# Patient Record
Sex: Female | Born: 1955 | Race: White | Hispanic: No | Smoking: Never smoker
Health system: Southern US, Community
[De-identification: ages and names within clinical notes are randomized; demographics above are authoritative.]

## PROBLEM LIST (undated history)

## (undated) DIAGNOSIS — F32A Depression, unspecified: Secondary | ICD-10-CM

## (undated) DIAGNOSIS — F419 Anxiety disorder, unspecified: Secondary | ICD-10-CM

## (undated) DIAGNOSIS — E039 Hypothyroidism, unspecified: Secondary | ICD-10-CM

## (undated) DIAGNOSIS — R0989 Other specified symptoms and signs involving the circulatory and respiratory systems: Secondary | ICD-10-CM

## (undated) DIAGNOSIS — K759 Inflammatory liver disease, unspecified: Secondary | ICD-10-CM

## (undated) DIAGNOSIS — M109 Gout, unspecified: Secondary | ICD-10-CM

## (undated) DIAGNOSIS — F329 Major depressive disorder, single episode, unspecified: Secondary | ICD-10-CM

## (undated) DIAGNOSIS — R7303 Prediabetes: Secondary | ICD-10-CM

## (undated) DIAGNOSIS — E079 Disorder of thyroid, unspecified: Secondary | ICD-10-CM

## (undated) DIAGNOSIS — F99 Mental disorder, not otherwise specified: Secondary | ICD-10-CM

## (undated) HISTORY — PX: ROTATOR CUFF REPAIR: SHX139

## (undated) HISTORY — DX: Other specified symptoms and signs involving the circulatory and respiratory systems: R09.89

## (undated) HISTORY — DX: Prediabetes: R73.03

## (undated) HISTORY — PX: OTHER SURGICAL HISTORY: SHX169

## (undated) HISTORY — PX: COLONOSCOPY: SHX174

## (undated) HISTORY — DX: Gout, unspecified: M10.9

---

## 1997-12-08 ENCOUNTER — Ambulatory Visit (HOSPITAL_COMMUNITY): Admission: RE | Admit: 1997-12-08 | Discharge: 1997-12-08 | Payer: Self-pay | Admitting: Obstetrics and Gynecology

## 1997-12-14 ENCOUNTER — Ambulatory Visit (HOSPITAL_COMMUNITY): Admission: RE | Admit: 1997-12-14 | Discharge: 1997-12-14 | Payer: Self-pay | Admitting: Obstetrics and Gynecology

## 1998-04-06 ENCOUNTER — Other Ambulatory Visit: Admission: RE | Admit: 1998-04-06 | Discharge: 1998-04-06 | Payer: Self-pay | Admitting: Internal Medicine

## 2000-02-13 ENCOUNTER — Other Ambulatory Visit: Admission: RE | Admit: 2000-02-13 | Discharge: 2000-02-13 | Payer: Self-pay | Admitting: Internal Medicine

## 2000-03-05 ENCOUNTER — Encounter: Payer: Self-pay | Admitting: Internal Medicine

## 2000-03-05 ENCOUNTER — Ambulatory Visit (HOSPITAL_COMMUNITY): Admission: RE | Admit: 2000-03-05 | Discharge: 2000-03-05 | Payer: Self-pay | Admitting: Internal Medicine

## 2001-05-12 ENCOUNTER — Encounter: Admission: RE | Admit: 2001-05-12 | Discharge: 2001-08-10 | Payer: Self-pay | Admitting: Internal Medicine

## 2004-07-04 ENCOUNTER — Other Ambulatory Visit: Admission: RE | Admit: 2004-07-04 | Discharge: 2004-07-04 | Payer: Self-pay | Admitting: Internal Medicine

## 2005-01-18 ENCOUNTER — Emergency Department (HOSPITAL_COMMUNITY): Admission: EM | Admit: 2005-01-18 | Discharge: 2005-01-18 | Payer: Self-pay | Admitting: Emergency Medicine

## 2005-07-11 ENCOUNTER — Encounter: Admission: RE | Admit: 2005-07-11 | Discharge: 2005-09-30 | Payer: Self-pay | Admitting: Orthopaedic Surgery

## 2005-10-23 ENCOUNTER — Emergency Department (HOSPITAL_COMMUNITY): Admission: EM | Admit: 2005-10-23 | Discharge: 2005-10-24 | Payer: Self-pay | Admitting: Emergency Medicine

## 2006-05-08 ENCOUNTER — Inpatient Hospital Stay (HOSPITAL_COMMUNITY): Admission: RE | Admit: 2006-05-08 | Discharge: 2006-05-13 | Payer: Self-pay | Admitting: Orthopedic Surgery

## 2006-05-13 ENCOUNTER — Emergency Department (HOSPITAL_COMMUNITY): Admission: EM | Admit: 2006-05-13 | Discharge: 2006-05-14 | Payer: Self-pay | Admitting: Emergency Medicine

## 2006-05-28 ENCOUNTER — Encounter: Admission: RE | Admit: 2006-05-28 | Discharge: 2006-08-26 | Payer: Self-pay | Admitting: Orthopedic Surgery

## 2006-08-27 ENCOUNTER — Encounter: Admission: RE | Admit: 2006-08-27 | Discharge: 2006-11-20 | Payer: Self-pay | Admitting: Orthopedic Surgery

## 2006-09-03 ENCOUNTER — Encounter: Admission: RE | Admit: 2006-09-03 | Discharge: 2006-09-03 | Payer: Self-pay | Admitting: Internal Medicine

## 2010-03-06 ENCOUNTER — Emergency Department (HOSPITAL_COMMUNITY): Admission: EM | Admit: 2010-03-06 | Discharge: 2010-03-06 | Payer: Self-pay | Admitting: Family Medicine

## 2010-03-08 ENCOUNTER — Emergency Department (HOSPITAL_COMMUNITY): Admission: EM | Admit: 2010-03-08 | Discharge: 2010-03-08 | Payer: Self-pay | Admitting: Emergency Medicine

## 2010-03-13 ENCOUNTER — Emergency Department (HOSPITAL_COMMUNITY): Admission: EM | Admit: 2010-03-13 | Discharge: 2010-03-13 | Payer: Self-pay | Admitting: Emergency Medicine

## 2010-05-31 ENCOUNTER — Encounter: Admission: RE | Admit: 2010-05-31 | Discharge: 2010-05-31 | Payer: Self-pay | Admitting: Internal Medicine
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
## 2011-05-10 ENCOUNTER — Encounter: Payer: Self-pay | Admitting: Gastroenterology

## 2011-05-25 DIAGNOSIS — L738 Other specified follicular disorders: Secondary | ICD-10-CM | POA: Insufficient documentation

## 2011-05-25 DIAGNOSIS — R21 Rash and other nonspecific skin eruption: Secondary | ICD-10-CM | POA: Insufficient documentation

## 2011-05-25 DIAGNOSIS — F29 Unspecified psychosis not due to a substance or known physiological condition: Secondary | ICD-10-CM | POA: Insufficient documentation

## 2011-05-25 DIAGNOSIS — E039 Hypothyroidism, unspecified: Secondary | ICD-10-CM | POA: Insufficient documentation

## 2011-06-01 ENCOUNTER — Emergency Department (HOSPITAL_COMMUNITY)
Admission: EM | Admit: 2011-06-01 | Discharge: 2011-06-04 | Disposition: A | Discharge status: Other Acute Inpatient Hospital | Payer: Medicaid Other | Source: Home / Self Care | Attending: Emergency Medicine | Admitting: Emergency Medicine

## 2011-06-01 LAB — COMPREHENSIVE METABOLIC PANEL
BUN: 20 mg/dL (ref 6–23)
CO2: 22 mEq/L (ref 19–32)
Chloride: 106 mEq/L (ref 96–112)
Creatinine, Ser: 0.78 mg/dL (ref 0.50–1.10)
GFR calc Af Amer: >60 mL/min (ref >60)
GFR calc non Af Amer: >60 mL/min (ref >60)
Total Protein: 7.5 g/dL (ref 6.0–8.3)

## 2011-06-01 LAB — URINALYSIS, ROUTINE W REFLEX MICROSCOPIC
Hgb urine dipstick: NEGATIVE
Nitrite: NEGATIVE

## 2011-06-01 LAB — CBC
HCT: 34.6 % — ABNORMAL LOW (ref 36.0–46.0)
MCH: 28.8 pg (ref 26.0–34.0)
MCHC: 34.1 g/dL (ref 30.0–36.0)
MCV: 84.4 fL (ref 78.0–100.0)

## 2011-06-01 LAB — DIFFERENTIAL
Basophils Absolute: 0.1 10*3/uL (ref 0.0–0.1)
Lymphs Abs: 2.3 10*3/uL (ref 0.7–4.0)
Monocytes Absolute: 0.5 10*3/uL (ref 0.1–1.0)
Monocytes Relative: 8 % (ref 3–12)
Neutrophils Relative %: 52 % (ref 43–77)

## 2011-06-01 LAB — RAPID URINE DRUG SCREEN, HOSP PERFORMED
Amphetamines: POSITIVE — AB
Barbiturates: NOT DETECTED
Benzodiazepines: POSITIVE — AB
Cocaine: NOT DETECTED
Opiates: NOT DETECTED

## 2011-06-01 LAB — ETHANOL: Alcohol, Ethyl (B): <11 mg/dL (ref 0–11)

## 2011-06-04 ENCOUNTER — Inpatient Hospital Stay (HOSPITAL_COMMUNITY)
Admission: AD | Admit: 2011-06-04 | Discharge: 2011-06-10 | DRG: 885 | Disposition: A | Discharge status: Home / Self Care | Payer: Medicaid Other | Attending: Psychiatry | Admitting: Psychiatry

## 2011-06-04 DIAGNOSIS — Z882 Allergy status to sulfonamides status: Secondary | ICD-10-CM

## 2011-06-04 DIAGNOSIS — F411 Generalized anxiety disorder: Secondary | ICD-10-CM

## 2011-06-04 DIAGNOSIS — F323 Major depressive disorder, single episode, severe with psychotic features: Principal | ICD-10-CM

## 2011-06-04 DIAGNOSIS — L738 Other specified follicular disorders: Secondary | ICD-10-CM

## 2011-06-04 DIAGNOSIS — E039 Hypothyroidism, unspecified: Secondary | ICD-10-CM

## 2011-06-04 DIAGNOSIS — R21 Rash and other nonspecific skin eruption: Secondary | ICD-10-CM

## 2011-06-04 DIAGNOSIS — Z79899 Other long term (current) drug therapy: Secondary | ICD-10-CM

## 2011-06-05 DIAGNOSIS — F333 Major depressive disorder, recurrent, severe with psychotic symptoms: Secondary | ICD-10-CM

## 2011-06-06 LAB — LIPID PANEL
HDL: 48 mg/dL (ref >39)
LDL Cholesterol: 58 mg/dL (ref 0–99)
Total CHOL/HDL Ratio: 2.9 RATIO

## 2011-06-10 LAB — WOUND CULTURE
Culture: NO GROWTH
Culture: NO GROWTH
Gram Stain: NONE SEEN

## 2011-06-11 NOTE — Discharge Summary (Signed)
  Rebekah Pace, Rebekah Pace                 ACCOUNT NO.:  [id number]  MEDICAL RECORD NO.:  [id number]  LOCATION:  0405                          [facility]  PHYSICIAN:  Eulogio Ditch, MD DATE OF BIRTH:  10/04/1956  DATE OF ADMISSION:  06/04/2011 DATE OF DISCHARGE:  06/10/2011                              DISCHARGE SUMMARY   HISTORY OF PRESENT ILLNESS:  Please see the initial psych assessment for details.  Briefly, a 55 year old Caucasian female who was admitted as she was having delusional thinking that her body was infected with MRSA and she would Lysol on her body and then started taking shower multiple times.  HOSPITAL COURSE:  During the hospital stay, patient was continued on her medications and her Zoloft was increased from 150 to 200 mg and was given trazodone 150 mg at bedtime.  Patient was continued on Xanax 1 mg 4 times a day.  Patient responded to the combination well without any side effects.  To treat her delusional thinking, patient was started on Risperdal 0.5 mg twice a day.  By June 10, 2011, patient is very logical and goal directed, not suicidal or homicidal, not hallucinating or delusional.  Patient's insight improved into her delusional thinking. Patient's family was called and they did not have any safety concerns. Patient participated in all the groups and no agitation reported by the staff.  Patient also signed a 72-hour letter to be discharged as patient was doing well and there was no safety risk present.  Patient was discharged to follow up in the outpatient setting.  DISCHARGE MEDICATIONS: 1. Levothyroxine 7.5 mg daily. 2. Zoloft 200 mg p.o. daily. 3. Trazodone 150 at bedtime. 4. Xanax 1 mg 4 times a day. 5. Risperdal 0.5 q.a.m. and q.h.s.  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 AXIS II:  Deferred. AXIS III:  Hypothyroidism. AXIS IV:  Chronic mental health issues and further in the hospital for  a long period of time. AXIS V:  60.  DISCHARGE FOLLOWUP:  Patient is going to follow up with Dr. Jamas Lav at [facility], phone number [phone], appointment Tuesday, June 11, 2011, at 10:15.     Eulogio Ditch, MD    SA/MEDQ  D:  06/10/2011  T:  06/10/2011  Job:  [id number]  Electronically Signed by Eulogio Ditch  on 06/11/2011 01:58:36 PM
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
## 2011-07-31 ENCOUNTER — Emergency Department (HOSPITAL_COMMUNITY)
Admission: EM | Admit: 2011-07-31 | Discharge: 2011-08-01 | Disposition: A | Discharge status: Home / Self Care | Payer: Self-pay | Attending: Emergency Medicine | Admitting: Emergency Medicine

## 2011-08-01 ENCOUNTER — Emergency Department (HOSPITAL_BASED_OUTPATIENT_CLINIC_OR_DEPARTMENT_OTHER)
Admission: EM | Admit: 2011-08-01 | Discharge: 2011-08-01 | Disposition: A | Discharge status: Home / Self Care | Payer: Medicaid Other | Attending: Emergency Medicine | Admitting: Emergency Medicine

## 2011-08-01 ENCOUNTER — Encounter: Payer: Self-pay | Admitting: *Deleted

## 2011-08-01 DIAGNOSIS — E079 Disorder of thyroid, unspecified: Secondary | ICD-10-CM | POA: Insufficient documentation

## 2011-08-01 DIAGNOSIS — Z79899 Other long term (current) drug therapy: Secondary | ICD-10-CM | POA: Insufficient documentation

## 2011-08-01 DIAGNOSIS — L089 Local infection of the skin and subcutaneous tissue, unspecified: Secondary | ICD-10-CM

## 2011-08-01 DIAGNOSIS — F424 Excoriation (skin-picking) disorder: Secondary | ICD-10-CM

## 2011-08-01 HISTORY — DX: Disorder of thyroid, unspecified: E07.9

## 2011-08-01 LAB — DIFFERENTIAL
Basophils Absolute: 0.1 10*3/uL (ref 0.0–0.1)
Eosinophils Absolute: 0.6 10*3/uL (ref 0.0–0.7)
Eosinophils Relative: 7 % — ABNORMAL HIGH (ref 0–5)
Lymphs Abs: 3.4 10*3/uL (ref 0.7–4.0)
Neutro Abs: 3.5 10*3/uL (ref 1.7–7.7)

## 2011-08-01 LAB — RAPID URINE DRUG SCREEN, HOSP PERFORMED: Benzodiazepines: POSITIVE — AB

## 2011-08-01 LAB — CBC
MCH: 28.7 pg (ref 26.0–34.0)
MCHC: 34.3 g/dL (ref 30.0–36.0)
RBC: 4.5 MIL/uL (ref 3.87–5.11)

## 2011-08-01 LAB — COMPREHENSIVE METABOLIC PANEL
ALT: 57 U/L — ABNORMAL HIGH (ref 0–35)
AST: 68 U/L — ABNORMAL HIGH (ref 0–37)
Albumin: 4.1 g/dL (ref 3.5–5.2)
Alkaline Phosphatase: 97 U/L (ref 39–117)
BUN: 12 mg/dL (ref 6–23)
CO2: 25 mEq/L (ref 19–32)
Calcium: 9.2 mg/dL (ref 8.4–10.5)
Creatinine, Ser: 0.7 mg/dL (ref 0.50–1.10)
GFR calc non Af Amer: >90 mL/min (ref >90)
Sodium: 140 mEq/L (ref 135–145)

## 2011-08-01 MED ORDER — DOXYCYCLINE HYCLATE 100 MG PO CAPS: 100.0000 mg | ORAL_CAPSULE | Freq: Two times a day (BID) | ORAL | Status: AC
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
## 2011-08-01 NOTE — ED Provider Notes (Signed)
History     CSN: [id number] Arrival date & time: 08/01/2011  5:33 PM   First MD Initiated Contact with Patient 08/01/11 1738      Chief Complaint  Patient presents with  . Skin Problem    (Consider location/radiation/quality/duration/timing/severity/associated sxs/prior treatment) Patient is a 55 y.o. female presenting with rash. The history is provided by the patient. No language interpreter was used.  Rash  This is a recurrent problem. The current episode started more than 1 week ago. The problem has been gradually worsening. The problem is associated with an unknown factor. There has been no fever. The rash is present on the abdomen, right lower leg, right upper leg, left upper leg and left lower leg. The patient is experiencing no pain. The pain has been constant since onset. Associated symptoms include itching. She has tried nothing for the symptoms. The treatment provided moderate relief.  Pt reports she has a rash on legs.  (Pt reports she has multiple sore areas.  Pt reports she frequently sees oozing from the areas.)  Dr. York Ram called because she is concerned that pt is psychotic.  Pt picks at skin continously,  She pulls out the hairs from her arms and legs during history and exam.  (Pt reports after she pulls out hairs fluid drains out. )  Pt reports her skin is always wet.    Past Medical History  Diagnosis Date  . Thyroid disease     Past Surgical History  Procedure Date  . Arm fracture   . Rotator cuff repair     No family history on file.  History  Substance Use Topics  . Smoking status: Never Smoker   . Smokeless tobacco: Not on file  . Alcohol Use: Yes    OB History    Grav Para Term Preterm Abortions TAB SAB Ect Mult Living                  Review of Systems  Skin: Positive for itching and rash.  Psychiatric/Behavioral: Negative for suicidal ideas, hallucinations and self-injury.  All other systems reviewed and are negative.    Allergies    Sulfa antibiotics  Home Medications   Current Outpatient Rx  Name Route Sig Dispense Refill  . ALPRAZOLAM 1 MG PO TABS Oral Take 1 mg by mouth at bedtime as needed. For anxiety     . VITAMIN B 12 PO Oral Take 1 tablet by mouth daily.      Marland Kitchen LEVOTHYROXINE SODIUM 75 MCG PO TABS Oral Take 75 mcg by mouth daily.      . SERTRALINE HCL 100 MG PO TABS Oral Take 200 mg by mouth daily.      . TRAZODONE HCL 100 MG PO TABS Oral Take 200 mg by mouth daily as needed. For anxiety       BP 122/78  Pulse 84  Temp(Src) 98.4 F (36.9 C) (Oral)  Resp 18  SpO2 99%  Physical Exam  Nursing note and vitals reviewed. Constitutional: She is oriented to person, place, and time. She appears well-developed and well-nourished.  HENT:  Head: Normocephalic and atraumatic.  Right Ear: External ear normal.  Mouth/Throat: Oropharynx is clear and moist.  Eyes: Conjunctivae and EOM are normal. Pupils are equal, round, and reactive to light.  Neck: Normal range of motion. Neck supple.  Cardiovascular: Normal rate.   Pulmonary/Chest: Effort normal.  Abdominal: Soft.  Musculoskeletal: Normal range of motion.  Neurological: She is alert and oriented to person, place, and  time.  Skin: Rash noted.  Psychiatric:       Pt  Denies suicidal or homicidal thougts.    ED Course  Procedures (including critical care time)  Labs Reviewed  DIFFERENTIAL - Abnormal; Notable for the following:    Neutrophils Relative 42 (*)    Eosinophils Relative 7 (*)    All other components within normal limits  COMPREHENSIVE METABOLIC PANEL - Abnormal; Notable for the following:    AST 68 (*)    ALT 57 (*)    All other components within normal limits  URINE RAPID DRUG SCREEN (HOSP PERFORMED) - Abnormal; Notable for the following:    Benzodiazepines POSITIVE (*)    All other components within normal limits  CBC  ETHANOL  TSH   No results found.   No diagnosis found.    MDM  Pt positive for benzo's which are  prescribed.  Thyroid studies are pending.    Pt had a hospitilization in August after picking at skin and presentling at ED at [facility] with psychotic symptoms.  Pt gives a long history about brothers illness and being stressed.  Pt reports she lives with brother and son.  Pt reports she is safe.  She denies any thoughts of self harm,  Denies any thoughts of harming anyone.  Pt feels she is able to care for herself and her brother effectively.  Pt reports she did follow up with Dr. Raquel James and that she is taking psychiatric medications as prescribed.  Pt reports she will see Dr. Raquel James in 2 weeks. I advised pt that the only skin problem that I note is that she is making sores on her skin from picking at areas.  Pt may be getting skin infection from picking at areas.    Pt reports this is all she wants to be treated for.  Pt does not want to talk to Psychiatry,  Pt refuses Psychiatric assessment.   (Pt is calm, she feels like she did not need previous admission,  Pt agrees to return to ED for further evaluation if symptoms worsen.)  Pt stops picking at skin when I advised her to do so.    Currently  Pt does not admit criteria for involuntary commitment.         Langston Masker, [place] 08/01/11 2006

## 2011-08-01 NOTE — ED Notes (Signed)
Pt c/o her skin "excretes water that turns to oil" x1 month. Pt was seen by her PMD and at an "ER in [place]" for the same with no resolution.

## 2011-08-01 NOTE — ED Notes (Signed)
During triage, pt denied SI/HI/AH/VH. Pt is clean and well kept and sts she drove herself to the ER today.

## 2011-08-01 NOTE — ED Provider Notes (Signed)
Medical screening examination/treatment/procedure(s) were performed by non-physician practitioner and as supervising physician I was immediately available for consultation/collaboration.   Gokul Waybright R Rockell Faulks, MD 08/01/11 2157 

## 2011-08-02 LAB — TSH: TSH: 0.736 u[IU]/mL (ref 0.350–4.500)

## 2011-08-05 NOTE — ED Notes (Signed)
Pt appeared in dept. Upset "about the wording of her visit to ED". Pt given emotional support and encouraged to contact Ophelia Shoulder, Interior and spatial designer.

## 2011-09-11 ENCOUNTER — Encounter (HOSPITAL_COMMUNITY): Payer: Self-pay | Admitting: *Deleted

## 2011-09-11 ENCOUNTER — Emergency Department (EMERGENCY_DEPARTMENT_HOSPITAL)
Admission: EM | Admit: 2011-09-11 | Discharge: 2011-09-12 | Disposition: A | Discharge status: Other Acute Inpatient Hospital | Payer: Medicaid Other | Source: Home / Self Care | Attending: Emergency Medicine | Admitting: Emergency Medicine

## 2011-09-11 DIAGNOSIS — F22 Delusional disorders: Secondary | ICD-10-CM

## 2011-09-11 DIAGNOSIS — E079 Disorder of thyroid, unspecified: Secondary | ICD-10-CM | POA: Insufficient documentation

## 2011-09-11 LAB — RAPID URINE DRUG SCREEN, HOSP PERFORMED
Amphetamines: NOT DETECTED
Barbiturates: NOT DETECTED
Benzodiazepines: NOT DETECTED
Cocaine: NOT DETECTED
Opiates: NOT DETECTED
Tetrahydrocannabinol: NOT DETECTED

## 2011-09-11 LAB — COMPREHENSIVE METABOLIC PANEL
ALT: 58 U/L — ABNORMAL HIGH (ref 0–35)
AST: 62 U/L — ABNORMAL HIGH (ref 0–37)
Albumin: 4 g/dL (ref 3.5–5.2)
Alkaline Phosphatase: 83 U/L (ref 39–117)
BUN: 13 mg/dL (ref 6–23)
CO2: 19 mEq/L (ref 19–32)
Calcium: 9.3 mg/dL (ref 8.4–10.5)
Chloride: 96 mEq/L (ref 96–112)
Creatinine, Ser: 0.8 mg/dL (ref 0.50–1.10)
GFR calc Af Amer: >90 mL/min (ref >90)
GFR calc non Af Amer: 81 mL/min — ABNORMAL LOW (ref >90)
Glucose, Bld: 138 mg/dL — ABNORMAL HIGH (ref 70–99)
Potassium: 3 mEq/L — ABNORMAL LOW (ref 3.5–5.1)
Sodium: 133 mEq/L — ABNORMAL LOW (ref 135–145)
Total Bilirubin: 0.7 mg/dL (ref 0.3–1.2)
Total Protein: 7.4 g/dL (ref 6.0–8.3)

## 2011-09-11 LAB — DIFFERENTIAL
Basophils Absolute: 0 10*3/uL (ref 0.0–0.1)
Basophils Relative: 1 % (ref 0–1)
Eosinophils Absolute: 0.1 10*3/uL (ref 0.0–0.7)
Eosinophils Relative: 2 % (ref 0–5)
Lymphocytes Relative: 28 % (ref 12–46)
Lymphs Abs: 1.7 10*3/uL (ref 0.7–4.0)
Monocytes Absolute: 0.4 10*3/uL (ref 0.1–1.0)
Monocytes Relative: 7 % (ref 3–12)
Neutro Abs: 3.8 10*3/uL (ref 1.7–7.7)
Neutrophils Relative %: 63 % (ref 43–77)

## 2011-09-11 LAB — CBC
HCT: 38.4 % (ref 36.0–46.0)
Hemoglobin: 13.1 g/dL (ref 12.0–15.0)
MCH: 28.8 pg (ref 26.0–34.0)
MCHC: 34.1 g/dL (ref 30.0–36.0)
MCV: 84.4 fL (ref 78.0–100.0)
Platelets: 214 10*3/uL (ref 150–400)
RBC: 4.55 MIL/uL (ref 3.87–5.11)
RDW: 13.3 % (ref 11.5–15.5)
WBC: 6 10*3/uL (ref 4.0–10.5)

## 2011-09-11 LAB — ETHANOL: Alcohol, Ethyl (B): <11 mg/dL (ref 0–11)

## 2011-09-11 MED ORDER — ALPRAZOLAM 1 MG PO TABS
1.0000 mg | ORAL_TABLET | Freq: Every day | ORAL | Status: DC | PRN
  Administered 2011-09-12: 1 mg via ORAL
  Filled 2011-09-11: qty 1

## 2011-09-11 MED ORDER — ONDANSETRON HCL 4 MG PO TABS: 4.0000 mg | ORAL_TABLET | Freq: Three times a day (TID) | ORAL | Status: DC | PRN

## 2011-09-11 MED ORDER — TRAZODONE HCL 100 MG PO TABS
200.0000 mg | ORAL_TABLET | Freq: Every evening | ORAL | Status: DC | PRN
  Administered 2011-09-11: 200 mg via ORAL
  Filled 2011-09-11: qty 2

## 2011-09-11 MED ORDER — ZIPRASIDONE MESYLATE 20 MG IM SOLR
20.0000 mg | Freq: Once | INTRAMUSCULAR | Status: AC
  Administered 2011-09-11: 20 mg via INTRAMUSCULAR
  Filled 2011-09-11: qty 20

## 2011-09-11 MED ORDER — MUPIROCIN CALCIUM 2 % EX CREA: TOPICAL_CREAM | Freq: Three times a day (TID) | CUTANEOUS | Status: DC

## 2011-09-11 MED ORDER — CEPHALEXIN 500 MG PO CAPS: 500.0000 mg | ORAL_CAPSULE | Freq: Four times a day (QID) | ORAL | Status: DC

## 2011-09-11 MED ORDER — SERTRALINE HCL 50 MG PO TABS
200.0000 mg | ORAL_TABLET | Freq: Every day | ORAL | Status: DC
  Administered 2011-09-11 – 2011-09-12 (×2): 200 mg via ORAL
  Filled 2011-09-11 (×4): qty 4

## 2011-09-11 MED ORDER — IBUPROFEN 600 MG PO TABS
600.0000 mg | ORAL_TABLET | Freq: Three times a day (TID) | ORAL | Status: DC | PRN
  Administered 2011-09-12: 600 mg via ORAL
  Filled 2011-09-11: qty 1

## 2011-09-11 MED ORDER — LEVOTHYROXINE SODIUM 75 MCG PO TABS
75.0000 ug | ORAL_TABLET | Freq: Every day | ORAL | Status: DC
  Administered 2011-09-12: 75 ug via ORAL
  Filled 2011-09-11: qty 1

## 2011-09-11 MED ORDER — ZOLPIDEM TARTRATE 5 MG PO TABS: 5.0000 mg | ORAL_TABLET | Freq: Every evening | ORAL | Status: DC | PRN

## 2011-09-11 MED ORDER — HYDROXYZINE HCL 25 MG PO TABS: 25.0000 mg | ORAL_TABLET | Freq: Four times a day (QID) | ORAL | Status: DC

## 2011-09-11 MED ORDER — LEVOTHYROXINE SODIUM 75 MCG PO TABS: 75.0000 ug | ORAL_TABLET | Freq: Every day | ORAL | Status: DC

## 2011-09-11 MED ORDER — ALUM & MAG HYDROXIDE-SIMETH 200-200-20 MG/5ML PO SUSP: 30.0000 mL | ORAL | Status: DC | PRN

## 2011-09-11 NOTE — ED Notes (Signed)
Report called to Kathlene November, RN to move pt to Psych Rm 43.

## 2011-09-11 NOTE — ED Notes (Signed)
Pt out to the desk to use the phone.

## 2011-09-11 NOTE — ED Notes (Signed)
Pt states she "has been having fluid coming out of her skin for several days and she is concerned that she will not have anymore body oil in her and her skin will dry up.  Says she is being treated by medical Dr for this."  Pt denies SI/HI.

## 2011-09-11 NOTE — ED Provider Notes (Signed)
Home meds  ordered.  Cyndra Numbers, MD 09/11/11 2145
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
## 2011-09-11 NOTE — ED Notes (Signed)
Per EMS- over the last 2 days patient states she has been worried about this fluid that has been building up in her body and is now leaking out of her pores and creating a film on her skin, pt skin warm and dry, pt states she feels like her skin is tight, pt appears obsessed with skin. Pt states she soaked in the bath tub and scrubbed her skin with lysol and has bathed many times to try to get this "film" off her skin.
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
## 2011-09-12 ENCOUNTER — Inpatient Hospital Stay (HOSPITAL_COMMUNITY)
Admission: AD | Admit: 2011-09-12 | Discharge: 2011-09-16 | DRG: 885 | Disposition: A | Discharge status: Home / Self Care | Payer: Medicaid Other | Source: Ambulatory Visit | Attending: Psychiatry | Admitting: Psychiatry

## 2011-09-12 DIAGNOSIS — F259 Schizoaffective disorder, unspecified: Principal | ICD-10-CM

## 2011-09-12 DIAGNOSIS — F319 Bipolar disorder, unspecified: Secondary | ICD-10-CM

## 2011-09-12 DIAGNOSIS — F250 Schizoaffective disorder, bipolar type: Secondary | ICD-10-CM

## 2011-09-12 DIAGNOSIS — Z882 Allergy status to sulfonamides status: Secondary | ICD-10-CM

## 2011-09-12 DIAGNOSIS — E039 Hypothyroidism, unspecified: Secondary | ICD-10-CM

## 2011-09-12 DIAGNOSIS — B1920 Unspecified viral hepatitis C without hepatic coma: Secondary | ICD-10-CM

## 2011-09-12 DIAGNOSIS — F22 Delusional disorders: Secondary | ICD-10-CM

## 2011-09-12 DIAGNOSIS — F29 Unspecified psychosis not due to a substance or known physiological condition: Secondary | ICD-10-CM

## 2011-09-12 DIAGNOSIS — R21 Rash and other nonspecific skin eruption: Secondary | ICD-10-CM

## 2011-09-12 DIAGNOSIS — F411 Generalized anxiety disorder: Secondary | ICD-10-CM

## 2011-09-12 MED ORDER — MAGNESIUM HYDROXIDE 400 MG/5ML PO SUSP: 30.0000 mL | Freq: Every day | ORAL | Status: DC | PRN

## 2011-09-12 MED ORDER — POTASSIUM CHLORIDE CRYS ER 20 MEQ PO TBCR
20.0000 meq | EXTENDED_RELEASE_TABLET | Freq: Two times a day (BID) | ORAL | Status: AC
  Administered 2011-09-12 – 2011-09-14 (×5): 20 meq via ORAL
  Filled 2011-09-12 (×5): qty 1

## 2011-09-12 MED ORDER — ALUM & MAG HYDROXIDE-SIMETH 200-200-20 MG/5ML PO SUSP: 30.0000 mL | ORAL | Status: DC | PRN

## 2011-09-12 MED ORDER — ACETAMINOPHEN 325 MG PO TABS: 650.0000 mg | ORAL_TABLET | Freq: Four times a day (QID) | ORAL | Status: DC | PRN

## 2011-09-12 MED ORDER — LEVOTHYROXINE SODIUM 75 MCG PO TABS
75.0000 ug | ORAL_TABLET | Freq: Every day | ORAL | Status: DC
  Administered 2011-09-12 – 2011-09-16 (×5): 75 ug via ORAL
  Filled 2011-09-12 (×7): qty 1

## 2011-09-12 MED ORDER — HYDROXYZINE HCL 25 MG PO TABS: 25.0000 mg | ORAL_TABLET | Freq: Four times a day (QID) | ORAL | Status: DC | PRN

## 2011-09-12 MED ORDER — CEPHALEXIN 500 MG PO CAPS
500.0000 mg | ORAL_CAPSULE | Freq: Four times a day (QID) | ORAL | Status: DC
  Administered 2011-09-12 – 2011-09-16 (×16): 500 mg via ORAL
  Filled 2011-09-12 (×2): qty 1
  Filled 2011-09-12: qty 15
  Filled 2011-09-12 (×6): qty 1
  Filled 2011-09-12: qty 15
  Filled 2011-09-12 (×5): qty 1
  Filled 2011-09-12 (×2): qty 15
  Filled 2011-09-12 (×6): qty 1

## 2011-09-12 MED ORDER — MUPIROCIN CALCIUM 2 % EX CREA
TOPICAL_CREAM | Freq: Three times a day (TID) | CUTANEOUS | Status: DC
  Administered 2011-09-13: 1 via TOPICAL
  Administered 2011-09-13 – 2011-09-15 (×2): via TOPICAL
  Filled 2011-09-12: qty 15

## 2011-09-12 MED ORDER — RISPERIDONE 1 MG PO TABS
1.0000 mg | ORAL_TABLET | Freq: Two times a day (BID) | ORAL | Status: DC
  Filled 2011-09-12: qty 1

## 2011-09-12 MED ORDER — VITAMIN B-12 100 MCG PO TABS
100.0000 ug | ORAL_TABLET | Freq: Every day | ORAL | Status: DC
  Administered 2011-09-12 – 2011-09-16 (×5): 100 ug via ORAL
  Filled 2011-09-12 (×7): qty 1

## 2011-09-12 MED ORDER — SERTRALINE HCL 100 MG PO TABS
200.0000 mg | ORAL_TABLET | Freq: Every day | ORAL | Status: DC
  Administered 2011-09-13 – 2011-09-16 (×4): 200 mg via ORAL
  Filled 2011-09-12: qty 1
  Filled 2011-09-12: qty 2
  Filled 2011-09-12: qty 20
  Filled 2011-09-12 (×4): qty 2

## 2011-09-12 MED ORDER — RISPERIDONE 0.5 MG PO TBDP
0.5000 mg | ORAL_TABLET | ORAL | Status: DC
  Filled 2011-09-12 (×5): qty 1

## 2011-09-12 MED ORDER — TRAZODONE HCL 100 MG PO TABS
200.0000 mg | ORAL_TABLET | Freq: Every evening | ORAL | Status: DC | PRN
  Administered 2011-09-13: 200 mg via ORAL
  Filled 2011-09-12: qty 2

## 2011-09-12 MED ORDER — ALPRAZOLAM 1 MG PO TABS
1.0000 mg | ORAL_TABLET | Freq: Every day | ORAL | Status: DC | PRN
  Administered 2011-09-13: 1 mg via ORAL
  Filled 2011-09-12: qty 1

## 2011-09-12 NOTE — Progress Notes (Signed)
Patient ID: Rebekah Pace, female   DOB: 1956/03/28, 55 y.o.   MRN: [id number] Pt admitted on involuntary basis. Pt presents as stating that she had some leaking from her skin, and notes her abdominal area when mentioning this. Pt reports having presented to the ED because of this and then stated that she was brought to the [facility] facility. Pt did state on admission that she wanted to receive medical attention for her various ailments and feels there is nothing wrong with her psychiatrically, pt did state that she was recently diagnosed with hepatitis C, also mentioned about her thyroid condition and blood pressure as other reasons as to what is wrong with her medically.
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
## 2011-09-12 NOTE — H&P (Signed)
Rebekah Pace is a 55 y.o. female.    Chief Complaint:   55 yo committed after delusional thoughts of oil pouring out of her skin and her skin falling off.  She is an involuntary admission to the services of Dr. Allena Katz.  HPI:  Pt called 911 for a 'panic' attack at home, she wanted to be reassured that is was only a panic attack.  When she was in the ED, she was diagnosed with a skin rash and prescribed meds.  Then, she was committed for delusional/bizarre behaviors and thought processes.  Her PCP has been concerned about her behavior and thought processes for the past month.  The patient is adamant that the hospital is ignoring her medical needs and she does not have any psychiatric needs.  Rebekah Pace is concerned about her skin and the oil being compacted and pouring out of her.  She was constantly itching her skin during her assessment.  Rebekah Pace also said her stomach was tender yesterday with lumps but not today since she had leaked so much fluid (non-tender on palpation).  Past Medical History:  Hypothyroidism, Hepatitis C (diagnosed last week per patient)  Past Surgical History:  Arm fracture and rotator cuff repair in 2007  Social History: She lives with her 76 yo son and her older brother moved in over the past year since he was in a major car accident.  Rebekah Pace states she is close to her brother and her mother who lives close to her.  Her brother is now walking with a cane and returned to his job as a professor at [employer].  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  Allergies:  Sulfa antibiotics (rash)   Medications Prior to Admission  Medication Dose Route Frequency Provider Last Rate Last Dose  . acetaminophen (TYLENOL) tablet 650 mg  650 mg Oral Q6H PRN Franchot Gallo, MD      . alum & mag hydroxide-simeth (MAALOX/MYLANTA) 200-200-20 MG/5ML suspension 30 mL  30 mL Oral Q4H PRN Franchot Gallo, MD      . levothyroxine (SYNTHROID,  LEVOTHROID) tablet 75 mcg  75 mcg Oral Daily Randy Readling, MD      . magnesium hydroxide (MILK OF MAGNESIA) suspension 30 mL  30 mL Oral Daily PRN Franchot Gallo, MD      . potassium chloride SA (K-DUR,KLOR-CON) CR tablet 20 mEq  20 mEq Oral BID Franchot Gallo, MD      . risperiDONE (RISPERDAL M-TABS) disintegrating tablet 0.5 mg  0.5 mg Oral BH-qamhs Randy Readling, MD      . sertraline (ZOLOFT) tablet 200 mg  200 mg Oral Daily Randy Readling, MD      . traZODone (DESYREL) tablet 200 mg  200 mg Oral QHS PRN Franchot Gallo, MD      . vitamin B-12 (CYANOCOBALAMIN) tablet 100 mcg  100 mcg Oral Daily Randy Readling, MD      . ziprasidone (GEODON) injection 20 mg  20 mg Intramuscular Once [employer], PA   20 mg at 09/11/11 0957  . DISCONTD: ALPRAZolam Prudy Feeler) tablet 1 mg  1 mg Oral Daily PRN Cyndra Numbers, MD   1 mg at 09/12/11 1158  . DISCONTD: alum & mag hydroxide-simeth (MAALOX/MYLANTA) 200-200-20 MG/5ML suspension 30 mL  30 mL Oral PRN Carlyle Dolly, PA      . DISCONTD: ibuprofen (ADVIL,MOTRIN) tablet 600 mg  600 mg Oral Q8H PRN Carlyle Dolly,  PA   600 mg at 09/12/11 1158  . DISCONTD: levothyroxine (SYNTHROID, LEVOTHROID) tablet 75 mcg  75 mcg Oral Daily Cyndra Numbers, MD      . DISCONTD: levothyroxine (SYNTHROID, LEVOTHROID) tablet 75 mcg  75 mcg Oral Daily Cyndra Numbers, MD   75 mcg at 09/12/11 0829  . DISCONTD: ondansetron (ZOFRAN) tablet 4 mg  4 mg Oral Q8H PRN Carlyle Dolly, PA      . DISCONTD: risperiDONE (RISPERDAL) tablet 1 mg  1 mg Oral BID Katheren Puller, MD      . DISCONTD: sertraline (ZOLOFT) tablet 200 mg  200 mg Oral Daily Cyndra Numbers, MD   200 mg at 09/12/11 0953  . DISCONTD: traZODone (DESYREL) tablet 200 mg  200 mg Oral QHS PRN Cyndra Numbers, MD   200 mg at 09/11/11 2212  . DISCONTD: zolpidem (AMBIEN) tablet 5 mg  5 mg Oral QHS PRN Carlyle Dolly, PA       Medications Prior to Admission  Medication Sig Dispense Refill  . levothyroxine  (SYNTHROID, LEVOTHROID) 75 MCG tablet Take 75 mcg by mouth daily.        . sertraline (ZOLOFT) 100 MG tablet Take 200 mg by mouth daily.        . traZODone (DESYREL) 100 MG tablet Take 200 mg by mouth at bedtime as needed. For anxiety        Results for orders placed during the hospital encounter of 09/11/11 (from the past 48 hour(s))  ETHANOL     Status: Normal   Collection Time   09/11/11 10:25 AM      Component Value Range Comment   Alcohol, Ethyl (B) <11  0 - 11 (mg/dL)   CBC     Status: Normal   Collection Time   09/11/11 10:25 AM      Component Value Range Comment   WBC 6.0  4.0 - 10.5 (K/uL)    RBC 4.55  3.87 - 5.11 (MIL/uL)    Hemoglobin 13.1  12.0 - 15.0 (g/dL)    HCT 16.1  09.6 - 04.5 (%)    MCV 84.4  78.0 - 100.0 (fL)    MCH 28.8  26.0 - 34.0 (pg)    MCHC 34.1  30.0 - 36.0 (g/dL)    RDW 40.9  81.1 - 91.4 (%)    Platelets 214  150 - 400 (K/uL)   DIFFERENTIAL     Status: Normal   Collection Time   09/11/11 10:25 AM      Component Value Range Comment   Neutrophils Relative 63  43 - 77 (%)    Neutro Abs 3.8  1.7 - 7.7 (K/uL)    Lymphocytes Relative 28  12 - 46 (%)    Lymphs Abs 1.7  0.7 - 4.0 (K/uL)    Monocytes Relative 7  3 - 12 (%)    Monocytes Absolute 0.4  0.1 - 1.0 (K/uL)    Eosinophils Relative 2  0 - 5 (%)    Eosinophils Absolute 0.1  0.0 - 0.7 (K/uL)    Basophils Relative 1  0 - 1 (%)    Basophils Absolute 0.0  0.0 - 0.1 (K/uL)   COMPREHENSIVE METABOLIC PANEL     Status: Abnormal   Collection Time   09/11/11 10:25 AM      Component Value Range Comment   Sodium 133 (*) 135 - 145 (mEq/L)    Potassium 3.0 (*) 3.5 - 5.1 (mEq/L)    Chloride 96  96 -  112 (mEq/L)    CO2 19  19 - 32 (mEq/L)    Glucose, Bld 138 (*) 70 - 99 (mg/dL)    BUN 13  6 - 23 (mg/dL)    Creatinine, Ser 9.60  0.50 - 1.10 (mg/dL)    Calcium 9.3  8.4 - 10.5 (mg/dL)    Total Protein 7.4  6.0 - 8.3 (g/dL)    Albumin 4.0  3.5 - 5.2 (g/dL)    AST 62 (*) 0 - 37 (U/L)    ALT 58 (*) 0 - 35  (U/L)    Alkaline Phosphatase 83  39 - 117 (U/L)    Total Bilirubin 0.7  0.3 - 1.2 (mg/dL)    GFR calc non Af Amer 81 (*) >90 (mL/min)    GFR calc Af Amer >90  >90 (mL/min)   URINE RAPID DRUG SCREEN (HOSP PERFORMED)     Status: Normal   Collection Time   09/11/11 11:18 AM      Component Value Range Comment   Opiates NONE DETECTED  NONE DETECTED     Cocaine NONE DETECTED  NONE DETECTED     Benzodiazepines NONE DETECTED  NONE DETECTED     Amphetamines NONE DETECTED  NONE DETECTED     Tetrahydrocannabinol NONE DETECTED  NONE DETECTED     Barbiturates NONE DETECTED  NONE DETECTED     No results found.  Review of Systems  Constitutional: Positive for fever (pt reports her temperature is typically 97, so 99 for her is high).  HENT: Negative.   Eyes: Negative.   Respiratory: Negative.   Cardiovascular: Negative.   Gastrointestinal: Negative.   Genitourinary: Negative.   Musculoskeletal: Negative.   Skin: Positive for itching and rash (sacral area, buttocks, right hip and upper right leg).  Neurological: Positive for tremors (hands bilaterally, pt stated it was because she is upset).  Endo/Heme/Allergies: Negative.   Psychiatric/Behavioral: The patient is nervous/anxious.    Vital Signs:  T 98.4 P 82 R 18 BP 120/76 Ht: 5'3" Wt: 75.297 kg BMI: 29.5  Physical Exam  Constitutional: She is oriented to person, place, and time. She appears well-developed and well-nourished.  HENT:  Head: Normocephalic.  Eyes: Conjunctivae and EOM are normal. Pupils are equal, round, and reactive to light.  Neck: Normal range of motion. Neck supple.  Cardiovascular: Normal rate, regular rhythm, normal heart sounds and intact distal pulses.   Respiratory: Effort normal and breath sounds normal.  GI: Soft. Bowel sounds are normal. There is tenderness (stated improved greatly since yesterday).  Genitourinary:       Exam deferred  Musculoskeletal: Normal range of motion.  Neurological: She is alert and  oriented to person, place, and time. She has normal reflexes.  Skin: Skin is warm and dry. Rash (sacral area, buttocks, right hip and upper right leg) noted.  Psychiatric:       Hypomanic behavior, obsessed with skin rash and itching, says oils are compacted     Assessment/Plan 1. Labs reviewed and will address low K+ level, elevated glucose, and ALT/AST elevations consistent with new diagnosis of hepatitis C (consult to [facility] in process by her PCP) 2. Address her skin rash and itching, low grade fever 3. Psychiatrist to review medications to address her delusions and bizarre behavior  Nanine Means 09/12/2011, 2:41 PM

## 2011-09-12 NOTE — ED Notes (Signed)
Report given to [facility] at [facility].

## 2011-09-12 NOTE — ED Provider Notes (Signed)
Medical screening examination/treatment/procedure(s) were performed by non-physician practitioner and as supervising physician I was immediately available for consultation/collaboration.   Vida Roller, MD 09/12/11 [phone]

## 2011-09-12 NOTE — H&P (Signed)
Psychiatric Admission Assessment Adult  Patient Identification:  Rebekah Pace Date of Evaluation:  09/12/2011  Chief Complaint:  PSYCHOTIC DISORDER NOS  History of Present Illness:  55 yo with delusions and thought disorders who presented to the ED with a 'panic' attack and skin 'falling off'   Mood Symptoms:  Elevated mood Depression Symptoms:  None noted (Hypo) Manic Symptoms:   Elevated Mood:  yes Irritable Mood: yes Grandiosity:  No Distractibility: yes Labiality of Mood:  Yes Delusions:  Yes Hallucinations:  No Impulsivity:  No Sexually Inappropriate Behavior:  No Financial Extravagance:  No Flight of Ideas:  Yes  Anxiety Symptoms: Excessive Worry:  Yes Panic Symptoms:  No Agoraphobia:  No Obsessive Compulsive: No  Symptoms: None Specific Phobias:  No Social Anxiety:  No  Psychotic Symptoms:  Hallucinations: No Delusions:  Yes Paranoia:  No   Ideas of Reference:  No  PTSD Symptoms: Ever had a traumatic exposure:  Yes Had a traumatic exposure in the last month:  No Re-experiencing:  None Hypervigilance:  No Hyperarousal:  None Avoidance:  None  Traumatic Brain Injury:  None  Past Psychiatric History: Diagnosis:  Depression, anxiety  Hospitalizations:  [facility] x 2  Outpatient Care:  None  Substance Abuse Care:  N/A  Self-Mutilation:  N/A  Suicidal Attempts:  N/A  Violent Behaviors:  N/A    Mental Status Examination/Evaluation: Objective:  Appearance: Fairly Groomed  Patent attorney::  Fair  Speech:  Pressured  Volume:  Normal  Mood:    Affect:  Blunted  Thought Process:  Disorganized  Orientation:  Full  Thought Content: None  Suicidal Thoughts:  No  Homicidal Thoughts:  No  Judgement:  Impaired  Insight:  Lacking  Psychomotor Activity:  Increased  Akathisia:  No  Handed:  Right  AIMS (if indicated):     Assets:  Housing Leisure Time Social Support Transportation   Assessment:    AXIS I Psychosis NOS, Depression,  GAD  AXIS II None  AXIS III Hypothyroidism  AXIS IV Financial concerns, family stress, health issues  AXIS V 40    Lord, Jamison 11/29/20123:17 PM

## 2011-09-12 NOTE — Progress Notes (Signed)
This Clinical research associate has reviewed this note and is in agreement with contents and disposition.  Ileene Hutchinson , MSW, LCSWA 09/12/2011 11:35 AM

## 2011-09-12 NOTE — Consult Note (Signed)
Patient Identification:  Rebekah Pace Date of Evaluation:  09/12/2011   History of Present Illness:  I reviewed psych assessment and  discharge summary from behavioral health in August. 55 year old Caucasian female who reported that her skin is leaking and she is losing fluids from all over the body she thinks there is a lot of fluid in her body and  yesterday she was seeing the fluid coming out from the stomach area. She also said that she can see the oil coming out from her body clearly in the water. She is very stressed out and scared and having panic attacks because of these things happening to her. But she reported that she is not delusional she is not hearing voices nobody believes her but this is real thing going on with her. She has no eye brows and when I asked her whether she pulls her hair  she told me it is because of low  Thyroid.  patient has been noncompliant with her medications in the outpatient followup.  Past Medical History:     Past Medical History  Diagnosis Date  . Thyroid disease        Past Surgical History  Procedure Date  . Arm fracture   . Rotator cuff repair     Allergies:  Allergies  Allergen Reactions  . Sulfa Antibiotics Rash    Current Medications:  Prior to Admission medications   Medication Sig Start Date End Date Taking? Authorizing Provider  ALPRAZolam Prudy Feeler) 1 MG tablet Take 1 mg by mouth daily as needed. For anxiety   Yes Historical Provider, MD  Cyanocobalamin (VITAMIN B 12 PO) Take 1 tablet by mouth daily.     Yes Historical Provider, MD  levothyroxine (SYNTHROID, LEVOTHROID) 75 MCG tablet Take 75 mcg by mouth daily.     Yes Historical Provider, MD  sertraline (ZOLOFT) 100 MG tablet Take 200 mg by mouth daily.     Yes Historical Provider, MD  traZODone (DESYREL) 100 MG tablet Take 200 mg by mouth at bedtime as needed. For anxiety   Yes Historical Provider, MD    Social History:    reports that she has never smoked. She does not have  any smokeless tobacco history on file. She reports that she drinks alcohol. She reports that she does not use illicit drugs.   Diagnoses psychosis NOS  Recommendations:  patient will be admitted to behavioral health for further stabilization. I started the patient on Risperdal 1 mg twice a day. Zoloft 200 mg by mouth daily continued.    Eulogio Ditch, MD

## 2011-09-12 NOTE — ED Provider Notes (Signed)
The patient is alert, cooperative. She will be assessed later this morning. By the psychiatrist.  Flint Melter, MD 09/12/11 [phone]

## 2011-09-12 NOTE — BH Assessment (Signed)
Assessment Note   Rebekah Pace is an 55 y.o. female.   Axis I: Psychotic Disorder NOS Axis II: Deferred Axis III:  Past Medical History  Diagnosis Date  . Thyroid disease    Axis IV: problems with access to health care services Axis V: 21-30 behavior considerably influenced by delusions or hallucinations OR serious impairment in judgment, communication OR inability to function in almost all areas  Past Medical History:  Past Medical History  Diagnosis Date  . Thyroid disease     Past Surgical History  Procedure Date  . Arm fracture   . Rotator cuff repair     Family History: History reviewed. No pertinent family history.  Social History:  reports that she has never smoked. She does not have any smokeless tobacco history on file. She reports that she drinks alcohol. She reports that she does not use illicit drugs.  Allergies:  Allergies  Allergen Reactions  . Sulfa Antibiotics Rash    Home Medications:  Medications Prior to Admission  Medication Dose Route Frequency Provider Last Rate Last Dose  . ALPRAZolam (XANAX) tablet 1 mg  1 mg Oral Daily PRN Cyndra Numbers, MD      . alum & mag hydroxide-simeth (MAALOX/MYLANTA) 200-200-20 MG/5ML suspension 30 mL  30 mL Oral PRN Jamesetta Orleans Lawyer, PA      . ibuprofen (ADVIL,MOTRIN) tablet 600 mg  600 mg Oral Q8H PRN Carlyle Dolly, PA      . levothyroxine (SYNTHROID, LEVOTHROID) tablet 75 mcg  75 mcg Oral Daily Cyndra Numbers, MD   75 mcg at 09/12/11 0829  . ondansetron (ZOFRAN) tablet 4 mg  4 mg Oral Q8H PRN Jamesetta Orleans Lawyer, PA      . risperiDONE (RISPERDAL) tablet 1 mg  1 mg Oral BID Katheren Puller, MD      . sertraline (ZOLOFT) tablet 200 mg  200 mg Oral Daily Cyndra Numbers, MD   200 mg at 09/12/11 0953  . traZODone (DESYREL) tablet 200 mg  200 mg Oral QHS PRN Cyndra Numbers, MD   200 mg at 09/11/11 2212  . ziprasidone (GEODON) injection 20 mg  20 mg Intramuscular Once [employer], PA   20 mg at 09/11/11 0957   . zolpidem (AMBIEN) tablet 5 mg  5 mg Oral QHS PRN Carlyle Dolly, PA      . DISCONTD: levothyroxine (SYNTHROID, LEVOTHROID) tablet 75 mcg  75 mcg Oral Daily Cyndra Numbers, MD       Medications Prior to Admission  Medication Sig Dispense Refill  . ALPRAZolam (XANAX) 1 MG tablet Take 1 mg by mouth daily as needed. For anxiety      . Cyanocobalamin (VITAMIN B 12 PO) Take 1 tablet by mouth daily.        Marland Kitchen levothyroxine (SYNTHROID, LEVOTHROID) 75 MCG tablet Take 75 mcg by mouth daily.        . sertraline (ZOLOFT) 100 MG tablet Take 200 mg by mouth daily.        . traZODone (DESYREL) 100 MG tablet Take 200 mg by mouth at bedtime as needed. For anxiety        OB/GYN Status:  No LMP recorded. Patient is postmenopausal.  General Assessment Data Assessment Number: 1  Living Arrangements: Family members Can pt return to current living arrangement?: Yes Is patient capable of signing voluntary admission?: Yes Transfer from: Home Referral Source: Self/Family/Friend  Risk to self Suicidal Ideation: No Suicidal Intent: No Is patient at risk for suicide?:  No Suicidal Plan?: No Access to Means: No What has been your use of drugs/alcohol within the last 12 months?: drinks on social occasions- occasionally  Triggers for Past Attempts: None known Intentional Self Injurious Behavior: Damaging Comment - Self Injurious Behavior: she denies (she denies self-injurious behaviours) Family Suicide History: No Recent stressful life event(s): Conflict (Comment);Turmoil (Comment) Persecutory voices/beliefs?: No Depression: No Substance abuse history and/or treatment for substance abuse?: No  Risk to Others Homicidal Ideation: No Thoughts of Harm to Others: No Current Homicidal Plan: No Access to Homicidal Means: No Identified Victim:  (None) Assessment of Violence: None Noted Does patient have access to weapons?: No Criminal Charges Pending?: No Does patient have a court date: No  Mental  Status Report Appear/Hygiene: Disheveled Eye Contact: Fair Motor Activity: Restlessness Speech: Other (Comment) (clear, concise) Level of Consciousness: Alert Mood: Anxious;Irritable Affect: Apprehensive Anxiety Level: Moderate Thought Processes: Irrelevant Judgement: Impaired Orientation: Person;Place;Time;Situation Obsessive Compulsive Thoughts/Behaviors: Severe  Cognitive Functioning Concentration: Decreased Memory: Recent Intact;Remote Intact IQ: Average Insight: Poor Impulse Control: Poor Appetite: Fair Sleep: No Change Total Hours of Sleep:  (varies) Vegetative Symptoms: None  Prior Inpatient/Outpatient Therapy Prior Therapy: Outpatient Prior Therapy Dates:  (within past month) Prior Therapy Facilty/Provider(s):  [facility]) Reason for Treatment: Psychosis            Values / Beliefs Cultural Requests During Hospitalization: None Spiritual Requests During Hospitalization: None        Additional Information 1:1 In Past 12 Months?: No CIRT Risk: No Elopement Risk: No Does patient have medical clearance?: Yes    Disposition:  Patient has been accepted to [facility], Dr. Allena Katz service, Bed 401-2. Support paperwork  completed and faxed to [facility] for review. Patient will be transported by [employer].     On Site Evaluation by:  Ahluwalia Reviewed with Physician:  Vonita Moss 09/12/2011 10:36 AM

## 2011-09-13 ENCOUNTER — Other Ambulatory Visit: Payer: Self-pay

## 2011-09-13 DIAGNOSIS — F259 Schizoaffective disorder, unspecified: Principal | ICD-10-CM

## 2011-09-13 LAB — COMPREHENSIVE METABOLIC PANEL
Albumin: 3.5 g/dL (ref 3.5–5.2)
Alkaline Phosphatase: 100 U/L (ref 39–117)
BUN: 19 mg/dL (ref 6–23)
Chloride: 99 mEq/L (ref 96–112)
GFR calc Af Amer: 80 mL/min — ABNORMAL LOW (ref >90)
Glucose, Bld: 106 mg/dL — ABNORMAL HIGH (ref 70–99)
Potassium: 3.7 mEq/L (ref 3.5–5.1)
Total Bilirubin: 0.5 mg/dL (ref 0.3–1.2)

## 2011-09-13 LAB — HEPATITIS PANEL, ACUTE
HCV Ab: REACTIVE — AB
Hepatitis B Surface Ag: NEGATIVE

## 2011-09-13 MED ORDER — QUETIAPINE FUMARATE 50 MG PO TABS
50.0000 mg | ORAL_TABLET | Freq: Every day | ORAL | Status: DC
  Administered 2011-09-13: 50 mg via ORAL
  Filled 2011-09-13 (×2): qty 1

## 2011-09-13 MED ORDER — ALPRAZOLAM 1 MG PO TABS
1.0000 mg | ORAL_TABLET | Freq: Three times a day (TID) | ORAL | Status: DC
  Administered 2011-09-13 – 2011-09-16 (×12): 1 mg via ORAL
  Filled 2011-09-13 (×12): qty 1

## 2011-09-13 MED ORDER — TRAZODONE HCL 100 MG PO TABS
200.0000 mg | ORAL_TABLET | Freq: Every day | ORAL | Status: DC
  Administered 2011-09-13 – 2011-09-15 (×3): 200 mg via ORAL
  Filled 2011-09-13 (×4): qty 2
  Filled 2011-09-13: qty 20

## 2011-09-13 MED ORDER — ALPRAZOLAM 1 MG PO TABS
1.0000 mg | ORAL_TABLET | ORAL | Status: AC
  Administered 2011-09-13: 1 mg via ORAL
  Filled 2011-09-13: qty 1

## 2011-09-13 NOTE — Progress Notes (Signed)
[facility] Group Notes:  (Counselor/Nursing/MHT/Case Management/Adjunct)  09/13/2011 1:03 PM  Type of Therapy:  9:30AM Group Therapy  Participation Level:  Active  Participation Quality:  Appropriate and Attentive  Affect:  Appropriate  Cognitive:  Alert and Appropriate  Insight:  Good  Engagement in Group:  Good  Engagement in Therapy:  Good  Modes of Intervention:  Activity, Clarification, Education, Support and Exploration  Summary of Progress/Problems: Patient reported being able to ask others for help as a way to maintain self-care. She stated it can sometimes be difficult to ask others for help.     Wilmon Arms 09/13/2011, 1:03 PM Cosigned by [name], MT-BC

## 2011-09-13 NOTE — Progress Notes (Signed)
Suicide Risk Assessment  Admission Assessment     Demographic factors:  Assessment Details Time of Assessment: Admission Information Obtained From: Patient Current Mental Status:   AO x 3. Loss Factors:  Loss Factors: Decline in physical health Historical Factors:  Historical Factors: Family history of mental illness or substance abuse Risk Reduction Factors:  Risk Reduction Factors: Positive social support;Positive therapeutic relationship  CLINICAL FACTORS:   Severe Anxiety and/or Agitation Bipolar Disorder:   Mixed State Schizophrenia:   Paranoid or undifferentiated type Currently Psychotic Previous Psychiatric Diagnoses and Treatments Medical Diagnoses and Treatments/Surgeries  COGNITIVE FEATURES THAT CONTRIBUTE TO RISK:  Thought constriction (tunnel vision)    Diagnosis:  Axis I: Schizoaffective Disorder - Bipolar Type.   The patient was seen today and reports the following:   ADL's: Intact  Sleep: The patient reports to having difficulty initiating and maintaining sleep.  Appetite: The patient reports a decreased appetite.   Mild>(1-10) >Severe  Hopelessness (1-10): 7 Depression (1-10): 4  Anxiety (1-10): 6  Suicidal Ideation: The patient denies any suicidal ideations.  Plan: No  Intent: No  Means: No   Homicidal Ideation: The patient adamantly denies any homicidal ideations today.  Plan: No  Intent: No.  Means: No   General Appearance /Behavior: Neat and Casual Eye Contact: Fair. Speech: Moderately Pressured.  Motor Behavior: Moderately Manic. Level of Consciousness: Alert. Mental Status: AO x 3 Mood: Moderately Depressed. Affect: Moderately Anxious. Anxiety Level: Moderately anxious. Thought Process: Fragmented. Thought Content: The patient reports delusions that she has "oil" oozing from her skin.  Also very focused on her physical health stating she has hepatitis C and requires "blood work." Perception: Delusional. Judgment: Poor. Insight:  Poor. Cognition: Orientation time, place and person.   Treatment Plan Summary:  1. Daily contact with patient to assess and evaluate symptoms and progress in treatment  2. Medication management  3. The patient will deny suicidal ideations or homicidal ideations for 48 hours prior to discharge and have a depression and anxiety rating of 3 or less. The patient will also deny any auditory or visual hallucinations or delusional thinking and display no manic or hypomanic behaviors.   Plan:  1. Restarted medications as prescribed by the patient's outpatient providers.  2. Hepatitis screen pending.  3. Continue to monitor. 4. Patient may require a "forced medication order" if she continues to refuse her Risperdal.  SUICIDE RISK:   Mild:  Suicidal ideation of limited frequency, intensity, duration, and specificity.  There are no identifiable plans, no associated intent, mild dysphoria and related symptoms, good self-control (both objective and subjective assessment), few other risk factors, and identifiable protective factors, including available and accessible social support.   Rebekah Pace 09/13/2011, 2:07 PM

## 2011-09-13 NOTE — Progress Notes (Signed)
Adult Psychosocial Assessment Update Interdisciplinary Team  Previous Behavior Health Hospital admissions/discharges:  Admissions Discharges  Date: 06/04/2011 Date: 06/12/2011  Date: Date:  Date: Date:  Date: Date:  Date: Date:   Changes since the last Psychosocial Assessment (including adherence to outpatient mental health and/or substance abuse treatment, situational issues contributing to decompensation and/or relapse). "Same issues since the last time I was here". Patient reports her skin is normally very dry, but now has an oily film on skin. The oil makes me feel like I am not clean. Patient states this oil is getting on my "bed clothes" and towels. Patient states she was prescribed a diuretic and it made her skin feel "more oily". Pt. 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 Pt. gathered from her physician that she somehow has contracted Hepatitis C. "I don't know how I've contracted Hepatitis C because I don't use drugs". Pt. states she was told she now may have to have a liver transplant, due to the Hepatitis C. Pt. then stated she also has fever-like symptoms and her blood pressure hs been fluctuating. Pt. reported she feels she was plotted against and abducted by [facility] and they had her committed to [facility] "against her will".             Discharge Plan 1. Will you be returning to the same living situation after discharge?   Yes: X No:      If no, what is your plan?    Patient will be returning to same living situation where she lives with her son and her brother. Patient states her brother will pick up at discharge.       2. Would you like a referral for services when you are discharged? Yes:     If yes, for what services?  No:   X    Patient reported she would rather remain with her current psychiatrist in [facility] Patient stated she does not want any contact made with her physician.        Summary and Recommendations (to be completed by the  evaluator) Patient is a 55 y.o. Caucasian female. Patient admitted with diagnosis of Psychotic Disorder NOS. Patient reports she has an oily film on her skin that makes her feel like she isn't clean. Patient would benefit from crisis management, medication evaluation, psycho-education and group therapy, case management for discharge planning, and contact with family by counselor.                       Signature:  Wilmon Arms, 09/13/2011 8:45 AM Cosigned by [name], MT-BC

## 2011-09-13 NOTE — BH Assessment (Signed)
Very anxious earlier, primarily about home meds.  Says cannot take Risperdal ("not effective"). Wants  topical cream reportedly  prescribed for unnamed skin condition.  Presents own list of home meds for our perusal.  Insturcted to discuss with MD in AM.  Medicated with oral Trazadone,  200mg ,  at 00:45 for sleep and oral  Xanax 1mg   At 00:33 for anxiety

## 2011-09-13 NOTE — Progress Notes (Signed)
Pt states she slept fair, appetite is poor. Energy level low. Pt denies SI/HI. Pt has physical c/o " nausea,blood pressure consistently abnormally high. " B/P 89/63 this am. Pt is focused on her medical issues. "my medical perspective is and has been my main issue." Pt also states, "I have suffered panic attacks, I have only 2 doses of Xanax. I came to emergency room because of a great deal of anxiety because of a long bout of skin care." Pt is basically upset because she expected to be treated medically, not sent to [facility]. Pt very focused on skin problem. Pt denies SI/HI

## 2011-09-13 NOTE — Progress Notes (Signed)
Patient seen to assess for discharge planning needs.  She reports having home that she shares with her brother, access to medication and outpatient provider.  She reports going to ED for medical problems and became upset and panicky which lead to her admission to [facility]. She reports being upset with the way she was handle in the ED.  Patient advised of multiple medical problems and feels that she does not need to be inpatient. She will follow up with [facility] at discharge.  Follow up to be scheduled.

## 2011-09-14 DIAGNOSIS — F29 Unspecified psychosis not due to a substance or known physiological condition: Secondary | ICD-10-CM

## 2011-09-14 LAB — HEMOGLOBIN A1C: Hgb A1c MFr Bld: 5.3 % (ref <5.7)

## 2011-09-14 LAB — GLUCOSE, CAPILLARY: Glucose-Capillary: 148 mg/dL — ABNORMAL HIGH (ref 70–99)

## 2011-09-14 MED ORDER — QUETIAPINE FUMARATE 100 MG PO TABS
100.0000 mg | ORAL_TABLET | Freq: Every day | ORAL | Status: DC
  Administered 2011-09-14 – 2011-09-15 (×2): 100 mg via ORAL
  Filled 2011-09-14 (×2): qty 1
  Filled 2011-09-14: qty 10
  Filled 2011-09-14: qty 1

## 2011-09-14 NOTE — Progress Notes (Signed)
Pt. attended and participated in aftercare planning group. Suicide prevention information was given as well as suicide risk warning sign information and crisis line numbers to use. Pt. Explained that she is ready for D/C being staying at [facility] is impeding her health.

## 2011-09-14 NOTE — Progress Notes (Signed)
  Rebekah Pace is a 55 y.o. female [id number] [birth year]  Subjective/Objective:  I saw the patient and reviewed the medical records. Patient is still disorganized anxious and delusional. Assessment/Plan: I reviewed her meds and I will increase the Seroquel 100 mg at bedtime.  Filed Vitals:   09/14/11 0817  BP: 88/57  Pulse: 98  Temp:   Resp:     Lab Results:   BMET    Component Value Date/Time   NA 132* 09/13/2011 1945   K 3.7 09/13/2011 1945   CL 99 09/13/2011 1945   CO2 26 09/13/2011 1945   GLUCOSE 106* 09/13/2011 1945   BUN 19 09/13/2011 1945   CREATININE 0.91 09/13/2011 1945   CALCIUM 9.3 09/13/2011 1945   GFRNONAA 69* 09/13/2011 1945   GFRAA 80* 09/13/2011 1945    Medications:  Scheduled:     . ALPRAZolam  1 mg Oral TID WC & HS  . ALPRAZolam  1 mg Oral NOW  . cephALEXin  500 mg Oral Q6H  . levothyroxine  75 mcg Oral Daily  . mupirocin cream   Topical TID  . potassium chloride  20 mEq Oral BID  . QUEtiapine  50 mg Oral QHS  . sertraline  200 mg Oral Daily  . traZODone  200 mg Oral QHS  . vitamin B-12  100 mcg Oral Daily  . DISCONTD: risperiDONE  0.5 mg Oral BH-qamhs     PRN Meds acetaminophen, alum & mag hydroxide-simeth, hydrOXYzine, magnesium hydroxide, DISCONTD: ALPRAZolam, DISCONTD: traZODone   Brittania Sudbeck S MD 09/14/2011

## 2011-09-14 NOTE — Progress Notes (Signed)
Patient ID: Rebekah Pace, female   DOB: July 27, 1956, 55 y.o.   MRN: [id number] The patient spent the evening resting in bed. She is less anxious and pleasant. Denies any suicidal ideation. She did not attend evening group or get out of bed for snack. Compliant with medication.

## 2011-09-14 NOTE — Progress Notes (Signed)
Patient ID: Rebekah Pace, female   DOB: November 13, 1955, 55 y.o.   MRN: [id number] The patient isolated in her room all evening. She was pleasant and polite. Denies any suicidal ideation. Encouraged to go to evening group, but refused stating she was tired. Compliant with medication.

## 2011-09-14 NOTE — Progress Notes (Signed)
[facility] Group Notes:  (Counselor/Nursing/MHT/Case Management/Adjunct)  09/14/2011 11 AM  Type of Therapy:  Group Therapy, Dance/Movement Therapy   Participation Level:  Active  Participation Quality:  Attentive and Sharing  Affect:  Appropriate  Cognitive:  Alert  Insight:  Good  Engagement in Group:  Limited  Engagement in Therapy:  Limited  Modes of Intervention:  Clarification, Problem-solving, Role-play, Socialization and Support  Summary of Progress/Problems:Pt participated in a group discussion on positive coping skills to use when stuck. Pt identified talking to her brother as something positive she can do and spoke about ways to remember to use this skill at [facility] and upon D/C. Pt spoke about how her brother is a positive support for her and how she also supports him.   Gevena Mart

## 2011-09-14 NOTE — Progress Notes (Signed)
Pt is cooperative and smiles on approach    She did however then start talking about not receiving treatment for her skin leaking fluid   She began by saying her skin was oily and ended by saying her skin was very dry   She has isolated in her room and not attended group this morning although she did go to a later group   She is compliant with medications and treatment  Her interactions with others are minimal but appropriate   Verbal support given  Medications administered and effectiveness monitored  Q 15 min checks   Pt safe at present

## 2011-09-14 NOTE — Progress Notes (Signed)
therapist called and spoke to pt.'s brother after talking to [facility] Theodoro Kos and hearing that he may have additional concerns. Pt.'s brother stated that this was "not a good time" to talk but that he was very upset with the care the pt received at [facility]. He asked basic questions as to who would be working with the pt on monday and agreed to call back then.

## 2011-09-15 NOTE — Progress Notes (Signed)
Pt. attended and participated in aftercare planning group. [facility] support group information was given.

## 2011-09-15 NOTE — Progress Notes (Signed)
Patient ID: Rebekah Pace, female   DOB: 05/02/1956, 55 y.o.   MRN: [id number] The patient isolated most of the evening in her room, except to come out for medication and snack. C/O feeling dehydrated. Encouraged to drink more fluids and was given a large glass of Gatorade. States she had a terrible experience in the ED and is untrusting of staff because of her experience. She does not feel she needs to be here at [facility] and that she has medical issues that need to be addressed. Continues to express her fixed delusion about her skin oozing fluid.

## 2011-09-15 NOTE — Progress Notes (Signed)
Pt has been up and has been participating in some activities in the milieu today, spoke briefly about going through the ED and her experience there, did speak about various medical problems that she is currently having, pt did receive medications today without incident, will continue to monitor.

## 2011-09-15 NOTE — Progress Notes (Signed)
  Patient ID: Rebekah Pace, female   DOB: 12/09/1955, 55 y.o.   MRN: [id number]   [facility] Group Notes:  (Counselor/Nursing/MHT/Case Management/Adjunct)  09/15/2011 11 AM  Type of Therapy:  Group Therapy   Participation Level:  Active  Participation Quality:  Sharing  Affect:  Appropriate  Cognitive:  Oriented  Insight:  Limited  Engagement in Group:  Limited  Engagement in Therapy:  Limited  Modes of Intervention:  Clarification, Problem-solving, Socialization and Support  Summary of Progress/Problems:  Group focused on what makes a support healthy or unhealthy, as well as how to support themselves when supports are not accessible. Pt. Said that she was feeling ok. She shared that her older brother is a healthy support for her because their relationship is a "two way street."   [place], [address]

## 2011-09-15 NOTE — Progress Notes (Signed)
ESTHA FEW is a 55 y.o. female [id number] Jul 08, 1956  Subjective/Objective:       Ms. Fialkowski is resting comfortably in bed today. Her eye contact is good, her speech is clear and goal directed. She relates her story going through the emergency room and her admission to the [facility]. Ms. Grams denies suicidal ideation or homicidality. She denies auditory or visual hallucinations. She expresses her frustration over being described as delusional. She says the reason she is called delusional, is because she has an oily sensation to her face and neck. She goes on to describe a perfect hot flash. She does mention that she has several problems including being recently diagnosed with hepatitis C. She voices frustration in not being able to work directly with her primary care doctor regarding her hepatitis C. Her primary care doctor is Harvetta Jones. She reports she is also being followed by her dermatologist Dr. Terri Piedra.   Assessment/Plan:            Carefull watchfull waiting and no change in current plan of care. Filed Vitals:   09/15/11 0803  BP: 97/62  Pulse: 109  Temp:   Resp:     Lab Results:   BMET    Component Value Date/Time   NA 132* 09/13/2011 1945   K 3.7 09/13/2011 1945   CL 99 09/13/2011 1945   CO2 26 09/13/2011 1945   GLUCOSE 106* 09/13/2011 1945   BUN 19 09/13/2011 1945   CREATININE 0.91 09/13/2011 1945   CALCIUM 9.3 09/13/2011 1945   GFRNONAA 69* 09/13/2011 1945   GFRAA 80* 09/13/2011 1945    Medications:  Scheduled:     . ALPRAZolam  1 mg Oral TID WC & HS  . cephALEXin  500 mg Oral Q6H  . levothyroxine  75 mcg Oral Daily  . mupirocin cream   Topical TID  . QUEtiapine  100 mg Oral QHS  . sertraline  200 mg Oral Daily  . traZODone  200 mg Oral QHS  . vitamin B-12  100 mcg Oral Daily     PRN Meds acetaminophen, alum & mag hydroxide-simeth, hydrOXYzine, magnesium hydroxide   Lloyd Huger T. Sunday Klos [facility] 09/15/2011

## 2011-09-16 LAB — GLUCOSE, CAPILLARY: Glucose-Capillary: 95 mg/dL (ref 70–99)

## 2011-09-16 MED ORDER — ALPRAZOLAM 1 MG PO TABS: 1.0000 mg | ORAL_TABLET | Freq: Three times a day (TID) | ORAL | Status: AC

## 2011-09-16 MED ORDER — TRAZODONE HCL 100 MG PO TABS: 200.0000 mg | ORAL_TABLET | Freq: Every day | ORAL | Status: DC

## 2011-09-16 MED ORDER — QUETIAPINE FUMARATE 100 MG PO TABS: 100.0000 mg | ORAL_TABLET | Freq: Every day | ORAL | Status: AC

## 2011-09-16 MED ORDER — CYANOCOBALAMIN 100 MCG PO TABS: 100.0000 ug | ORAL_TABLET | Freq: Every day | ORAL | Status: DC

## 2011-09-16 MED ORDER — CEPHALEXIN 500 MG PO CAPS: 500.0000 mg | ORAL_CAPSULE | Freq: Four times a day (QID) | ORAL | Status: AC

## 2011-09-16 NOTE — Progress Notes (Signed)
Suicide Risk Assessment  Discharge Assessment     Demographic factors:  Assessment Details Time of Assessment: Admission Information Obtained From: Patient Current Mental Status: AO x 3.  Risk Reduction Factors:  Risk Reduction Factors: Positive social support;Positive therapeutic relationship  CLINICAL FACTORS:   Medical Diagnoses and Treatments/Surgeries  Diagnosis:  Axis I: Schizoaffective Disorder - Bipolar Type.  The patient was seen today and reports the following:   Sleep: The patient reports to sleeping well last night without difficulty.  Appetite: Fair.   Mild>(1-10) >Severe  Hopelessness (1-10): 0  Depression (1-10): 0  Anxiety (1-10):  2-3.    Suicidal Ideation: The patient adamantly denies any suicidal ideations today.  Plan: No  Intent: No  Means: No   Homicidal Ideation: The patient adamantly denies any homicidal ideations today.  Plan: No  Intent: No.  Means: No   Eye Contact: Good.  General Appearance/Behavior: Neat and Casual  Motor Behavior: Normal  Speech: WNL  Mental Status: Alert and Oriented x 3  Level of Consciousness: Alert  Mood: Essentially Euthymic.  Affect: Mildly Constricted.  Anxiety: Mild.  Thought Process: Coherent  Thought Content: WNL. No auditory or visual hallucinations or delusional thinking reported.  She states that the "oil oozing from her skin" is much improved. Perception: Normal  Judgment: Fair to Good.  Insight: Fair to Good.  Cognition: Orientation time, place and person.   Treatment Plan Summary:  1. Daily contact with patient to assess and evaluate symptoms and progress in treatment  2. Medication management  3. The patient will deny suicidal ideations or homicidal ideations for 48 hours prior to discharge and have a depression and anxiety rating of 3 or less. The patient will also deny any auditory or visual hallucinations or delusional thinking or display any manic or hypomanic behaviors.   Plan:  1. Will  continue current medications.  2. Will continue to monitor. 3. Will discharge today.  SUICIDE RISK:   Minimal: No identifiable suicidal ideation.  Patients presenting with no risk factors but with morbid ruminations; may be classified as minimal risk based on the severity of the depressive symptoms   Rebekah Pace 09/16/2011, 9:39 AM

## 2011-09-16 NOTE — Progress Notes (Signed)
[facility] Group Notes:  (Counselor/Nursing/MHT/Case Management/Adjunct)  09/16/2011 12:47 PM  Type of Therapy:  9:30AM Group Therapy  Participation Level:  Active  Participation Quality:  Appropriate, Attentive and Sharing  Affect:  Appropriate  Cognitive:  Alert and Appropriate  Insight:  Good  Engagement in Group:  Good  Engagement in Therapy:  Good  Modes of Intervention:  Clarification, Education, Support and Exploration  Summary of Progress/Problems: Patient reported upon discharge she plans to go back to her home. Patient stated her brother has been really concerned with the progress of her treatment and she feels he is not the getting the information he wants. Patient also reported her "skin problems" has also caused a decline in her physical health. Then she began to report how she felt she was "mistreated" by the ED.   Wilmon Arms 09/16/2011, 12:47 PM [name], MT-BC 09/16/2011 2:40 PM

## 2011-09-16 NOTE — Tx Team (Signed)
  Interdisciplinary Treatment Plan Update (Adult)  Date:  09/16/2011  Time Reviewed:  8:48 AM   Progress in Treatment: Attending groups: Yes. Participating in groups:  Yes. Taking medication as prescribed:  Yes. Tolerating medication:  Yes. Family/Significant othe contact made:  Yes, individual(s) contacted:  Contact made with brother over the weekend. Counselor to call brother today to see if he's okay with discharge Patient understands diagnosis:  No., lacks insight Discussing patient identified problems/goals with staff:  Yes Medical problems stabilized or resolved:  No, patient feels that her skin condition has not been properly addressed. Encouraged her to follow up with medical doctor when leaving the hospital Denies suicidal/homicidal ideation: Yes. Issues/concerns per patient self-inventory:  No. Other:  New problem(s) identified: No  Reason for Continuation of Hospitalization: None  Interventions implemented related to continuation of hospitalization:  Medication stabilization, group therapy, psychoeducation, 15-minute safety checks, monitoring of symptoms  Additional comments:  Not applicable  Estimated length of stay: D/C today  Discharge Plan: Patient plans to return home with brother. Return to Dr.  Jules Schick for medications and Katheran Awe for therapy.  New goal(s):  Review of initial/current patient goals per problem list:   1.  Goal(s):  Patient's behavior demonstrates decreased signs in psychosis.  Met:  No  Target date:  By Discharge  As evidenced by: Patient continues to have continued beliefs that there is something wrong with her skin, otherwise reality oriented. Deferred to outpatient follow up.  2.  Goal (s):    Met:    Target date:  By Discharge                     As evidenced by:  3.  Goal(s):    Met:    Target date:  By Discharge  As evidenced by:    4.  Goal(s):    Met:    Target date:  By Discharge  As evidenced by:     Attendees: Patient:  Rebekah Pace  09/16/2011 8:48 AM   Family:     Physician:  Dr. Harvie Heck Reading 09/16/2011 8:48 AM   Nursing:  Robbie Louis, RN  09/16/2011 8:48 AM   Case Manager:  Barrie Folk, RN MSEDS 09/16/2011 8:48 AM  Counselor:   [name], MT-BC 09/16/2011 8:48 AM   Other:  Kenard Gower, RN 09/16/2011 8;48 AM  Other:  Nanine Means, NP  09/16/2011 8:48 AM  Other:     Other:      Scribe for Treatment Team:   [name], 09/16/2011 , 8:48 AM

## 2011-09-16 NOTE — Discharge Planning (Signed)
Patient was present during treatment team this AM. Patient will return to live with her brother after discharge. He will pick patient up and transport to their home. Patient will follow up with Dr. Jamas Lav at [facility] on Wednesday 09/18/11 @ 9:30 AM. Receptionist at [facility] stated that they would set patient up with therapist when patient presents on 09/18/11. Patient denies SI/ Hi, has access to medications, verbalized safety plan and access to transportation. No barriers to discharge.

## 2011-09-16 NOTE — Progress Notes (Signed)
Patient ID: Rebekah Pace, female   DOB: Aug 16, 1956, 55 y.o.   MRN: [id number] The patient continues to spend most of her time isolating in her room. Needs encouragement to interact in the dayroom. Has a flat affect and depressed, anxious mood. Expressed feelings of hoplesness and helplessness. Feels that no one understands that she has serious medical problems.

## 2011-09-16 NOTE — Progress Notes (Signed)
Adult Services Patient - Family Contact/Sessions   Attendees:  Patient's brother, Peaches Vanoverbeke ([phone])  Goal(s):  Discuss discharge planning  Safety Concerns:  Concerned that her skin problem was not addressed.  Narrative:  Informed brother that patient was ready for discharge. Continues to have a fixed delusion about her skin. There does not seem to be a medical problem related but suggested that it could be related to menopause. Stated that the medication was helping decrease her delusional thinking about her skin. Encouraged her to follow up with outpatient physician. Informed brother that patient did not meet criteria for continued stay due to her not being depressed, suicidal or anxious.  Brother was upset because skin problem had not been fixed. Reminded him that he did not think she needed to be in the hospital last admission. He stated that she has issues but right now what needs to be addressed is her skin. Brother not willing to look at this is a delusion. Brother stated he would be the one picking her up at discharge and was told that he could come at his convenience.   Barrier(s):  Patient and brother's lack of insight into illness  Interventions:  Discussed discharge planning  Recommendation(s):  Outpatient follow up with psychiatrist   Follow-up Required: no Explanation for follow-up:

## 2011-10-18 ENCOUNTER — Ambulatory Visit (INDEPENDENT_AMBULATORY_CARE_PROVIDER_SITE_OTHER): Payer: Self-pay

## 2011-10-18 DIAGNOSIS — B182 Chronic viral hepatitis C: Secondary | ICD-10-CM

## 2011-11-13 ENCOUNTER — Other Ambulatory Visit: Payer: Self-pay

## 2011-11-13 ENCOUNTER — Emergency Department (HOSPITAL_COMMUNITY)
Admission: EM | Admit: 2011-11-13 | Discharge: 2011-11-14 | Disposition: A | Discharge status: Other Acute Inpatient Hospital | Payer: Medicaid Other | Source: Home / Self Care | Attending: Emergency Medicine | Admitting: Emergency Medicine

## 2011-11-13 DIAGNOSIS — I498 Other specified cardiac arrhythmias: Secondary | ICD-10-CM | POA: Insufficient documentation

## 2011-11-13 DIAGNOSIS — Z79899 Other long term (current) drug therapy: Secondary | ICD-10-CM | POA: Insufficient documentation

## 2011-11-13 DIAGNOSIS — F411 Generalized anxiety disorder: Secondary | ICD-10-CM | POA: Insufficient documentation

## 2011-11-13 DIAGNOSIS — E079 Disorder of thyroid, unspecified: Secondary | ICD-10-CM | POA: Insufficient documentation

## 2011-11-13 DIAGNOSIS — F29 Unspecified psychosis not due to a substance or known physiological condition: Secondary | ICD-10-CM | POA: Insufficient documentation

## 2011-11-13 DIAGNOSIS — F23 Brief psychotic disorder: Secondary | ICD-10-CM

## 2011-11-13 DIAGNOSIS — F22 Delusional disorders: Secondary | ICD-10-CM | POA: Insufficient documentation

## 2011-11-13 LAB — DIFFERENTIAL
Basophils Absolute: 0 10*3/uL (ref 0.0–0.1)
Basophils Relative: 0 % (ref 0–1)
Eosinophils Relative: 0 % (ref 0–5)
Lymphocytes Relative: 15 % (ref 12–46)
Neutro Abs: 7 10*3/uL (ref 1.7–7.7)

## 2011-11-13 LAB — CBC
MCHC: 35 g/dL (ref 30.0–36.0)
MCV: 81.9 fL (ref 78.0–100.0)
Platelets: 265 10*3/uL (ref 150–400)
RDW: 13.8 % (ref 11.5–15.5)
WBC: 9.7 10*3/uL (ref 4.0–10.5)

## 2011-11-13 LAB — COMPREHENSIVE METABOLIC PANEL
ALT: 40 U/L — ABNORMAL HIGH (ref 0–35)
AST: 70 U/L — ABNORMAL HIGH (ref 0–37)
Albumin: 3.9 g/dL (ref 3.5–5.2)
CO2: 19 mEq/L (ref 19–32)
Calcium: 9.7 mg/dL (ref 8.4–10.5)
GFR calc non Af Amer: 52 mL/min — ABNORMAL LOW (ref >90)
Sodium: 138 mEq/L (ref 135–145)

## 2011-11-13 LAB — RAPID URINE DRUG SCREEN, HOSP PERFORMED
Amphetamines: NOT DETECTED
Barbiturates: NOT DETECTED
Benzodiazepines: POSITIVE — AB

## 2011-11-13 MED ORDER — SERTRALINE HCL 50 MG PO TABS: 200.0000 mg | ORAL_TABLET | Freq: Every day | ORAL | Status: DC

## 2011-11-13 MED ORDER — ZIPRASIDONE MESYLATE 20 MG IM SOLR
20.0000 mg | Freq: Once | INTRAMUSCULAR | Status: AC
  Administered 2011-11-13: 20 mg via INTRAMUSCULAR
  Filled 2011-11-13: qty 20

## 2011-11-13 MED ORDER — NICOTINE 21 MG/24HR TD PT24: 21.0000 mg | MEDICATED_PATCH | Freq: Every day | TRANSDERMAL | Status: DC

## 2011-11-13 MED ORDER — ACETAMINOPHEN 325 MG PO TABS: 650.0000 mg | ORAL_TABLET | ORAL | Status: DC | PRN

## 2011-11-13 MED ORDER — ZIPRASIDONE HCL 20 MG PO CAPS
20.0000 mg | ORAL_CAPSULE | Freq: Two times a day (BID) | ORAL | Status: DC
  Administered 2011-11-14: 20 mg via ORAL
  Filled 2011-11-13: qty 1

## 2011-11-13 MED ORDER — IBUPROFEN 600 MG PO TABS: 600.0000 mg | ORAL_TABLET | Freq: Three times a day (TID) | ORAL | Status: DC | PRN

## 2011-11-13 MED ORDER — VITAMIN B-12 100 MCG PO TABS
100.0000 ug | ORAL_TABLET | Freq: Every day | ORAL | Status: DC
  Filled 2011-11-13: qty 1

## 2011-11-13 MED ORDER — LEVOTHYROXINE SODIUM 75 MCG PO TABS
75.0000 ug | ORAL_TABLET | Freq: Every day | ORAL | Status: DC
  Administered 2011-11-14: 75 ug via ORAL
  Filled 2011-11-13: qty 1

## 2011-11-13 MED ORDER — TRAZODONE HCL 100 MG PO TABS: 200.0000 mg | ORAL_TABLET | Freq: Every day | ORAL | Status: DC

## 2011-11-13 NOTE — ED Notes (Signed)
[id number]<AV> Expected date:11/13/11<BR> Expected time: 4:22 PM<BR> Means of arrival:Ambulance<BR> Comments:<BR> EMS 12 GC - psych

## 2011-11-13 NOTE — ED Notes (Signed)
Pt combative, lashing out at staff.  [employer] at bedside

## 2011-11-13 NOTE — ED Notes (Signed)
ACT attempted assessment, pt was unable to remain alert. ACT will attempt assessment later.
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
## 2011-11-13 NOTE — ED Notes (Signed)
ACT team here to see pt. 

## 2011-11-13 NOTE — ED Notes (Signed)
Pt arrives to unit via WC due to meds given. Pt is able to transfer self independently. Is presently shivering, extra blankets given. Pt also states she is hungry, given meal. Pt dozes at intervals but awakens spontaneously to voice. Denies pain, calm and cooperative at present.
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
## 2011-11-13 NOTE — ED Notes (Signed)
Urine collected and sent down to lab for keeping. No order for testing at this time. RN notified 
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
## 2011-11-14 ENCOUNTER — Encounter (HOSPITAL_COMMUNITY): Payer: Self-pay | Admitting: *Deleted

## 2011-11-14 ENCOUNTER — Other Ambulatory Visit: Payer: Self-pay

## 2011-11-14 ENCOUNTER — Inpatient Hospital Stay (HOSPITAL_COMMUNITY)
Admission: EM | Admit: 2011-11-14 | Discharge: 2011-11-25 | DRG: 885 | Disposition: A | Discharge status: Home / Self Care | Payer: Medicaid Other | Source: Ambulatory Visit | Attending: Psychiatry | Admitting: Psychiatry

## 2011-11-14 DIAGNOSIS — B1920 Unspecified viral hepatitis C without hepatic coma: Secondary | ICD-10-CM

## 2011-11-14 DIAGNOSIS — Z882 Allergy status to sulfonamides status: Secondary | ICD-10-CM

## 2011-11-14 DIAGNOSIS — Z79899 Other long term (current) drug therapy: Secondary | ICD-10-CM

## 2011-11-14 DIAGNOSIS — E039 Hypothyroidism, unspecified: Secondary | ICD-10-CM

## 2011-11-14 DIAGNOSIS — R609 Edema, unspecified: Secondary | ICD-10-CM

## 2011-11-14 DIAGNOSIS — F3110 Bipolar disorder, current episode manic without psychotic features, unspecified: Secondary | ICD-10-CM

## 2011-11-14 DIAGNOSIS — T25029A Burn of unspecified degree of unspecified foot, initial encounter: Secondary | ICD-10-CM

## 2011-11-14 DIAGNOSIS — F250 Schizoaffective disorder, bipolar type: Secondary | ICD-10-CM | POA: Diagnosis present

## 2011-11-14 DIAGNOSIS — F411 Generalized anxiety disorder: Secondary | ICD-10-CM

## 2011-11-14 DIAGNOSIS — Z6825 Body mass index (BMI) 25.0-25.9, adult: Secondary | ICD-10-CM

## 2011-11-14 DIAGNOSIS — E876 Hypokalemia: Secondary | ICD-10-CM

## 2011-11-14 DIAGNOSIS — F259 Schizoaffective disorder, unspecified: Principal | ICD-10-CM

## 2011-11-14 HISTORY — DX: Anxiety disorder, unspecified: F41.9

## 2011-11-14 HISTORY — DX: Depression, unspecified: F32.A

## 2011-11-14 HISTORY — DX: Mental disorder, not otherwise specified: F99

## 2011-11-14 HISTORY — DX: Inflammatory liver disease, unspecified: K75.9

## 2011-11-14 HISTORY — DX: Major depressive disorder, single episode, unspecified: F32.9

## 2011-11-14 LAB — URINALYSIS, ROUTINE W REFLEX MICROSCOPIC
Nitrite: NEGATIVE
Protein, ur: NEGATIVE mg/dL
Specific Gravity, Urine: 1.01 (ref 1.005–1.030)
Urobilinogen, UA: 0.2 mg/dL (ref 0.0–1.0)

## 2011-11-14 LAB — BASIC METABOLIC PANEL
Calcium: 9.5 mg/dL (ref 8.4–10.5)
GFR calc non Af Amer: 59 mL/min — ABNORMAL LOW (ref >90)
Potassium: 3 mEq/L — ABNORMAL LOW (ref 3.5–5.1)
Sodium: 131 mEq/L — ABNORMAL LOW (ref 135–145)

## 2011-11-14 MED ORDER — LEVOTHYROXINE SODIUM 75 MCG PO TABS
75.0000 ug | ORAL_TABLET | Freq: Every day | ORAL | Status: DC
  Administered 2011-11-14 – 2011-11-20 (×7): 75 ug via ORAL
  Filled 2011-11-14 (×9): qty 1

## 2011-11-14 MED ORDER — CLONAZEPAM 0.5 MG PO TABS
0.5000 mg | ORAL_TABLET | ORAL | Status: DC
  Filled 2011-11-14 (×3): qty 1

## 2011-11-14 MED ORDER — LEVOTHYROXINE SODIUM 75 MCG PO TABS
75.0000 ug | ORAL_TABLET | Freq: Every day | ORAL | Status: DC
  Filled 2011-11-14 (×3): qty 1

## 2011-11-14 MED ORDER — LORAZEPAM 1 MG PO TABS
1.0000 mg | ORAL_TABLET | ORAL | Status: DC | PRN
  Administered 2011-11-18: 1 mg via ORAL
  Filled 2011-11-14: qty 1

## 2011-11-14 MED ORDER — ZIPRASIDONE HCL 20 MG PO CAPS
20.0000 mg | ORAL_CAPSULE | Freq: Two times a day (BID) | ORAL | Status: DC
  Filled 2011-11-14 (×3): qty 1

## 2011-11-14 MED ORDER — HALOPERIDOL LACTATE 5 MG/ML IJ SOLN: 5.0000 mg | Freq: Four times a day (QID) | INTRAMUSCULAR | Status: DC | PRN

## 2011-11-14 MED ORDER — POTASSIUM CHLORIDE 20 MEQ/15ML (10%) PO LIQD
40.0000 meq | Freq: Once | ORAL | Status: AC
  Administered 2011-11-14: 40 meq via ORAL
  Filled 2011-11-14: qty 30

## 2011-11-14 MED ORDER — ACETAMINOPHEN 325 MG PO TABS
650.0000 mg | ORAL_TABLET | Freq: Four times a day (QID) | ORAL | Status: DC | PRN
  Administered 2011-11-15 – 2011-11-23 (×21): 650 mg via ORAL

## 2011-11-14 MED ORDER — POTASSIUM CHLORIDE CRYS ER 20 MEQ PO TBCR
20.0000 meq | EXTENDED_RELEASE_TABLET | Freq: Two times a day (BID) | ORAL | Status: DC
  Administered 2011-11-14: 20 meq via ORAL
  Filled 2011-11-14: qty 1

## 2011-11-14 MED ORDER — ZIPRASIDONE HCL 20 MG PO CAPS
20.0000 mg | ORAL_CAPSULE | Freq: Once | ORAL | Status: AC
  Administered 2011-11-14: 20 mg via ORAL
  Filled 2011-11-14: qty 1

## 2011-11-14 MED ORDER — MAGNESIUM HYDROXIDE 400 MG/5ML PO SUSP
30.0000 mL | Freq: Every day | ORAL | Status: DC | PRN
  Administered 2011-11-18: 30 mL via ORAL

## 2011-11-14 MED ORDER — RISPERIDONE 1 MG PO TABS
1.0000 mg | ORAL_TABLET | Freq: Every day | ORAL | Status: DC
  Filled 2011-11-14 (×2): qty 1

## 2011-11-14 MED ORDER — ALUM & MAG HYDROXIDE-SIMETH 200-200-20 MG/5ML PO SUSP: 30.0000 mL | ORAL | Status: DC | PRN

## 2011-11-14 MED ORDER — VITAMIN B-12 100 MCG PO TABS
100.0000 ug | ORAL_TABLET | Freq: Every day | ORAL | Status: DC
  Administered 2011-11-14 – 2011-11-25 (×11): 100 ug via ORAL
  Filled 2011-11-14 (×13): qty 1

## 2011-11-14 MED ORDER — ZIPRASIDONE HCL 40 MG PO CAPS
40.0000 mg | ORAL_CAPSULE | Freq: Two times a day (BID) | ORAL | Status: DC
  Administered 2011-11-14: 40 mg via ORAL
  Filled 2011-11-14 (×4): qty 1

## 2011-11-14 MED ORDER — TRAZODONE HCL 100 MG PO TABS
200.0000 mg | ORAL_TABLET | Freq: Every day | ORAL | Status: DC
  Administered 2011-11-15 – 2011-11-24 (×9): 200 mg via ORAL
  Filled 2011-11-14 (×15): qty 2

## 2011-11-14 MED ORDER — ENSURE IMMUNE HEALTH PO LIQD
237.0000 mL | Freq: Three times a day (TID) | ORAL | Status: DC
  Administered 2011-11-14 – 2011-11-18 (×11): 237 mL via ORAL
  Administered 2011-11-19 (×3): via ORAL
  Administered 2011-11-20: 237 mL via ORAL
  Administered 2011-11-20: 07:00:00 via ORAL
  Administered 2011-11-20 – 2011-11-21 (×3): 237 mL via ORAL
  Administered 2011-11-21: 07:00:00 via ORAL
  Administered 2011-11-22 (×3): 237 mL via ORAL
  Administered 2011-11-22 – 2011-11-23 (×2): via ORAL
  Administered 2011-11-23 – 2011-11-24 (×2): 237 mL via ORAL
  Administered 2011-11-24 – 2011-11-25 (×2): via ORAL

## 2011-11-14 MED ORDER — RISPERIDONE 1 MG PO TABS
1.0000 mg | ORAL_TABLET | Freq: Four times a day (QID) | ORAL | Status: DC | PRN
  Filled 2011-11-14: qty 1
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
## 2011-11-14 NOTE — [facility] Suicide Risk Assessment (Signed)
Suicide Risk Assessment  Admission Assessment     Demographic factors:  Assessment Details Time of Assessment: Admission Information Obtained From: Patient Current Mental Status:  Current Mental Status:  (Denies SI/HI) Loss Factors:  Loss Factors: Decline in physical health Historical Factors:  Historical Factors: Impulsivity Risk Reduction Factors:  Risk Reduction Factors: Religious beliefs about death;Positive social support;Positive therapeutic relationship  CLINICAL FACTORS:   Severe Anxiety and/or Agitation Currently Psychotic Previous Psychiatric Diagnoses and Treatments Medical Diagnoses and Treatments/Surgeries Schizoaffective Disorder - Bipolar Type.  COGNITIVE FEATURES THAT CONTRIBUTE TO RISK:  Polarized thinking    Diagnosis:  Axis I: Schizoaffective Disorder - Bipolar Type.   The patient was seen today and reports the following:   Sleep: The patient reports to sleeping well overall but reports sleeping at "odd times." Appetite: The patient reports a decreased appetite.   Mild>(1-10) >Severe  Hopelessness (1-10): 0  Depression (1-10): 0  Anxiety (1-10): 9.   Suicidal Ideation: The patient adamantly denies any suicidal ideations today.  Plan: No  Intent: No  Means: No   Homicidal Ideation: The patient adamantly denies any homicidal ideations today.  Plan: No  Intent: No.  Means: No   Eye Contact: Good.  General Appearance/Behavior: Somewhat disshelved. Motor Behavior: Moderately agitated. Speech: Moderately increased in rate and volume.  Mental Status: Alert and Oriented x 3  Level of Consciousness: Alert  Mood: Moderately Manic.  Affect: Moderately Expansive.  Anxiety: Severe Anxiety Reported.  Thought Process: Coherent  Thought Content: WNL. No auditory or visual hallucinations noted.  The patient continues to report delusional thoughts that "oil is oozing from my skin" but this is much improved.  Perception: Somewhat delusional.  Judgment: Fair to  Poor.  Insight: Poor.  Cognition: Orientated to time, place and person.   Time was spent today discussing with the patient the situation leading to her admission.  The patient could not explain events but did state she had stopped all of her medications and would not restart them.  After some discussion, the patient did agree to take Klonopin for anxiety and Geodon as a mood stabilizer.  Treatment Plan Summary:  1. Daily contact with patient to assess and evaluate symptoms and progress in treatment  2. Medication management  3. The patient will deny suicidal ideations or homicidal ideations for 48 hours prior to discharge and have a depression and anxiety rating of 3 or less. The patient will also deny any auditory or visual hallucinations or delusional thinking or display any manic or hypomanic behaviors.   Plan:  1. Will start Geodon at 40 mgs po BID with meals as a mood stabilizer.   2. EKG was ordered and is unremarkable. 3. Will start Klonopin 0.5 mgs po q am, 2 pm and hs for anxiety. 4. Will start KCL 20 mEQ po BID x 2 doses for low potassium. 5. Repeat basic metabolic panel in am. 6. Will continue to monitor.   SUICIDE RISK:   Minimal: No identifiable suicidal ideation.  Patients presenting with no risk factors but with morbid ruminations; may be classified as minimal risk based on the severity of the depressive symptoms   Montoya Brandel 11/14/2011, 4:36 PM

## 2011-11-14 NOTE — ED Notes (Signed)
Pt. Given new,clean, dry socks and placed a blanket around her shoulders, ambulated without any problems out of psych ed with 2 [employer] officers and 1 bag of belongings.

## 2011-11-14 NOTE — Tx Team (Signed)
Interdisciplinary Treatment Plan Update (Adult)  Date:  11/14/2011  Time Reviewed:  10:15AM-11:00AM  Progress in Treatment: Attending groups:  Just arrived Participating in groups:    Just arrived Taking medication as prescribed:    Just arrived Tolerating medication:   Just arrived Family/Significant other contact made:  Just arrived Patient understands diagnosis:   Just arrived Discussing patient identified problems/goals with staff:   Just arrived Medical problems stabilized or resolved:   Just arrived Denies suicidal/homicidal ideation:  Just arrived, but denies Issues/concerns per patient self-inventory:   Just arrived Other:  New problem(s) identified: No, Describe:    Reason for Continuation of Hospitalization: Anxiety Delusions  Mania Medical Issues Medication stabilization  Interventions implemented related to continuation of hospitalization:  Medication monitoring and adjustment, safety checks Q15 min., suicide risk assessment, group therapy, psychoeducation, collateral contact, aftercare planning, ongoing physician assessments, medication education  Additional comments:  Not applicable  Estimated length of stay:  3-5 days  Discharge Plan:  Return to her appointment, follow up to be determined as she currently states she will not return to Dr. Jamas Lav  New goal(s):  Not applicable  Review of initial/current patient goals per problem list:   1.  Goal(s):  Reduce anxiety "overwhelming" at admission to no more than 3 at discharge.  Met:  No  Target date:  By Discharge   As evidenced by:  Just arrived, states anxiety is overwhelming  2.  Goal(s):  Reduce manic symptoms to baseline.  Met:  No  Target date:  By Discharge   As evidenced by:  Remains manic at this time, Just arrived  3.  Goal(s):  Determine aftercare for psychiatric and medical, as patient cannot or will not return to providers.  Met:  No  Target date:  By Discharge   As evidenced  by:  Just arrived   Attendees: Patient:  Rebekah Pace  11/14/2011 10:30AM  Family:     Physician:  Dr. Harvie Heck Readling 11/14/2011 10:30AM  Nursing:   Tacy Learn, RN 11/14/2011 10:30AM    Case Manager:  Ambrose Mantle, LCSW 11/14/2011 10:30AM  Counselor:  [name], MT-BC 11/14/2011 10:30AM  Other:   Lynann Bologna, NP 11/14/2011 10:30AM  Other:      Other:      Other:       Scribe for Treatment Team:   Sarina Ser, 11/14/2011, 11:47 AM

## 2011-11-14 NOTE — ED Provider Notes (Signed)
6:18 AM Accepted to behavior health by Dr. Tawni Millers, MD 11/14/11 [phone]

## 2011-11-14 NOTE — ED Notes (Addendum)
Report called to Selena Batten on Adult Unit at [facility]. Assessment office at [facility] has requested that we wait until after shift change to send pt. Kim made aware. Potassium given. Per our EDP, no re-check of K level is needed for a 3.3 level.

## 2011-11-14 NOTE — ED Notes (Signed)
Pt's information for placement has been faxed to assessment office by ACT Team member. Awaiting approval.

## 2011-11-14 NOTE — Tx Team (Addendum)
Initial Interdisciplinary Treatment Plan  PATIENT STRENGTHS: (choose at least two) Average or above average intelligence Motivation for treatment/growth  PATIENT STRESSORS: People judging her   PROBLEM LIST: Problem List/Patient Goals Date to be addressed Date deferred Reason deferred Estimated date of resolution  Anxiety 11/14/11     Mania 11/14/11                                                DISCHARGE CRITERIA:  Ability to meet basic life and health needs Improved stabilization in mood, thinking, and/or behavior  PRELIMINARY DISCHARGE PLAN: Attend aftercare/continuing care group Return to previous living arrangement  PATIENT/FAMIILY INVOLVEMENT: This treatment plan has been presented to and reviewed with the patient, Rebekah Pace, and/or family member.  The patient and family have been given the opportunity to ask questions and make suggestions.  Rebekah Pace 11/14/2011, 10:19 AM

## 2011-11-14 NOTE — ED Notes (Signed)
[employer] awaiting 2nd officer to transport pt. To [facility].

## 2011-11-14 NOTE — Tx Team (Signed)
Interview by Treatment Team:  "Not sleeping at night, sleeping different times because trying to clean house.  Eating what is very good for me, at times I think I could be eating more."  "Depression - I have had a lot of time to be alone and think, and I don't think my depression is the main problem, I've had it all my life.  I've been very introspective the last several days, and it's been clear to me that I have problems, but I think I have more than most people.  I'm always going to have depression, but the anxiety is what kicks my butt.  People are judgment of me, they are so busy telling me what I should be doing, the anxiety is what I'm concerned about.  Depression 0 - Anxiety 9.  I can't just say I'm depressed, I don't want to take antidepressants, I can intellectualize what's going on with me.  I've got to get the anxiety under control.  I've had a lot of energy, gotten so much done in the last few days.  I'm doing these acts of desperation to stay alive - I'm not suicidal.  No voices."  "I'm suffering now partly because of that oil in my skin, what I'm so frantic about, I haven't had much help from the outside with my treatments.  Supposedly my doctor's office had given the information to the doctor in [place], but he's 56 years old and grouchy, and said he didn't get the copies.  Had to call them immediately but he wasn't much interested.  Nobody knows where I stand and I'm not going back and nobody knows where I stand with that.  I think they've been laughing privately about me, because he was grouchy and the thing that concerned me was my skin.  I have this feeling he has a preconceived idea about me.  I'm seeing a new doctor, Dr. Lovell Sheehan, and I was in her office and there were 3 other ladies there, and she said 'Monic have you had a pap test lately?'  She didn't remember that she had done one recently, and told me that I had a prolapsed uterus.  The other ladies just looked at her strangely and  walked out of the room.  I don't know if they're related in some way, but 5 days later I got a certified letter from her saying I have too many medical problems and she can't be my doctor anymore.  "My psychiatrist Jamas Lav kind of upset me last time I was there, she just lost her patience with me, she said 'it's clear we need to change your medicine again.'  She just flat out told me 'no you don't, you don't have any skin problems, if you want more medicine I'll give it to you.'  So I'm not going back to her so I don't have a psychiatrist and I don't have a doctor."  "I was handcuffed in the emergency room last night.  Willing to take medications for anxiety today.  Nobody can understand me because I'm talking so fast.  So thirsty.  My mind is going faster than I can talk.  My skin is not really a problem now, I've been doing really well, I'm proud of how I've handled all this.  So much has happened since I last saw you" (addressed to doctor).  "There seems to be salt in my skin.  I've been eating fresh fruits and vegetables and drinking a lot of  water."  Further notes on interview:  Patient reports she is not taking any medications at home, but does need synthroid.  States she does need something for anxiety, but refuses anything for depression.  Is living by self in [place].  Acknowledges that her brother does not know where she is, and that he did not know where she was on last admission, which upset him.  States she will call him immediately to let him know.  States everybody on the team is always very helpful to her.  Ambrose Mantle, LCSW 11/14/2011, 11:46 AM

## 2011-11-14 NOTE — Discharge Planning (Signed)
Patient has been accepted to [facility] to Dr. Allena Katz  bed 406-1. Patient's support paperwork has been completed. EDP notified and is in agreement with disposition. EDP will discharge pt to [facility]. Pt nurse notified as well. ALL appropriate paperwork completed and forwarded to [facility] for review.   Ileene Hutchinson , MSW, LCSWA 11/14/2011 8:12 AM [phone]

## 2011-11-14 NOTE — Progress Notes (Addendum)
Patient ID: Rebekah Pace, female   DOB: July 09, 1956, 56 y.o.   MRN: [id number] Repeat EKG completed.  NSR at 99 beats/min.  QTc 459.  No evidence of pvc's or other acute findings. Will start Geodon 60mg  BID with meals, and Dr. Allena Katz concurs.

## 2011-11-14 NOTE — ED Notes (Signed)
Pt. Ate 50% of bkft.
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
## 2011-11-15 DIAGNOSIS — F259 Schizoaffective disorder, unspecified: Principal | ICD-10-CM

## 2011-11-15 LAB — T3, FREE: T3, Free: 3.2 pg/mL (ref 2.3–4.2)

## 2011-11-15 MED ORDER — ZIPRASIDONE MESYLATE 20 MG IM SOLR: 20.0000 mg | Freq: Two times a day (BID) | INTRAMUSCULAR | Status: DC | PRN

## 2011-11-15 MED ORDER — POTASSIUM CHLORIDE CRYS ER 20 MEQ PO TBCR
20.0000 meq | EXTENDED_RELEASE_TABLET | Freq: Two times a day (BID) | ORAL | Status: AC
  Administered 2011-11-15 – 2011-11-16 (×3): 20 meq via ORAL
  Filled 2011-11-15 (×3): qty 1

## 2011-11-15 MED ORDER — ZIPRASIDONE HCL 60 MG PO CAPS
60.0000 mg | ORAL_CAPSULE | Freq: Two times a day (BID) | ORAL | Status: DC
  Administered 2011-11-16 – 2011-11-20 (×9): 60 mg via ORAL
  Filled 2011-11-15 (×11): qty 1

## 2011-11-15 NOTE — Progress Notes (Signed)
Writer entered patients room to find her walking around in her room preoccupied with cleaning it up. Patient requested more towels which she stated that she needed to finish cleaning her room in which writer explained that her room looked clean and she already had two towels anyway. Patient was asked how she felt and she reported that she was tired but was busy moving items around as if she was organizing and cleaning up. Writer asked if patient was in any pain and she reported that she was not in pain, denies si/hi/a/v hall. Writer informed patient of her scheduled 2200 medications and patient reported that she would not be taking those medications because she did not need them.  Writer encouraged patient to think about it and hopefully will change her mind. Writer also noticed that patients hair is terribly matted and does not appear  to have been combed in quite a while.  Safety maintained on unit, will continue to monitor.

## 2011-11-15 NOTE — Progress Notes (Signed)
[facility] MD Progress Note  11/15/2011 3:22 PM  Diagnosis:  Axis I: Schizoaffective Disorder - Bipolar Type.   The patient was seen today and reports the following:   Sleep: The patient reports to having significant difficulty initiating and maintaining sleep. Appetite: The patient reports an improved appetite.   Mild>(1-10) >Severe  Hopelessness (1-10): 0  Depression (1-10): 0  Anxiety (1-10): 5.   Suicidal Ideation: The patient adamantly denies any suicidal ideations today.  Plan: No  Intent: No  Means: No   Homicidal Ideation: The patient adamantly denies any homicidal ideations today.  Plan: No  Intent: No.  Means: No   Eye Contact: Good.  General Appearance/Behavior: Remains disshelved.  Motor Behavior: Moderately hyperactive.  Speech: Moderately increased in rate and volume with moderate pressuring noted.  Mental Status: Alert and Oriented x 3  Level of Consciousness: Alert  Mood: Moderately Manic.  Affect: Moderately Expansive.  Anxiety: Moderate Anxiety Reported.  Thought Process: Coherent  Thought Content: WNL. No auditory or visual hallucinations noted. The patient continues to report delusional thoughts that "oil is oozing from my skin" but this is much improved.  Perception: Somewhat delusional.  Judgment: Fair to Poor.  Insight: Poor.  Cognition: Orientated to time, place and person.  Sleep:  Number of Hours: 2.75    Vital Signs:Blood pressure 115/76, pulse 106, temperature 97.8 F (36.6 C), temperature source Oral, resp. rate 16, height 5\' 2"  (1.575 m), weight 62.143 kg (137 lb), SpO2 100.00%. Current Medications: Current Facility-Administered Medications  Medication Dose Route Frequency Provider Last Rate Last Dose  . acetaminophen (TYLENOL) tablet 650 mg  650 mg Oral Q6H PRN Jorje Guild, PA      . alum & mag hydroxide-simeth (MAALOX/MYLANTA) 200-200-20 MG/5ML suspension 30 mL  30 mL Oral Q4H PRN Jorje Guild, PA      . feeding supplement [facility] IMMUNE HEALTH)  liquid 237 mL  237 mL Oral TID WC Hollye Pritt, MD   237 mL at 11/15/11 1200  . haloperidol lactate (HALDOL) injection 5 mg  5 mg Intramuscular Q6H PRN Viviann Spare, NP      . levothyroxine (SYNTHROID, LEVOTHROID) tablet 75 mcg  75 mcg Oral Daily Franchot Gallo, MD   75 mcg at 11/15/11 0836  . LORazepam (ATIVAN) tablet 1 mg  1 mg Oral Q4H PRN Viviann Spare, NP      . magnesium hydroxide (MILK OF MAGNESIA) suspension 30 mL  30 mL Oral Daily PRN Jorje Guild, PA      . potassium chloride SA (K-DUR,KLOR-CON) CR tablet 20 mEq  20 mEq Oral BID Franchot Gallo, MD      . traZODone (DESYREL) tablet 200 mg  200 mg Oral QHS Jorje Guild, PA      . vitamin B-12 (CYANOCOBALAMIN) tablet 100 mcg  100 mcg Oral Daily Jorje Guild, PA   100 mcg at 11/14/11 1338  . ziprasidone (GEODON) capsule 60 mg  60 mg Oral BID WC Franchot Gallo, MD      . DISCONTD: clonazePAM (KLONOPIN) tablet 0.5 mg  0.5 mg Oral [id number] Viviann Spare, NP      . DISCONTD: levothyroxine (SYNTHROID, LEVOTHROID) tablet 75 mcg  75 mcg Oral QAC breakfast Franchot Gallo, MD      . DISCONTD: potassium chloride SA (K-DUR,KLOR-CON) CR tablet 20 mEq  20 mEq Oral BID Franchot Gallo, MD   20 mEq at 11/14/11 1722  . DISCONTD: risperiDONE (RISPERDAL) tablet 1 mg  1 mg Oral QHS Viviann Spare, NP      .  DISCONTD: risperiDONE (RISPERDAL) tablet 1 mg  1 mg Oral Q6H PRN Viviann Spare, NP      . DISCONTD: ziprasidone (GEODON) capsule 40 mg  40 mg Oral BID WC Franchot Gallo, MD   40 mg at 11/14/11 1722   Lab Results:  Results for orders placed during the hospital encounter of 11/14/11 (from the past 48 hour(s))  URINALYSIS, ROUTINE W REFLEX MICROSCOPIC     Status: Abnormal   Collection Time   11/14/11  7:05 PM      Component Value Range Comment   Color, Urine YELLOW  YELLOW     APPearance CLEAR  CLEAR     Specific Gravity, Urine 1.010  1.005 - 1.030     pH 6.0  5.0 - 8.0     Glucose, UA NEGATIVE  NEGATIVE (mg/dL)    Hgb urine dipstick TRACE (*)  NEGATIVE     Bilirubin Urine NEGATIVE  NEGATIVE     Ketones, ur NEGATIVE  NEGATIVE (mg/dL)    Protein, ur NEGATIVE  NEGATIVE (mg/dL)    Urobilinogen, UA 0.2  0.0 - 1.0 (mg/dL)    Nitrite NEGATIVE  NEGATIVE     Leukocytes, UA MODERATE (*) NEGATIVE    URINE MICROSCOPIC-ADD ON     Status: Abnormal   Collection Time   11/14/11  7:05 PM      Component Value Range Comment   Squamous Epithelial / LPF RARE  RARE     WBC, UA 3-6  <3 (WBC/hpf)    Bacteria, UA FEW (*) RARE    TSH     Status: Normal   Collection Time   11/14/11  7:30 PM      Component Value Range Comment   TSH 4.500  0.350 - 4.500 (uIU/mL)   T3, FREE     Status: Normal   Collection Time   11/14/11  7:30 PM      Component Value Range Comment   T3, Free 3.2  2.3 - 4.2 (pg/mL)   T4, FREE     Status: Normal   Collection Time   11/14/11  7:30 PM      Component Value Range Comment   Free T4 1.47  0.80 - 1.80 (ng/dL)   BASIC METABOLIC PANEL     Status: Abnormal   Collection Time   11/14/11  7:30 PM      Component Value Range Comment   Sodium 131 (*) 135 - 145 (mEq/L)    Potassium 3.0 (*) 3.5 - 5.1 (mEq/L)    Chloride 95 (*) 96 - 112 (mEq/L)    CO2 25  19 - 32 (mEq/L)    Glucose, Bld 97  70 - 99 (mg/dL)    BUN 13  6 - 23 (mg/dL)    Creatinine, Ser 1.61  0.50 - 1.10 (mg/dL)    Calcium 9.5  8.4 - 10.5 (mg/dL)    GFR calc non Af Amer 59 (*) >90 (mL/min)    GFR calc Af Amer 68 (*) >90 (mL/min)    Time was spent today discussing with the patient the need for her to take her medications.  The patient initially stated she would take the medication Geodon but is now refusing.  Staff has attempted to discuss with the patient the need for medications.  The patient is under an involuntary commitment order.  If she continues to refuse, a forced medication order will be requested from another Physician.  Treatment Plan Summary:  1. Daily contact with patient to assess and  evaluate symptoms and progress in treatment  2. Medication  management  3. The patient will deny suicidal ideations or homicidal ideations for 48 hours prior to discharge and have a depression and anxiety rating of 3 or less. The patient will also deny any auditory or visual hallucinations or delusional thinking or display any manic or hypomanic behaviors.   Plan:  1. Will increase Geodon to 60 mgs po BID with meals as a mood stabilizer.  2. Will start KCL 20 mEQ po BID x 3 doses for low potassium.  This was previously ordered but the patient refused the medication. 3. Will discontinue Klonopin since the patient is refusing this medication. 4. Will order a forced medication order if the patient continues to refuse medications. 5. Will continue to monitor.   Miguel Medal 11/15/2011, 3:22 PM

## 2011-11-15 NOTE — Progress Notes (Signed)
Patient ID: Rebekah Pace, female   DOB: 24-Apr-1956, 56 y.o.   MRN: [id number] Has been pleasant and cooperative this evening, worried about her legs which are reddened and swollen, skin cracking over areas of flexion.  Was encouraged to sit with legs elevated, neosporin to cracked areas to promote healing, covered cold packs applied and was given tylenol for discomfort. Encouraged fluids and was given ice water and ginger ale.  Is comfortable, having a snack, denies SI at this time, working on combing the tangles out of her hair, still appears disheveled, but states feeling a little better.  Will continue to monitor.

## 2011-11-15 NOTE — Progress Notes (Signed)
[facility] Group Notes:  (Counselor/Nursing/MHT/Case Management/Adjunct)  11/15/2011 8:30 AM  Type of Therapy:  Group therapy, Music therapy 11/14/2011  Participation Level:  Did Not Attend   Rebekah Pace 11/15/2011, 8:30 AM

## 2011-11-15 NOTE — Progress Notes (Signed)
[facility] Group Notes:  (Counselor/Nursing/MHT/Case Management/Adjunct)  11/15/2011 1:08 PM  Type of Therapy:  Group therapy  Participation Level:  Active  Participation Quality:  Attentive and Sharing  Affect:  Blunted  Cognitive:  Oriented  Insight:  Limited  Engagement in Group:  Good  Engagement in Therapy:  Good  Modes of Intervention:  Education, Problem-solving and Support  Summary of Progress/Problems: Pt was active in discussing patterns of expressing anger, Related to others as she talked about letting things "fester". Helpful with other peers as they discussed dysfunctional ways of expressing anger.   Kary Sugrue, Aram Beecham 11/15/2011, 1:08 PM

## 2011-11-15 NOTE — Progress Notes (Signed)
Adult Psychosocial Assessment Update Interdisciplinary Team  Previous [facility] admissions/discharges:  Admissions Discharges  Date:09/12/11 Date:  Date:06/04/11 Date:  Date: Date:  Date: Date:  Date: Date:   Changes since the last Psychosocial Assessment (including adherence to outpatient mental health and/or substance abuse treatment, situational issues contributing to decompensation and/or relapse). Medical problems  Increased anxiety           Discharge Plan 1. Will you be returning to the same living situation after discharge?   Yes:x No:      If no, what is your plan?    Lives alone       2. Would you like a referral for services when you are discharged? Yes:     If yes, for what services?  No:       Patient is not happy with [facility]. Wants to change professionals       Summary and Recommendations (to be completed by the evaluator) Patient is a 56 year old white female with diagnosis of Schizoaffective D/O-Bipolar Type. She was brought by a friend due to her isolating and not talking to people for days. Patient continues to be obsessed with her skin, Hepatitis C and other medical problems. Patient has stopped taking her anti-depressants but identified anxiety as a problem. Recommended crises stabilization, medication evaluation, group therapy and psycho-education groups to work on coping skills and case management for referrals.                       Signature:  [name], 11/15/2011 1:21 PM

## 2011-11-15 NOTE — Discharge Planning (Signed)
During Aftercare Planning Group, Case Manager provided psychoeducation on "Suicide Prevention Information."  This included descriptions of risk factors for suicide, warning signs that an individual is in crisis and thinking of suicide, and what to do if this occurs.  Pt indicated understanding of information provided, and will read brochure given upon discharge.   Patient participated actively in the discussion.  Patient states today that she wants to focus on her physical ailments, and she thinks it will be best to continue to go to Dr. [facility] for her psychiatric care for now.  She also was grateful about the Treatment Team yesterday encouraging her to contact her brother about being at [facility].  She stated that helped her to focus on the most important thing, and that she is so lacking in focus right now, it was helpful.  He does now know that she is at [facility], and is helping to take care of things while she is here.  No case management needs today.  Ambrose Mantle, LCSW 11/15/2011, 2:47 PM

## 2011-11-15 NOTE — Progress Notes (Signed)
Pt is not attending groups. Pt is refusing most of her medication. Pt is continually picking and trying to dry off her skin. Pt is still very anxious and denies depression. Pt has to be redirected and has a hard time focusing. Pt was offered support and encouragement. Pt safety maintained on unit.

## 2011-11-15 NOTE — Progress Notes (Signed)
Patient ID: Rebekah Pace, female   DOB: 1955-10-24, 56 y.o.   MRN: [id number]  Efforts were made to convince the patient to take po Geodon.  The patient continues to refuse.  In my opinion, the medication is necessary for stabilization and for the overall safety of the patient.  I support the need for forced medications if the patient refuses her po Geodon.    I will request that the on call Psychiatrist re-evaluate the patient in the morning and if they agree, to provide a second opinion for the need of a forced medication order.  Please note, the patient is under an involuntary commitment order which is necessary for forced medications.

## 2011-11-15 NOTE — Progress Notes (Signed)
Pt continues to refuse po geodon and k+. Explained procedure for forced meds. Pt became irritable and angry.  Obsessively cleaning room and speech is pressured.  Appearance is clean but dishevleled.  Cont to encourage po meds and redirect aggressive behavior. Cont 15' checks for safety.

## 2011-11-16 NOTE — Progress Notes (Signed)
Patient ID: Rebekah Pace, female   DOB: [birth year], 56 y.o.   MRN: [id number]  [facility] Group Notes:  (Counselor/Nursing/MHT/Case Management/Adjunct)  11/16/2011 11 AM  Type of Therapy:  Group Therapy, Dance/Movement Therapy   Participation Level:  Active  Participation Quality:  Sharing  Affect:  Appropriate  Cognitive:  Oriented  Insight:  Limited  Engagement in Group:  Good  Engagement in Therapy:  Good  Modes of Intervention:  Clarification, Problem-solving, Role-play, Socialization and Support  Summary of Progress/Problems:  Group enacted activities they enjoy to release physical tension as well as to embody tasks to use when feeling stuck. Pt shared that she likes to ice skate and do ballet. She was somewhat unbalanced when moving across the floor, but she was able to maintain some control. This suggests that she is unclear of what she needs in order to continue in recovery.  Thomasena Edis, [employer]

## 2011-11-16 NOTE — Progress Notes (Addendum)
Patient ID: Rebekah Pace, female   DOB: 10-29-1955, 56 y.o.   MRN: [id number] Has been sleeping this evening, legs remain reddened, skin cracked, esp over areas of flexion, are elevated on a pillow, and open to air.   Slept most of the night, woke up around 0300, had wet the bed, states she had a dream that she was in the bathroom.  Is now awake, sitting up in chair with legs elevated.  Neosporin applied again tonight, legs seem to be improving slightly, states they feel much better. Pleasant, combing out her hair, which is matted.  Will continue to monitor.

## 2011-11-16 NOTE — Progress Notes (Signed)
Rebekah Pace had been refusing her medication. A forced medication order had been written. Today the nurse reports her potassium was abnormally low. A stat potassium lab test was ordered after patient was given supplemental potassium. The report was returned with level within normal limits. The Geodon medication was offered and patient agreed to take after her lunch.  Patient is interviewed in her room. She is oriented to person place and situation. Her speech is slow but clear and organized. She is moving with psychomotor retardation as well. She fumbles with her bedding gown and robe and she tries to get on and off her bed. She behaves as though she's not quite sure whether she wants to set or lay down. She finally arranges her pillows and lays down. All the time she is manipulating these, and she is talking. She says the Geodon has helped her feel more even and relaxed. She is encouraged to appreciate the medication has helped relieve some of her anxiety. She denies any racing thoughts, denies suicidal/homicidal ideation. She denies auditory hallucinations. She said her appetite has improved and she has been sleeping off and on. She is increased to think about the next dose of Geodon which will be offered when she has dinner.  The purpose of the meeting when taking Geodon was explained to enhance the effectiveness of the medication. She indicates that she is thinking about taking it.  The nurse reports that she has plans to eat dinner and has taken the medication Geodon. There has been no need for a forced medication today.

## 2011-11-16 NOTE — Progress Notes (Signed)
Checked K+ level and is up to 4.1, patient took Geodon early afternoon once MD back and able to discuss K+ level, compliant w/all meds ordered by MD, denies Si or HI, cooperative, smiling and talking o this nurse, pleasant worried about her legs and treatment. q53min safety checks continue and support offered Safety maintained

## 2011-11-17 LAB — COMPREHENSIVE METABOLIC PANEL
BUN: 9 mg/dL (ref 6–23)
CO2: 29 mEq/L (ref 19–32)
Chloride: 98 mEq/L (ref 96–112)
Creatinine, Ser: 0.88 mg/dL (ref 0.50–1.10)
GFR calc non Af Amer: 72 mL/min — ABNORMAL LOW (ref >90)
Total Bilirubin: 0.3 mg/dL (ref 0.3–1.2)

## 2011-11-17 NOTE — Progress Notes (Signed)
Patient ID: ADALENE GULOTTA, female   DOB: 1956-08-04, 56 y.o.   MRN: [id number]   [facility] Group Notes:  (Counselor/Nursing/MHT/Case Management/Adjunct)  11/17/2011 11 AM  Type of Therapy:  Group Therapy, Dance/Movement Therapy   Participation Level:  Active  Participation Quality:  Drowsy and Sharing  Affect:  Appropriate  Cognitive:  Oriented  Insight:  Limited  Engagement in Group:  Good  Engagement in Therapy:  Good  Modes of Intervention:  Clarification, Problem-solving, Role-play, Socialization and Support  Summary of Progress/Problems: Group focused on what makes a external support healthy or unhealthy. The group then practiced how to show others the ways in which they need to be supported, as well as how to give themselves support. Pt reported that she was "in and out" of feeling sleepy and awake. Pt would vary from being fully engaged to the group process, and then would appear to be having difficulty remaining awake. Pt needs some stability for recovery.  Thomasena Edis, [employer]

## 2011-11-17 NOTE — Progress Notes (Signed)
Rebekah Pace is taking a shower.  She has matted top knot ~ 6 in. Tall [inverted ponytail] with tangled strands of hair hanging around her face.  She offers no answer about difficulty of getting her hair combed.  She perseverates about "her skin condition"  She describes 'barbs' under her skin related to her Hepatitis.  She has no named MD to evaluate her hepatitis status.  She denies suicidal ideation.  She denies AH/VH.  She says she feels good and wants a record review of how she has lost weight.  She perseverates also about the 'healthy foods' she has been eating.  She is oriented to person, place, date; not situation.  Before ending interview she wants to show her infected feet and ask for more pain medication.  She has a blunted affect, normal rate of speech with poor insight and evidence of thought - delusional disorder. Her room has been found with urine on the floor and she is in a DO NOT ADMIT room.

## 2011-11-17 NOTE — Progress Notes (Signed)
Pt was up in her room upon first assessment.  She was given her self-inventory to fill out.  She wrote n/a by depression and hopelessness and rated hr anxiety a 3.  She denied any A/V hallucinations and denied any S/H ideations.  She does seem a bit confused and disorganized.  She did request tylenol for pain in her BLE which was given around 0849 she rated it a 6 and upon reassessment she was down to a 4.  It has been reported that pt is incontinent of urine at times but was not noted thus far today.  She did go to group this morning.

## 2011-11-18 ENCOUNTER — Other Ambulatory Visit: Payer: Self-pay

## 2011-11-18 MED ORDER — LORAZEPAM 0.5 MG PO TABS: 0.5000 mg | ORAL_TABLET | Freq: Four times a day (QID) | ORAL | Status: DC | PRN
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
## 2011-11-18 NOTE — Discharge Planning (Signed)
Met with patient in Aftercare Planning Group.   However, she was extremely sedated, with head tilted in an uncomfortable-looking position to the side.  When patient could not be awakened, Case Manager asked MHT to escort her to her room to rest.  Did utilization review for additional days.  Ambrose Mantle, LCSW 11/18/2011, 2:35 PM

## 2011-11-18 NOTE — Progress Notes (Signed)
In hallway on approach. Calm and cooperative with assessment. No acute distress noted. States she felt like she had a bad day r/t feeling exhausted, but states she has felt much better as the day went on. States her goal is get good rest and be up bright and early in AM so she can get herself together before breakfast. States she does feel like she is feels more clear today vs previous day. Support and encouragement provided. Did endorse pain in the ribs and requested prn which was provided. Denies having showered this afternoon/evening (excessive showering reported by dayshift). Otherwise no questions or concerns expressedDenies SI/HI/AVH and contracts for safety. POC and medications for the shift reviewed and understanding verbalized. Safety has been maintained with Q15 minute observation. Will f/u response to prn for pain and continue current POC.
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
## 2011-11-18 NOTE — Progress Notes (Signed)
Patient ID: Rebekah Pace, female   DOB: 25-Oct-1955, 56 y.o.   MRN: [id number] Pt denied depression, hopelessness, and SI on her self-inventory.  She also rated her sleep as fair, appetite as good, and her ability to pay attention as good.  Rebekah Pace stayed in her room in bed during lunch due to 'not feeling well'.  Her lunch was brought to her room and her mother visited while she ate, who was very supportive.  EKG orders and labs were placed to further assess the pt.  She was out of bed in the day room earlier and stated she complained of generalized pain--relieved with acetaminophen.  Rebekah Pace stated she felt like she was on the verge of turning around and feeling better.  Pt remains cooperative with treatment and pleasant.

## 2011-11-18 NOTE — Progress Notes (Signed)
[facility] Group Notes:  (Counselor/Nursing/MHT/Case Management/Adjunct)  11/18/2011 3:04 PM  Type of Therapy:  Group Therapy  Participation Level:  None  Participation Quality:  Pt came to group late. Pt then slept through group.   Timmothy Baranowski, Aram Beecham 11/18/2011, 3:04 PM

## 2011-11-19 MED ORDER — FRUIT OF THE EARTH/ALOE VERA EX LOTN
1.0000 "application " | TOPICAL_LOTION | CUTANEOUS | Status: DC
  Administered 2011-11-19 – 2011-11-25 (×8): 1 via CUTANEOUS
  Filled 2011-11-19 (×15): qty 1

## 2011-11-19 MED ORDER — DOXYCYCLINE HYCLATE 100 MG PO TABS
100.0000 mg | ORAL_TABLET | Freq: Two times a day (BID) | ORAL | Status: DC
  Administered 2011-11-19 – 2011-11-25 (×13): 100 mg via ORAL
  Filled 2011-11-19 (×15): qty 1

## 2011-11-19 MED ORDER — CEPHALEXIN 500 MG PO CAPS: 500.0000 mg | ORAL_CAPSULE | Freq: Three times a day (TID) | ORAL | Status: DC

## 2011-11-19 MED FILL — Emollient - Lotion: CUTANEOUS | Qty: 340 | Status: AC

## 2011-11-19 NOTE — Progress Notes (Signed)
In hallway on approach. Appears flat and depressed. Calm and cooperative with assessment. No acute distress noted. States she has had a good day. Went off the unit for a doppler study and states she enjoyed getting off unit and seeing some trees. Support and encouragement provided. Feet with some swelling and redness noted. Also noted was some small areas of granulating tissue. Aloe vera applied and covered with stockinettes and red socks per NP order. Tolerated well and seemed to get great relief from the treatment. When writer entered room, some puddling of water noted. Assisted pt with drying it and writer advised pt against allowing her floor to become saturated as she could slip and further complicate her wounds. Did endorse pain in her feet and requested prn which was provided. Offered no questions or concerns. Denies SI/HI/AVH and contracts for safety. POC and medications for the shift reviewed and understanding verbalized. Safety has been maintained with Q15 minute observation. Will f/u response to prn and cont to monitor per POC.

## 2011-11-19 NOTE — Progress Notes (Signed)
Patient ID: Rebekah Pace, female   DOB: 05/12/1956, 56 y.o.   MRN: [id number]  Spoke with Dr. Clarisse Gouge MD, Internal Medicine who will see patient today and requested I switch from Keflex to Doxycyline, and will start that today.
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
## 2011-11-19 NOTE — Progress Notes (Signed)
[facility] Group Notes:  (Counselor/Nursing/MHT/Case Management/Adjunct)  11/19/2011 11:13 AM  Type of Therapy:  Group Therapy  Participation Level:  Active  Participation Quality:  Attentive, Monopolizing and Sharing  Affect:  Depressed  Cognitive:  Oriented  Insight:  Limited  Engagement in Group:  Good  Engagement in Therapy:  Good  Modes of Intervention:  Education, Problem-solving and Support  Summary of Progress/Problems: Patient talked about being so busy with her house, yard, and taking care of her dog that she had let herself go. Plans to take more time for herself. Also talked about her emotional self and how she had never talked about her grief for her father that died in 24. Felt relief that she had been able to share with the group. Feels she has gotten some respite from her son who is now going to [facility] and doing something productive. Patient talked about not putting herself first because of worrying about what people thought and because she didn't think she deserved it. Patient was surprised that she was saying this and sharing so much with the group.   Rebekah Pace, Aram Beecham 11/19/2011, 11:13 AM

## 2011-11-19 NOTE — Progress Notes (Signed)
SW met with pt at this time.  SW observed pt moving the pillows and blankets off her bed and placing them throughout the room.  Pt was pleasant and excited to have someone to talk to.  Pt had a flight of ideas, talking about her hair style, oils in her skin, Hepititis C and her experience with coming here involuntarily.  Pt states that her friend gets concerned about her not leaving the house and gets her committed here every time.  Pt states that really she doesn't want to spend as much time with him as he does with her and he calls for help when pt declines to come out to see him.  Pt states that she is considering filing a restraining order on him.  Pt then asked if she answered SW's question, which was how she was doing today.  Pt thanked SW, stating that she feels so much better after talking to someone and someone listening to her.  Pt then began to cry and say this happens from time to time.    Reyes Ivan, LCSWA 11/19/2011  1:46 PM

## 2011-11-19 NOTE — Progress Notes (Signed)
VASCULAR LAB PRELIMINARY  PRELIMINARY  PRELIMINARY  PRELIMINARY  Bilateral lower extremity venous duplex  completed.    Preliminary report:  Left:  No evidence of DVT, superficial thrombosis, or Baker's cyst.   Terance Hart, RVT 11/19/2011, 5:22 PM

## 2011-11-19 NOTE — Progress Notes (Signed)
Patient up and about in the milieu today.  Has also spent a great deal of time in her room and particularly the bathroom.  She has clothing lying all over her room.  At one point I entered the room and she was changing clothes but walked around for quite a while with the door open and her breasts exposed.  I did close the door and reminded patient that she should stay covered at all times when her door is open.  Dressing applied to bilateral lower extremities this morning.  Patient was also seen by a specialist today and placed on oral antibiotics.  She has received Tylenol twice for complaints of back discomfort.  Went for doppler studies of her legs this afternoon.

## 2011-11-20 LAB — URINALYSIS, ROUTINE W REFLEX MICROSCOPIC
Leukocytes, UA: NEGATIVE
Protein, ur: NEGATIVE mg/dL
Urobilinogen, UA: 0.2 mg/dL (ref 0.0–1.0)

## 2011-11-20 MED ORDER — ZIPRASIDONE HCL 80 MG PO CAPS
80.0000 mg | ORAL_CAPSULE | Freq: Two times a day (BID) | ORAL | Status: DC
  Administered 2011-11-20 – 2011-11-25 (×10): 80 mg via ORAL
  Filled 2011-11-20 (×13): qty 1

## 2011-11-20 MED ORDER — LEVOTHYROXINE SODIUM 75 MCG PO TABS
75.0000 ug | ORAL_TABLET | Freq: Every day | ORAL | Status: DC
  Administered 2011-11-21 – 2011-11-25 (×5): 75 ug via ORAL
  Filled 2011-11-20 (×6): qty 1

## 2011-11-20 NOTE — Discharge Summary (Signed)
Physician Discharge Summary Note  Patient:  Rebekah Pace is an 56 y.o., female MRN:  [id number] DOB:  1956-09-05 Patient phone:  [phone] (home)  Patient address:   [address] [place] [address],   Date of Admission:  09/12/2011 Date of Discharge: 09/16/2011  Discharge Diagnoses: Principal Problem:  *Schizoaffective disorder, bipolar type   Axis Diagnosis:   AXIS I:  Schizoaffective d/o, bipolar type AXIS II:  No diagnosis AXIS III:  Hepatitis C Past Medical History  Diagnosis Date  . Thyroid disease   . Anxiety   . Mental disorder   . Depression   . Hepatitis    AXIS IV:  Deferred, stable home and supportive brother is an asset AXIS V:  51-60 moderate symptoms  Level of Care:  OP  Hospital Course:   [name] presented with an exacerbation of her schizoaffective disorder, sleep had deteriorated, and she was very preoccupied with feeling that her skin was particularly orderly and this made her feel dirty, and germy. She was also having a lot of obsessive thinking about her hepatitis C diagnosis and fearful and concerned about what this meant for her.   She was admitted for acute stabilization unit and we continued her Synthroid, started her on trazodone 200 mg each bedtime and ziprasidone, which he tolerated well. She stabilized without complications, and was ready for discharge to outpatient followup by December 3.   Consults:  None  Significant Diagnostic Studies:  thyroid panel was normal.  Discharge Vitals:   Blood pressure 98/64, pulse 97, temperature 97.8 F (36.6 C), temperature source Oral, resp. rate 20, height 5\' 5"  (1.651 m), weight 72.576 kg (160 lb).  Mental Status Exam: See Mental Status Examination and Suicide Risk Assessment completed by Attending Physician prior to discharge.  Discharge destination:  Home  Is patient on multiple antipsychotic therapies at discharge:  No   Has Patient had three or more failed trials of antipsychotic  monotherapy by history:  No  Recommended Plan for Multiple Antipsychotic Therapies: n/a  Discharge Orders    Future Orders Please Complete By Expires   Diet - low sodium heart healthy        Medication List  As of 11/20/2011  3:01 PM   START taking these medications         cyanocobalamin 100 MCG tablet   Take 1 tablet (100 mcg total) by mouth daily.         CHANGE how you take these medications         traZODone 100 MG tablet   Commonly known as: DESYREL   Take 2 tablets (200 mg total) by mouth at bedtime. For anxiety   What changed: - how often to take the med - reasons to take the med         CONTINUE taking these medications         levothyroxine 75 MCG tablet   Commonly known as: SYNTHROID, LEVOTHROID      sertraline 100 MG tablet   Commonly known as: ZOLOFT         STOP taking these medications         ALPRAZolam 1 MG tablet          Where to get your medications    These are the prescriptions that you need to pick up.   You may get these medications from any pharmacy.         cyanocobalamin 100 MCG tablet   traZODone 100 MG tablet  Follow-up Information    Follow up with Dr. Raquel James on 09/18/2011. (9:30 AM  Wednesday  )    Contact information:   [facility] [address]      [place], [place] [address] [phone]          Follow-up recommendations:  Activity:  unrestricted Diet:  regular   Signed: Edona Schreffler A 11/20/2011, 3:01 PM

## 2011-11-20 NOTE — Progress Notes (Addendum)
Patient ID: Rebekah Pace, female   DOB: 23-Jul-1956, 56 y.o.   MRN: [id number] Pt is awake and active on the unit this AM. Pt is attending groups and is cooperative with staff. Pt denies SI/Hi and AVH. Pt states that she plans to return to her previous living arraignments. Pt lives by herself. Writer replaced bandages on bilateral lower legs and applied aloe vera ointment to burns. Pt expressed relief from symptoms. Pt states that her mood has improved greatly since arriving at [facility]. Writer will continue to monitor. MHT informed writer that pt requested medication this afternoon. Writer entered pt room to summon her to the med window and pt's backside was exposed and she was leaning over her dresser. Pt acted startled when I entered. Writer counseled pt that she should ensure  she was covered up at all times. Pt apologized and agreed to comply.

## 2011-11-20 NOTE — Progress Notes (Signed)
SW met with pt individually at this time.  Pt presents with calm mood and affect.  Pt denies depression, anxiety and SI.  Pt states she is feeling much better and is on the "up, up, up!"  Pt states that she feels her medication is working and has improved mood.  Pt states that she has follow up with Dr. Raquel James at [facility].  Pt states she would like a referral for a therapist, stating she saw Hal Neer at [facility] years ago.  SW contacted [facility] and was informed pt missed her last appointment on 1/8 with Dr. Raquel James and is unable to return until she pays a late fee of $130.  SW contacted [facility] to inquire about pt seeing Hal Neer.  SW was informed Hal Neer is not accepting new clients at this time, but SW left a voicemail message for Hal Neer to see if she would be willing to see pt or not.  SW will continue to monitor for appropriate follow up.  No further needs at this time.   Rebekah Pace, LCSWA 11/20/2011  11:43 AM

## 2011-11-20 NOTE — Progress Notes (Signed)
[facility] Group Notes:  (Counselor/Nursing/MHT/Case Management/Adjunct)  11/20/2011 3:10 PM  Type of Therapy:  Group Therapy  Participation Level:  Did Not Attend   [name] 11/20/2011, 3:10 PM
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
## 2011-11-20 NOTE — Progress Notes (Signed)
Recreation Therapy Notes  11/20/2011         Time: 0930      Group Topic/Focus: The focus of the group is on enhancing the patients' ability to cope with stressors by understanding what coping is, why it is important, the negative effects of stress and developing healthier coping skills.   Participation Level: Minimal  Participation Quality: Monopolizing  Affect: Excited  Cognitive: Oriented  Additional Comments: Patient hyperverbal, talking about how much better she feels than when she came in. Patient missed the majority of group as she was meeting with her doctor.   Neila Teem 11/20/2011 10:20 AM

## 2011-11-20 NOTE — Progress Notes (Signed)
In room, looking at a magazine on approach. Appears flat, but does brighten slightly at intervals. Calm and cooperative with assessment. No acute distress noted. States she continues to feel better every day. Red socks and and stockinettes in place. Skin on BL feet/ankles continues to appear red with area of granulating tissue noted. Some edema noted but it appears to be less than previous assessment by this Clinical research associate. Denies pain. Discussed dressing changes and is agreeable to do them after wrap up group. Otherwise, no questions or concerns expressed. Denies SI/HI/AVH and contracts for safety. POC and medications for the shift reviewed and understanding verbalized. Safety has been maintained with Q15 minute observation. Will continue to monitor per POC.

## 2011-11-20 NOTE — Progress Notes (Signed)
[facility] Group Notes:  (Counselor/Nursing/MHT/Case Management/Adjunct)  11/20/2011 3:11 PM  Type of Therapy:  Psychoeducational Skills  Participation Level:  None. Patient slept through mental health association presentation   Rebekah Pace 11/20/2011, 3:11 PM

## 2011-11-20 NOTE — Progress Notes (Signed)
[facility] MD Progress Note  11/20/2011 2:51 PM  Diagnosis:  Axis I: Schizoaffective Disorder - Bipolar Type.   The patient was seen today and reports the following:   Sleep: The patient reports to sleeping reasonably well at night.  Appetite: The patient reports a good appetite.   Mild>(1-10) >Severe  Hopelessness (1-10): 0  Depression (1-10): 0  Anxiety (1-10): 0   Suicidal Ideation: The patient adamantly denies any suicidal ideations today.  Plan: No  Intent: No  Means: No   Homicidal Ideation: The patient adamantly denies any homicidal ideations today.  Plan: No  Intent: No.  Means: No   Eye Contact: Good.  General Appearance/Behavior: Remains somewhat disshelved but improving.  Motor Behavior: Appropriate today.  Speech: Appropriate in rate and volume with very mild pressuring noted.  Mental Status: Alert and Oriented x 3  Level of Consciousness: Alert  Mood: Mildly manic.  Affect: Mildly Expansive.  Anxiety: No anxiety noted or reported.  Thought Process: Coherent  Thought Content: WNL. No auditory or visual hallucinations noted. No delusions noted today.  Perception: wnl.  Judgment: Fair.  Insight: Fair.  Cognition: Orientated to time, place and person.  Sleep:  Number of Hours: 5.25    Vital Signs:Blood pressure 119/78, pulse 103, temperature 98.6 F (37 C), temperature source Oral, resp. rate 18, height 5\' 2"  (1.575 m), weight 62.143 kg (137 lb), SpO2 100.00%. Current Medications: Current Facility-Administered Medications  Medication Dose Route Frequency Provider Last Rate Last Dose  . acetaminophen (TYLENOL) tablet 650 mg  650 mg Oral Q6H PRN Jorje Guild, PA   650 mg at 11/20/11 0757  . alum & mag hydroxide-simeth (MAALOX/MYLANTA) 200-200-20 MG/5ML suspension 30 mL  30 mL Oral Q4H PRN Jorje Guild, PA      . doxycycline (VIBRA-TABS) tablet 100 mg  100 mg Oral Q12H Viviann Spare, NP   100 mg at 11/20/11 0756  . feeding supplement (ENSURE IMMUNE HEALTH) liquid 237 mL   237 mL Oral TID WC Franchot Gallo, MD   237 mL at 11/20/11 1127  . Fruit of the Earth/Aloe Vera LOTN 1 application  1 application Apply externally BH-qamhs Viviann Spare, NP   1 application at 11/20/11 0800  . levothyroxine (SYNTHROID, LEVOTHROID) tablet 75 mcg  75 mcg Oral QAC breakfast Delyla Sandeen, MD      . magnesium hydroxide (MILK OF MAGNESIA) suspension 30 mL  30 mL Oral Daily PRN Jorje Guild, PA   30 mL at 11/18/11 0746  . traZODone (DESYREL) tablet 200 mg  200 mg Oral QHS Jorje Guild, PA   200 mg at 11/19/11 2215  . vitamin B-12 (CYANOCOBALAMIN) tablet 100 mcg  100 mcg Oral Daily Jorje Guild, PA   100 mcg at 11/20/11 0756  . ziprasidone (GEODON) capsule 80 mg  80 mg Oral BID WC Ras Kollman, MD      . ziprasidone (GEODON) injection 20 mg  20 mg Intramuscular Q12H PRN Franchot Gallo, MD      . DISCONTD: levothyroxine (SYNTHROID, LEVOTHROID) tablet 75 mcg  75 mcg Oral Daily Franchot Gallo, MD   75 mcg at 11/20/11 0756  . DISCONTD: ziprasidone (GEODON) capsule 60 mg  60 mg Oral BID WC Franchot Gallo, MD   60 mg at 11/20/11 1610   Lab Results: No results found for this or any previous visit (from the past 48 hour(s)).  Time was spent today discussing with the patient her current symptoms.  The patient states that she is "doing well" but would  like to have her Geodon increased for more mood stabilization.  Also Internal Medicine is following the patient for her possible cellulitis.  Treatment Plan Summary:  1. Daily contact with patient to assess and evaluate symptoms and progress in treatment  2. Medication management  3. The patient will deny suicidal ideations or homicidal ideations for 48 hours prior to discharge and have a depression and anxiety rating of 3 or less. The patient will also deny any auditory or visual hallucinations or delusional thinking or display any manic or hypomanic behaviors.   Plan:  1. Will increase Geodon to 80 mgs po BID with meals for further mood  stabilization..  2. Will continue to monitor.  3. Internal Medicine is following the patient for her possible cellulitis.  Ziaire Hagos 11/20/2011, 2:51 PM

## 2011-11-21 NOTE — Progress Notes (Signed)
Pt has been up and has been active and has been participating in various milieu activities today, she did mention about going somewhere close to have her hepatitis treated, did speak about feeling the medications are helping her with her anxiety, denies any suicidal thoughts, support provided, will continue to monitor
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
## 2011-11-21 NOTE — Progress Notes (Signed)
[facility] Group Notes:  (Counselor/Nursing/MHT/Case Management/Adjunct)  11/21/2011 12:46 PM  Type of Therapy:  Music Therapy  Participation Level:  Active  Participation Quality:  Appropriate  Affect:  Appropriate  Cognitive:  Oriented  Insight:  Good  Engagement in Group:  Good  Engagement in Therapy:  Good  Modes of Intervention:  Activity, Socialization, Support and Music  Summary of Progress/Problems: Patient was very appreciative of the music played on keyboard by therapist. Patient chose several selections and sang along with some. She assisted therapist with holding books, turning pages, etc. She moved to some of the music and stated she couldn't believe how the music had made her body loosen up. She talked about the improvements overall of her skin and physical problems. As she listened she also shared about certain memories with certain songs.   Rebekah Pace 11/21/2011, 12:46 PM
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
## 2011-11-21 NOTE — Progress Notes (Signed)
Pt at times seems flat but does brighten on approach. Pt is pleasant and cooperative. Pt was changed from a do not admit earlier in the day and was recently moved to another room in which she has a roommate. Pt attends groups and interacts well with staff and others. Pt receptive to treatment and safety maintained on unit.

## 2011-11-21 NOTE — Progress Notes (Signed)
[facility] Group Notes:  (Counselor/Nursing/MHT/Case Management/Adjunct)  11/21/2011 12:45 PM  Type of Therapy:  Group Therapy  Participation Level:  Did Not Attend    [name] 11/21/2011, 12:45 PM

## 2011-11-22 NOTE — Discharge Planning (Signed)
Met with patient in Aftercare Planning Group.   She reported feeling better, would like to go to court on Monday on her Involuntary Commitment if still here.  Asked her if she is willing to sign in voluntarily, and she agreed that she would rather to that.  Case Manager to discuss with doctor.  Patient states she can take care of the $130 owed to [facility] in order to see Dr. Raquel James.  CM advised having an appointment at [facility] in case that proves difficult, and patient was in agreement.  Awaiting that appointment now.  During Aftercare Planning Group, Case Manager provided psychoeducation on "Suicide Prevention Information."  This included descriptions of risk factors for suicide, warning signs that an individual is in crisis and thinking of suicide, and what to do if this occurs.  Pt indicated understanding of information provided, and will read brochure given upon discharge.  However, she also expressed deep concern about her brother being suicidal at times due to the accident he had which has so drastically changed his life.  CM gave extra support and ideas for what to do when he expresses those thoughts, and also pointed out how this increases her stressors and can trigger an episode for her.  Patient also asks for a letter to her insurance company about having been involuntarily committed, to be provided at discharge.  Ambrose Mantle, LCSW 11/22/2011, 9:38 AM

## 2011-11-22 NOTE — Progress Notes (Signed)
[facility] Group Notes:  (Counselor/Nursing/MHT/Case Management/Adjunct)  11/22/2011 12:26 PM  Type of Therapy:  Group Therapy  Participation Level:  Active  Participation Quality:  Appropriate  Affect:  Blunted  Cognitive:  Oriented  Insight:  Limited  Engagement in Group:  Good  Engagement in Therapy:  Good  Modes of Intervention:  Education, Problem-solving and Support  Summary of Progress/Problems: Patient stated that she wanted to make a testament for music therapy because she is feeling so much better after the music. She talked about having more mobility which has made it easier for her to get around on the unit. Also happy about move closer to the nursing station. Patient is clearer in thinking and all discussion was relevant to discussion.   [name] 11/22/2011, 12:26 PM

## 2011-11-22 NOTE — Progress Notes (Signed)
Patient resting quietly with eyes closed. Respirations even and unlabored. No distress noted. q 15 minute checks continues as ordered to maintain safety. 

## 2011-11-22 NOTE — Progress Notes (Signed)
Recreation Therapy Notes  11/22/2011         Time: 1125      Group Topic/Focus: The focus of the group is on enhancing the patients' ability to cope with stressors by understanding what coping is, why it is important, the negative effects of stress and developing healthier coping skills.  Participation Level: Active  Participation Quality: Resistant  Affect: Irritable  Cognitive: Oriented   Additional Comments: Patient focused on the fact that group was starting late and was very concerned it would interfere with her getting to lunch on time. RT assured patients they would be able to get lunch, patient remained argumentative, reminding RT numerous times what time they needed to line up for lunch.   Rebekah Pace 11/22/2011 12:19 PM

## 2011-11-22 NOTE — Progress Notes (Signed)
[facility] MD Progress Note  11/22/2011 1:46 PM  Diagnosis:  Axis I: Schizoaffective Disorder - Bipolar Type.   The patient was seen today and reports the following:   Sleep: The patient reports to sleeping reasonably well at night.  Appetite: The patient reports a good appetite.   Mild>(1-10) >Severe  Hopelessness (1-10): 0  Depression (1-10): 0  Anxiety (1-10): 0   Suicidal Ideation: The patient adamantly denies any suicidal ideations today.  Plan: No  Intent: No  Means: No   Homicidal Ideation: The patient adamantly denies any homicidal ideations today.  Plan: No  Intent: No.  Means: No   Eye Contact: Good.  General Appearance/Behavior: Remains somewhat disshelved.  Motor Behavior: Appropriate today.  Speech: Appropriate in rate and volume with very mild pressuring noted.  Mental Status: Alert and Oriented x 3  Level of Consciousness: Alert  Mood: Mildly manic.  Affect: Mildly Expansive but improving.  Anxiety: No anxiety noted or reported.  Thought Process: Coherent  Thought Content: WNL. No auditory or visual hallucinations noted. No delusions noted today.  Perception: wnl.  Judgment: Fair to Good.  Insight: Fair to Good.  Cognition: Orientated to time, place and person.  Sleep:  Number of Hours: 4.75    Vital Signs:Blood pressure 111/70, pulse 106, temperature 96.5 F (35.8 C), temperature source Oral, resp. rate 18, height 5\' 2"  (1.575 m), weight 62.143 kg (137 lb), SpO2 100.00%.  Current Medications: Current Facility-Administered Medications  Medication Dose Route Frequency Provider Last Rate Last Dose  . acetaminophen (TYLENOL) tablet 650 mg  650 mg Oral Q6H PRN Jorje Guild, PA   650 mg at 11/22/11 0939  . alum & mag hydroxide-simeth (MAALOX/MYLANTA) 200-200-20 MG/5ML suspension 30 mL  30 mL Oral Q4H PRN Jorje Guild, PA      . doxycycline (VIBRA-TABS) tablet 100 mg  100 mg Oral Q12H Viviann Spare, NP   100 mg at 11/22/11 1610  . feeding supplement (ENSURE IMMUNE  HEALTH) liquid 237 mL  237 mL Oral TID WC Franchot Gallo, MD      . Fruit of the Earth/Aloe Dwana Curd LOTN 1 application  1 application Apply externally BH-qamhs Viviann Spare, NP   1 application at 11/21/11 2205  . levothyroxine (SYNTHROID, LEVOTHROID) tablet 75 mcg  75 mcg Oral QAC breakfast Franchot Gallo, MD   75 mcg at 11/22/11 [phone]  . magnesium hydroxide (MILK OF MAGNESIA) suspension 30 mL  30 mL Oral Daily PRN Jorje Guild, PA   30 mL at 11/18/11 0746  . traZODone (DESYREL) tablet 200 mg  200 mg Oral QHS Jorje Guild, PA   200 mg at 11/21/11 2202  . vitamin B-12 (CYANOCOBALAMIN) tablet 100 mcg  100 mcg Oral Daily Jorje Guild, PA   100 mcg at 11/22/11 5409  . ziprasidone (GEODON) capsule 80 mg  80 mg Oral BID WC Franchot Gallo, MD   80 mg at 11/22/11 0936  . ziprasidone (GEODON) injection 20 mg  20 mg Intramuscular Q12H PRN Franchot Gallo, MD       Lab Results: No results found for this or any previous visit (from the past 48 hour(s)).  Time was spent today discussing with the patient her ongoing improvements.  The patient states that she is very pleased with the care she has received at [facility] and feels she may be ready for discharge by Monday.  Treatment Plan Summary:  1. Daily contact with patient to assess and evaluate symptoms and progress in treatment  2. Medication management  3. The patient will deny suicidal ideations or homicidal ideations for 48 hours prior to discharge and have a depression and anxiety rating of 3 or less. The patient will also deny any auditory or visual hallucinations or delusional thinking or display any manic or hypomanic behaviors.   Plan:  1. Will continue current medications. 2. Will continue to monitor.  3. Probable discharge on Monday. 4. Will allow the patient to sign for voluntary care.  Torie Priebe 11/22/2011, 1:46 PM

## 2011-11-22 NOTE — Progress Notes (Signed)
Patient ID: Rebekah Pace, female   DOB: [birth year], 56 y.o.   MRN: [id number] 11/22/2011  Nursing [name] has been out in the milieu...attneding her groups. Interacting with other pts and the staff. She is appropriate and takes her meds. She contracts for safety. She can be labile.Marland Kitchenabdominal tenderness times rambling in speech and sometimes loses her thought.Marland KitchenMarland KitchenBut states that she sees the difference today...and feels she is getting better. A Per MD , she was allowed to sign voluntary request. She completed her self inventory and on it she wrote that she denied SI, that her depression and hopelessness were " n/a " . R Safety is maintaiend and POC includes fostering therapeutic relationship currently established PD RN [facility]
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
## 2011-11-23 NOTE — Progress Notes (Signed)
Patient ID: Rebekah Pace, female   DOB: 1955/11/21, 56 y.o.   MRN: [id number]  Pt. attended and participated in aftercare planning group. Pt. accepted information on suicide prevention, warning signs to look for with suicide and crisis line numbers to use. The pt. agreed to call crisis line numbers if having warning signs or having thoughts of suicide.   Pt is planning to be D/C on Monday (November 25, 2011). She will be going home and will be picked up by her niece, [name]. Pt has an appointment on November 28, 2011 at [facility]. Rebekah Pace needs a letter verifying her dates of admission in the [place] system for her insurance. She is also requesting a letter for the dates of her last admission.

## 2011-11-23 NOTE — Progress Notes (Signed)
Patient ID: ACQUANETTA CABANILLA, female   DOB: 1956-04-20, 56 y.o.   MRN: [id number]  [facility] Group Notes:  (Counselor/Nursing/MHT/Case Management/Adjunct)  11/23/2011 11 AM  Type of Therapy:  Group Therapy, Dance/Movement Therapy   Participation Level:  Active  Participation Quality:  Appropriate  Affect:  Appropriate  Cognitive:  Oriented  Insight:  Good  Engagement in Group:  Good  Engagement in Therapy:  Good  Modes of Intervention:  Clarification, Problem-solving, Role-play, Socialization and Support  Summary of Progress/Problems:  Group expressed feelings through movement as a means to release negative thoughts and feelings. Through group interaction, this allowed them to progress towards a more positive feeling state. Pt showed that she was unaware of her feelings because she could not repeat and could not remember what she shared, right after she shared it; however after the experiential, she was more clear and very assured of how she was feeling. This suggests that the pt needs stability and structure in order to be successful in her recovery.  Thomasena Edis, [employer]

## 2011-11-23 NOTE — Progress Notes (Signed)
Patient ID: Rebekah Pace, female   DOB: 1956/03/06, 56 y.o.   MRN: [id number] The patient spent most of the evening in bed resting with eyes closed. She needed to be awakened for her medication. Her feet still are red and have edema present but show signs of improving. Her mood and affect are neutral. Thoughts are relevant and logical. Denies any thoughts of self harm. Denies any A/V hallucinations.

## 2011-11-23 NOTE — Progress Notes (Signed)
Pt has been calm and cooperative today  She is still a bit disorganized in her thoughts but has improved a lot   She has isolated some today but her interactions have been appropriate  She is compliant with treatment attends group and takes her medications  She said the geodon is helping her to grasp hold of things and believes it has helped her think more clearly   Verbal support given  medications administered and effectiveness monitored   Q 15 min checks   Pt safe at present time
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
## 2011-11-24 NOTE — Progress Notes (Signed)
Patient ID: Rebekah Pace, female   DOB: Nov 18, 1955, 56 y.o.   MRN: [id number]  [facility] Group Notes:  (Counselor/Nursing/MHT/Case Management/Adjunct)  11/24/2011 11 AM  Type of Therapy:  Group Therapy, Dance/Movement Therapy   Participation Level:  Active  Participation Quality:  Appropriate  Affect:  Appropriate  Cognitive:  Appropriate  Insight:  Good  Engagement in Group:  Good  Engagement in Therapy:  Good  Modes of Intervention:  Clarification, Problem-solving, Role-play, Socialization and Support  Summary of Progress/Problems: Therapist discussed ways healthy supports can help aid in the recovery process and to name one healthy support that aids them individually.  Pt. stated that "sitting by the ocean, fishing and laying in the sun" helps to aid her recovery  Pt. stated that " sleeping" is a healthy method of support to help aid in his specific recovery today.       Rhunette Croft
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
## 2011-11-25 MED ORDER — TRAZODONE HCL 100 MG PO TABS: 200.0000 mg | ORAL_TABLET | Freq: Every day | ORAL | Status: DC

## 2011-11-25 MED ORDER — LEVOTHYROXINE SODIUM 75 MCG PO TABS: 75.0000 ug | ORAL_TABLET | Freq: Every day | ORAL | Status: DC

## 2011-11-25 MED ORDER — FRUIT OF THE EARTH/ALOE VERA EX LOTN: 1.0000 "application " | TOPICAL_LOTION | CUTANEOUS | Status: DC

## 2011-11-25 MED ORDER — ZIPRASIDONE HCL 80 MG PO CAPS: 80.0000 mg | ORAL_CAPSULE | Freq: Two times a day (BID) | ORAL | Status: DC

## 2011-11-25 MED ORDER — CYANOCOBALAMIN 100 MCG PO TABS: 100.0000 ug | ORAL_TABLET | Freq: Every day | ORAL | Status: DC

## 2011-11-25 MED ORDER — DOXYCYCLINE HYCLATE 100 MG PO TABS: 100.0000 mg | ORAL_TABLET | Freq: Two times a day (BID) | ORAL | Status: DC

## 2011-11-25 NOTE — Progress Notes (Signed)
Pt was discharged home today. She denied any S/I H/I or A/V hallucinations.  She was given f/u appointment, rx, sample medications, and hotline info booklet. She voiced understanding to all instructions provided.  She declined the need for smoking cessation materials.  

## 2011-11-25 NOTE — Progress Notes (Signed)
[facility] Case Management Discharge Plan:  Will you be returning to the same living situation after discharge: Yes,  lives alone At discharge, do you have transportation home?:Yes,  friend [name] to pick up Do you have the ability to pay for your medications:Yes,  has insurance and income  Interagency Information:     Release of information consent forms completed and in the chart;  Patient's signature needed at discharge.  Patient to Follow up at:  Follow-up Information    Follow up with Dr. Dicky Doe at [facility] on 11/28/2011. (2:30PM appointment; arrived 30 minutes early)    Contact information:   [address] [place]  [address] Telephone:  [phone]      Follow up with advanced home health care. (will contact you within 48 hours.  Physical Therapy will evaluate and treat.  RN will draw Basic Metabolic Panel on Thurs. 11/28/2011.)          Patient denies SI/HI:   Yes,      Safety Planning and Suicide Prevention discussed:  Yes,  During multiple groups,patient was provided psychoeducation on "Suicide Prevention Information."  This included descriptions of risk factors for suicide, warning signs that an individual is in crisis and thinking of suicide, and what to do if this occurs.  Pt indicated understanding of information provided, and will read brochure given upon discharge.     Barrier to discharge identified:No.  Summary and Recommendations:  Letter re hospitalization given to patient for insurance company at her request.  Patient advised to go to [facility] as much as possible, and to follow up with [facility] as shown above until she feels she can pay [facility] for the no-show fees and resume treatment there.  Patient also requested and was given information on how to apply for disability.   Sarina Ser 11/25/2011, 12:45 PM

## 2011-11-25 NOTE — Progress Notes (Signed)
Recreation Therapy Notes  11/25/2011         Time: 0930      Group Topic/Focus: The focus of this group is on discussing various styles of communication and communicating assertively using 'I' (feeling) statements.  Participation Level: Minimal  Participation Quality: Appropriate  Affect: Irritable  Cognitive: Oriented   Additional Comments: Patient routinely only comes to the latter half of group, then wants to know everything that happened prior to her coming.   Patrick Salemi 11/25/2011 12:34 PM

## 2011-11-25 NOTE — Tx Team (Signed)
Interdisciplinary Treatment Plan Update (Adult)  Date:  11/25/2011  Time Reviewed:  10:15AM-11:00AM  Progress in Treatment: Attending groups:  Yes Participating in groups:    Yes Taking medication as prescribed:    Yes Tolerating medication:   Yes Family/Significant other contact made:  Yes Patient understands diagnosis:   Yes Discussing patient identified problems/goals with staff:   Yes Medical problems stabilized or resolved:   Yes Denies suicidal/homicidal ideation:  Yes Issues/concerns per patient self-inventory:   Yes Other:  New problem(s) identified: No, Describe:    Reason for Continuation of Hospitalization: None  Interventions implemented related to continuation of hospitalization:  Medication monitoring and adjustment, safety checks Q15 min., suicide risk assessment, group therapy, psychoeducation, collateral contact, aftercare planning, ongoing physician assessments, medication education - until leaves at 12:30  Additional comments:  Not applicable  Estimated length of stay:  Discharge today  Discharge Plan:  Will be picked up by friend Viviann Spare at 12:30PM and go to her apartment; follow up at [facility] and either old PCP or [facility] (is being arranged by Nurse Practitioner prior to discharge)  New goal(s):  Not applicable  Review of initial/current patient goals per problem list:   1.  Goal(s):  Reduce anxiety from "overwhelming" at admission to no more than 3 at discharge.  Met:  No  Target date:  By Discharge   As evidenced by:  5-6 today, although she believes this is only because she is anxious about leaving the hospital; during treatment team states her anxiety is already down to a 3  2.  Goal(s):  Reduce manic symptoms to baseline.  Met:  Yes  Target date:  By Discharge   As evidenced by:  No longer manic  3.  Goal(s):  Determine aftercare for psychiatric and medical  Met:  Yes  Target date:  By Discharge   As evidenced by:  Aftercare is set  at [facility], and patient will pay off [facility] and then would like to return there; medical care will be set up by NP before departure  4.  Goal(s):  Get appropriate treatment for feet  Met:  Yes  Target date:  By Discharge   As evidenced by:  Treated and stable  Attendees: Patient:  Rebekah Pace  11/25/2011 10:30AM  Family:     Physician:  Dr. Harvie Heck Readling 11/25/2011 10:30AM  Nursing:   Robbie Louis, RN 11/25/2011 10:30AM    Case Manager:  Ambrose Mantle, LCSW 11/25/2011 10:30AM  Counselor:  [name], MT-BC 11/25/2011 10:30AM  Other:   Tacy Learn, RN 11/25/2011 10:30AM  Other:   Lynann Bologna, NP 11/25/2011 10:30AM  Other:      Other:       Scribe for Treatment Team:   Sarina Ser, 11/25/2011, 11:16 AM

## 2011-11-25 NOTE — Progress Notes (Signed)
Pt was up in room most of the evening. She is pleasant and appropriate though thinking remains slightly disorganized with mild confusion. She is denying any AVH and reports her mood is "good." Meds given without difficulty. Pt given simple directions and reinforced patiently as needed. She denies SI/HI and remains safe. Lawrence Marseilles

## 2011-11-25 NOTE — [facility] Suicide Risk Assessment (Signed)
Suicide Risk Assessment  Discharge Assessment     Demographic factors:  Assessment Details Time of Assessment: Admission Information Obtained From: Patient Current Mental Status:  Current Mental Status:  (Denies SI/HI) Risk Reduction Factors:  Risk Reduction Factors: Religious beliefs about death;Positive social support;Positive therapeutic relationship  CLINICAL FACTORS:   Severe Anxiety and/or Agitation Previous Psychiatric Diagnoses and Treatments Schizoaffective Disorder - Bipolar Type.  COGNITIVE FEATURES THAT CONTRIBUTE TO RISK:  None Noted.  Diagnosis:  Axis I: Schizoaffective Disorder - Bipolar Type.   The patient was seen today and reports the following:   Sleep: The patient reports to sleeping reasonably well at night.  Appetite: The patient reports a good appetite.   Mild>(1-10) >Severe  Hopelessness (1-10): 0  Depression (1-10): 0  Anxiety (1-10): 3  Suicidal Ideation: The patient adamantly denies any suicidal ideations today.  Plan: No  Intent: No  Means: No   Homicidal Ideation: The patient adamantly denies any homicidal ideations today.  Plan: No  Intent: No.  Means: No   Eye Contact: Good.  General Appearance/Behavior: Slightly disshelved. Motor Behavior: Appropriate today.  Speech: Appropriate in rate and volume with very mild pressuring noted.  Mental Status: Alert and Oriented x 3  Level of Consciousness: Alert  Mood: Essentially Euthymic. Affect: Bright and Full. Anxiety: Mild today. Thought Process: Coherent  Thought Content: WNL. No auditory or visual hallucinations noted. No delusions noted today.  Perception: wnl.  Judgment: Fair to Good.  Insight: Fair to Good.  Cognition: Oriented to time, place and person.   Lab Results: No results found for this or any previous visit (from the past 48 hour(s)).   Time was spent today discussing with the patient her ongoing improvements.  The patient states that she feels "much better" and is ready  for discharge.  She plans to follow up with [facility] for her mental health follow up.  Treatment Plan Summary:  1. Daily contact with patient to assess and evaluate symptoms and progress in treatment  2. Medication management  3. The patient will deny suicidal ideations or homicidal ideations for 48 hours prior to discharge and have a depression and anxiety rating of 3 or less. The patient will also deny any auditory or visual hallucinations or delusional thinking or display any manic or hypomanic behaviors.   Plan:  1. Will continue current medications.  2. Will continue to monitor.  3. Discharge today.   SUICIDE RISK:   Minimal: No identifiable suicidal ideation.  Patients presenting with no risk factors but with morbid ruminations; may be classified as minimal risk based on the severity of the depressive symptoms   Rebekah Pace 11/25/2011, 12:38 PM

## 2011-11-27 ENCOUNTER — Emergency Department (HOSPITAL_COMMUNITY)
Admission: EM | Admit: 2011-11-27 | Discharge: 2011-11-28 | Disposition: A | Discharge status: Home / Self Care | Payer: Medicaid Other | Attending: Emergency Medicine | Admitting: Emergency Medicine

## 2011-11-27 ENCOUNTER — Encounter (HOSPITAL_COMMUNITY): Payer: Self-pay | Admitting: *Deleted

## 2011-11-27 DIAGNOSIS — F419 Anxiety disorder, unspecified: Secondary | ICD-10-CM

## 2011-11-27 DIAGNOSIS — IMO0002 Reserved for concepts with insufficient information to code with codable children: Secondary | ICD-10-CM | POA: Insufficient documentation

## 2011-11-27 DIAGNOSIS — F411 Generalized anxiety disorder: Secondary | ICD-10-CM | POA: Insufficient documentation

## 2011-11-27 DIAGNOSIS — Z8619 Personal history of other infectious and parasitic diseases: Secondary | ICD-10-CM | POA: Insufficient documentation

## 2011-11-27 DIAGNOSIS — F22 Delusional disorders: Secondary | ICD-10-CM | POA: Insufficient documentation

## 2011-11-27 LAB — COMPREHENSIVE METABOLIC PANEL
AST: 55 U/L — ABNORMAL HIGH (ref 0–37)
Albumin: 3.8 g/dL (ref 3.5–5.2)
Chloride: 102 mEq/L (ref 96–112)
Creatinine, Ser: 0.77 mg/dL (ref 0.50–1.10)
Potassium: 3.4 mEq/L — ABNORMAL LOW (ref 3.5–5.1)
Total Bilirubin: 0.5 mg/dL (ref 0.3–1.2)

## 2011-11-27 LAB — CBC
MCV: 85.6 fL (ref 78.0–100.0)
Platelets: 296 10*3/uL (ref 150–400)
RDW: 13.9 % (ref 11.5–15.5)
WBC: 8.7 10*3/uL (ref 4.0–10.5)

## 2011-11-27 LAB — RAPID URINE DRUG SCREEN, HOSP PERFORMED
Amphetamines: NOT DETECTED
Barbiturates: NOT DETECTED
Tetrahydrocannabinol: NOT DETECTED

## 2011-11-27 MED ORDER — HYDROCERIN EX CREA
TOPICAL_CREAM | Freq: Two times a day (BID) | CUTANEOUS | Status: DC
  Administered 2011-11-27: 21:00:00 via TOPICAL
  Filled 2011-11-27: qty 113

## 2011-11-27 MED ORDER — ONDANSETRON HCL 4 MG PO TABS: 4.0000 mg | ORAL_TABLET | Freq: Three times a day (TID) | ORAL | Status: DC | PRN

## 2011-11-27 MED ORDER — TRAZODONE HCL 100 MG PO TABS
200.0000 mg | ORAL_TABLET | Freq: Every day | ORAL | Status: DC
  Administered 2011-11-27: 200 mg via ORAL
  Filled 2011-11-27: qty 2

## 2011-11-27 MED ORDER — LEVOTHYROXINE SODIUM 75 MCG PO TABS: 75.0000 ug | ORAL_TABLET | Freq: Every day | ORAL | Status: DC

## 2011-11-27 MED ORDER — LEVOTHYROXINE SODIUM 75 MCG PO TABS
75.0000 ug | ORAL_TABLET | Freq: Every day | ORAL | Status: DC
  Filled 2011-11-27: qty 1

## 2011-11-27 MED ORDER — IBUPROFEN 600 MG PO TABS: 600.0000 mg | ORAL_TABLET | Freq: Three times a day (TID) | ORAL | Status: DC | PRN

## 2011-11-27 MED ORDER — DOXYCYCLINE HYCLATE 100 MG PO TABS
100.0000 mg | ORAL_TABLET | Freq: Two times a day (BID) | ORAL | Status: DC
  Administered 2011-11-27: 100 mg via ORAL
  Filled 2011-11-27: qty 1

## 2011-11-27 MED ORDER — FRUIT OF THE EARTH/ALOE VERA EX LOTN: 1.0000 "application " | TOPICAL_LOTION | CUTANEOUS | Status: DC

## 2011-11-27 MED ORDER — ZIPRASIDONE HCL 20 MG PO CAPS
80.0000 mg | ORAL_CAPSULE | Freq: Two times a day (BID) | ORAL | Status: DC
  Administered 2011-11-27: 80 mg via ORAL
  Filled 2011-11-27: qty 4
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
## 2011-11-28 NOTE — ED Provider Notes (Signed)
Medical screening examination/treatment/procedure(s) were performed by non-physician practitioner and as supervising physician I was immediately available for consultation/collaboration.  Ethelda Chick, MD 11/28/11 [phone]

## 2011-11-28 NOTE — ED Notes (Signed)
Tele-psych recommends discharge with plan to follow up with outpatient therapist and psychatrist. EDP has been notified and is in agreement.

## 2011-11-28 NOTE — Discharge Instructions (Signed)
Return to the ED with any concerns including thoughts or feelings of suicide, or any other alarming symptoms °

## 2011-11-28 NOTE — ED Notes (Signed)
Pt discharged at recommendation of telepsych. Pt states she will follow up with OP today. Departs unit in stable condition, no complaints.

## 2011-11-28 NOTE — ED Provider Notes (Signed)
Pt has had telepsych consult performed.  This states patient is cleared for discharge from a psychiatric standpoint- she has an appointment tomorrow scheduled with her psychiatrist which she was encouraged to attend  Ethelda Chick, MD 11/28/11 [phone]

## 2011-11-29 DIAGNOSIS — M7989 Other specified soft tissue disorders: Secondary | ICD-10-CM

## 2011-11-29 NOTE — Progress Notes (Signed)
Patient Discharge Instructions:  Admission Note Faxed,  11/28/2011 After Visit Summary Faxed,  11/28/2011 Faxed to the [facility] Care provider:  11/28/2011 D/C Summary Note faxed 11/28/2011 Facesheet faxed 11/28/2011  Faxed to [facility] - Dr. Dicky Doe @ [phone]  [name], 11/29/2011, 2:26 PM

## 2011-12-02 ENCOUNTER — Emergency Department (HOSPITAL_COMMUNITY): Payer: Medicaid Other

## 2011-12-02 ENCOUNTER — Encounter (HOSPITAL_COMMUNITY): Payer: Self-pay

## 2011-12-02 ENCOUNTER — Other Ambulatory Visit: Payer: Self-pay

## 2011-12-02 ENCOUNTER — Emergency Department (HOSPITAL_COMMUNITY)
Admission: EM | Admit: 2011-12-02 | Discharge: 2011-12-02 | Disposition: A | Discharge status: Other Acute Inpatient Hospital | Payer: Medicaid Other | Attending: Emergency Medicine | Admitting: Emergency Medicine

## 2011-12-02 DIAGNOSIS — F419 Anxiety disorder, unspecified: Secondary | ICD-10-CM

## 2011-12-02 DIAGNOSIS — N390 Urinary tract infection, site not specified: Secondary | ICD-10-CM

## 2011-12-02 DIAGNOSIS — F411 Generalized anxiety disorder: Secondary | ICD-10-CM | POA: Insufficient documentation

## 2011-12-02 DIAGNOSIS — F22 Delusional disorders: Secondary | ICD-10-CM

## 2011-12-02 DIAGNOSIS — R21 Rash and other nonspecific skin eruption: Secondary | ICD-10-CM | POA: Insufficient documentation

## 2011-12-02 LAB — RAPID URINE DRUG SCREEN, HOSP PERFORMED
Barbiturates: NOT DETECTED
Benzodiazepines: POSITIVE — AB
Cocaine: NOT DETECTED
Tetrahydrocannabinol: POSITIVE — AB

## 2011-12-02 LAB — URINALYSIS, ROUTINE W REFLEX MICROSCOPIC
Glucose, UA: NEGATIVE mg/dL
Hgb urine dipstick: NEGATIVE
Protein, ur: NEGATIVE mg/dL

## 2011-12-02 LAB — CBC
MCH: 28.6 pg (ref 26.0–34.0)
Platelets: 354 10*3/uL (ref 150–400)
RBC: 4.05 MIL/uL (ref 3.87–5.11)
WBC: 4.9 10*3/uL (ref 4.0–10.5)

## 2011-12-02 LAB — COMPREHENSIVE METABOLIC PANEL
ALT: 30 U/L (ref 0–35)
AST: 37 U/L (ref 0–37)
Alkaline Phosphatase: 54 U/L (ref 39–117)
CO2: 23 mEq/L (ref 19–32)
Calcium: 9.3 mg/dL (ref 8.4–10.5)
GFR calc non Af Amer: >90 mL/min (ref >90)
Potassium: 3.6 mEq/L (ref 3.5–5.1)
Sodium: 136 mEq/L (ref 135–145)

## 2011-12-02 MED ORDER — CIPROFLOXACIN HCL 250 MG PO TABS: 250.0000 mg | ORAL_TABLET | Freq: Two times a day (BID) | ORAL | Status: AC

## 2011-12-02 NOTE — ED Notes (Signed)
Pt sent here from [facility] for medical clearance, pt will be going back to [facility]

## 2011-12-02 NOTE — Discharge Instructions (Signed)

## 2011-12-02 NOTE — ED Notes (Signed)
Results faxed to [facility] 
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
## 2011-12-15 ENCOUNTER — Encounter (HOSPITAL_COMMUNITY): Payer: Self-pay | Admitting: *Deleted

## 2011-12-15 ENCOUNTER — Emergency Department (HOSPITAL_COMMUNITY)
Admission: EM | Admit: 2011-12-15 | Discharge: 2011-12-16 | Disposition: A | Discharge status: Other Acute Inpatient Hospital | Payer: Medicaid Other | Source: Home / Self Care | Attending: Emergency Medicine | Admitting: Emergency Medicine

## 2011-12-15 DIAGNOSIS — F22 Delusional disorders: Secondary | ICD-10-CM | POA: Insufficient documentation

## 2011-12-15 DIAGNOSIS — IMO0002 Reserved for concepts with insufficient information to code with codable children: Secondary | ICD-10-CM | POA: Insufficient documentation

## 2011-12-15 DIAGNOSIS — G319 Degenerative disease of nervous system, unspecified: Secondary | ICD-10-CM | POA: Insufficient documentation

## 2011-12-15 DIAGNOSIS — R0602 Shortness of breath: Secondary | ICD-10-CM | POA: Insufficient documentation

## 2011-12-15 DIAGNOSIS — F411 Generalized anxiety disorder: Secondary | ICD-10-CM | POA: Insufficient documentation

## 2011-12-15 DIAGNOSIS — F29 Unspecified psychosis not due to a substance or known physiological condition: Secondary | ICD-10-CM

## 2011-12-15 LAB — DIFFERENTIAL
Lymphocytes Relative: 33 % (ref 12–46)
Lymphs Abs: 2.8 10*3/uL (ref 0.7–4.0)
Neutrophils Relative %: 53 % (ref 43–77)

## 2011-12-15 LAB — COMPREHENSIVE METABOLIC PANEL
ALT: 22 U/L (ref 0–35)
Alkaline Phosphatase: 68 U/L (ref 39–117)
CO2: 26 mEq/L (ref 19–32)
Chloride: 100 mEq/L (ref 96–112)
GFR calc Af Amer: >90 mL/min (ref >90)
GFR calc non Af Amer: >90 mL/min (ref >90)
Glucose, Bld: 94 mg/dL (ref 70–99)
Potassium: 3.3 mEq/L — ABNORMAL LOW (ref 3.5–5.1)
Sodium: 135 mEq/L (ref 135–145)
Total Protein: 7.2 g/dL (ref 6.0–8.3)

## 2011-12-15 LAB — CBC
Platelets: 262 10*3/uL (ref 150–400)
RBC: 4.16 MIL/uL (ref 3.87–5.11)
WBC: 8.3 10*3/uL (ref 4.0–10.5)

## 2011-12-15 LAB — RAPID URINE DRUG SCREEN, HOSP PERFORMED
Barbiturates: POSITIVE — AB
Cocaine: NOT DETECTED
Tetrahydrocannabinol: NOT DETECTED

## 2011-12-15 MED ORDER — ZOLPIDEM TARTRATE 5 MG PO TABS: 5.0000 mg | ORAL_TABLET | Freq: Every evening | ORAL | Status: DC | PRN

## 2011-12-15 MED ORDER — LORAZEPAM 2 MG/ML IJ SOLN
2.0000 mg | Freq: Once | INTRAMUSCULAR | Status: AC
  Administered 2011-12-15: 2 mg via INTRAMUSCULAR
  Filled 2011-12-15: qty 1

## 2011-12-15 MED ORDER — ONDANSETRON HCL 4 MG PO TABS: 4.0000 mg | ORAL_TABLET | Freq: Three times a day (TID) | ORAL | Status: DC | PRN

## 2011-12-15 MED ORDER — NICOTINE 21 MG/24HR TD PT24: 21.0000 mg | MEDICATED_PATCH | Freq: Every day | TRANSDERMAL | Status: DC

## 2011-12-15 MED ORDER — ZIPRASIDONE MESYLATE 20 MG IM SOLR
20.0000 mg | Freq: Once | INTRAMUSCULAR | Status: AC
  Administered 2011-12-15: 20 mg via INTRAMUSCULAR
  Filled 2011-12-15: qty 20

## 2011-12-15 MED ORDER — IBUPROFEN 600 MG PO TABS: 600.0000 mg | ORAL_TABLET | Freq: Three times a day (TID) | ORAL | Status: DC | PRN

## 2011-12-15 MED ORDER — ALUM & MAG HYDROXIDE-SIMETH 200-200-20 MG/5ML PO SUSP: 30.0000 mL | ORAL | Status: DC | PRN

## 2011-12-15 MED ORDER — ACETAMINOPHEN 325 MG PO TABS: 650.0000 mg | ORAL_TABLET | ORAL | Status: DC | PRN

## 2011-12-15 MED ORDER — LORAZEPAM 1 MG PO TABS
1.0000 mg | ORAL_TABLET | Freq: Three times a day (TID) | ORAL | Status: DC | PRN
  Administered 2011-12-16: 1 mg via ORAL
  Filled 2011-12-15 (×2): qty 1
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
## 2011-12-15 NOTE — ED Notes (Addendum)
Gillis Ends, Consulting civil engineer at bedside assisting pt to change in to blue scrubs.
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
## 2011-12-15 NOTE — ED Provider Notes (Signed)
Medical screening examination/treatment/procedure(s) were conducted as a shared visit with non-physician practitioner(s) and myself.  I personally evaluated the patient during the encounter  Pt given geodon per her request and increased agitation--rechecked and resting comfortably  Toy Baker, MD 12/15/11 1437

## 2011-12-15 NOTE — BH Assessment (Signed)
Assessment Note   Rebekah Pace is an 56 y.o. female.   Pt was brought in by [employer] for disrobing at church and speaking incoherently at times.  Pt has not had meds for a period of time but time frame is unknown.  Pt has prior hx of Inptx.  Pt denies SA and HI and SI issues and does not appear to have hx of these behaviors recently.  Pt is paranoid, elated at times, agitated and received Geodon to de-escalate.  Pt has hx of nudity and exposing self.  Pt will receive a Tele Psych to determine if Inptx is needed or if pt can be managed on optx basis.  Axis I: Schizoaffective Disorder Axis II: Deferred Axis III:  Past Medical History  Diagnosis Date  . Thyroid disease   . Anxiety   . Mental disorder   . Depression   . Hepatitis    Axis IV: other psychosocial or environmental problems, problems related to social environment and problems with primary support group Axis V: 31-40 impairment in reality testing  Past Medical History:  Past Medical History  Diagnosis Date  . Thyroid disease   . Anxiety   . Mental disorder   . Depression   . Hepatitis     Past Surgical History  Procedure Date  . Arm fracture   . Rotator cuff repair     Family History: History reviewed. No pertinent family history.  Social History:  reports that she has never smoked. She has never used smokeless tobacco. She reports that she drinks alcohol. She reports that she does not use illicit drugs.  Additional Social History:  Alcohol / Drug Use Pain Medications: none Allergies:  Allergies  Allergen Reactions  . Sulfa Antibiotics Rash    Home Medications:  Medications Prior to Admission  Medication Dose Route Frequency Provider Last Rate Last Dose  . acetaminophen (TYLENOL) tablet 650 mg  650 mg Oral Q4H PRN Thomasene Lot, PA-C      . alum & mag hydroxide-simeth (MAALOX/MYLANTA) 200-200-20 MG/5ML suspension 30 mL  30 mL Oral PRN Thomasene Lot, PA-C      . ibuprofen (ADVIL,MOTRIN) tablet 600 mg  600 mg  Oral Q8H PRN Thomasene Lot, PA-C      . LORazepam (ATIVAN) injection 2 mg  2 mg Intramuscular Once Flint Melter, MD   2 mg at 12/15/11 1106  . LORazepam (ATIVAN) tablet 1 mg  1 mg Oral Q8H PRN Thomasene Lot, PA-C      . nicotine (NICODERM CQ - dosed in mg/24 hours) patch 21 mg  21 mg Transdermal Daily Thomasene Lot, PA-C      . ondansetron (ZOFRAN) tablet 4 mg  4 mg Oral Q8H PRN Thomasene Lot, PA-C      . ziprasidone (GEODON) injection 20 mg  20 mg Intramuscular Once Toy Baker, MD   20 mg at 12/15/11 1137  . zolpidem (AMBIEN) tablet 5 mg  5 mg Oral QHS PRN Thomasene Lot, PA-C       Medications Prior to Admission  Medication Sig Dispense Refill  . cyanocobalamin 100 MCG tablet Take 1 tablet (100 mcg total) by mouth daily. B12 supplement  30 tablet  0  . Emollient (FRUIT OF THE EARTH/ALOE VERA) LOTN Apply 1 application topically 2 (two) times daily in the am and at bedtime.. To support healing of burned feet.      Marland Kitchen levothyroxine (SYNTHROID, LEVOTHROID) 75 MCG tablet Take 1 tablet (75 mcg total) by mouth daily. Thyroid  supplement.  30 tablet  0  . traZODone (DESYREL) 100 MG tablet Take 2 tablets (200 mg total) by mouth at bedtime. For sleep.  60 tablet  0  . ziprasidone (GEODON) 80 MG capsule Take 1 capsule (80 mg total) by mouth 2 (two) times daily with a meal. For mood stability and clear thoughts.  60 capsule  0    OB/GYN Status:  No LMP recorded. Patient is postmenopausal.  General Assessment Data Location of Assessment: [facility] ACT Assessment: Yes Living Arrangements: Alone Can pt return to current living arrangement?: Yes Admission Status: Involuntary Is patient capable of signing voluntary admission?: No Transfer from: Acute Hospital Referral Source: MD  Education Status Is patient currently in school?: No  Risk to self Suicidal Ideation: No Suicidal Intent: No Is patient at risk for suicide?: No Suicidal Plan?: No Access to Means: No What has been your use  of drugs/alcohol within the last 12 months?: none Previous Attempts/Gestures: No How many times?: 0  Other Self Harm Risks: none Triggers for Past Attempts: None known Intentional Self Injurious Behavior: None Family Suicide History: No Recent stressful life event(s): Conflict (Comment) Persecutory voices/beliefs?: No Depression: No Depression Symptoms: Isolating Substance abuse history and/or treatment for substance abuse?: No Suicide prevention information given to non-admitted patients: Not applicable  Risk to Others Homicidal Ideation: No Thoughts of Harm to Others: No Current Homicidal Intent: No Current Homicidal Plan: No Access to Homicidal Means: No Identified Victim: 0 History of harm to others?: No Assessment of Violence: None Noted Violent Behavior Description: no Does patient have access to weapons?: No Criminal Charges Pending?: No Describe Pending Criminal Charges: none Does patient have a court date: No  Psychosis Hallucinations: None noted Delusions: Unspecified (pt speaks incoherently and disrobed in church)  Mental Status Report Appear/Hygiene: Disheveled Eye Contact: Poor Motor Activity: Hyperactivity Speech: Slurred;Slow Level of Consciousness: Drowsy Mood: Euphoric;Suspicious Affect: Blunted Anxiety Level: Moderate Thought Processes: Irrelevant;Tangential;Flight of Ideas Judgement: Impaired Orientation: Person Obsessive Compulsive Thoughts/Behaviors: Minimal  Cognitive Functioning Concentration: Decreased Memory: Recent Impaired;Remote Intact IQ: Average Insight: Poor Impulse Control: Poor Appetite: Fair Weight Loss: 0  Weight Gain: 0  Sleep: Decreased Total Hours of Sleep: 3  Vegetative Symptoms: None  Prior Inpatient Therapy Prior Inpatient Therapy: Yes Prior Therapy Dates: 8/12, 10/12, 11/12 Prior Therapy Facilty/Provider(s): [facility] Reason for Treatment: psychosis   Prior Outpatient Therapy Prior Outpatient Therapy: Yes Prior  Therapy Dates: ongoing  ADL Screening (condition at time of admission) Patient's cognitive ability adequate to safely complete daily activities?: Yes Patient able to express need for assistance with ADLs?: Yes Independently performs ADLs?: Yes Weakness of Legs: None Weakness of Arms/Hands: None  Home Assistive Devices/Equipment Home Assistive Devices/Equipment: None  Therapy Consults (therapy consults require a physician order) PT Evaluation Needed: No OT Evalulation Needed: No SLP Evaluation Needed: No Abuse/Neglect Assessment (Assessment to be complete while patient is alone) Physical Abuse: Denies Verbal Abuse: Denies Sexual Abuse: Denies Exploitation of patient/patient's resources: Denies Self-Neglect: Denies Values / Beliefs Cultural Requests During Hospitalization: None Spiritual Requests During Hospitalization: None Consults Spiritual Care Consult Needed: No Social Work Consult Needed: No Merchant navy officer (For Healthcare) Advance Directive: Patient does not have advance directive Pre-existing out of facility DNR order (yellow form or pink MOST form): No Nutrition Screen Diet: Regular Unintentional weight loss greater than 10lbs within the last month: No Dysphagia: No Home Tube Feeding or Total Parenteral Nutrition (TPN): No Patient appears severely malnourished: No Pregnant or Lactating: No Dietitian Consult Needed: No  Additional  Information 1:1 In Past 12 Months?: No CIRT Risk: No Elopement Risk: No Does patient have medical clearance?: Yes     Disposition: Pt will have a Tele Psych to determine dispo.  Recommend Inptx at [facility] at this time. Disposition Disposition of Patient: Inpatient treatment program (recommend Tele Psych; Inptx) Type of inpatient treatment program: Adult  On Site Evaluation by:   Reviewed with Physician:     Titus Mould, Eppie Gibson 12/15/2011 4:04 PM

## 2011-12-15 NOTE — ED Notes (Signed)
Report received-pt agitated/noncompliant with redirection, restless, yelling at times-[employer] present-Dr Freida Busman notified of pt's behavior

## 2011-12-15 NOTE — ED Notes (Signed)
PA at bedside.

## 2011-12-15 NOTE — ED Notes (Signed)
Security in to wand pt and pt personal belongings.

## 2011-12-15 NOTE — ED Notes (Signed)
Positive response from medication-resting quietly-no s/s's of distress

## 2011-12-16 ENCOUNTER — Encounter (HOSPITAL_COMMUNITY): Payer: Self-pay | Admitting: *Deleted

## 2011-12-16 ENCOUNTER — Inpatient Hospital Stay (HOSPITAL_COMMUNITY)
Admission: RE | Admit: 2011-12-16 | Discharge: 2011-12-23 | DRG: 885 | Disposition: A | Discharge status: Home / Self Care | Payer: Medicaid Other | Source: Ambulatory Visit | Attending: Psychiatry | Admitting: Psychiatry

## 2011-12-16 ENCOUNTER — Other Ambulatory Visit: Payer: Self-pay

## 2011-12-16 ENCOUNTER — Emergency Department (HOSPITAL_COMMUNITY): Payer: Medicaid Other

## 2011-12-16 DIAGNOSIS — B1920 Unspecified viral hepatitis C without hepatic coma: Secondary | ICD-10-CM

## 2011-12-16 DIAGNOSIS — Z79899 Other long term (current) drug therapy: Secondary | ICD-10-CM

## 2011-12-16 DIAGNOSIS — E039 Hypothyroidism, unspecified: Secondary | ICD-10-CM

## 2011-12-16 DIAGNOSIS — Z882 Allergy status to sulfonamides status: Secondary | ICD-10-CM

## 2011-12-16 DIAGNOSIS — F411 Generalized anxiety disorder: Secondary | ICD-10-CM

## 2011-12-16 DIAGNOSIS — F250 Schizoaffective disorder, bipolar type: Secondary | ICD-10-CM | POA: Diagnosis present

## 2011-12-16 DIAGNOSIS — F319 Bipolar disorder, unspecified: Secondary | ICD-10-CM

## 2011-12-16 DIAGNOSIS — F259 Schizoaffective disorder, unspecified: Principal | ICD-10-CM

## 2011-12-16 HISTORY — DX: Hypothyroidism, unspecified: E03.9

## 2011-12-16 MED ORDER — ALUM & MAG HYDROXIDE-SIMETH 200-200-20 MG/5ML PO SUSP
30.0000 mL | ORAL | Status: DC | PRN
  Administered 2011-12-16: 30 mL via ORAL

## 2011-12-16 MED ORDER — ACETAMINOPHEN 325 MG PO TABS
650.0000 mg | ORAL_TABLET | Freq: Four times a day (QID) | ORAL | Status: DC | PRN
  Administered 2011-12-18 – 2011-12-19 (×2): 650 mg via ORAL

## 2011-12-16 MED ORDER — TRAZODONE HCL 100 MG PO TABS
200.0000 mg | ORAL_TABLET | Freq: Every day | ORAL | Status: DC
  Administered 2011-12-16 – 2011-12-20 (×3): 200 mg via ORAL
  Filled 2011-12-16 (×6): qty 2

## 2011-12-16 MED ORDER — MAGNESIUM HYDROXIDE 400 MG/5ML PO SUSP: 30.0000 mL | Freq: Every day | ORAL | Status: DC | PRN

## 2011-12-16 MED ORDER — ZIPRASIDONE MESYLATE 20 MG IM SOLR
10.0000 mg | Freq: Once | INTRAMUSCULAR | Status: AC
  Administered 2011-12-16: 20 mg via INTRAMUSCULAR
  Filled 2011-12-16: qty 20

## 2011-12-16 NOTE — Tx Team (Signed)
Initial Interdisciplinary Treatment Plan  PATIENT STRENGTHS: (choose at least two) Communication skills General fund of knowledge  PATIENT STRESSORS: Health problems Legal issue Medication change or noncompliance   PROBLEM LIST: Problem List/Patient Goals Date to be addressed Date deferred Reason deferred Estimated date of resolution  anxiety       legal problems                                                 DISCHARGE CRITERIA:  Ability to meet basic life and health needs Improved stabilization in mood, thinking, and/or behavior Need for constant or close observation no longer present Verbal commitment to aftercare and medication compliance  PRELIMINARY DISCHARGE PLAN: Attend aftercare/continuing care group Participate in family therapy Return to previous living arrangement  PATIENT/FAMIILY INVOLVEMENT: This treatment plan has been presented to and reviewed with the patient, KEERA ALTIDOR, and/or family member, .  The patient and family have been given the opportunity to ask questions and make suggestions.  Hoover Browns 12/16/2011, 8:57 PM

## 2011-12-16 NOTE — ED Notes (Signed)
Pt. Taken and returned from CT/DG, pt. Calm, resting, breathing wnl.

## 2011-12-16 NOTE — ED Notes (Signed)
Per Mindi Junker, ACT pt. Accepted to [facility], rm 403-1 after 1600 today, cancel [employer].

## 2011-12-16 NOTE — ED Notes (Addendum)
Pt discharged in stable condition, transported by [facility] to [facility]. 2 bags belongings sent with pt.

## 2011-12-16 NOTE — Discharge Planning (Signed)
Patient has been accepted to [facility], Dr. Allena Katz. Patient's bed # is 403-1, however, bed won't be ready until after 4pm. Patient's nurse notified.  Rebekah Pace , MSW, LCSWA 12/16/2011 12:22 PM [phone]

## 2011-12-16 NOTE — Progress Notes (Signed)
Patient ID: Rebekah Pace, female   DOB: 1955/11/11, 56 y.o.   MRN: [id number] Pt admitted to [facility] involuntarily.  Pt states that she has been off of her medications for a few weeks.  She went to Agency last week but stated the employee was unable to understand her; she became frustrated and left.  Pt states that on yesterday she went to church and "made a fool of myself:".  Per report, pt began to disrobe while in church.  Pt denies and states that she was just talking loudly.  Pt states she has a charges pending (traffic ticket) but is unaware of the date.  Pt is cooperative but hyperverbal during the admission process.  Pt was unclothed when writer went to start admission, wrapped only in a blanket.  Pt stated that she was itching and was constantly picking at her skin.  Pt reports she currently lives alone and will be returning to her home upon discharge.  Fifteen minute checks in progress. Pt oriented to unit.

## 2011-12-16 NOTE — ED Provider Notes (Signed)
Psychiatric evaluation is ongoing. Disposition is currently pending. Patient has no current complaints and remains stable at this time.    [name] A. Patrica Duel, MD 12/16/11 1610

## 2011-12-16 NOTE — ED Notes (Signed)
Pt. Asleep, breathing wnl 

## 2011-12-16 NOTE — ED Notes (Signed)
Pt. Continues to remove clothing, x3 within last 30 min.  Asked to please return to room, pt. Will do so and come out again within 5 min.  Pt. To RN window constantly repeating questions, staff attempts to answer questions and provide assistance, pt. Not able to calm down.

## 2011-12-16 NOTE — ED Notes (Signed)
Gave 10mg  of Geodon, IM

## 2011-12-16 NOTE — Progress Notes (Signed)
IVC papers have been completed on patient. Patient's information has been faxed to [facility]. Waiting for CT, Chest Xray and EKG to send patient's information to [facility]. Waiting for IVC to be served to fax information to [facility] as they have beds as well.  Ileene Hutchinson , MSW, LCSWA 12/16/2011 10:35 AM [phone]

## 2011-12-16 NOTE — ED Notes (Signed)
Awaiting MD to put pt up for discharge. Second call made. [employer] on unit for transport.

## 2011-12-16 NOTE — ED Notes (Signed)
AC at [facility] states they are ready for pt to be transported over. [employer] called for transport. Pt aware. Report has already been called by day shift RN.

## 2011-12-16 NOTE — ED Notes (Signed)
Report called to [facility], RN at [facility].  Per [facility], they will contact us when [facility] bed available, sometime after 1900 today.

## 2011-12-17 DIAGNOSIS — F259 Schizoaffective disorder, unspecified: Principal | ICD-10-CM

## 2011-12-17 MED ORDER — POTASSIUM CHLORIDE CRYS ER 20 MEQ PO TBCR
20.0000 meq | EXTENDED_RELEASE_TABLET | Freq: Every day | ORAL | Status: DC
Start: 2011-12-17 — End: 2011-12-17
  Administered 2011-12-17: 20 meq via ORAL
  Filled 2011-12-17 (×2): qty 1

## 2011-12-17 MED ORDER — POTASSIUM CHLORIDE CRYS ER 20 MEQ PO TBCR
20.0000 meq | EXTENDED_RELEASE_TABLET | Freq: Every day | ORAL | Status: AC
  Administered 2011-12-18 – 2011-12-21 (×4): 20 meq via ORAL
  Filled 2011-12-17 (×4): qty 1

## 2011-12-17 MED ORDER — LUBRIDERM SERIOUSLY SENSITIVE EX LOTN
TOPICAL_LOTION | CUTANEOUS | Status: DC
  Administered 2011-12-17: 10:00:00 via TOPICAL
  Administered 2011-12-17: 1 via TOPICAL
  Administered 2011-12-18 – 2011-12-23 (×12): via TOPICAL
  Filled 2011-12-17: qty 473

## 2011-12-17 MED ORDER — ZIPRASIDONE HCL 80 MG PO CAPS
80.0000 mg | ORAL_CAPSULE | Freq: Two times a day (BID) | ORAL | Status: DC
  Administered 2011-12-17 – 2011-12-23 (×13): 80 mg via ORAL
  Filled 2011-12-17 (×15): qty 1

## 2011-12-17 MED ORDER — FRUIT OF THE EARTH/ALOE VERA EX LOTN: 1.0000 "application " | TOPICAL_LOTION | CUTANEOUS | Status: DC

## 2011-12-17 MED ORDER — MENTHOL 3 MG MT LOZG
1.0000 | LOZENGE | OROMUCOSAL | Status: DC | PRN
Start: 2011-12-17 — End: 2011-12-23
  Administered 2011-12-17 – 2011-12-20 (×6): 3 mg via ORAL

## 2011-12-17 MED ORDER — VITAMIN B-12 100 MCG PO TABS
100.0000 ug | ORAL_TABLET | Freq: Every day | ORAL | Status: DC
  Administered 2011-12-17 – 2011-12-23 (×7): 100 ug via ORAL
  Filled 2011-12-17 (×8): qty 1

## 2011-12-17 MED ORDER — ZIPRASIDONE HCL 40 MG PO CAPS: 40.0000 mg | ORAL_CAPSULE | Freq: Two times a day (BID) | ORAL | Status: DC

## 2011-12-17 MED ORDER — LEVOTHYROXINE SODIUM 75 MCG PO TABS
75.0000 ug | ORAL_TABLET | Freq: Every day | ORAL | Status: DC
  Administered 2011-12-17 – 2011-12-23 (×7): 75 ug via ORAL
  Filled 2011-12-17 (×8): qty 1

## 2011-12-17 MED ORDER — ENSURE IMMUNE HEALTH PO LIQD
237.0000 mL | Freq: Three times a day (TID) | ORAL | Status: DC
  Administered 2011-12-17 – 2011-12-19 (×6): 237 mL via ORAL
  Administered 2011-12-19: 17:00:00 via ORAL
  Administered 2011-12-20 (×3): 237 mL via ORAL
  Administered 2011-12-21: 06:00:00 via ORAL
  Administered 2011-12-21 – 2011-12-23 (×6): 237 mL via ORAL
  Administered 2011-12-23: 07:00:00 via ORAL

## 2011-12-17 NOTE — Tx Team (Signed)
Interdisciplinary Treatment Plan Update (Adult)  Date:  12/17/2011  Time Reviewed:  10:15AM-11:00AM  Progress in Treatment: Attending groups:  Yes Participating in groups:    Yes Taking medication as prescribed:    Yes Tolerating medication:   Yes Family/Significant other contact made:  No Patient understands diagnosis:   Yes Discussing patient identified problems/goals with staff:   Yes Medical problems stabilized or resolved:   Yes Denies suicidal/homicidal ideation:  Yes Issues/concerns per patient self-inventory:   None Other:    New problem(s) identified: No, Describe:    Reason for Continuation of Hospitalization: Anxiety Mania Medication stabilization  Interventions implemented related to continuation of hospitalization:  Medication monitoring and adjustment, safety checks Q15 min., suicide risk assessment, group therapy, psychoeducation, collateral contact, aftercare planning, ongoing physician assessments, medication education  Additional comments:  Not applicable  Estimated length of stay:  2-3 days  Discharge Plan:  Return home, follow up with ACTT  New goal(s):  Not applicable  Review of initial/current patient goals per problem list:   1.  Goal(s):  Reduce anxiety from admission level to no more than 3 at discharge.  Met:  No  Target date:  By Discharge   As evidenced by:  "9" today  2.  Goal(s):  Determine that patient is at baseline with psychotic behaviors.  Met:  Yes  Target date:  By Discharge   As evidenced by:  No psychotic behaviors noted per team history with patient  3.  Goal(s):  Reduce mania to baseline.  Met:  No  Target date:  By Discharge   As evidenced by:  Patient remains manic with flight of ideas, pressured speech  4.  Goal(s):  Refer to enhanced service.  Met:  No  Target date:  By Discharge   As evidenced by:  Patient agrees to referral to ACTT  Attendees: Patient:  Rebekah Pace  12/17/2011 10:15AM-11:00AM  Family:      Physician:  Dr. Harvie Heck Readling 12/17/2011 10:15AM-11:00AM  Nursing:   Neill Loft, RN 12/17/2011 10:15AM -11:00AM   Case Manager:  Ambrose Mantle, LCSW 12/17/2011 10:15AM-11:00AM  Counselor:  [name], MT-BC 12/17/2011 10:15AM-11:00AM  Other:   Lynann Bologna, NP 12/17/2011 10:15AM-11:00AM  Other:      Other:      Other:       Scribe for Treatment Team:   Sarina Ser, 12/17/2011, 10:15AM-11:15AM

## 2011-12-17 NOTE — Progress Notes (Signed)
Patient ID: Rebekah Pace, female   DOB: [birth year], 56 y.o.   MRN: [id number] (D) Pt. Awake, alert, NAD.  Appropriately groomed and dressed. Affect is bright.   (A) Discussed nursing plan of care.  (R) Pt c/o non productive cough x 1 week with worsening symptoms.  Afebrile.  Denies h/o pneumonia, recurrent lower respiratory infections, asthma.  Reports intermittent smoking, last cigarette about two weeks ago.  D/W dr. Allena Katz who ordered Cepacol Lozenge PRN.

## 2011-12-17 NOTE — [facility] Suicide Risk Assessment (Signed)
Suicide Risk Assessment  Admission Assessment     Demographic factors:  Assessment Details Time of Assessment: Admission Information Obtained From: Patient Current Mental Status:  AO x 3. Loss Factors:  Loss Factors: Decline in physical health;Legal issues Historical Factors:  Historical Factors: Family history of mental illness or substance abuse Risk Reduction Factors:  Risk Reduction Factors: Religious beliefs about death  CLINICAL FACTORS:  Severe Anxiety and/or Agitation Currently Psychotic Previous Psychiatric Diagnoses and Treatments Medical Diagnoses and Treatments/Surgeries  COGNITIVE FEATURES THAT CONTRIBUTE TO RISK:  Loss of executive function    COGNITIVE FEATURES THAT CONTRIBUTE TO RISK:  Polarized thinking   Diagnosis:  Axis I: Schizoaffective Disorder - Bipolar Type.   The patient was seen today and reports the following:   Sleep: The patient reports to having significant difficulty initiating and maintaining sleep. Appetite: The patient reports a good appetite.   Mild>(1-10) >Severe  Hopelessness (1-10): 0  Depression (1-10): 0  Anxiety (1-10): 9.   Suicidal Ideation: The patient adamantly denies any suicidal ideations today.  Plan: No  Intent: No  Means: No   Homicidal Ideation: The patient adamantly denies any homicidal ideations today.  Plan: No  Intent: No.  Means: No   Eye Contact: Good.  General Appearance/Behavior: Somewhat disshelved with significant manic behavior.  Motor Behavior: Moderately agitated.  Speech: Moderately increased in rate and volume.  Mental Status: Alert and Oriented x 3  Level of Consciousness: Alert  Mood: Moderately Manic.  Affect: Moderately Expansive.  Anxiety: Severe Anxiety Reported.  Thought Process: Coherent  Thought Content: The patient denies any auditory or visual hallucinations.  She does report ongoing issues with her skin which may be delusional in nature. Perception: Possible delusional.  Judgment:  Fair to Poor.  Insight: Fair to poor.  Cognition: Orientated to time, place and person.   Time was spent today discussing with the patient the situation leading to her admission. The patient states that she was having difficulty obtaining her medications from [facility] after discharge and has been off of her medication since her discharge from the hospital.  It was discussed with the patient the possibility of looking into an ACT team at discharge to help with medications and she agreed.  Treatment Plan Summary:  1. Daily contact with patient to assess and evaluate symptoms and progress in treatment  2. Medication management  3. The patient will deny suicidal ideations or homicidal ideations for 48 hours prior to discharge and have a depression and anxiety rating of 3 or less. The patient will also deny any auditory or visual hallucinations or delusional thinking or display any manic or hypomanic behaviors.   Plan:  1. Will restart Geodon at 80 mgs po BID with meals as a mood stabilizer.  2. Will start KCL 20 mEQ po q am x 5 doses for low potassium.  3. Will continue Synthroid 75 mcg po q am for hypothyroidism. 4. Will continue Trazodone 200 mgs po qhs for sleep. 5. Will continue B12 1000 mcg po q am for low B12. 6. Will continue Lubriderm lotion to apply to arms and legs at 8 am, 2 pm and hs for dry skin. 7. Laboratory studies reviewed. 8. Will continue to monitor.   SUICIDE RISK:  Mild:  Suicidal ideation of limited frequency, intensity, duration, and specificity.  There are no identifiable plans, no associated intent, mild dysphoria and related symptoms, good self-control (both objective and subjective assessment), few other risk factors, and identifiable protective factors, including available and accessible  social support.  Wenzel Backlund 12/17/2011, 10:12 AM

## 2011-12-17 NOTE — Progress Notes (Signed)
Patient ID: Rebekah Pace, female   DOB: 05/20/1956, 56 y.o.   MRN: [id number] Pt says she slept well and her appetite is poor. She showered and washed her hair this am.  She reports her ability to pay attention is improving.  She attended treatment team and was expansive and effusive about her willingness to have an ACT team that could support her in her recovery, especially her medication management.  Pt has been appreciative of care given.  She describes herself as "hyper".

## 2011-12-17 NOTE — ED Provider Notes (Signed)
Medical screening examination/treatment/procedure(s) were conducted as a shared visit with non-physician practitioner(s) and myself.  I personally evaluated the patient during the encounter  Toy Baker, MD 12/17/11 [phone]

## 2011-12-17 NOTE — Discharge Planning (Signed)
Met with patient in Aftercare Planning Group.   Is manic, but pleasantly so.  Patient will return to her apartment and states that there is a friend currently staying with her, but that friend will have to leave before long.  She had a hard time physically opening some of her medications last time, and asked if we can send her samples out opened.  We replied that this is not possible.  Patient admitted to having a hard time staying on her medications, and CM described for her what an Doctor, hospital could do for her.  She was excited and agreeable.  CM contacted [facility] to initiate referral if they could start with patient immediately at discharge.  Awaiting call back.  Ambrose Mantle, LCSW 12/17/2011, 4:01 PM

## 2011-12-17 NOTE — Progress Notes (Signed)
[facility] Group Notes:  (Counselor/Nursing/MHT/Case Management/Adjunct)  12/17/2011 4:11 PM  Type of Therapy:  Group Therapy  Participation Level:  Active  Participation Quality:  Attentive, Monopolizing and Sharing  Affect:  Appropriate  Cognitive:  Oriented  Insight:  Limited  Engagement in Group:  Good  Engagement in Therapy:  Good  Modes of Intervention:  Problem-solving and Support  Summary of Progress/Problems: Patient talked about experience at [facility] where she was frustrated because they didn't know her medications and when she argued with them about this they didn't give her the medications and wouldn't see her the next day. Talked about cutting her hair. Hygiene and physical health much improved since last admission.   Peola Joynt, Aram Beecham 12/17/2011, 4:11 PM

## 2011-12-17 NOTE — Progress Notes (Signed)
Adult Psychosocial Assessment Update Interdisciplinary Team  Previous [facility] admissions/discharges:  Admissions Discharges  Date:11/14/11 Date:  Date:09/12/11 Date:  Date:06/04/11 Date:  Date: Date:  Date: Date:   Changes since the last Psychosocial Assessment (including adherence to outpatient mental health and/or substance abuse treatment, situational issues contributing to decompensation and/or relapse). Stopped taking her medications 2 weeks ago  It was reported that she disrobed in church. Patient denies this but stated "she made a fool of herself".           Discharge Plan 1. Will you be returning to the same living situation after discharge?   Yes:x No:      If no, what is your plan?    Lives alone. Has someone (friend) staying with her to help clean her house.       2. Would you like a referral for services when you are discharged? Yes:     If yes, for what services?  No:       Patient is not happy with [facility] and says they are the problem when they didn't know her medications and wouldn't work with her. In treatment team patient agreed to an ACTT.       Summary and Recommendations (to be completed by the evaluator) Patient is a 56 year old female with diagnosis of schizoaffective disorder, bipolar type.   Patient has been off her medications for two weeks and began to exhibit manic behaviors. During assessment patient was hyper-verbal. Recommendations are for patient to get back on medications to decrease manic behaviors, medication education for patient to see the need for medications,and outpatient referral for ACTT.                     Signature:  [name], 12/17/2011 9:20 AM
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
## 2011-12-18 LAB — CBC
Hemoglobin: 11.9 g/dL — ABNORMAL LOW (ref 12.0–15.0)
MCH: 29.2 pg (ref 26.0–34.0)
RBC: 4.08 MIL/uL (ref 3.87–5.11)

## 2011-12-18 LAB — COMPREHENSIVE METABOLIC PANEL
Alkaline Phosphatase: 73 U/L (ref 39–117)
BUN: 7 mg/dL (ref 6–23)
CO2: 28 mEq/L (ref 19–32)
Chloride: 102 mEq/L (ref 96–112)
GFR calc Af Amer: >90 mL/min (ref >90)
GFR calc non Af Amer: >90 mL/min (ref >90)
Glucose, Bld: 103 mg/dL — ABNORMAL HIGH (ref 70–99)
Potassium: 3.8 mEq/L (ref 3.5–5.1)
Total Bilirubin: 0.2 mg/dL — ABNORMAL LOW (ref 0.3–1.2)
Total Protein: 6.6 g/dL (ref 6.0–8.3)

## 2011-12-18 LAB — DIFFERENTIAL
Eosinophils Absolute: 0.3 10*3/uL (ref 0.0–0.7)
Lymphs Abs: 2.7 10*3/uL (ref 0.7–4.0)
Monocytes Relative: 9 % (ref 3–12)
Neutrophils Relative %: 41 % — ABNORMAL LOW (ref 43–77)

## 2011-12-18 MED ORDER — LORAZEPAM 1 MG PO TABS
2.0000 mg | ORAL_TABLET | Freq: Once | ORAL | Status: AC
  Administered 2011-12-18: 2 mg via ORAL

## 2011-12-18 MED ORDER — LORAZEPAM 1 MG PO TABS
1.0000 mg | ORAL_TABLET | Freq: Four times a day (QID) | ORAL | Status: AC | PRN
  Administered 2011-12-19 (×2): 1 mg via ORAL
  Filled 2011-12-18 (×2): qty 1

## 2011-12-18 MED ORDER — LORAZEPAM 1 MG PO TABS
ORAL_TABLET | ORAL | Status: AC
  Administered 2011-12-18: 2 mg via ORAL
  Filled 2011-12-18: qty 2

## 2011-12-18 NOTE — Progress Notes (Signed)
12/18/2011  Time: 0930   Group Topic/Focus: The focus of this group is on discussing various aspects of wellness, balancing those aspects and exploring ways to increase the ability to experience wellness.   Participation Level:  Minimal  Participation Quality:  Redirectable  Affect:  Labile  Cognitive:  Alert   Additional Comments: Patient in and out of group, bright while in group, crying in the hallway saying she wants to go home.   Christella App  12/18/2011 1:07 PM

## 2011-12-18 NOTE — Progress Notes (Signed)
Patient ID: Rebekah Pace, female   DOB: 10-30-1955, 56 y.o.   MRN: [id number] Pt is awake and active on the unit this PM. Pt denies SI/HI and AVH. Pt is cooperative with staff but is also very demanding and intrusive. Pt mood is labile but she is redirectable. Pt went downstairs to the gymnasium for recreational therapy and stated that she had a good time playing referee for the basketball game. She states that it was better to find a way to participate rather than isolate and feel miserable. Writer will continue to monitor.

## 2011-12-18 NOTE — Progress Notes (Signed)
Pt resting in bed with eyes closed.  No distress observed.  Safety maintained with q15 minute checks. 

## 2011-12-18 NOTE — Discharge Planning (Signed)
Met with patient in Aftercare Planning Group.   She was pleasant, hypomanic, still focused on her hair and liver.  She insisted several times that she is ready for discharge today.  Case Manager told her that we would like [facility] to come visit with her prior to discharge so that those services can start immediately upon discharge.  She seemed to accept this without problem.  She said again that she wants ACTT services, and did not want further explanation of what is entailed.  During Aftercare Planning Group, Case Manager provided education about support groups, what they are and why they are beneficial. Discussion was held with group about different kinds of support groups available and how they can assist with patient's recovery plans.   Also during Aftercare Planning Group, Case Manager provided psychoeducation on "Suicide Prevention Information." This included descriptions of risk factors for suicide, warning signs that an individual is in crisis and thinking of suicide, and what to do if this occurs. Pt indicated understanding of information provided, and will read brochure given upon discharge. Patients were all very interactive in this discussion.  Case Manager completed referral form and sent, along with supporting documentation, to [facility] along with a fax cover requesting a visit with patient in the hospital as soon as possible so that services could start immediately upon discharge.  Awaiting word back.  Ambrose Mantle, LCSW 12/18/2011, 10:15 AM

## 2011-12-18 NOTE — Progress Notes (Signed)
Patient ID: Rebekah Pace, female   DOB: 12/04/1955, 56 y.o.   MRN: [id number] Cooperative and directable today with labile affect, mildly pressured speech and motor restlessness. She is dancing around the consult room and has been lifting her shirt up and showing her underwear around the other patients.  She asks to be discharged today so she can give her son a ride to the community college.  She dissolves into tears when I remind her that we have a lot of work to do with her and she's not yet healthy enough to leave, but she is accepting of this.   Denies suicidal thoughts and depression.  Says her church is coming out to help her clean up her house and get things organized and she is pleased that the Case Manager is helping her with an ACT team to help with meds after she goes home.   Tells me she has a 'hell of a lot' of anxiety and she is 'hyper as hell' and asks if I can please give her something 'to relax me'.    Plan: Ativan 2mg  PO x 1 now.  Have reviewed these findings and the plan with Dr. Allena Katz and he is in agreement.

## 2011-12-18 NOTE — Progress Notes (Signed)
[facility] Group Notes:  (Counselor/Nursing/MHT/Case Management/Adjunct)  12/18/2011 2:02 PM  Type of Therapy:  Group Therapy  Participation Level:  None  Participation Quality:  Drowsy  Affect:  Blunted  Cognitive:  Oriented  Insight:  None  Engagement in Group:  None  Engagement in Therapy:  None  Modes of Intervention:  Limit-setting  Summary of Progress/Problems: Patient started group by saying that she was drowsy from medications and could she go to bed. Encouraged her to stay but after a few minutes she left group.  Rebekah Pace, Aram Beecham 12/18/2011, 2:02 PM

## 2011-12-18 NOTE — Progress Notes (Signed)
Patient is complaining of anxiety and wants to go home.  Concerned over 57 year old son getting a ride to school.  Manic and argumentative at times.  Complained of sore throat.  Ativan 2mg  given for anxiety, and cepacol given for sore throat.  Offered support and encouragement.  Safety maintained on unit.

## 2011-12-19 NOTE — Progress Notes (Signed)
Pt is needy and intrusive. She became argumentative with another pt on unit and required redirection. Pt was able to become calm and focus on Clinical research associate. Pt requested Cepacol for her throat and tylenol for general ache. Pt's temp was 98.6 and pt insisted that she had a fever. Attempted to explain that temp was WNL and pt required redirection numberous times. She denies si and hi. Safety maintained on unit.

## 2011-12-19 NOTE — Progress Notes (Signed)
Pt. Denies SI/HI and denies A/V hallucinations.  Pt. Interacting appropriately with her peers.  No issues or concerns voiced.

## 2011-12-19 NOTE — Progress Notes (Signed)
[facility] MD Progress Note  12/19/2011 2:50 PM  Diagnosis:  Axis I: Schizoaffective Disorder - Bipolar Type.   The patient was seen today and reports the following:   Sleep: The patient reports to sleeping well but according to staff she slept just 5 hours last night.  Appetite: The patient reports a decreased appetite.   Mild>(1-10) >Severe  Hopelessness (1-10): 10  Depression (1-10): 0  Anxiety (1-10): 0.   Suicidal Ideation: The patient adamantly denies any suicidal ideations today.  Plan: No  Intent: No  Means: No   Homicidal Ideation: The patient adamantly denies any homicidal ideations today.  Plan: No  Intent: No.  Means: No   Eye Contact: Good.  General Appearance/Behavior: The patient remains somewhat disshelved with ongoing manic behavior.  Motor Behavior: Moderately agitated.  Speech: Moderately increased in rate and volume with much pressuring.  Mental Status: Alert and Oriented x 3  Level of Consciousness: Alert  Mood: Moderately Manic.  Affect: Moderately Expansive.  Anxiety: No anxiety reported today.  Thought Process: Coherent  Thought Content: The patient denies any auditory or visual hallucinations or delusional thinking.  She does report ongoing flight of ideas.  Perception: impaired.  Judgment: Fair to poor.  Insight: Fair to poor.  Cognition: Orientated to time, place and person.  Sleep:  Number of Hours: 5    Vital Signs:Blood pressure 122/78, pulse 100, temperature 98.6 F (37 C), temperature source Oral, resp. rate 16, height 5' 2.5" (1.588 m), weight 64.411 kg (142 lb).  Current Medications: Current Facility-Administered Medications  Medication Dose Route Frequency Provider Last Rate Last Dose  . acetaminophen (TYLENOL) tablet 650 mg  650 mg Oral Q6H PRN Sanjuana Kava, NP   650 mg at 12/19/11 4540  . alum & mag hydroxide-simeth (MAALOX/MYLANTA) 200-200-20 MG/5ML suspension 30 mL  30 mL Oral Q4H PRN Sanjuana Kava, NP   30 mL at 12/16/11 2310  .  feeding supplement (ENSURE IMMUNE HEALTH) liquid 237 mL  237 mL Oral TID WC Marcella Dunnaway, MD   237 mL at 12/19/11 1027  . levothyroxine (SYNTHROID, LEVOTHROID) tablet 75 mcg  75 mcg Oral Daily Franchot Gallo, MD   75 mcg at 12/19/11 0756  . LORazepam (ATIVAN) tablet 1 mg  1 mg Oral Q6H PRN Viviann Spare, NP   1 mg at 12/19/11 1214  . lubriderm seriously sensitive lotion   Topical [id number] Mar Zettler, MD      . magnesium hydroxide (MILK OF MAGNESIA) suspension 30 mL  30 mL Oral Daily PRN Sanjuana Kava, NP      . menthol-cetylpyridinium (CEPACOL) lozenge 3 mg  1 lozenge Oral PRN Franchot Gallo, MD   3 mg at 12/19/11 0831  . potassium chloride SA (K-DUR,KLOR-CON) CR tablet 20 mEq  20 mEq Oral Q breakfast Franchot Gallo, MD   20 mEq at 12/19/11 0756  . traZODone (DESYREL) tablet 200 mg  200 mg Oral QHS Franchot Gallo, MD   200 mg at 12/16/11 2242  . vitamin B-12 (CYANOCOBALAMIN) tablet 100 mcg  100 mcg Oral Daily Franchot Gallo, MD   100 mcg at 12/19/11 0755  . ziprasidone (GEODON) capsule 80 mg  80 mg Oral BID WC Franchot Gallo, MD   80 mg at 12/19/11 9811   Lab Results:  Results for orders placed during the hospital encounter of 12/16/11 (from the past 48 hour(s))  CBC     Status: Abnormal   Collection Time   12/18/11  8:13 PM  Component Value Range Comment   WBC 6.2  4.0 - 10.5 (K/uL)    RBC 4.08  3.87 - 5.11 (MIL/uL)    Hemoglobin 11.9 (*) 12.0 - 15.0 (g/dL)    HCT 40.9 (*) 81.1 - 46.0 (%)    MCV 87.5  78.0 - 100.0 (fL)    MCH 29.2  26.0 - 34.0 (pg)    MCHC 33.3  30.0 - 36.0 (g/dL)    RDW 91.4  78.2 - 95.6 (%)    Platelets 236  150 - 400 (K/uL)   DIFFERENTIAL     Status: Abnormal   Collection Time   12/18/11  8:13 PM      Component Value Range Comment   Neutrophils Relative 41 (*) 43 - 77 (%)    Neutro Abs 2.5  1.7 - 7.7 (K/uL)    Lymphocytes Relative 44  12 - 46 (%)    Lymphs Abs 2.7  0.7 - 4.0 (K/uL)    Monocytes Relative 9  3 - 12 (%)    Monocytes Absolute 0.6  0.1 -  1.0 (K/uL)    Eosinophils Relative 5  0 - 5 (%)    Eosinophils Absolute 0.3  0.0 - 0.7 (K/uL)    Basophils Relative 1  0 - 1 (%)    Basophils Absolute 0.0  0.0 - 0.1 (K/uL)   COMPREHENSIVE METABOLIC PANEL     Status: Abnormal   Collection Time   12/18/11  8:13 PM      Component Value Range Comment   Sodium 138  135 - 145 (mEq/L)    Potassium 3.8  3.5 - 5.1 (mEq/L)    Chloride 102  96 - 112 (mEq/L)    CO2 28  19 - 32 (mEq/L)    Glucose, Bld 103 (*) 70 - 99 (mg/dL)    BUN 7  6 - 23 (mg/dL)    Creatinine, Ser 2.13  0.50 - 1.10 (mg/dL)    Calcium 9.4  8.4 - 10.5 (mg/dL)    Total Protein 6.6  6.0 - 8.3 (g/dL)    Albumin 3.5  3.5 - 5.2 (g/dL)    AST 29  0 - 37 (U/L)    ALT 24  0 - 35 (U/L)    Alkaline Phosphatase 73  39 - 117 (U/L)    Total Bilirubin 0.2 (*) 0.3 - 1.2 (mg/dL)    GFR calc non Af Amer >90  >90 (mL/min)    GFR calc Af Amer >90  >90 (mL/min)    Time was spent today discussing with the patient her ongoing symptoms.  The patient reports that she feels she can go home but continues to display significant manic behavior with flight of ideas and pressured speech. The patient has difficulty expressing her thoughts and continues not to display good judgement. She requires ongoing hospitalization.   Treatment Plan Summary:  1. Daily contact with patient to assess and evaluate symptoms and progress in treatment  2. Medication management  3. The patient will deny suicidal ideations or homicidal ideations for 48 hours prior to discharge and have a depression and anxiety rating of 3 or less. The patient will also deny any auditory or visual hallucinations or delusional thinking or display any manic or hypomanic behaviors.   Plan:  1. Will continue current medications. 2. Laboratory studies reviewed. 3. Will continue to monitor.   Jayel Scaduto 12/19/2011, 2:50 PM
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
## 2011-12-19 NOTE — Progress Notes (Signed)
Pt resting in bed with eyes closed.  No distress observed.  Safety maintained with q15 minute checks. 
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
## 2011-12-20 NOTE — Progress Notes (Signed)
Pt has been up and visible in milieu today, has been actively participating in various activities, did endorse having feelings of anxiety, pt has received medications today without incident, support provided, will continue to monitor

## 2011-12-20 NOTE — Progress Notes (Signed)
Pt has been intrusive and argumentative with another pt on shift. Pt is impulsive and demanding and mood changes to apologetic within minutes. She denies voices, si or hi. Safety maintained on unit.

## 2011-12-20 NOTE — Progress Notes (Signed)
[facility] Group Notes:  (Counselor/Nursing/MHT/Case Management/Adjunct)  12/20/2011 2:00 PM  Type of Therapy:  Group Therapy  Participation Level:  Active  Participation Quality:  Attentive and Sharing  Affect:  Appropriate  Cognitive:  Oriented  Insight:  Limited  Engagement in Group:  Good  Engagement in Therapy:  Good  Modes of Intervention:  Education, Problem-solving and Support  Summary of Progress/Problems: Patient was much calmer today and less irritable. Behavior has slowed down. Talked about the difference between having energy and not being able to stop. Continues to insist that she is ready to go home and she has the support of her church when she leaves.   Rebekah Pace, Aram Beecham 12/20/2011, 2:00 PM

## 2011-12-20 NOTE — Tx Team (Signed)
Interdisciplinary Treatment Plan Update (Adult)  Date:  12/20/2011  Time Reviewed:  10:15AM-11:00AM  Progress in Treatment: Attending groups:  Yes Participating in groups:    Yes Taking medication as prescribed:    Yes Tolerating medication:   Yes Family/Significant other contact made:  No Patient understands diagnosis:   Yes Discussing patient identified problems/goals with staff:   Yes Medical problems stabilized or resolved:   Yes Denies suicidal/homicidal ideation:  Yes Issues/concerns per patient self-inventory:   None Other:    New problem(s) identified: No, Describe:    Reason for Continuation of Hospitalization: Mania Medication stabilization  Interventions implemented related to continuation of hospitalization:  Medication monitoring and adjustment, safety checks Q15 min., suicide risk assessment, group therapy, psychoeducation, collateral contact, aftercare planning, ongoing physician assessments, medication education  Additional comments:  Not applicable  Estimated length of stay:  2 days  Discharge Plan:  Will live alone and follow up with [facility].  New goal(s):  Not applicable  Review of initial/current patient goals per problem list:   1.  Goal(s):  Reduce anxiety from admission level to no more than 3 at discharge.  Met:  No  Target date:  By Discharge   As evidenced by:  Although states is not anxious today, sometimes symptoms display anxiety  2.  Goal(s):  Met previously    3.  Goal(s):  Reduce mania to baseline.  Met:  No  Target date:  By Discharge   As evidenced by:  Still has mood lability, flight of ideas, pressured speech  4.  Goal(s):  Refer to enhanced service.  Met:  Yes  Target date:  By Discharge   As evidenced by:  Patient will have Comprehensive Clinical Assessment by [facility] at 1pm today.  Attendees: Patient:  Rebekah Pace  12/20/2011 10:15AM-11:00AM  Family:     Physician:  Dr. Harvie Heck Readling 12/20/2011 10:15AM-11:00AM    Nursing:   Edwyna Shell, RN 12/20/2011 10:15AM -11:00AM   Case Manager:  Ambrose Mantle, LCSW 12/20/2011 10:15AM-11:00AM  Counselor:  [name], MT-BC 12/20/2011 10:15AM-11:00AM  Other:   Lynann Bologna, NP 12/20/2011 10:15AM-11:00AM  Other:      Other:      Other:       Scribe for Treatment Team:   Sarina Ser, 12/20/2011, 10:15AM-11:15AM

## 2011-12-20 NOTE — Discharge Planning (Signed)
Met with patient in Aftercare Planning Group.   No case management needs today, although patient was focused on leaving hospital.  Patient met with [facility] today for a Comprehensive Clinical Assessment.  Case Manager did get her signature on a release from [facility] in order to get their recent CCA, which she said took 3 hours.  They ended up calling and saying that her recent CCA was done at [facility], not [facility].  Did utiliazation review for more days.  Will call [facility] on Monday to see results.  Ambrose Mantle, LCSW 12/20/2011, 4:44 PM

## 2011-12-20 NOTE — Progress Notes (Signed)
Pt is in bed resting with eyes closed   No distress noted   Q 15 min checks  Pt safe at present 

## 2011-12-20 NOTE — Progress Notes (Signed)
[facility] MD Progress Note  12/20/2011 4:14 PM  Diagnosis:  Axis I: Schizoaffective Disorder - Bipolar Type.   The patient was seen today and reports the following:   Sleep: The patient reports to sleeping well but according to staff she slept just 3.25 hours last night.  Appetite: The patient reports a good appetite.   Mild>(1-10) >Severe  Hopelessness (1-10): 0  Depression (1-10): 0  Anxiety (1-10): 0.   Suicidal Ideation: The patient adamantly denies any suicidal ideations today.  Plan: No  Intent: No  Means: No   Homicidal Ideation: The patient adamantly denies any homicidal ideations today.  Plan: No  Intent: No.  Means: No   Eye Contact: Good.  General Appearance/Behavior: The patient remains somewhat disshelved with ongoing manic behavior.  Motor Behavior: Mild to moderately agitated.  Speech: Moderately increased in rate and volume with moderate pressuring.  Mental Status: Alert and Oriented x 3  Level of Consciousness: Alert  Mood: Moderately Manic.  Affect: Moderately Expansive.  Anxiety: No anxiety reported today.  Thought Process: Coherent  Thought Content: The patient denies any auditory or visual hallucinations or delusional thinking. She does display ongoing flight of ideas.  Perception: impaired.  Judgment: Fair to poor.  Insight: Fair to poor.  Cognition: Orientated to time, place and person.  Sleep:  Number of Hours: 3.25    Vital Signs:Blood pressure 107/66, pulse 101, temperature 98.1 F (36.7 C), temperature source Oral, resp. rate 16, height 5' 2.5" (1.588 m), weight 64.411 kg (142 lb).  Current Medications: Current Facility-Administered Medications  Medication Dose Route Frequency Provider Last Rate Last Dose  . acetaminophen (TYLENOL) tablet 650 mg  650 mg Oral Q6H PRN Sanjuana Kava, NP   650 mg at 12/19/11 1610  . alum & mag hydroxide-simeth (MAALOX/MYLANTA) 200-200-20 MG/5ML suspension 30 mL  30 mL Oral Q4H PRN Sanjuana Kava, NP   30 mL at 12/16/11  2310  . feeding supplement (ENSURE IMMUNE HEALTH) liquid 237 mL  237 mL Oral TID WC Curlene Labrum Toy Samarin, MD   237 mL at 12/20/11 1506  . levothyroxine (SYNTHROID, LEVOTHROID) tablet 75 mcg  75 mcg Oral Daily Curlene Labrum Mikella Linsley, MD   75 mcg at 12/20/11 0803  . LORazepam (ATIVAN) tablet 1 mg  1 mg Oral Q6H PRN Viviann Spare, NP   1 mg at 12/19/11 1214  . lubriderm seriously sensitive lotion   Topical [id number] Bryton Waight D Lyza Houseworth, MD      . magnesium hydroxide (MILK OF MAGNESIA) suspension 30 mL  30 mL Oral Daily PRN Sanjuana Kava, NP      . menthol-cetylpyridinium (CEPACOL) lozenge 3 mg  1 lozenge Oral PRN Ronny Bacon, MD   3 mg at 12/20/11 1143  . potassium chloride SA (K-DUR,KLOR-CON) CR tablet 20 mEq  20 mEq Oral Q breakfast Curlene Labrum Shakina Choy, MD   20 mEq at 12/20/11 0804  . traZODone (DESYREL) tablet 200 mg  200 mg Oral QHS Curlene Labrum Abigael Mogle, MD   200 mg at 12/20/11 0203  . vitamin B-12 (CYANOCOBALAMIN) tablet 100 mcg  100 mcg Oral Daily Curlene Labrum Aubrie Lucien, MD   100 mcg at 12/20/11 0804  . ziprasidone (GEODON) capsule 80 mg  80 mg Oral BID WC Curlene Labrum Dastan Krider, MD   80 mg at 12/20/11 9604   Lab Results:  Results for orders placed during the hospital encounter of 12/16/11 (from the past 48 hour(s))  CBC     Status: Abnormal   Collection Time  12/18/11  8:13 PM      Component Value Range Comment   WBC 6.2  4.0 - 10.5 (K/uL)    RBC 4.08  3.87 - 5.11 (MIL/uL)    Hemoglobin 11.9 (*) 12.0 - 15.0 (g/dL)    HCT 16.1 (*) 09.6 - 46.0 (%)    MCV 87.5  78.0 - 100.0 (fL)    MCH 29.2  26.0 - 34.0 (pg)    MCHC 33.3  30.0 - 36.0 (g/dL)    RDW 04.5  40.9 - 81.1 (%)    Platelets 236  150 - 400 (K/uL)   DIFFERENTIAL     Status: Abnormal   Collection Time   12/18/11  8:13 PM      Component Value Range Comment   Neutrophils Relative 41 (*) 43 - 77 (%)    Neutro Abs 2.5  1.7 - 7.7 (K/uL)    Lymphocytes Relative 44  12 - 46 (%)    Lymphs Abs 2.7  0.7 - 4.0 (K/uL)    Monocytes Relative 9  3 - 12 (%)     Monocytes Absolute 0.6  0.1 - 1.0 (K/uL)    Eosinophils Relative 5  0 - 5 (%)    Eosinophils Absolute 0.3  0.0 - 0.7 (K/uL)    Basophils Relative 1  0 - 1 (%)    Basophils Absolute 0.0  0.0 - 0.1 (K/uL)   COMPREHENSIVE METABOLIC PANEL     Status: Abnormal   Collection Time   12/18/11  8:13 PM      Component Value Range Comment   Sodium 138  135 - 145 (mEq/L)    Potassium 3.8  3.5 - 5.1 (mEq/L)    Chloride 102  96 - 112 (mEq/L)    CO2 28  19 - 32 (mEq/L)    Glucose, Bld 103 (*) 70 - 99 (mg/dL)    BUN 7  6 - 23 (mg/dL)    Creatinine, Ser 9.14  0.50 - 1.10 (mg/dL)    Calcium 9.4  8.4 - 10.5 (mg/dL)    Total Protein 6.6  6.0 - 8.3 (g/dL)    Albumin 3.5  3.5 - 5.2 (g/dL)    AST 29  0 - 37 (U/L)    ALT 24  0 - 35 (U/L)    Alkaline Phosphatase 73  39 - 117 (U/L)    Total Bilirubin 0.2 (*) 0.3 - 1.2 (mg/dL)    GFR calc non Af Amer >90  >90 (mL/min)    GFR calc Af Amer >90  >90 (mL/min)    Time was spent today discussing with the patient her ongoing symptoms. The patient continues to display mild to moderate pressuring of her speech as well as mild to moderate flight of ideas. The severity of her symptoms continue to slowly improve.  The patient may benefit from the addition of another mood stabilizer if her symptoms continue under inadequate control.  Treatment Plan Summary:  1. Daily contact with patient to assess and evaluate symptoms and progress in treatment  2. Medication management  3. The patient will deny suicidal ideations or homicidal ideations for 48 hours prior to discharge and have a depression and anxiety rating of 3 or less. The patient will also deny any auditory or visual hallucinations or delusional thinking or display any manic or hypomanic behaviors.   Plan:  1. Will continue current medications.  2. Will continue to monitor.  3. The patient is refusing to take any additional medications but this may  be necessary in the future if her mood continue under inadequate  control.  Rebekah Pace 12/20/2011, 4:14 PM

## 2011-12-21 MED ORDER — SALINE SPRAY 0.65 % NA SOLN
1.0000 | NASAL | Status: DC | PRN
  Administered 2011-12-21 – 2011-12-22 (×3): 1 via NASAL
  Filled 2011-12-21: qty 44

## 2011-12-21 MED ORDER — GUAIFENESIN 100 MG/5ML PO SOLN
10.0000 mL | ORAL | Status: DC | PRN
  Administered 2011-12-21 – 2011-12-22 (×5): 200 mg via ORAL

## 2011-12-21 MED ORDER — TRAZODONE HCL 100 MG PO TABS
100.0000 mg | ORAL_TABLET | Freq: Every day | ORAL | Status: DC
  Administered 2011-12-21 – 2011-12-22 (×2): 100 mg via ORAL
  Filled 2011-12-21 (×3): qty 1

## 2011-12-21 NOTE — Progress Notes (Signed)
Patient ID: Rebekah Pace, female   DOB: 1956/09/14, 56 y.o.   MRN: [id number] [name] is fully alert with a grim affect this morning and adequate hygiend, fully dressed appropriately.  She complains of feeling over medicated and drowsy this morning and her BP is 90/55 with a pulse of 110.  She thinks she is on too much Trazodone at night and I agree with her.   She is pleasant and cooperative although she admits that she is irritated and little depressed - only because she knows she cannot go home today.  Agrees that she needs to stay but still wishes she was at home. Denies suicidal thoughts, and anxiety.  Denies homicidal thoughts.  She is taking all meds as prescribed.  Plan:  Trazodone decreased to 100mg  HS

## 2011-12-21 NOTE — Progress Notes (Signed)
Pt is intrusive and has loose boundaries  She attends and participates in most groups  She can be demanding at times and insists that request be carried out immediately  She can be redirected without difficulty   Verbal support given  Medications administered and effectiveness monitored  Q 15 min checks  Pt safe at present

## 2011-12-21 NOTE — Progress Notes (Signed)
Patient ID: Rebekah Pace, female   DOB: 07/05/1956, 56 y.o.   MRN: [id number] [facility] Group Notes:  (Counselor/Nursing/MHT/Case Management/Adjunct)  12/21/2011 11 AM  Type of Therapy:  Group Therapy, Dance/Movement Therapy   Participation Level:  Did Not Attend   Kym Groom

## 2011-12-22 NOTE — Progress Notes (Signed)
Patient ID: Rebekah Pace, female   DOB: August 16, 1956, 56 y.o.   MRN: [id number] Has been out on hall this evening, friendly, sad, stated she had gotten off her meds, stated she had had trouble opening the bubble packs of samples she went home with.  Has been cooperative and pleasant, somewhat guarded, but voices desire to get back on track. Attended group, interacting with select peers and staff.  Denies SI/HI, will continue to monitor.

## 2011-12-22 NOTE — Progress Notes (Signed)
Pt is anxious and frightened this morning   Another pt was being loud and aggressive and she said it was so upsetting and brought back painful memories   Pt is less intrusive and her thinking is logical and coherent  She endorsed getting a good nights rest   Pt continues to complain of stuffiness and cough and received medications ordered   Verbal support given   Medications administered and effectiveness monitored  Q 15 min checks  Pt safe at present

## 2011-12-22 NOTE — Progress Notes (Signed)
Patient ID: Rebekah Pace, female   DOB: 03-12-1956, 56 y.o.   MRN: [id number]   [facility] Group Notes:  (Counselor/Nursing/MHT/Case Management/Adjunct)  12/22/2011 11 AM  Type of Therapy:  Group Therapy, Dance/Movement Therapy   Participation Level:  Active  Participation Quality:  Appropriate  Affect:  Appropriate  Cognitive:  Oriented  Insight:  Limited  Engagement in Group:  Good  Engagement in Therapy:  Good  Modes of Intervention:  Clarification, Problem-solving, Role-play, Socialization and Support  Summary of Progress/Problems:  Group practiced healthy interpersonal and intrapersonal communication through movement. Pt shared that she felt happy and that the group was pleasurable for her. She displayed a great sense of control and a sense of stability in her movements as well as clarity. This suggests that the pt has the ability and confidence to shift her recovery plan into action.    Thomasena Edis, [employer]

## 2011-12-22 NOTE — Progress Notes (Signed)
Patient ID: [name], female   DOB: 1956/02/16, 56 y.o.   MRN: [id number] Rebekah Pace presents fully groomed and fully alert, calm and cooperative. She says she is anxious to go home. Eye contact is good, speech is fluent, and nonpressured. Thinking is somewhat rambling but generally logical and goal-directed. She has been watching Delrae Rend on television and is talking about his philosophy about how to deal with difficult people. She is applying this to difficult people in her life.  She is worried about her hepatitis C and asks me to recheck her previous appointment scheduled. She ruminates about dying from hepatitis C. She is requesting to go home. She reports that she is sleeping very well and its much improved, her concentration is good, feels she has good support at home with friends from her church, and personal friends. She denies suicidal thoughts, denies depressed mood, rates anxiety low one to 2/10 based on her anticipation of leaving. She is requesting discharge in the morning.  Plan:  Continue current plan and consider for discharge in AM with team input.

## 2011-12-22 NOTE — Progress Notes (Signed)
Patient ID: Rebekah Pace, female   DOB: 29-Oct-1955, 56 y.o.   MRN: [id number] Has been pleasant , cooperative , quiet this evening, but says she feels better and intends to stay on her meds this time when she is discharged.  Speech has been logical, affect  Is sad, flat, but states she is doing bette, good eye contact.  Attended group, has been interacting with select peers and staff appropriately, denies SI/HI.  Will continue to monitor.

## 2011-12-22 NOTE — Progress Notes (Signed)
Patient ID: Rebekah Pace, female   DOB: [birth year], 56 y.o.   MRN: [id number]  Pt. attended and participated in aftercare planning group. Pt shared that she was somewhat sleepy and awake. Pt. listed their current anxiety level as 0 and depression as a 0. Pt. accepted information on suicide prevention, warning signs to look for with suicide and crisis line numbers to use. The pt. agreed to call crisis line numbers if having warning signs or having thoughts of suicide. Pt denied S/I and H/I. Pt said that she wants to be D/C tomorrow. She plans to go home and will be having dinner with her sone tomorrow night. Pt reported that her friend, Verlin Dike, will be picking her up. She also said that she plans to be consistent with her medications, and that she wants to find a counselor in [place].

## 2011-12-23 MED ORDER — ZIPRASIDONE HCL 80 MG PO CAPS: 80.0000 mg | ORAL_CAPSULE | Freq: Two times a day (BID) | ORAL | Status: DC

## 2011-12-23 MED ORDER — LUBRIDERM SERIOUSLY SENSITIVE EX LOTN: 1.0000 "application " | TOPICAL_LOTION | CUTANEOUS | Status: DC

## 2011-12-23 MED ORDER — TRAZODONE HCL 100 MG PO TABS: 100.0000 mg | ORAL_TABLET | Freq: Every day | ORAL | Status: DC

## 2011-12-23 MED ORDER — CYANOCOBALAMIN 100 MCG PO TABS: 100.0000 ug | ORAL_TABLET | Freq: Every day | ORAL | Status: DC

## 2011-12-23 MED ORDER — LEVOTHYROXINE SODIUM 75 MCG PO TABS: 75.0000 ug | ORAL_TABLET | Freq: Every day | ORAL | Status: DC

## 2011-12-23 NOTE — [facility] Suicide Risk Assessment (Signed)
Suicide Risk Assessment  Discharge Assessment     Demographic factors:  Assessment Details Time of Assessment: Admission Information Obtained From: Patient Current Mental Status:  AO x 3. Risk Reduction Factors:  Risk Reduction Factors: Religious beliefs about death  CLINICAL FACTORS:   Previous Psychiatric Diagnoses and Treatments Schizoaffective Disorder - Bipolar Type.  COGNITIVE FEATURES THAT CONTRIBUTE TO RISK:  None Noted.    Diagnosis:  Axis I: Schizoaffective Disorder - Bipolar Type.   The patient was seen today and reports the following:   Sleep: The patient reports to sleeping well without difficulty. Appetite: The patient reports a good appetite.   Mild>(1-10) >Severe  Hopelessness (1-10): 0  Depression (1-10): 0  Anxiety (1-10): 0.   Suicidal Ideation: The patient adamantly denies any suicidal ideations today.  Plan: No  Intent: No  Means: No   Homicidal Ideation: The patient adamantly denies any homicidal ideations today.  Plan: No  Intent: No.  Means: No   Eye Contact: Good.  General Appearance/Behavior: The patient was friendly and cooperative with staff.  Motor Behavior: wnl with no agitation noted today.  Speech: Appropriate in rate and volume.  Mental Status: Alert and Oriented x 3  Level of Consciousness: Alert  Mood: Euthymic.  Affect: Bright and full..  Anxiety: No anxiety reported today.  Thought Process: Coherent  Thought Content: The patient denies any auditory or visual hallucinations or delusional thinking.  Perception: wnl.  Judgment: Fair to good.  Insight: Fair to good.  Cognition: Orientated to time, place and person.   Time was spent today discussing with the patient her ongoing symptoms. The patient was much more focused today with no pressured speech and no flight of ideas.  The patient states that she is looking forward to discharge and plans to continue to organize and clean her home and spend time with her animals.  She  denies any medication side effects and agrees to follow up as scheduled with her ACT team.  Treatment Plan Summary:  1. Daily contact with patient to assess and evaluate symptoms and progress in treatment  2. Medication management  3. The patient will deny suicidal ideations or homicidal ideations for 48 hours prior to discharge and have a depression and anxiety rating of 3 or less. The patient will also deny any auditory or visual hallucinations or delusional thinking or display any manic or hypomanic behaviors.   Plan:  1. Will continue current medications.  2. Will continue to monitor.  3. Laboratory studies reviewed. 4. Discharge today to outpatient follow up with ACT team.  SUICIDE RISK:   Minimal: No identifiable suicidal ideation.  Patients presenting with no risk factors but with morbid ruminations; may be classified as minimal risk based on the severity of the depressive symptoms  Yahsir Wickens 12/23/2011, 10:13 AM

## 2011-12-23 NOTE — Discharge Summary (Signed)
Physician Discharge Summary Note  Patient:  Rebekah Pace is an 56 y.o., female MRN:  [id number] DOB:  [birth year] Patient phone:  [phone] (home)  Patient address:   [address] [place] [address],   Date of Admission:  12/16/2011 Date of Discharge: 12/23/2011  Discharge Diagnoses: Principal Problem:  *Schizoaffective disorder, bipolar type   Axis Diagnosis:   AXIS I:  Schizoaffective Disorder, Bipolar Type AXIS II:  deferred AXIS III:  Hepatitis C Past Medical History  Diagnosis Date  . Thyroid disease   . Anxiety   . Mental disorder   . Depression   . Hepatitis   . Hypothyroidism    AXIS IV:  Burden of illness issues, supportive social contacts are an asset AXIS V:  51-60 moderate symptoms  Level of Care:  OP  Hospital Course:  This was the fourth admission for [facility] who presented with law enforcement to the emergency room after she had been disrobing at church. She had recently been on our unit with a similar presentation three weeks previously.   She presented with flight of ideas and hyperverbal, with some loose associations. Also felt guilty and sad that she had to come back to the hospital. She was cooperative, with mildly suggested behavior, but was directable. She reported difficulty getting her medications from [facility], and keeping followup appointments. She had also been given samples but had difficulty manipulating the packaging.  She was admitted for acute stabilization unit and was restarted on Geodon 80 mg twice daily, on which she had previously done very well. Her recovery here was uneventful, she was calm cooperative and polite with staff and peers. Group therapy was productive. She reported ongoing concerns and fears of death related to her hepatitis C diagnosis, and followup is scheduled. Her liver enzymes are well within normal limits.   She also reported ongoing concerns about her skin, which may be delusional. In the past she has had  multiple complaints of lesions are not visible, and a sense that her skin is overly oily.  She is ready for discharge today and has secured some social supports at home. We will phone her medications into her local pharmacy at her request, she will not receive samples today.  Consults:  None  Significant Diagnostic Studies:  Liver enzymes are normal. CBC unremarkable.   Discharge Vitals:   Blood pressure 80/55, pulse 98, temperature 97.8 F (36.6 C), temperature source Oral, resp. rate 20, height 5' 2.5" (1.588 m), weight 64.411 kg (142 lb).  Mental Status Exam: See Mental Status Examination and Suicide Risk Assessment completed by Attending Physician prior to discharge.  Discharge destination:  Home  Is patient on multiple antipsychotic therapies at discharge:  No   Has Patient had three or more failed trials of antipsychotic monotherapy by history:  No  Recommended Plan for Multiple Antipsychotic Therapies: N/A   Medication List  As of 12/23/2011 12:01 PM   TAKE these medications      Indication    cyanocobalamin 100 MCG tablet   Take 1 tablet (100 mcg total) by mouth daily. B12 supplement       levothyroxine 75 MCG tablet   Commonly known as: SYNTHROID, LEVOTHROID   Take 1 tablet (75 mcg total) by mouth daily. Thyroid supplement.       lubriderm seriously sensitive Lotn   Apply 1 application topically 3 (three) times daily at 8am, 2pm and bedtime. For dry skin       traZODone 100 MG tablet  Commonly known as: DESYREL   Take 1 tablet (100 mg total) by mouth at bedtime. For sleep.       ziprasidone 80 MG capsule   Commonly known as: GEODON   Take 1 capsule (80 mg total) by mouth 2 (two) times daily with a meal. For mood stability and clear thoughts.            Follow-up Information    Follow up with ZACKS,STEVEN L, MD on 03/19/2012. (Be there at 2pm for a 2:30 appt.)    Contact information:   [address], [address] [place]  [address] [phone] Hepatitis C clinic         Follow-up recommendations:  Activity:  unrestricted Diet:  regular  Signed: Sofi Bryars A 12/23/2011, 12:01 PM

## 2011-12-23 NOTE — Tx Team (Signed)
Interdisciplinary Treatment Plan Update (Adult)  Date:  12/23/2011  Time Reviewed:  10:15AM-11:00AM  Progress in Treatment: Attending groups:  Yes Participating in groups:    Yes Taking medication as prescribed:    Yes, no refusals Tolerating medication:   Yes, no side effects have been noted by staff or reported by patient Family/Significant other contact made:  No need to do this Patient understands diagnosis:   Yes Discussing patient identified problems/goals with staff:   Yes, fully engaged Medical problems stabilized or resolved:   Yes Denies suicidal/homicidal ideation:  Yes Issues/concerns per patient self-inventory:   None Other:    New problem(s) identified: No, Describe:    Reason for Continuation of Hospitalization: None  Interventions implemented related to continuation of hospitalization:  Medication monitoring and adjustment, safety checks Q15 min., suicide risk assessment, group therapy, psychoeducation, collateral contact, aftercare planning, ongoing physician assessments, medication education - until discharge   Additional comments:  Not applicable  Estimated length of stay:  Discharge today  Discharge Plan:  Return home alone, follow up with [facility].  New goal(s):  Not applicable  Review of initial/current patient goals per problem list:   1.  Goal(s):  Reduce anxiety from admission level to no more than 3 at discharge.  Met:  Yes  Target date:  By Discharge   As evidenced by:  "0" today  2.  Goal(s):  Met previously  3.  Goal(s):  Reduce mania to baseline.  Met:  Yes  Target date:  By Discharge   As evidenced by:    4.  Goal(s):  Met previously  Attendees: Patient:  Rebekah Pace  12/23/2011 10:15AM-11:00AM  Family:     Physician:  Dr. Harvie Heck Readling 12/23/2011 10:15AM-11:00AM  Nursing:   Tacy Learn, RN 12/23/2011 10:15AM -11:00AM   Case Manager:  Ambrose Mantle, LCSW 12/23/2011 10:15AM-11:00AM  Counselor:  [name], MT-BC  12/23/2011 10:15AM-11:00AM  Other:   Lynann Bologna, NP 12/23/2011 10:15AM-11:00AM  Other:   Roswell Miners, RN 12/23/2011 10:15AM-11:00AM  Other:      Other:       Scribe for Treatment Team:   Sarina Ser, 12/23/2011, 10:15AM-11:15AM

## 2011-12-23 NOTE — Progress Notes (Signed)
[facility] Case Management Discharge Plan:  Will you be returning to the same living situation after discharge: Yes,  lives alone At discharge, do you have transportation home?:Yes,  friend to pick up Do you have the ability to pay for your medications:Yes,  has insurance, is being provide samples  Interagency Information:     Release of information consent forms completed and in the chart;  Patient's signature needed at discharge.  Patient to Follow up at:  Follow-up Information    Follow up with ZACKS,STEVEN L, MD on 03/19/2012. (Be there at 2pm for a 2:30 appt.)    Contact information:   [address] [place] [address] [phone] Hepatitis C clinic      Follow up with [facility] on 12/24/2011. (1PM appointment - they will come to your house for this.)    Contact information:   [address] [place] Telephone:  [phone]         Patient denies SI/HI:   Yes,      Safety Planning and Suicide Prevention discussed:  Yes,  During Aftercare Planning Group on multiple occasions, Case Manager provided psychoeducation on "Suicide Prevention Information."  This included descriptions of risk factors for suicide, warning signs that an individual is in crisis and thinking of suicide, and what to do if this occurs.  Pt indicated understanding of information provided, and will read brochure given upon discharge.     Barrier to discharge identified:No.  Summary and Recommendations:  Patient will follow up with recommendation of Comprehensive Clinical Assessment of [facility].  Due to the number of hospitalizations, they believe at this point that it will be [facility].   Sarina Ser 12/23/2011, 12:17 PM

## 2011-12-23 NOTE — Progress Notes (Signed)
Pt discharged at this time, pt denies any suicidality, pt given all discharge information and pt verbalized understanding, all belongings returned

## 2011-12-25 NOTE — Progress Notes (Signed)
Patient Discharge Instructions:  Psychiatric Admission Assessment Note Provided,  12/25/2011 Discharge Summary Note Provided,   12/25/2011 After Visit Summary (AVS) Provided,  12/25/2011 Face Sheet Provided, 12/25/2011 Faxed/Sent to the Next Level Care provider:  12/25/2011  Faxed to Dr. Rolanda Lundborg [facility] @ [phone] And to [facility] @ [phone]  [name], 12/25/2011, 4:31 PM

## 2011-12-26 ENCOUNTER — Encounter (HOSPITAL_COMMUNITY): Payer: Self-pay | Admitting: Emergency Medicine

## 2011-12-26 ENCOUNTER — Emergency Department (HOSPITAL_COMMUNITY)
Admission: EM | Admit: 2011-12-26 | Discharge: 2011-12-27 | Disposition: A | Discharge status: Home / Self Care | Payer: Medicaid Other | Attending: Emergency Medicine | Admitting: Emergency Medicine

## 2011-12-26 ENCOUNTER — Other Ambulatory Visit: Payer: Self-pay

## 2011-12-26 DIAGNOSIS — F259 Schizoaffective disorder, unspecified: Secondary | ICD-10-CM | POA: Insufficient documentation

## 2011-12-26 DIAGNOSIS — F411 Generalized anxiety disorder: Secondary | ICD-10-CM | POA: Insufficient documentation

## 2011-12-26 DIAGNOSIS — E039 Hypothyroidism, unspecified: Secondary | ICD-10-CM | POA: Insufficient documentation

## 2011-12-26 DIAGNOSIS — F309 Manic episode, unspecified: Secondary | ICD-10-CM | POA: Insufficient documentation

## 2011-12-26 DIAGNOSIS — F22 Delusional disorders: Secondary | ICD-10-CM | POA: Insufficient documentation

## 2011-12-26 DIAGNOSIS — F419 Anxiety disorder, unspecified: Secondary | ICD-10-CM

## 2011-12-26 LAB — CBC
Hemoglobin: 13.5 g/dL (ref 12.0–15.0)
MCH: 29.3 pg (ref 26.0–34.0)
MCV: 86.3 fL (ref 78.0–100.0)
Platelets: 240 10*3/uL (ref 150–400)
RBC: 4.61 MIL/uL (ref 3.87–5.11)
WBC: 8.6 10*3/uL (ref 4.0–10.5)

## 2011-12-26 LAB — COMPREHENSIVE METABOLIC PANEL
ALT: 38 U/L — ABNORMAL HIGH (ref 0–35)
AST: 36 U/L (ref 0–37)
CO2: 29 mEq/L (ref 19–32)
Chloride: 99 mEq/L (ref 96–112)
Creatinine, Ser: 0.71 mg/dL (ref 0.50–1.10)
GFR calc Af Amer: >90 mL/min (ref >90)
GFR calc non Af Amer: >90 mL/min (ref >90)
Glucose, Bld: 106 mg/dL — ABNORMAL HIGH (ref 70–99)
Total Bilirubin: 0.3 mg/dL (ref 0.3–1.2)

## 2011-12-26 LAB — URINALYSIS, ROUTINE W REFLEX MICROSCOPIC
Nitrite: NEGATIVE
Protein, ur: NEGATIVE mg/dL
Specific Gravity, Urine: 1.018 (ref 1.005–1.030)
Urobilinogen, UA: 0.2 mg/dL (ref 0.0–1.0)

## 2011-12-26 LAB — RAPID URINE DRUG SCREEN, HOSP PERFORMED
Amphetamines: NOT DETECTED
Tetrahydrocannabinol: NOT DETECTED

## 2011-12-26 LAB — SALICYLATE LEVEL: Salicylate Lvl: <2 mg/dL — ABNORMAL LOW (ref 2.8–20.0)

## 2011-12-26 MED ORDER — LORAZEPAM 2 MG/ML IJ SOLN
1.0000 mg | Freq: Once | INTRAMUSCULAR | Status: AC
  Administered 2011-12-26: 1 mg via INTRAVENOUS
  Filled 2011-12-26: qty 1

## 2011-12-26 MED ORDER — ALUM & MAG HYDROXIDE-SIMETH 200-200-20 MG/5ML PO SUSP: 30.0000 mL | ORAL | Status: DC | PRN

## 2011-12-26 MED ORDER — ACETAMINOPHEN 325 MG PO TABS: 650.0000 mg | ORAL_TABLET | ORAL | Status: DC | PRN

## 2011-12-26 MED ORDER — ZIPRASIDONE MESYLATE 20 MG IM SOLR
20.0000 mg | Freq: Once | INTRAMUSCULAR | Status: AC
  Administered 2011-12-26: 20 mg via INTRAMUSCULAR
  Filled 2011-12-26: qty 20

## 2011-12-26 MED ORDER — IBUPROFEN 600 MG PO TABS: 600.0000 mg | ORAL_TABLET | Freq: Three times a day (TID) | ORAL | Status: DC | PRN

## 2011-12-26 MED ORDER — TRAZODONE HCL 100 MG PO TABS: 100.0000 mg | ORAL_TABLET | Freq: Every evening | ORAL | Status: DC | PRN

## 2011-12-26 MED ORDER — ZIPRASIDONE HCL 20 MG PO CAPS
80.0000 mg | ORAL_CAPSULE | Freq: Two times a day (BID) | ORAL | Status: DC
  Administered 2011-12-26: 80 mg via ORAL
  Filled 2011-12-26: qty 4

## 2011-12-26 MED ORDER — ZIPRASIDONE MESYLATE 20 MG IM SOLR
10.0000 mg | Freq: Once | INTRAMUSCULAR | Status: AC
  Administered 2011-12-26: 10 mg via INTRAMUSCULAR
  Filled 2011-12-26: qty 20

## 2011-12-26 MED ORDER — ONDANSETRON HCL 4 MG PO TABS: 4.0000 mg | ORAL_TABLET | Freq: Three times a day (TID) | ORAL | Status: DC | PRN

## 2011-12-26 MED ORDER — LEVOTHYROXINE SODIUM 75 MCG PO TABS
75.0000 ug | ORAL_TABLET | Freq: Every day | ORAL | Status: DC
  Filled 2011-12-26 (×2): qty 1

## 2011-12-26 MED ORDER — LORAZEPAM 1 MG PO TABS: 1.0000 mg | ORAL_TABLET | Freq: Three times a day (TID) | ORAL | Status: DC | PRN

## 2011-12-26 MED ORDER — NICOTINE 21 MG/24HR TD PT24: 21.0000 mg | MEDICATED_PATCH | Freq: Every day | TRANSDERMAL | Status: DC

## 2011-12-26 MED ORDER — SODIUM CHLORIDE 0.9 % IV BOLUS (SEPSIS)
1000.0000 mL | Freq: Once | INTRAVENOUS | Status: AC
  Administered 2011-12-26: 1000 mL via INTRAVENOUS

## 2011-12-26 NOTE — ED Notes (Signed)
Pt as seen here 2 days ago, found inside the house they know her and she was lying in the bathrrom top less leaning over the toliet. Neighbor called ems due to pt came over to get a cup of coffee pacing the floor, very anxious, pt states that she does take geodon but did not take it today and has not been sleeping due to she has been cleaning her house. Pt very restless not able to sit still gpd is at bedside and having to help hold her hands and to keep her on the bed.

## 2011-12-26 NOTE — BH Assessment (Signed)
Assessment Note   Rebekah Pace is an 56 y.o. female. ED Notes indicate that pt was seen here 2 days ago, after she was found inside her house on the bathroom floor topless leaning over the toliet. Neighbor called EMS due to pt's bizarre behaviors. Notes state that pt came over to get a cup of coffee pacing the floor, very anxious. Pt states that she takes geodon but did not take it today and has not been sleeping. Pt has been cleaning her house obsessively. Writer attempted to assess patient, however; pt would not cooperative. She stated, "I am not here for that.Marland KitchenMarland KitchenI've had a recent assessment...". Pt refused to answer questions. She appeared to be manic. Pt was rambling about various subjects: medications, health issues, family, etc. Patient has a flight of ideas. [employer] initially at patients bedside upon her arrival in the ED. Patient remained calm and cooperative until Clinical research associate asked patient to answer assessment questions.     Axis I: Bipolar, Manic Axis II: Deferred Axis III:  Past Medical History  Diagnosis Date  . Thyroid disease   . Anxiety   . Mental disorder   . Depression   . Hepatitis   . Hypothyroidism    Axis IV: other psychosocial or environmental problems, problems related to social environment, problems with access to health care services and problems with primary support group Axis V: 31-40 impairment in reality testing  Past Medical History:  Past Medical History  Diagnosis Date  . Thyroid disease   . Anxiety   . Mental disorder   . Depression   . Hepatitis   . Hypothyroidism     Past Surgical History  Procedure Date  . Arm fracture   . Rotator cuff repair     Family History: No family history on file.  Social History:  reports that she has never smoked. She has never used smokeless tobacco. She reports that she drinks alcohol. She reports that she does not use illicit drugs.  Additional Social History:  Alcohol / Drug Use Pain Medications: unknown; pt  refuses to answer Prescriptions: unknown; pt refuses to answer Over the Counter: unknown; pt refuses to answer Longest period of sobriety (when/how long): unknown; pt refuses to cooperate with assessment;  prior notes indicate that pt has no history of alcohol or drug use. Allergies:  Allergies  Allergen Reactions  . Sulfa Antibiotics Rash    Home Medications:  Medications Prior to Admission  Medication Dose Route Frequency Provider Last Rate Last Dose  . acetaminophen (TYLENOL) tablet 650 mg  650 mg Oral Q4H PRN Forbes Cellar, MD      . alum & mag hydroxide-simeth (MAALOX/MYLANTA) 200-200-20 MG/5ML suspension 30 mL  30 mL Oral PRN Forbes Cellar, MD      . ibuprofen (ADVIL,MOTRIN) tablet 600 mg  600 mg Oral Q8H PRN Forbes Cellar, MD      . levothyroxine (SYNTHROID, LEVOTHROID) tablet 75 mcg  75 mcg Oral QAC breakfast Forbes Cellar, MD      . LORazepam (ATIVAN) injection 1 mg  1 mg Intravenous Once Forbes Cellar, MD   1 mg at 12/26/11 0903  . LORazepam (ATIVAN) tablet 1 mg  1 mg Oral Q8H PRN Forbes Cellar, MD      . nicotine (NICODERM CQ - dosed in mg/24 hours) patch 21 mg  21 mg Transdermal Daily Forbes Cellar, MD      . ondansetron [facility]) tablet 4 mg  4 mg Oral Q8H PRN Forbes Cellar, MD      .  sodium chloride 0.9 % bolus 1,000 mL  1,000 mL Intravenous Once Forbes Cellar, MD   1,000 mL at 12/26/11 0902  . traZODone (DESYREL) tablet 100 mg  100 mg Oral QHS PRN Forbes Cellar, MD      . ziprasidone (GEODON) capsule 80 mg  80 mg Oral BID WC Forbes Cellar, MD      . ziprasidone (GEODON) injection 10 mg  10 mg Intramuscular Once Forbes Cellar, MD       Medications Prior to Admission  Medication Sig Dispense Refill  . cyanocobalamin 100 MCG tablet Take 100 mcg by mouth daily. B12 supplement forgot to put it in the bag, got last in the hosp      . Emollient (LUBRIDERM SERIOUSLY SENSITIVE) LOTN Apply 1 application topically 3 (three) times daily at 8am, 2pm and bedtime. For dry  skin. May not have been picked up      . levothyroxine (SYNTHROID, LEVOTHROID) 75 MCG tablet Take 1 tablet (75 mcg total) by mouth daily. Thyroid supplement.  30 tablet  0  . traZODone (DESYREL) 100 MG tablet Take 100 mg by mouth at bedtime. For sleep.      . ziprasidone (GEODON) 80 MG capsule Take 80 mg by mouth 2 (two) times daily with a meal. For mood stability and clear thoughts.        OB/GYN Status:  No LMP recorded. Patient is postmenopausal.  General Assessment Data ACT Assessment: Yes Living Arrangements:  (unk) Can pt return to current living arrangement?: No Admission Status: Involuntary Is patient capable of signing voluntary admission?: No Transfer from: Acute Hospital Referral Source: Other  Education Status Is patient currently in school?: No (Neighbor)  Risk to self Suicidal Ideation: No Suicidal Intent:  (pt refuses to answer questions and/or cooperate with the ass) Is patient at risk for suicide?: No Suicidal Plan?: No Access to Means: No What has been your use of drugs/alcohol within the last 12 months?:  (unk) Previous Attempts/Gestures:  (unk; prior notes indicate no prior attempts/gestures) How many times?:  (0) Other Self Harm Risks:  (n/a) Triggers for Past Attempts: None known Intentional Self Injurious Behavior: None Family Suicide History: No Recent stressful life event(s): Other (Comment);Conflict (Comment) (pt refuse to answer; notes from  2/14 indicate family confli) Persecutory voices/beliefs?:  (unk) Depression: No Depression Symptoms:  (none noted; pt refuses to answer) Substance abuse history and/or treatment for substance abuse?: Yes Suicide prevention information given to non-admitted patients: Not applicable  Risk to Others Homicidal Ideation: No Thoughts of Harm to Others: No Current Homicidal Intent: No Current Homicidal Plan: No Access to Homicidal Means: No Identified Victim:  (n/a) History of harm to others?: No Assessment of  Violence: None Noted Violent Behavior Description:  (pt uncooperative with assessment; manic behaviors witnessed) Does patient have access to weapons?:  (unk) Criminal Charges Pending?:  (unk) Describe Pending Criminal Charges:  (n/a) Does patient have a court date:  (unk) Court Date:  (n/a)  Psychosis Hallucinations:  (unk; pt refuses to answer) Delusions: Unspecified (unk; unable to determine; refuses to answer)  Mental Status Report Appear/Hygiene: Improved Eye Contact: Fair Motor Activity: Hyperactivity;Tremors Speech: Argumentative Level of Consciousness: Restless;Alert Mood: Anxious Affect: Anxious;Inconsistent with thought content Anxiety Level:  (unk) Thought Processes: Circumstantial;Flight of Ideas Judgement: Impaired Orientation: Unable to assess Obsessive Compulsive Thoughts/Behaviors: None  Cognitive Functioning Concentration: Decreased Memory: Recent Impaired;Remote Impaired IQ: Average Insight: Poor Impulse Control: Poor Appetite:  (unk) Weight Loss:  (unk) Weight Gain:  (unk) Sleep:  (unk) Total  Hours of Sleep:  (unk) Vegetative Symptoms:  (unk)  Prior Inpatient Therapy Prior Inpatient Therapy: Yes Prior Therapy Dates: 8/12, 10/12, 11/12 Prior Therapy Facilty/Provider(s): [facility] Reason for Treatment: psychosis   Prior Outpatient Therapy Prior Outpatient Therapy: Yes Prior Therapy Dates: ongoing Prior Therapy Facilty/Provider(s):  (12/24/2010 initial appt. with [facility] Reason for Treatment:  (psychiatric diagnosis, med mgt., etc.)  ADL Screening (condition at time of admission) Patient's cognitive ability adequate to safely complete daily activities?: Yes Patient able to express need for assistance with ADLs?: Yes Independently performs ADLs?: Yes Weakness of Legs: None Weakness of Arms/Hands: None  Home Assistive Devices/Equipment Home Assistive Devices/Equipment: None    Abuse/Neglect Assessment (Assessment to be complete while  patient is alone) Physical Abuse:  (unknown; pt refuses to respond) Verbal Abuse:  (unk; pt refuses to respond; prior notes indicate verbal abus) Sexual Abuse:  (unk; pt refuses to respond) Exploitation of patient/patient's resources:  (unk; pt refuses to respond) Self-Neglect: Denies Values / Beliefs Cultural Requests During Hospitalization: None Spiritual Requests During Hospitalization: None   Advance Directives (For Healthcare) Advance Directive: Patient does not have advance directive Nutrition Screen Diet: Regular Unintentional weight loss greater than 10lbs within the last month:  (unk; pt refuses to answer; prior notes indicate wt. loss) Problems chewing or swallowing foods and/or liquids: No Home Tube Feeding or Total Parenteral Nutrition (TPN): No Patient appears severely malnourished: No Pregnant or Lactating: No  Additional Information 1:1 In Past 12 Months?: No CIRT Risk: No Elopement Risk: No Does patient have medical clearance?: Yes     Disposition:  Disposition Disposition of Patient: Inpatient treatment program;Referred to [facility], [facility], and [facility]) Type of inpatient treatment program: Adult  On Site Evaluation by:   Reviewed with Physician:     Melynda Ripple [facility] 12/26/2011 11:55 AM

## 2011-12-26 NOTE — BH Assessment (Addendum)
Attempted to assess patient, however; she refused assessment. Sts, "I am not here for that.Marland KitchenMarland KitchenI came in for medical issues .Marland KitchenMarland KitchenMarland KitchenI need to leave right now". Writer informed Dr. Hyman Hopes that patient has refused assessment. Dr. Hyman Hopes states that pt will be IVC'd if she does not cooperate. Writer informed patient that she would need to cooperate or she would be placed under involuntary commitment. Patient became argumentative, yelling, used profanity, and appeared to be in a manic state at this time. Patient's nurse is not around at this time. Informed another nurse and charge nurse of pt's behavior and that patient is threatening to leave. Charge nurse aware that they may need to complete IVC papers.  Writer will enter the [facility] assessment based on collaborative information at this time.

## 2011-12-26 NOTE — ED Notes (Signed)
Bed:WA23<BR> Expected date:<BR> Expected time:<BR> Means of arrival:<BR> Comments:<BR> ems

## 2011-12-26 NOTE — ED Provider Notes (Signed)
History     CSN: [id number]  Arrival date & time 12/26/11  0807   First MD Initiated Contact with Patient 12/26/11 [phone]      Chief Complaint  Patient presents with  . Medical Clearance    HPI  55yoF history of schizoaffective disorder bipolar type presents with multiple complaints. The patient states that she has not slept in the past 2-3 days. She states that she has been cleaning her house over and over. She went to a neighbor's house this morning requesting a cup of coffee. Apparently she was repeating herself. Even after she had obtained a cup of coffee she did not drink it and continued to ask for coffee. She went to the bathroom a neighbor found her topless and leaning over the toilet. The patient states that she was nauseated at that time. On EMS arrival they stated the patient was shaking and appeared anxious. They state that without prompting she rapidly reported most of her life history as well as her family history. Patient states that she is feeling anxious at this time. She states that she has not been compliant with her trazodone, Geodon, Synthroid for unknown reasons. She denies suicidal ideation, homicidal ideation, auditory visual hallucinations. She states she has been more depressed recently secondary to recent diagnosis of hepatitis C. She states she feels thinks she is going to die.   ED Notes, ED Provider Notes from 12/26/11 0000 to 12/26/11 08:14:56       Sherran Needs, RN 12/26/2011 08:14      Pt as seen here 2 days ago, found inside the house they know her and she was lying in the bathrrom top less leaning over the toliet. Neighbor called ems due to pt came over to get a cup of coffee pacing the floor, very anxious, pt states that she does take geodon but did not take it today and has not been sleeping due to she has been cleaning her house. Pt very restless not able to sit still gpd is at bedside and having to help hold her hands and to keep her on the bed.        Sherran Needs, RN 12/26/2011 08:10      [id number]  Expected date:  Expected time:  Means of arrival:  Comments:  ems    Past Medical History  Diagnosis Date  . Thyroid disease   . Anxiety   . Mental disorder   . Depression   . Hepatitis   . Hypothyroidism     Past Surgical History  Procedure Date  . Arm fracture   . Rotator cuff repair     No family history on file.  History  Substance Use Topics  . Smoking status: Never Smoker   . Smokeless tobacco: Never Used  . Alcohol Use: Yes    OB History    Grav Para Term Preterm Abortions TAB SAB Ect Mult Living                  Review of Systems  All other systems reviewed and are negative.  except as noted HPI   Allergies  Sulfa antibiotics  Home Medications   Current Outpatient Rx  Name Route Sig Dispense Refill  . CYANOCOBALAMIN 100 MCG PO TABS Oral Take 100 mcg by mouth daily. B12 supplement forgot to put it in the bag, got last in the hosp    . LUBRIDERM SERIOUSLY SENSITIVE EX LOTN Topical Apply 1 application topically 3 (three) times daily  at 8am, 2pm and bedtime. For dry skin. May not have been picked up    . LEVOTHYROXINE SODIUM 75 MCG PO TABS Oral Take 1 tablet (75 mcg total) by mouth daily. Thyroid supplement. 30 tablet 0  . TRAZODONE HCL 100 MG PO TABS Oral Take 100 mg by mouth at bedtime. For sleep.    Marland Kitchen ZIPRASIDONE HCL 80 MG PO CAPS Oral Take 80 mg by mouth 2 (two) times daily with a meal. For mood stability and clear thoughts.      BP 116/87  Pulse 98  Temp(Src) 99.1 F (37.3 C) (Oral)  Resp 18  SpO2 100%  Physical Exam  Nursing note and vitals reviewed. Constitutional: She is oriented to person, place, and time. She appears well-developed.  HENT:  Head: Atraumatic.       Mm dry  Eyes: Conjunctivae and EOM are normal. Pupils are equal, round, and reactive to light.  Neck: Normal range of motion. Neck supple.  Cardiovascular: Regular rhythm, normal heart sounds and intact distal  pulses.        tachycardic  Pulmonary/Chest: Effort normal and breath sounds normal. No respiratory distress. She has no wheezes. She has no rales.  Abdominal: Soft. She exhibits no distension. There is no tenderness. There is no rebound and no guarding.       Pt stating "push down again, push down harder" then pushing her own abdominal cavity  Musculoskeletal: Normal range of motion.  Neurological: She is alert and oriented to person, place, and time.  Skin: Skin is warm and dry. No rash noted. There is pallor.  Psychiatric: She has a normal mood and affect.      ED Course  Procedures (including critical care time)  Labs Reviewed  COMPREHENSIVE METABOLIC PANEL - Abnormal; Notable for the following:    Glucose, Bld 106 (*)    ALT 38 (*)    All other components within normal limits  URINE RAPID DRUG SCREEN (HOSP PERFORMED) - Abnormal; Notable for the following:    Benzodiazepines POSITIVE (*)    All other components within normal limits  URINALYSIS, ROUTINE W REFLEX MICROSCOPIC - Abnormal; Notable for the following:    APPearance CLOUDY (*)    Leukocytes, UA SMALL (*)    All other components within normal limits  SALICYLATE LEVEL - Abnormal; Notable for the following:    Salicylate Lvl <2.0 (*)    All other components within normal limits  URINE MICROSCOPIC-ADD ON - Abnormal; Notable for the following:    Bacteria, UA FEW (*)    All other components within normal limits  CBC  ETHANOL  ACETAMINOPHEN LEVEL  TSH   No results found.   1. Anxiety   2. Mania   3. Paranoia     MDM  10:54 PM The patient was seen by the patella psychiatrist at this time they're recommending an additional 20 mg of IM Geodon.  They suspect that the Geodon she may be stabilized and ready for discharge.  She will need to be reassessed in the morning        Lyanne Co, MD 12/26/11 [phone]

## 2011-12-27 ENCOUNTER — Emergency Department (HOSPITAL_COMMUNITY)
Admission: EM | Admit: 2011-12-27 | Discharge: 2011-12-28 | Disposition: A | Discharge status: Home / Self Care | Payer: Medicaid Other | Attending: Emergency Medicine | Admitting: Emergency Medicine

## 2011-12-27 DIAGNOSIS — F319 Bipolar disorder, unspecified: Secondary | ICD-10-CM | POA: Insufficient documentation

## 2011-12-27 DIAGNOSIS — R4182 Altered mental status, unspecified: Secondary | ICD-10-CM | POA: Insufficient documentation

## 2011-12-27 DIAGNOSIS — Z79899 Other long term (current) drug therapy: Secondary | ICD-10-CM | POA: Insufficient documentation

## 2011-12-27 DIAGNOSIS — E039 Hypothyroidism, unspecified: Secondary | ICD-10-CM | POA: Insufficient documentation

## 2011-12-27 LAB — RAPID URINE DRUG SCREEN, HOSP PERFORMED
Amphetamines: NOT DETECTED
Cocaine: NOT DETECTED
Opiates: NOT DETECTED
Tetrahydrocannabinol: NOT DETECTED

## 2011-12-27 LAB — COMPREHENSIVE METABOLIC PANEL
ALT: 38 U/L — ABNORMAL HIGH (ref 0–35)
AST: 39 U/L — ABNORMAL HIGH (ref 0–37)
Calcium: 9.9 mg/dL (ref 8.4–10.5)
Sodium: 136 mEq/L (ref 135–145)
Total Protein: 7.6 g/dL (ref 6.0–8.3)

## 2011-12-27 LAB — URINALYSIS, ROUTINE W REFLEX MICROSCOPIC
Bilirubin Urine: NEGATIVE
Hgb urine dipstick: NEGATIVE
Protein, ur: NEGATIVE mg/dL
Urobilinogen, UA: 0.2 mg/dL (ref 0.0–1.0)

## 2011-12-27 LAB — CBC
MCH: 29.4 pg (ref 26.0–34.0)
MCHC: 34.2 g/dL (ref 30.0–36.0)
Platelets: 300 10*3/uL (ref 150–400)
RBC: 4.18 MIL/uL (ref 3.87–5.11)

## 2011-12-27 MED ORDER — ONDANSETRON HCL 4 MG PO TABS
4.0000 mg | ORAL_TABLET | Freq: Three times a day (TID) | ORAL | Status: DC | PRN
  Administered 2011-12-28: 4 mg via ORAL
  Filled 2011-12-27: qty 1

## 2011-12-27 MED ORDER — VITAMIN B-12 100 MCG PO TABS
100.0000 ug | ORAL_TABLET | Freq: Every day | ORAL | Status: DC
  Administered 2011-12-28: 100 ug via ORAL
  Filled 2011-12-27 (×3): qty 1

## 2011-12-27 MED ORDER — LORAZEPAM 1 MG PO TABS
1.0000 mg | ORAL_TABLET | Freq: Three times a day (TID) | ORAL | Status: DC | PRN
  Administered 2011-12-27 – 2011-12-28 (×2): 1 mg via ORAL
  Filled 2011-12-27 (×2): qty 1

## 2011-12-27 MED ORDER — VITAMIN B-12 100 MCG PO TABS: 100.0000 ug | ORAL_TABLET | Freq: Every day | ORAL | Status: DC

## 2011-12-27 MED ORDER — ACETAMINOPHEN 325 MG PO TABS: 650.0000 mg | ORAL_TABLET | ORAL | Status: DC | PRN

## 2011-12-27 MED ORDER — ZOLPIDEM TARTRATE 5 MG PO TABS
5.0000 mg | ORAL_TABLET | Freq: Every evening | ORAL | Status: DC | PRN
  Administered 2011-12-27: 5 mg via ORAL
  Filled 2011-12-27: qty 1

## 2011-12-27 MED ORDER — LEVOTHYROXINE SODIUM 75 MCG PO TABS
75.0000 ug | ORAL_TABLET | Freq: Every day | ORAL | Status: DC
  Administered 2011-12-28: 75 ug via ORAL
  Filled 2011-12-27 (×3): qty 1

## 2011-12-27 MED ORDER — ALUM & MAG HYDROXIDE-SIMETH 200-200-20 MG/5ML PO SUSP: 30.0000 mL | ORAL | Status: DC | PRN

## 2011-12-27 MED ORDER — TRAZODONE HCL 100 MG PO TABS
100.0000 mg | ORAL_TABLET | Freq: Every day | ORAL | Status: DC
  Filled 2011-12-27: qty 1

## 2011-12-27 MED ORDER — IBUPROFEN 600 MG PO TABS: 600.0000 mg | ORAL_TABLET | Freq: Three times a day (TID) | ORAL | Status: DC | PRN

## 2011-12-27 MED ORDER — ZIPRASIDONE HCL 20 MG PO CAPS: 80.0000 mg | ORAL_CAPSULE | Freq: Two times a day (BID) | ORAL | Status: DC

## 2011-12-27 NOTE — ED Notes (Signed)
TC from PA reviewing pt in triage questioning about last disposition of pt. Unable to provide much details, as pt was d/c prior to this shift. Spoke with her [facility] person who received a call earlier today that she was back at home. [facility] is Carmon Sails [phone]). Stated she found pt walking on the street, partially naked, and covered in dirt. Pt stated to her that she had not eaten that day, nor had her medications. Traced back that pt had a telepscyh completed and was d/c to follow up with her community ACT. Ms. Ladona Ridgel stated they were all set up to work with pt, but she needs to be in better condition for them to begin the community support. Located the last telepscyh and will put with the chart. Please note to contact Carmon Sails or the [facility] at [phone] prior to any disposition of pt.

## 2011-12-27 NOTE — Discharge Instructions (Signed)

## 2011-12-27 NOTE — ED Notes (Signed)
Pt with increased agitation and anxiety. Called house supervisor to get a sitter for pt. Stated he would work on getting pt a Comptroller.

## 2011-12-27 NOTE — ED Notes (Addendum)
Pt changed into gown and red socks in presence of this RN, pt's clothing placed in bag, labeled and locked in locker #31.  Pt given meal tray.  Pt denies any needs or requests at this time. Pt in view of nsg station, door closed with blinds open.

## 2011-12-27 NOTE — ED Notes (Signed)
Pt here w/a mental health clinician who happened to go by pt's house for an assessment for  ACTT  Program placement.   Upon her arrival, she noted the pt lifting her dress, pacing, had been walking partially naked outside.  Pt lives alone.  Had been recently inpatient [facility] and d/c'd last night.   Pt is fidgety and wants to go home.  Unable to answer questions appropriately.  Vomited on her way here.  Pt admitted to not having eaten or taking her meds today.

## 2011-12-27 NOTE — BH Assessment (Signed)
Assessment Note   Rebekah Pace is a 56 y.o. female who presents to [facility]led with psychosis.  Pt was d/c this am after telepsych determined that she was to return home.  Pt returns after her act team found her partially naked and covered in dirt.  Pt has not eaten in two days and nor has she taken meds in 2 days, pt unable to tell this writer why she has not taken meds in 2 days.  During assessment, pt is disorganized in thoughts, has thought blocking and unable to articulate thoughts.  Pt is snapping fingers, swinging legs in a restless motion and hands are tremulous.  Pt reports to this writer that she has racing thoughts and bouts of crying while being assessed.  Pt told this writer--"I made a promise to GOD that if I'm not out of the hospital by sunset, then HE doesn't exist anymore.  "please don't make me stay". Info sent to [facility] to review for inpt admission.  Pt denies SI/HI.  Axis I: Psychotic Disorder NOS Axis II: Deferred Axis III:  Past Medical History  Diagnosis Date  . Thyroid disease   . Anxiety   . Mental disorder   . Depression   . Hepatitis   . Hypothyroidism    Axis IV: other psychosocial or environmental problems, problems related to social environment and problems with primary support group Axis V: 21-30 behavior considerably influenced by delusions or hallucinations OR serious impairment in judgment, communication OR inability to function in almost all areas  Past Medical History:  Past Medical History  Diagnosis Date  . Thyroid disease   . Anxiety   . Mental disorder   . Depression   . Hepatitis   . Hypothyroidism     Past Surgical History  Procedure Date  . Arm fracture   . Rotator cuff repair     Family History: No family history on file.  Social History:  reports that she has never smoked. She has never used smokeless tobacco. She reports that she drinks alcohol. She reports that she does not use illicit drugs.  Additional Social History:  Alcohol / Drug  Use Pain Medications: None  Prescriptions: None  Over the Counter: None  History of alcohol / drug use?: No history of alcohol / drug abuse Longest period of sobriety (when/how long): None  Allergies:  Allergies  Allergen Reactions  . Sulfa Antibiotics Rash    Home Medications:  Medications Prior to Admission  Medication Dose Route Frequency Provider Last Rate Last Dose  . acetaminophen (TYLENOL) tablet 650 mg  650 mg Oral Q4H PRN Lisette Paz, PA-C      . alum & mag hydroxide-simeth (MAALOX/MYLANTA) 200-200-20 MG/5ML suspension 30 mL  30 mL Oral PRN Lisette Paz, PA-C      . ibuprofen (ADVIL,MOTRIN) tablet 600 mg  600 mg Oral Q8H PRN Lisette Paz, PA-C      . LORazepam (ATIVAN) injection 1 mg  1 mg Intravenous Once Forbes Cellar, MD   1 mg at 12/26/11 0903  . LORazepam (ATIVAN) tablet 1 mg  1 mg Oral Q8H PRN Lisette Paz, PA-C   1 mg at 12/27/11 2047  . ondansetron (ZOFRAN) tablet 4 mg  4 mg Oral Q8H PRN Lisette Paz, PA-C      . sodium chloride 0.9 % bolus 1,000 mL  1,000 mL Intravenous Once Forbes Cellar, MD   1,000 mL at 12/26/11 0902  . ziprasidone (GEODON) injection 10 mg  10 mg Intramuscular Once Forbes Cellar,  MD   10 mg at 12/26/11 1215  . ziprasidone (GEODON) injection 20 mg  20 mg Intramuscular Once Lyanne Co, MD   20 mg at 12/26/11 2306  . zolpidem (AMBIEN) tablet 5 mg  5 mg Oral QHS PRN Jaci Carrel, PA-C   5 mg at 12/27/11 2047  . DISCONTD: acetaminophen (TYLENOL) tablet 650 mg  650 mg Oral Q4H PRN Forbes Cellar, MD      . DISCONTD: alum & mag hydroxide-simeth (MAALOX/MYLANTA) 200-200-20 MG/5ML suspension 30 mL  30 mL Oral PRN Forbes Cellar, MD      . DISCONTD: ibuprofen (ADVIL,MOTRIN) tablet 600 mg  600 mg Oral Q8H PRN Forbes Cellar, MD      . DISCONTD: levothyroxine (SYNTHROID, LEVOTHROID) tablet 75 mcg  75 mcg Oral QAC breakfast Forbes Cellar, MD      . DISCONTD: LORazepam (ATIVAN) tablet 1 mg  1 mg Oral Q8H PRN Forbes Cellar, MD      . DISCONTD: nicotine  (NICODERM CQ - dosed in mg/24 hours) patch 21 mg  21 mg Transdermal Daily Forbes Cellar, MD      . DISCONTD: ondansetron (ZOFRAN) tablet 4 mg  4 mg Oral Q8H PRN Forbes Cellar, MD      . DISCONTD: traZODone (DESYREL) tablet 100 mg  100 mg Oral QHS PRN Forbes Cellar, MD      . DISCONTD: ziprasidone (GEODON) capsule 80 mg  80 mg Oral BID WC Forbes Cellar, MD   80 mg at 12/26/11 1758   Medications Prior to Admission  Medication Sig Dispense Refill  . cyanocobalamin 100 MCG tablet Take 100 mcg by mouth daily. B12 supplement forgot to put it in the bag, got last in the hosp      . levothyroxine (SYNTHROID, LEVOTHROID) 75 MCG tablet Take 1 tablet (75 mcg total) by mouth daily. Thyroid supplement.  30 tablet  0  . traZODone (DESYREL) 100 MG tablet Take 100 mg by mouth at bedtime. For sleep.      . ziprasidone (GEODON) 80 MG capsule Take 80 mg by mouth 2 (two) times daily with a meal. For mood stability and clear thoughts.        OB/GYN Status:  No LMP recorded. Patient is postmenopausal.  General Assessment Data Location of Assessment: [facility] Living Arrangements: Alone Can pt return to current living arrangement?: Yes Admission Status: Involuntary Is patient capable of signing voluntary admission?: No Transfer from: Acute Hospital Referral Source: MD  Education Status Is patient currently in school?: No Current Grade: None  Highest grade of school patient has completed: Unk  Name of school: [employer]  Contact person: None   Risk to self Suicidal Ideation: No Suicidal Intent: No Is patient at risk for suicide?: No Suicidal Plan?: No Access to Means: No What has been your use of drugs/alcohol within the last 12 months?: Pt denies  Previous Attempts/Gestures: No How many times?: 0  Other Self Harm Risks: None Triggers for Past Attempts: None known Intentional Self Injurious Behavior: None Family Suicide History: No Recent stressful life event(s): Other (Comment) (No meds x2 days  ) Persecutory voices/beliefs?: No Depression: No Depression Symptoms: Feeling angry/irritable Substance abuse history and/or treatment for substance abuse?: No Suicide prevention information given to non-admitted patients: Not applicable  Risk to Others Homicidal Ideation: No Thoughts of Harm to Others: No Current Homicidal Intent: No Current Homicidal Plan: No Access to Homicidal Means: No Identified Victim: None  History of harm to others?: No Assessment of Violence: None Noted Violent  Behavior Description: None  Does patient have access to weapons?: No Criminal Charges Pending?: No Describe Pending Criminal Charges: None  Does patient have a court date: No Court Date:  (None )  Psychosis Hallucinations: None noted Delusions: None noted  Mental Status Report Appear/Hygiene: Disheveled Eye Contact: Good Motor Activity: Tremors Speech: Incoherent;Pressured Level of Consciousness: Alert;Crying;Restless Mood: Irritable;Anxious Affect: Inconsistent with thought content;Anxious Anxiety Level: Moderate Thought Processes: Flight of Ideas Judgement: Impaired Orientation: Person;Place;Time;Situation Obsessive Compulsive Thoughts/Behaviors: None  Cognitive Functioning Concentration: Decreased Memory: Recent Intact;Remote Intact IQ: Average Insight: Poor Impulse Control: Poor Appetite: Fair Weight Loss: 0  Weight Gain: 0  Sleep: Decreased Total Hours of Sleep: 4  Vegetative Symptoms: None  Prior Inpatient Therapy Prior Inpatient Therapy: Yes Prior Therapy Dates: 2012 Prior Therapy Facilty/Provider(s): [facility] Reason for Treatment: Psychosis   Prior Outpatient Therapy Prior Outpatient Therapy: Yes Prior Therapy Dates: Current  Prior Therapy Facilty/Provider(s): [facility]  Reason for Treatment: Med Mgt, Therapy   ADL Screening (condition at time of admission) Patient's cognitive ability adequate to safely complete daily activities?: Yes Patient able  to express need for assistance with ADLs?: Yes Independently performs ADLs?: Yes Weakness of Legs: None Weakness of Arms/Hands: None       Abuse/Neglect Assessment (Assessment to be complete while patient is alone) Physical Abuse: Denies Verbal Abuse: Denies Sexual Abuse: Denies Exploitation of patient/patient's resources: Denies Self-Neglect: Denies Values / Beliefs Cultural Requests During Hospitalization: None Spiritual Requests During Hospitalization: None Consults Spiritual Care Consult Needed: No Social Work Consult Needed: No Merchant navy officer (For Healthcare) Advance Directive: Patient does not have advance directive;Patient would not like information Pre-existing out of facility DNR order (yellow form or pink MOST form): No Nutrition Screen Problems chewing or swallowing foods and/or liquids: No Patient appears severely malnourished: No  Additional Information 1:1 In Past 12 Months?: No CIRT Risk: No Elopement Risk: No Does patient have medical clearance?: Yes     Disposition:  Disposition Disposition of Patient: Inpatient treatment program;Referred to [facility] ) Type of inpatient treatment program: Adult Patient referred to: Other (Comment) [facility] )  On Site Evaluation by:   Reviewed with Physician:     Murrell Redden 12/27/2011 9:19 PM

## 2011-12-27 NOTE — ED Provider Notes (Signed)
History     CSN: [id number]  Arrival date & time 12/27/11  1646   First MD Initiated Contact with Patient 12/27/11 1746      Chief Complaint  Patient presents with  . Altered Mental Status    (Consider location/radiation/quality/duration/timing/severity/associated sxs/prior treatment) HPI Comments: Patient with history of schizoaffective and bipolar disorder presents emergency department with an ACT counselor that states she found the patient at home lifting her dress and walking around naked outside.  It appears that the patient has not been taking her medications.  Patient was seen and evaluated in this emergency department yesterday.  The last note that I reviewed in the computer states the patient was going to be admitted to behavioral health, however I cannot find a telemetry site or a plan for discharge.  Patient lives alone and is a current flight risk.  She denies suicidal homicidal ideations.  Patient does not have any current medical complaints.   Patient is a 56 y.o. female presenting with altered mental status. The history is provided by the patient, a caregiver and medical records.  Altered Mental Status    Past Medical History  Diagnosis Date  . Thyroid disease   . Anxiety   . Mental disorder   . Depression   . Hepatitis   . Hypothyroidism     Past Surgical History  Procedure Date  . Arm fracture   . Rotator cuff repair     No family history on file.  History  Substance Use Topics  . Smoking status: Never Smoker   . Smokeless tobacco: Never Used  . Alcohol Use: Yes    OB History    Grav Para Term Preterm Abortions TAB SAB Ect Mult Living                  Review of Systems  Psychiatric/Behavioral: Positive for altered mental status.  All other systems reviewed and are negative.    Allergies  Sulfa antibiotics  Home Medications   Current Outpatient Rx  Name Route Sig Dispense Refill  . CYANOCOBALAMIN 100 MCG PO TABS Oral Take 100 mcg by  mouth daily. B12 supplement forgot to put it in the bag, got last in the hosp    . LEVOTHYROXINE SODIUM 75 MCG PO TABS Oral Take 1 tablet (75 mcg total) by mouth daily. Thyroid supplement. 30 tablet 0  . TRAZODONE HCL 100 MG PO TABS Oral Take 100 mg by mouth at bedtime. For sleep.    Marland Kitchen ZIPRASIDONE HCL 80 MG PO CAPS Oral Take 80 mg by mouth 2 (two) times daily with a meal. For mood stability and clear thoughts.      BP 143/78  Pulse 99  Temp(Src) 98.6 F (37 C) (Oral)  SpO2 100%  Physical Exam  Constitutional: She is oriented to person, place, and time. She appears well-developed and well-nourished. No distress.  HENT:  Head: Normocephalic and atraumatic.  Mouth/Throat: Oropharynx is clear and moist. No oropharyngeal exudate.  Eyes: Conjunctivae and EOM are normal. Pupils are equal, round, and reactive to light. No scleral icterus.  Neck: Normal range of motion. Neck supple. No tracheal deviation present. No thyromegaly present.  Cardiovascular: Normal rate, regular rhythm, normal heart sounds and intact distal pulses.   Pulmonary/Chest: Effort normal and breath sounds normal. No stridor. No respiratory distress. She has no wheezes.  Abdominal: Soft.  Musculoskeletal: Normal range of motion. She exhibits no edema and no tenderness.  Neurological: She is alert and oriented to person, place,  and time. Coordination normal.  Skin: Skin is warm and dry. No rash noted. She is not diaphoretic. No erythema. No pallor.  Psychiatric: She has a normal mood and affect. Her behavior is normal.    ED Course  Procedures (including critical care time)  Labs Reviewed - No data to display No results found.   No diagnosis found.  Missy Ladona Ridgel of therapeutic center at [facility] ACT crisis line [phone] asks to be contacted when pt is to be released. (pts case manager)    MDM  ACT team consulted. Pt has been medically cleared to move to [facility] via PE, labs still pending. It is my recommendation that this pt  needs to be admitted to [facility]. Holding orders placed and IVC papers will be drawn if pt becomes non compliant with admit.         Jaci Carrel, [place] 12/27/11 2349

## 2011-12-28 MED ORDER — TRAZODONE HCL 100 MG PO TABS
100.0000 mg | ORAL_TABLET | Freq: Once | ORAL | Status: AC
  Administered 2011-12-28: 100 mg via ORAL

## 2011-12-28 MED ORDER — TRAZODONE HCL 100 MG PO TABS: 100.0000 mg | ORAL_TABLET | Freq: Every day | ORAL | Status: DC

## 2011-12-28 MED ORDER — ZIPRASIDONE HCL 20 MG PO CAPS
80.0000 mg | ORAL_CAPSULE | Freq: Two times a day (BID) | ORAL | Status: DC
  Administered 2011-12-28: 80 mg via ORAL
  Filled 2011-12-28: qty 4

## 2011-12-28 MED ORDER — ZIPRASIDONE HCL 80 MG PO CAPS: 80.0000 mg | ORAL_CAPSULE | Freq: Two times a day (BID) | ORAL | Status: DC

## 2011-12-28 NOTE — ED Notes (Signed)
Pt's mother called and pt has given written permission to release information.  Mother informed that the she has been started back on  Her medication and is agreeing to continue them at home.  Pt reports that her community ACT team is going to be coming in 2x weekly to follow her medication compliance-mom is aware.

## 2011-12-28 NOTE — ED Notes (Signed)
Up to the bathroom, asking about disposition

## 2011-12-28 NOTE — ED Provider Notes (Signed)
Patient seen and examined this morning. Patient states she would like to go home. She states she would also like to have something for anxiety.  Filed Vitals:   12/28/11 0611  BP: 128/73  Pulse: 106  Temp: 98.6 F (37 C)  Resp: 20   Heart regular rate and rhythm. Lungs clear to auscultation. Mental status patient is calm and cooperative. We will do a  psychiatry consult to see patient is stable for discharge today.  Celene Kras, MD 12/28/11 [phone]

## 2011-12-28 NOTE — ED Notes (Signed)
Dr knapp into see 

## 2011-12-28 NOTE — ED Notes (Signed)
Up to the bathroom 

## 2011-12-28 NOTE — ED Notes (Signed)
SPOC information faxed

## 2011-12-28 NOTE — Discharge Instructions (Signed)
Manic Depression (Bipolar Disorder)  Bipolar disorder is also known as manic depressive illness. It is when the brain does not function properly and causes shifts in a person's moods, energy and ability to function in everyday life. These shifts are different from the normal ups and downs that everyone experiences. Instead the shifts are severe. If this goes untreated, the person's life becomes more and more disorderly. People with this disorder can be treated can lead full and productive lives. This disorder must be managed throughout life.   SYMPTOMS    Bipolar disorder causes dramatic mood swings. These mood swings go in cycles. They cycle from extreme "highs" and irritable to deep "lows" of sadness and hopelessness.   Between the extreme moods, there are usually periods of normal mood.   Along with the mood shifts, the person will have severe changes in energy and behavior. The periods of "highs" and "lows" are called episodes of mania and depression.  Signs of mania:   Lots of energy, activity and restlessness.   Extreme "high" or good mood.   Extreme irritability.   Racing thoughts and talking very fast.   Jumping from one idea to another.   Not able to focus, easily distracted.   Little need to sleep.   Grand beliefs in one's abilities and powers.   Spending sprees.   Increased sexual drive. This can result in many sexual partners.   Poor judgment.   Abuse of drugs, particularly cocaine, alcohol, and sleeping medication.   Aggressive or provocative behavior.   A lasting period of behavior that is different from usual.   Denial that anything is wrong.  *A manic episode is identified if a "high" mood happens with three or more of the other symptoms lasting most of the day, nearly everyday for a week or longer. If the mood is more irritable in nature, four additional symptoms must be present.  Signs of depression:   Lasting feelings of sadness, anxiety, or empty mood.   Feelings of  hopelessness with negative thoughts.   Feelings of guilt, worthlessness, or helplessness.   Loss of interest or pleasure in activities once enjoyed, including sex.   Feelings of fatigue or having less energy.   Trouble focusing, making decisions, remembering.   Feeling restless or irritable.   Sleeping too little or too much.   Change in eating with possible weight gain or loss.   Feeling ongoing pain that is not caused by physical illness or injury.   Thoughts of death or suicide or suicide attempts.  *A depressive episode is identified as having five or more of the above symptoms that last most of the day, nearly everyday for two weeks or longer.  CAUSES    Research shows that there is no single cause for the disorder. Many factors act together to produce the illness.   This can be passed down from family (hereditary).   Environment may play a part.  TREATMENT    Long-term treatment is strongly recommended because bipolar disorder is a repeated illness. This disorder is better controlled if treatment is ongoing than if it is off and on.   A combination of medication and talk therapy is best for managing the disorder over time.   Medication.   Medication can be prescribed by a doctor that is an expert in treating mental disorders (psychiatrists). Medications known as "mood stabilizers" are usually prescribed to help control the illness. Other medications can be added when needed. These medicines usually treat episodes   of mania or depression that break through despite the mood stabilizer.   Talk Therapy.   Along with medication, some forms of talk therapy are helpful in providing support, education and guidance to people with the illness and their families. Studies show that this type of treatment increases mood stability, decreases need for hospitalization and improves how they function society.   Electroconvulsive Therapy (ECT).   In extreme situations where the above treatments do not work or  work too slowly to relieve severe symptoms, ECT may be considered.  Document Released: 01/06/2001 Document Revised: 09/19/2011 Document Reviewed: 08/28/2007  [employer] Patient Information 2012 [employer].

## 2011-12-28 NOTE — ED Notes (Signed)
Up to the desk on the phone 

## 2011-12-28 NOTE — BHH Counselor (Signed)
Pt brother, Janeli Lewison - [phone] called to express concerns about his sisters ability to advocate for herself. Pt supposedly has an ACT TEAM med case worker that comes out every two weeks.  Pt has long hx of mental health issues.  Pt lives by herself in [place].  Pt has hx of taking clothes off and being belligerent when not on meds.  Pt brother just concerned that if pt is discharged and she isn't on her meds that she will be back in ED.  Case coordination needs to take place through a social work consult with her current case worker to insure continuity of care if pt is deemed appropriate for discharge via Tele Psych.

## 2011-12-28 NOTE — ED Provider Notes (Signed)
Medical screening examination/treatment/procedure(s) were performed by non-physician practitioner and as supervising physician I was immediately available for consultation/collaboration.   Jaquis Picklesimer, MD 12/28/11 2128 

## 2011-12-28 NOTE — ED Notes (Signed)
telepsych in progress 

## 2011-12-28 NOTE — BH Assessment (Signed)
Assessment Note   [name] is a 56 y.o. female who presents to [facility] with psychosis. Pt was d/c this am after telepsych determined that she was to return home. Pt returns after her act team found her partially naked and covered in dirt. Pt has not eaten in two days and nor has she taken meds in 2 days, pt unable to tell this writer why she has not taken meds in 2 days. During assessment, pt is disorganized in thoughts, has thought blocking and unable to articulate thoughts. Pt is snapping fingers, swinging legs in a restless motion and hands are tremulous. Pt reports to this writer that she has racing thoughts and bouts of crying while being assessed. Pt told this writer--"I made a promise to GOD that if I'm not out of the hospital by sunset, then HE doesn't exist anymore. "please don't make me stay".  Consulted with Franchot Gallo, MD who was familiar with Pt's history and current presentation. He declined Pt admission to [facility] stating Pt has maximized therapeutic benefit at this facility and recommended placement at a different psychiatric facility.  Patsy Baltimore, Harlin Rain 12/28/2011 12:08 AM

## 2011-12-28 NOTE — ED Notes (Signed)
Pt's belongings returned, written dc instructions reviewed w/ pt.  Pt verbalized understanding.  Pt encourqaged to stay on her medications as instructed, follow up w/ her ACT team and MD, and to return for difficulties.

## 2011-12-30 ENCOUNTER — Encounter (HOSPITAL_COMMUNITY): Payer: Self-pay | Admitting: *Deleted

## 2011-12-30 ENCOUNTER — Emergency Department (HOSPITAL_COMMUNITY)
Admission: EM | Admit: 2011-12-30 | Discharge: 2011-12-31 | Disposition: A | Discharge status: Home / Self Care | Payer: Medicaid Other | Attending: Emergency Medicine | Admitting: Emergency Medicine

## 2011-12-30 DIAGNOSIS — E039 Hypothyroidism, unspecified: Secondary | ICD-10-CM | POA: Insufficient documentation

## 2011-12-30 DIAGNOSIS — IMO0002 Reserved for concepts with insufficient information to code with codable children: Secondary | ICD-10-CM | POA: Insufficient documentation

## 2011-12-30 DIAGNOSIS — F341 Dysthymic disorder: Secondary | ICD-10-CM | POA: Insufficient documentation

## 2011-12-30 DIAGNOSIS — Z79899 Other long term (current) drug therapy: Secondary | ICD-10-CM | POA: Insufficient documentation

## 2011-12-30 DIAGNOSIS — F259 Schizoaffective disorder, unspecified: Secondary | ICD-10-CM | POA: Insufficient documentation

## 2011-12-30 DIAGNOSIS — F250 Schizoaffective disorder, bipolar type: Secondary | ICD-10-CM

## 2011-12-30 DIAGNOSIS — Z8619 Personal history of other infectious and parasitic diseases: Secondary | ICD-10-CM | POA: Insufficient documentation

## 2011-12-30 DIAGNOSIS — R000 Tachycardia, unspecified: Secondary | ICD-10-CM | POA: Insufficient documentation

## 2011-12-30 LAB — URINE MICROSCOPIC-ADD ON

## 2011-12-30 LAB — COMPREHENSIVE METABOLIC PANEL
ALT: 33 U/L (ref 0–35)
AST: 31 U/L (ref 0–37)
CO2: 27 mEq/L (ref 19–32)
Calcium: 10.1 mg/dL (ref 8.4–10.5)
Chloride: 98 mEq/L (ref 96–112)
GFR calc Af Amer: >90 mL/min (ref >90)
GFR calc non Af Amer: >90 mL/min (ref >90)
Glucose, Bld: 168 mg/dL — ABNORMAL HIGH (ref 70–99)
Sodium: 135 mEq/L (ref 135–145)
Total Bilirubin: 0.6 mg/dL (ref 0.3–1.2)

## 2011-12-30 LAB — URINALYSIS, ROUTINE W REFLEX MICROSCOPIC
Bilirubin Urine: NEGATIVE
Glucose, UA: NEGATIVE mg/dL
Ketones, ur: NEGATIVE mg/dL
Specific Gravity, Urine: 1.013 (ref 1.005–1.030)
pH: 6 (ref 5.0–8.0)

## 2011-12-30 LAB — CBC
Hemoglobin: 13.9 g/dL (ref 12.0–15.0)
MCH: 29.4 pg (ref 26.0–34.0)
MCV: 86.9 fL (ref 78.0–100.0)
RBC: 4.73 MIL/uL (ref 3.87–5.11)
WBC: 8.5 10*3/uL (ref 4.0–10.5)

## 2011-12-30 LAB — RAPID URINE DRUG SCREEN, HOSP PERFORMED
Barbiturates: NOT DETECTED
Cocaine: NOT DETECTED
Tetrahydrocannabinol: NOT DETECTED

## 2011-12-30 MED ORDER — ACETAMINOPHEN 325 MG PO TABS: 650.0000 mg | ORAL_TABLET | ORAL | Status: DC | PRN

## 2011-12-30 MED ORDER — ALUM & MAG HYDROXIDE-SIMETH 200-200-20 MG/5ML PO SUSP: 30.0000 mL | ORAL | Status: DC | PRN

## 2011-12-30 MED ORDER — NICOTINE 21 MG/24HR TD PT24
21.0000 mg | MEDICATED_PATCH | Freq: Every day | TRANSDERMAL | Status: DC
  Filled 2011-12-30: qty 1

## 2011-12-30 MED ORDER — IBUPROFEN 600 MG PO TABS
600.0000 mg | ORAL_TABLET | Freq: Three times a day (TID) | ORAL | Status: DC | PRN
  Administered 2011-12-30: 600 mg via ORAL
  Filled 2011-12-30: qty 1

## 2011-12-30 MED ORDER — ONDANSETRON HCL 4 MG PO TABS: 4.0000 mg | ORAL_TABLET | Freq: Three times a day (TID) | ORAL | Status: DC | PRN

## 2011-12-30 MED ORDER — LORAZEPAM 1 MG PO TABS
1.0000 mg | ORAL_TABLET | Freq: Three times a day (TID) | ORAL | Status: DC | PRN
  Administered 2011-12-30 – 2011-12-31 (×2): 1 mg via ORAL
  Filled 2011-12-30 (×3): qty 1

## 2011-12-30 NOTE — ED Notes (Signed)
CSW spoke with Wynona Canes from [facility] [phone]), who confirmed that she would come to the hospital on 12/31/11 at 11am in order to complete pt's intake forms and evaluation for outpatient mental health services. Pt is pending discharge after that appointment is completed. CSW/ACT to continue following pt.

## 2011-12-30 NOTE — ED Notes (Signed)
Pt's  Mother reports that she is having a hard time to reason with pt. Was just seen here recently for same but was discharged home.  Pt states "i dont need to be here."

## 2011-12-30 NOTE — BH Assessment (Signed)
Assessment Note   Rebekah Pace is an 56 y.o. female Patient here with increased agitation. Per ED notes, well-known to this hospital multiple visits for same. History of bipolar disorder and medication noncompliance. Seen here 2 days ago for same symptoms, evaluated by tele psychiatry, and discharged home after the telepsych psychiatrist deemed patient stable enough for discharge. Patient has her own ACT Team with [facility] but has not followed up with that yet due to frequent ED visits.  She denies homicidal or suicidal ideations. No auditory or visual hallucinations. Per pt's mother she was brought in due to increased agitation. Patient sts, "I am only here because my mother is verbally abusive to me". Patient will not provide any further explanations as to why she is here today. Patient asking to be discharged home.    Discussed patients level of care with Dr. Freida Busman. We both agreed that patient needs appropriate follow-up so that she does not continue to return to the ED repeatedly. Writer contacted [facility] and spoke to [employer] [facility]. He is a Clinical biochemist with [facility] and they are willing to  provide ACT Team Services to patient. However, per Kourt their staff has been unable to arrange a meeting time with patient to complete her intake.  The intake and PCP must be completed prior to their company providing services to patient. Writer asked if their staff would be willing to come in the ED and complete the intake so that patients services are started immediately. Their staff agreed to meet with patient tommorow 12/31/2011 after 10am.  Pending discharge plan as of 12/30/2011 @ 1846: Pt to be discharged home pending completion of a intake with [facility]. Their staff is expected and schedule to meet with patient 12/31/2010 after 10 am.  Axis I: Bipolar Disorder (Manic State) Axis II: Deferred Axis III:  Past Medical History  Diagnosis Date  . Thyroid disease   . Anxiety   . Mental disorder   . Depression   .  Hepatitis   . Hypothyroidism    Axis IV: economic problems, other psychosocial or environmental problems, problems related to social environment, problems with access to health care services and problems with primary support group Axis V: 41-50 serious symptoms  Past Medical History:  Past Medical History  Diagnosis Date  . Thyroid disease   . Anxiety   . Mental disorder   . Depression   . Hepatitis   . Hypothyroidism     Past Surgical History  Procedure Date  . Arm fracture   . Rotator cuff repair     Family History: No family history on file.  Social History:  reports that she has never smoked. She has never used smokeless tobacco. She reports that she drinks alcohol. She reports that she does not use illicit drugs.  Additional Social History:  Alcohol / Drug Use Pain Medications: See listed meds noted by [facility] staff Prescriptions: See listed meds noted by [facility] staff Over the Counter: See listed meds noted by [facility] staff History of alcohol / drug use?: No history of alcohol / drug abuse Longest period of sobriety (when/how long): n/a Allergies:  Allergies  Allergen Reactions  . Sulfa Antibiotics Rash    Home Medications:  Medications Prior to Admission  Medication Dose Route Frequency Provider Last Rate Last Dose  . acetaminophen (TYLENOL) tablet 650 mg  650 mg Oral Q4H PRN Toy Baker, MD      . alum & mag hydroxide-simeth (MAALOX/MYLANTA) 200-200-20 MG/5ML suspension 30 mL  30 mL Oral PRN Toy Baker, MD      . ibuprofen (ADVIL,MOTRIN) tablet 600 mg  600 mg Oral Q8H PRN Toy Baker, MD      . LORazepam (ATIVAN) tablet 1 mg  1 mg Oral Q8H PRN Toy Baker, MD   1 mg at 12/30/11 1815  . nicotine (NICODERM CQ - dosed in mg/24 hours) patch 21 mg  21 mg Transdermal Daily Toy Baker, MD      . ondansetron [facility]) tablet 4 mg  4 mg Oral Q8H PRN Toy Baker, MD       Medications Prior to Admission  Medication Sig Dispense Refill  . cyanocobalamin  100 MCG tablet Take 100 mcg by mouth daily. B12 supplement forgot to put it in the bag, got last in the hosp      . levothyroxine (SYNTHROID, LEVOTHROID) 75 MCG tablet Take 1 tablet (75 mcg total) by mouth daily. Thyroid supplement.  30 tablet  0  . traZODone (DESYREL) 100 MG tablet Take 1 tablet (100 mg total) by mouth at bedtime. For sleep.  30 tablet  0  . ziprasidone (GEODON) 80 MG capsule Take 1 capsule (80 mg total) by mouth 2 (two) times daily with a meal.  60 capsule  0  . ziprasidone (GEODON) 80 MG capsule Take 80 mg by mouth 2 (two) times daily with a meal. For mood stability and clear thoughts.        OB/GYN Status:  No LMP recorded. Patient is postmenopausal.  General Assessment Data Location of Assessment: [facility] ED ACT Assessment: Yes Living Arrangements: Alone Can pt return to current living arrangement?: Yes Admission Status: Involuntary Is patient capable of signing voluntary admission?: No Transfer from: Acute Hospital Referral Source: Self/Family/Friend (pt brought to the ED by her mother)  Education Status Is patient currently in school?: No  Risk to self Suicidal Ideation: No Suicidal Intent: No Is patient at risk for suicide?: No Suicidal Plan?: No Access to Means: No What has been your use of drugs/alcohol within the last 12 months?:  (pt denies drug use) Previous Attempts/Gestures: No How many times?:  (0) Other Self Harm Risks:  (none reported) Triggers for Past Attempts: None known Intentional Self Injurious Behavior: None Family Suicide History: No Recent stressful life event(s):  (sts her mother is verbally abusive toward her; off meds 3day) Persecutory voices/beliefs?: No Depression: No Substance abuse history and/or treatment for substance abuse?: No Suicide prevention information given to non-admitted patients: Not applicable  Risk to Others Homicidal Ideation: No Thoughts of Harm to Others: No Current Homicidal Intent: No Current Homicidal  Plan: No Access to Homicidal Means: No Identified Victim:  (None Reported) History of harm to others?: No Assessment of Violence: None Noted Violent Behavior Description:  (None Reported) Does patient have access to weapons?: No Describe Pending Criminal Charges:  (None Reported) Does patient have a court date: No  Psychosis Hallucinations: None noted Delusions: None noted  Mental Status Report Appear/Hygiene: Disheveled Eye Contact: Good Motor Activity: Tremors Speech: Incoherent;Pressured Level of Consciousness: Alert;Crying;Restless Mood: Irritable Affect: Inconsistent with thought content;Anxious Anxiety Level: Moderate Thought Processes: Flight of Ideas Judgement: Impaired Orientation: Person;Place;Time;Situation Obsessive Compulsive Thoughts/Behaviors: None  Cognitive Functioning Concentration: Decreased Memory: Recent Intact;Remote Intact IQ: Average Insight: Poor Impulse Control: Poor Appetite: Fair Weight Loss:  (0) Weight Gain:  (0) Sleep: Decreased Total Hours of Sleep:  (4) Vegetative Symptoms: None  Prior Inpatient Therapy Prior Inpatient Therapy: Yes Prior Therapy Dates: 2012 Prior Therapy  Facilty/Provider(s): [facility] Reason for Treatment: Psychosis   Prior Outpatient Therapy Prior Outpatient Therapy: Yes Prior Therapy Dates: Current  Prior Therapy Facilty/Provider(s): [facility]  Reason for Treatment: Med Mgt, Therapy   ADL Screening (condition at time of admission) Patient's cognitive ability adequate to safely complete daily activities?: Yes Patient able to express need for assistance with ADLs?: Yes Independently performs ADLs?: Yes Weakness of Legs: None Weakness of Arms/Hands: None  Home Assistive Devices/Equipment Home Assistive Devices/Equipment: None    Abuse/Neglect Assessment (Assessment to be complete while patient is alone) Physical Abuse: Denies Verbal Abuse: Denies Sexual Abuse: Denies Exploitation of  patient/patient's resources: Denies Self-Neglect: Denies Values / Beliefs Cultural Requests During Hospitalization: None Spiritual Requests During Hospitalization: None   Advance Directives (For Healthcare) Advance Directive: Patient does not have advance directive Nutrition Screen Diet: Regular Unintentional weight loss greater than 10lbs within the last month: No Problems chewing or swallowing foods and/or liquids: No Home Tube Feeding or Total Parenteral Nutrition (TPN): No Patient appears severely malnourished: No Pregnant or Lactating: No  Additional Information 1:1 In Past 12 Months?: No CIRT Risk: No Elopement Risk: No Does patient have medical clearance?: Yes     Disposition:  Disposition Disposition of Patient: Other dispositions (out-pt referrals will be provided) Other disposition(s): To current provider;Other (Comment) ([facility]) Patient referred to: Other (Comment) (Pending d/c home after completing intake w/ [facility])  On Site Evaluation by:   Reviewed with Physician:     Melynda Ripple [facility] 12/30/2011 6:25 PM

## 2011-12-30 NOTE — ED Notes (Signed)
Pt back up to nsg station wanting to make phone calls.Reminded of the phone policy.

## 2011-12-30 NOTE — ED Provider Notes (Signed)
History     CSN: [id number]  Arrival date & time 12/30/11  1240   First MD Initiated Contact with Patient 12/30/11 1348      Chief Complaint  Patient presents with  . Medical Clearance    (Consider location/radiation/quality/duration/timing/severity/associated sxs/prior treatment) The history is provided by the patient.   Patient here with increased agitation. Well-known to this hospital multiple visits for same. History of bipolar disorder and medication noncompliance. Seen here 2 days ago for same symptoms was evaluated by tele psychiatry and was felt to be stable for discharge. Patient is to have her own act team but has not followed up with that yet. She denies homicidal or suicidal ideations. No auditory or visual hallucinations. Was brought in by her mother do to increased agitation. Past Medical History  Diagnosis Date  . Thyroid disease   . Anxiety   . Mental disorder   . Depression   . Hepatitis   . Hypothyroidism     Past Surgical History  Procedure Date  . Arm fracture   . Rotator cuff repair     No family history on file.  History  Substance Use Topics  . Smoking status: Never Smoker   . Smokeless tobacco: Never Used  . Alcohol Use: Yes    OB History    Grav Para Term Preterm Abortions TAB SAB Ect Mult Living                  Review of Systems  All other systems reviewed and are negative.    Allergies  Sulfa antibiotics  Home Medications   Current Outpatient Rx  Name Route Sig Dispense Refill  . CYANOCOBALAMIN 100 MCG PO TABS Oral Take 100 mcg by mouth daily. B12 supplement forgot to put it in the bag, got last in the hosp    . LEVOTHYROXINE SODIUM 75 MCG PO TABS Oral Take 1 tablet (75 mcg total) by mouth daily. Thyroid supplement. 30 tablet 0  . TRAZODONE HCL 100 MG PO TABS Oral Take 1 tablet (100 mg total) by mouth at bedtime. For sleep. 30 tablet 0  . ZIPRASIDONE HCL 80 MG PO CAPS Oral Take 80 mg by mouth 2 (two) times daily with a meal.  For mood stability and clear thoughts.    Marland Kitchen ZIPRASIDONE HCL 80 MG PO CAPS Oral Take 1 capsule (80 mg total) by mouth 2 (two) times daily with a meal. 60 capsule 0    BP 137/83  Pulse 115  Temp(Src) 97.4 F (36.3 C) (Oral)  Resp 20  SpO2 100%  Physical Exam  Nursing note and vitals reviewed. Constitutional: She is oriented to person, place, and time. She appears well-developed and well-nourished.  Non-toxic appearance. No distress.  HENT:  Head: Normocephalic and atraumatic.  Eyes: Conjunctivae, EOM and lids are normal. Pupils are equal, round, and reactive to light.  Neck: Normal range of motion. Neck supple. No tracheal deviation present. No mass present.  Cardiovascular: Regular rhythm and normal heart sounds.  Tachycardia present.  Exam reveals no gallop.   No murmur heard. Pulmonary/Chest: Effort normal and breath sounds normal. No stridor. No respiratory distress. She has no decreased breath sounds. She has no wheezes. She has no rhonchi. She has no rales.  Abdominal: Soft. Normal appearance and bowel sounds are normal. She exhibits no distension. There is no tenderness. There is no rebound and no CVA tenderness.  Musculoskeletal: Normal range of motion. She exhibits no edema and no tenderness.  Neurological: She is  alert and oriented to person, place, and time. She has normal strength. No cranial nerve deficit or sensory deficit. GCS eye subscore is 4. GCS verbal subscore is 5. GCS motor subscore is 6.  Skin: Skin is warm and dry. No abrasion and no rash noted.  Psychiatric: Her mood appears anxious. Her speech is rapid and/or pressured. She is agitated.    ED Course  Procedures (including critical care time)   Labs Reviewed  CBC  URINALYSIS, ROUTINE W REFLEX MICROSCOPIC  PREGNANCY, URINE  COMPREHENSIVE METABOLIC PANEL  ETHANOL  URINE RAPID DRUG SCREEN (HOSP PERFORMED)   No results found.   No diagnosis found.    MDM  8:09 AM Pt given geodon orally, out of  restraints, her act team to see her today        Toy Baker, MD 12/31/11 [phone]

## 2011-12-31 MED ORDER — LEVOTHYROXINE SODIUM 75 MCG PO TABS
75.0000 ug | ORAL_TABLET | Freq: Every day | ORAL | Status: DC
  Administered 2011-12-31: 75 ug via ORAL
  Filled 2011-12-31: qty 1

## 2011-12-31 MED ORDER — ZIPRASIDONE MESYLATE 20 MG IM SOLR
INTRAMUSCULAR | Status: AC
  Administered 2011-12-31: 10 mg via INTRAMUSCULAR
  Filled 2011-12-31: qty 20

## 2011-12-31 MED ORDER — LORAZEPAM 2 MG/ML IJ SOLN
2.0000 mg | Freq: Once | INTRAMUSCULAR | Status: AC
  Administered 2011-12-31: 2 mg via INTRAVENOUS
  Filled 2011-12-31: qty 1

## 2011-12-31 MED ORDER — ZIPRASIDONE HCL 20 MG PO CAPS: 80.0000 mg | ORAL_CAPSULE | Freq: Two times a day (BID) | ORAL | Status: DC

## 2011-12-31 MED ORDER — ZIPRASIDONE MESYLATE 20 MG IM SOLR
10.0000 mg | Freq: Once | INTRAMUSCULAR | Status: AC
  Administered 2011-12-31: 10 mg via INTRAMUSCULAR

## 2011-12-31 MED ORDER — ZIPRASIDONE HCL 20 MG PO CAPS
ORAL_CAPSULE | ORAL | Status: AC
  Filled 2011-12-31: qty 4

## 2011-12-31 MED ORDER — VITAMIN B-12 100 MCG PO TABS
100.0000 ug | ORAL_TABLET | Freq: Every day | ORAL | Status: DC
  Administered 2011-12-31: 100 ug via ORAL
  Filled 2011-12-31: qty 1

## 2011-12-31 MED ORDER — ZIPRASIDONE HCL 20 MG PO CAPS
80.0000 mg | ORAL_CAPSULE | Freq: Two times a day (BID) | ORAL | Status: DC
  Administered 2011-12-31: 80 mg via ORAL

## 2011-12-31 MED ORDER — TRAZODONE HCL 100 MG PO TABS: 100.0000 mg | ORAL_TABLET | Freq: Every day | ORAL | Status: DC

## 2011-12-31 NOTE — ED Notes (Signed)
Pt out in the hall way hollering loudly repeatedly that "I want to call my Mother". Has been told repeatedly, and given written note stating she can call her Mother at 9am. Then pt screaming that she is going to go to bed, into her room, and right back out screaming over and over again "I want to call my Mother'. Told at least 20 times she could call her Mother in the am. Pt cautioned that she was disturbing the other pt's, yet she persists in hollering loudly and running up and down the hallway.

## 2011-12-31 NOTE — ED Notes (Signed)
PT PACING UP AND DOWN THE HALL INTERMITTENTLY SHOUTING I AM GOING TO BED NOW, I WANT TO CALL MY MOTHER...AWAKENING ALL THE OTHER PT'S WHO ARE NOW AWAKE AND COMPLAINING ABOUT HER SHOUTING

## 2011-12-31 NOTE — ED Notes (Signed)
Pt persists in going in and out of her room, continuing to holler loudly, disturbing the other pt's, and refusing to lower her voice and lie down to sleep. She insists that she call her 85yr old mother. Pt is aware of 9a-9p rule for phone calls. Given multiple drinks of ginger ale per her request, and applesauce for a snack.

## 2011-12-31 NOTE — ED Notes (Signed)
Waiting for order from EDP. Pt continues to yell at top of lungs. Restraints to keep her in the room, from furthur disrupting other pt's.

## 2011-12-31 NOTE — ED Notes (Signed)
Pt continues to holler from bed. Waiting for EDP order.

## 2011-12-31 NOTE — ED Notes (Signed)
Dr. Harley Hallmark informed that pt is acting out, given specifics.Marland Kitchenordered Ativan 1mg ,po prn.Marland KitchenMarland KitchenMarland Kitchen

## 2011-12-31 NOTE — ED Notes (Signed)
Pt now calm, Geodon effective. Pt contracts for quiet, no yelling, and stay in room so as not to disturb other pt's sleeping. Restraints removed. VSS. Pt states she is going to sleep now. Covered with additional warm blanket. Remains under observation.

## 2011-12-31 NOTE — ED Notes (Signed)
Pt. Continues to yell at the top of her lungs "I'm not crazy, let me call my mother".  Checked on pt., in restraints from night shift.  Pt. States that she understands what behavior it will take to come out of restraints, and next moment begins to yell again.  Informed pt. That phone calls begin at 0900 with good behavior.  Released pt. From restraints to use bathroom and explained that quite behavior will get her released.  Pt. Used bathroom, ambulated back to room and began to scream again.  Pt. Put back in restraints.

## 2011-12-31 NOTE — ED Notes (Addendum)
Dr. Hyman Hopes informed of pt's acting out behavior. Geodon 10mg  IM ordered. Pt hollering that she wants "a hundred shots of Geodon".

## 2011-12-31 NOTE — ED Notes (Signed)
Pt up at nsg desk insistant that she is leaving and will call her mother to come get her. Redirected, reminded that it is 445am, her mother is sleeping, and does not drive in the dark. Pt returned to her room for time being.

## 2011-12-31 NOTE — BHH Counselor (Signed)
Pt to remain in the ED until intake is completed with staff from [facility]. The [facility] worker-Christine is currently here to see patient and in her room at this time. Patient and [facility] worker are completing a intake assessment for their ACT team program. Writer has asked patients nurse-Mike to make sure that Wynona Canes doesn't leave without speaking to SW or [facility] staff. Speaking with [facility] worker Wynona Canes will allow our staff to document and gain a understanding of patients follow up services. Patient has repeatedly came in the ED with similar complaints over the past week. It is important that she has appropriate follow up to assist her in the community so that she may maintain mental health stability.    Once Clinical research associate has spoken with [facility] worker pt may be appropriate for discharge if the ED agrees.Pt's disposition is pending at this time.

## 2011-12-31 NOTE — Discharge Instructions (Signed)
Follow-up as you have been instructed °

## 2011-12-31 NOTE — ED Provider Notes (Signed)
BP 141/85  Pulse 106  Temp(Src) 97.9 F (36.6 C) (Oral)  Resp 18  SpO2 94%   Per ED staff pt yelling out at the top of her lungs and "acting out". Geodon 10mg  IM ordered  Forbes Cellar, MD 12/31/11 [phone]

## 2011-12-31 NOTE — ED Notes (Signed)
Dr. Harley Hallmark called again by this RN to request additional medication since pt is lying in bed shouting at the top of her lungs, disrupting entire [facility] unit. EDP stated she would get back to me after dealing with transfer of another pt.

## 2012-01-01 ENCOUNTER — Encounter (HOSPITAL_COMMUNITY): Payer: Self-pay | Admitting: *Deleted

## 2012-01-01 ENCOUNTER — Emergency Department (HOSPITAL_COMMUNITY)
Admission: EM | Admit: 2012-01-01 | Discharge: 2012-01-01 | Disposition: A | Discharge status: Home / Self Care | Payer: Medicaid Other | Attending: Emergency Medicine | Admitting: Emergency Medicine

## 2012-01-01 DIAGNOSIS — Z79899 Other long term (current) drug therapy: Secondary | ICD-10-CM | POA: Insufficient documentation

## 2012-01-01 DIAGNOSIS — F3289 Other specified depressive episodes: Secondary | ICD-10-CM | POA: Insufficient documentation

## 2012-01-01 DIAGNOSIS — F329 Major depressive disorder, single episode, unspecified: Secondary | ICD-10-CM | POA: Insufficient documentation

## 2012-01-01 DIAGNOSIS — E079 Disorder of thyroid, unspecified: Secondary | ICD-10-CM | POA: Insufficient documentation

## 2012-01-01 DIAGNOSIS — F419 Anxiety disorder, unspecified: Secondary | ICD-10-CM

## 2012-01-01 DIAGNOSIS — F411 Generalized anxiety disorder: Secondary | ICD-10-CM | POA: Insufficient documentation

## 2012-01-01 DIAGNOSIS — F259 Schizoaffective disorder, unspecified: Secondary | ICD-10-CM | POA: Insufficient documentation

## 2012-01-01 LAB — COMPREHENSIVE METABOLIC PANEL
ALT: 27 U/L (ref 0–35)
AST: 25 U/L (ref 0–37)
Albumin: 4.3 g/dL (ref 3.5–5.2)
Alkaline Phosphatase: 72 U/L (ref 39–117)
BUN: 7 mg/dL (ref 6–23)
CO2: 25 mEq/L (ref 19–32)
Calcium: 10.2 mg/dL (ref 8.4–10.5)
Chloride: 103 mEq/L (ref 96–112)
Creatinine, Ser: 0.8 mg/dL (ref 0.50–1.10)
GFR calc Af Amer: >90 mL/min (ref >90)
GFR calc non Af Amer: 81 mL/min — ABNORMAL LOW (ref >90)
Glucose, Bld: 128 mg/dL — ABNORMAL HIGH (ref 70–99)
Potassium: 3.4 mEq/L — ABNORMAL LOW (ref 3.5–5.1)
Sodium: 140 mEq/L (ref 135–145)
Total Bilirubin: 0.6 mg/dL (ref 0.3–1.2)
Total Protein: 8.1 g/dL (ref 6.0–8.3)

## 2012-01-01 LAB — DIFFERENTIAL
Basophils Absolute: 0.1 10*3/uL (ref 0.0–0.1)
Basophils Relative: 1 % (ref 0–1)
Eosinophils Absolute: 0.2 10*3/uL (ref 0.0–0.7)
Eosinophils Relative: 3 % (ref 0–5)
Lymphocytes Relative: 17 % (ref 12–46)
Lymphs Abs: 1.4 10*3/uL (ref 0.7–4.0)
Monocytes Absolute: 0.7 10*3/uL (ref 0.1–1.0)
Monocytes Relative: 8 % (ref 3–12)
Neutro Abs: 6.1 10*3/uL (ref 1.7–7.7)
Neutrophils Relative %: 72 % (ref 43–77)

## 2012-01-01 LAB — CBC
HCT: 40.2 % (ref 36.0–46.0)
Hemoglobin: 13.9 g/dL (ref 12.0–15.0)
MCH: 30.1 pg (ref 26.0–34.0)
MCHC: 34.6 g/dL (ref 30.0–36.0)
MCV: 87 fL (ref 78.0–100.0)
Platelets: 364 10*3/uL (ref 150–400)
RBC: 4.62 MIL/uL (ref 3.87–5.11)
RDW: 13.8 % (ref 11.5–15.5)
WBC: 8.5 10*3/uL (ref 4.0–10.5)

## 2012-01-01 LAB — RAPID URINE DRUG SCREEN, HOSP PERFORMED
Amphetamines: NOT DETECTED
Barbiturates: NOT DETECTED
Benzodiazepines: POSITIVE — AB
Cocaine: NOT DETECTED
Opiates: NOT DETECTED
Tetrahydrocannabinol: NOT DETECTED

## 2012-01-01 LAB — ETHANOL: Alcohol, Ethyl (B): <11 mg/dL (ref 0–11)

## 2012-01-01 MED ORDER — ZIPRASIDONE HCL 20 MG PO CAPS: 80.0000 mg | ORAL_CAPSULE | Freq: Two times a day (BID) | ORAL | Status: DC

## 2012-01-01 MED ORDER — VITAMIN B-12 100 MCG PO TABS
100.0000 ug | ORAL_TABLET | Freq: Every day | ORAL | Status: DC
  Administered 2012-01-01: 100 ug via ORAL
  Filled 2012-01-01 (×2): qty 1

## 2012-01-01 MED ORDER — ZIPRASIDONE MESYLATE 20 MG IM SOLR
20.0000 mg | Freq: Once | INTRAMUSCULAR | Status: AC
  Administered 2012-01-01: 20 mg via INTRAMUSCULAR
  Filled 2012-01-01: qty 20

## 2012-01-01 MED ORDER — ONDANSETRON HCL 4 MG PO TABS: 4.0000 mg | ORAL_TABLET | Freq: Three times a day (TID) | ORAL | Status: DC | PRN

## 2012-01-01 MED ORDER — ZOLPIDEM TARTRATE 5 MG PO TABS: 5.0000 mg | ORAL_TABLET | Freq: Every evening | ORAL | Status: DC | PRN

## 2012-01-01 MED ORDER — ALUM & MAG HYDROXIDE-SIMETH 200-200-20 MG/5ML PO SUSP: 30.0000 mL | ORAL | Status: DC | PRN

## 2012-01-01 MED ORDER — TRAZODONE HCL 100 MG PO TABS: 100.0000 mg | ORAL_TABLET | Freq: Every day | ORAL | Status: DC

## 2012-01-01 MED ORDER — ACETAMINOPHEN 325 MG PO TABS: 650.0000 mg | ORAL_TABLET | ORAL | Status: DC | PRN

## 2012-01-01 MED ORDER — LORAZEPAM 1 MG PO TABS
1.0000 mg | ORAL_TABLET | Freq: Three times a day (TID) | ORAL | Status: DC | PRN
  Administered 2012-01-01: 1 mg via ORAL
  Filled 2012-01-01: qty 1

## 2012-01-01 MED ORDER — NICOTINE 21 MG/24HR TD PT24
21.0000 mg | MEDICATED_PATCH | Freq: Every day | TRANSDERMAL | Status: DC
  Filled 2012-01-01: qty 1

## 2012-01-01 MED ORDER — IBUPROFEN 600 MG PO TABS: 600.0000 mg | ORAL_TABLET | Freq: Three times a day (TID) | ORAL | Status: DC | PRN

## 2012-01-01 MED ORDER — LEVOTHYROXINE SODIUM 75 MCG PO TABS
75.0000 ug | ORAL_TABLET | Freq: Every day | ORAL | Status: DC
  Administered 2012-01-01: 75 ug via ORAL
  Filled 2012-01-01 (×2): qty 1

## 2012-01-01 NOTE — Discharge Instructions (Signed)
Anxiety and Panic Attacks Your caregiver has informed you that you are having an anxiety or panic attack. There may be many forms of this. Most of the time these attacks come suddenly and without warning. They come at any time of day, including periods of sleep, and at any time of life. They may be strong and unexplained. Although panic attacks are very scary, they are physically harmless. Sometimes the cause of your anxiety is not known. Anxiety is a protective mechanism of the body in its fight or flight mechanism. Most of these perceived danger situations are actually nonphysical situations (such as anxiety over losing a job). CAUSES  The causes of an anxiety or panic attack are many. Panic attacks may occur in otherwise healthy people given a certain set of circumstances. There may be a genetic cause for panic attacks. Some medications may also have anxiety as a side effect. SYMPTOMS  Some of the most common feelings are:  Intense terror.   Dizziness, feeling faint.   Hot and cold flashes.   Fear of going crazy.   Feelings that nothing is real.   Sweating.   Shaking.   Chest pain or a fast heartbeat (palpitations).   Smothering, choking sensations.   Feelings of impending doom and that death is near.   Tingling of extremities, this may be from over-breathing.   Altered reality (derealization).   Being detached from yourself (depersonalization).  Several symptoms can be present to make up anxiety or panic attacks. DIAGNOSIS  The evaluation by your caregiver will depend on the type of symptoms you are experiencing. The diagnosis of anxiety or panic attack is made when no physical illness can be determined to be a cause of the symptoms. TREATMENT  Treatment to prevent anxiety and panic attacks may include:  Avoidance of circumstances that cause anxiety.   Reassurance and relaxation.   Regular exercise.   Relaxation therapies, such as yoga.   Psychotherapy with a  psychiatrist or therapist.   Avoidance of caffeine, alcohol and illegal drugs.   Prescribed medication.  SEEK IMMEDIATE MEDICAL CARE IF:   You experience panic attack symptoms that are different than your usual symptoms.   You have any worsening or concerning symptoms.  Document Released: 09/30/2005 Document Revised: 09/19/2011 Document Reviewed: 02/01/2010 [facility] Patient Information 2012 [place], [place].Schizoaffective Disorder Schizoaffective disorder is a condition with a combination of features of schizophrenia and a mood disorder. There are symptoms of a psychotic disorder such as distorted thinking, hallucinations or delusions (schizophrenia feature) and depression or mania (mood disorder feature). There are 2 subtypes based on the mood component:   Bipolar Type.   Depressive Type.  CAUSES  Schizoaffective disorder, like schizophrenia, appears to have distinct genetic links. It is unknown as to what exactly causes the disorder. Some experts believe it involves brain chemistry such as an imbalance of serotonin and dopamine in the brain. These naturally occurring chemicals help relay electronic signals in the brain and help regulate mood. Other experts have speculated whether fetal exposure to toxins or viral illness, or even birth complications may play a role. However, there is no proof of this relationship. SYMPTOMS  Symptoms are those of both schizophrenia and mood disorders. These may include:  Hearing, seeing, or feeling things that are not there (hallucinations).   False beliefs (delusions).   Not taking care of oneself (for example, not bathing or grooming).   Speaking in a way that makes no sense to others.   Withdrawing or feeling isolated from  other people.   Thoughts that race from one idea to the next.   Feelings of sadness, guilt, hopelessness, and anxiety.   Feelings of being excessively happy, powerful, or energetic.   Feeling drained of energy.   Losing  or gaining weight.   Being unable to concentrate.   Sleeping more or less than normal.  DIAGNOSIS  The diagnosis is made when the patient has features of both illnesses. The patient does not strictly have schizophrenia or a mood disorder alone. Unfortunately, determining if a patient has 2 separate illnesses (schizophrenia or a mood disorder), a combination of illnesses (schizophrenia and a mood disorder), or perhaps even a separate illness apart from schizophrenia or a mood disorder is difficult. An accurate diagnosis has 3 key parts:  The patient clearly has a major depressive disorder or mania.   They meet the criteria for schizophrenia (distorted thinking, hallucinations or delusions).   The patient must have psychosis for at least 2 weeks without a mood disorder.  The diagnosis of this disorder frequently requires several evaluations. Caregivers will evaluate the patient's symptoms over a length of time. The diagnosis can be made when the following are observed:  An uninterrupted period of mental illness (possibly lasting weeks or months) occurs during which a major depressive episode, a manic episode (both are mood disorder components), or a mixed episode occurs with symptoms of schizophrenia. A major depressive episode must include a depressed mood. Also:   This uninterrupted period of illness includes a minimum 2 week episode of active distorted thinking, hallucinations or delusions (schizophrenia feature).   During this same 2 week (or greater) period, the patient has no mood changes of depression or mania.   Outside of the specific 2 week period noted above, mood episodes are present for a big part of the total active and lingering periods of illness.   The disturbance is not due to the direct effects of a substance (such as street drugs or prescription medications) or a general medical condition.   The bipolar type is diagnosed if the disturbance includes a period of mania, a  major depressive episode or a mixed episode.   The depressive type is diagnosed if the problem includes only major depressive episodes.  TREATMENT  Treatment usually includes a combination of medications and counseling. The exact regimen varies depending on the type and severity of symptoms. It also depends on whether the disorder is a depressive-type or bipolar-type. Medications are prescribed to help with psychotic symptoms, stabilize mood and treat depression. Psychotherapy can help curb distorted thoughts, teach appropriate social skills and decrease social isolation. Medications may include:  Antipsychotics. These are also called neuroleptics. These are prescribed to help with psychotic symptoms such as delusions, paranoia and hallucinations. Common medications in this category include clozapine (Clozaril), risperidone (Risperdal) and olanzapine (Zyprexa).   Mood-stabilizing medications. When the schizoaffective disorder is bipolar-type, mood stabilizers can level out the highs and lows of bipolar disorder, also known as manic depression. People with bipolar disorder have episodes of mania and depressed mood. Examples of mood stabilizers include lithium (Eskalith, Lithobid) and divalproex (Depakote).   Antidepressants. When depression is the underlying mood disorder, antidepressants can help with feelings of sadness, hopelessness, or difficulty with sleep and concentration. Common medications include citalopram (Celexa), fluoxetine and escitalopram (Lexapro).  Non-medication treatment may include:  Psychotherapy and counseling. Building a trusting relationship in therapy can help people with schizoaffective disorder better understand their condition and feel hopeful about their future. Effective sessions focus on  real-life plans, problems, and relationships. Skills and behaviors specific to the home or workplace may be discussed.   Family or group therapy. Treatment can be more effective when  people with schizoaffective disorder can discuss their problems with others. Supportive group settings can help decrease social isolation and provide a reality check during periods of psychosis.  In general, people with schizoaffective disorder have a better prognosis than people with schizophrenia, but not as good as people with mood disorders only. Long-term treatment is necessary The prognosis varies from person to person. HOME CARE INSTRUCTIONS   Take your medicine as prescribed, even when you are feeling and thinking well.   Participate in individual and group counseling as recommended by your caregiver.  SEEK MEDICAL CARE IF:   You feel that you are experiencing side effects from prescription medications.   Contact your caregiver if you feel that symptoms are worsening despite using medication as prescribed and participating in counseling/group therapy sessions.  SEEK IMMEDIATE MEDICAL CARE IF:  You feel an urge to hurt yourself or are thinking about committing suicide. This is an emergency and you should be evaluated immediately. MAKE SURE YOU:   Understand these instructions.   Will watch your condition.   Will get help right away if you are not doing well or get worse.  Document Released: 02/10/2007 Document Revised: 09/19/2011 Document Reviewed: 04/13/2007 [facility] Patient Information 2012 [place], [place].  RESOURCE GUIDE  Dental Problems  Patients with Medicaid: [facility] [phone] [address]                                           [phone] [address] Phone:  [phone]                                                  Phone:  [phone]  If unable to pay or uninsured, contact:  [facility] or [facility]. to become qualified for the adult dental clinic.  Chronic Pain Problems Contact [facility]  [phone] Patients need to be referred by their primary care doctor.  Insufficient  Money for Medicine Contact [employer]:  call "211" or [facility] [phone].  No Primary Care Doctor Call [facility]  [phone] Other agencies that provide inexpensive medical care    [facility]  [phone]    [facility]  [phone]    [facility]  [phone]    [facility]  [phone]    [facility]  [phone]    [facility]  [phone]  Psychological Services [facility]  [phone] [facility] Services  [phone] [facility]   [phone] (emergency services [phone])  Substance Abuse Resources [facility] and Drug Services  [phone] [facility] [phone] [facility] [phone] Floydene Flock [phone] Residential & Outpatient Substance Abuse Program  [phone]  Abuse/Neglect [facility] Child Abuse Hotline [phone] [facility] Child Abuse Hotline [phone] (After Hours)  Emergency Shelter [facility] [phone]  Maternity Homes Room at the [place] [facility] [place] [phone] [facility]  Services [phone]  MRSA Hotline #:   (503)503-8400    [facility] Resources  [facility] [place]     [employer]                          [facility] Dept. [address] [place]                       [address]      [address]  Blondell Reveal Phone:  [phone]                                   Phone:  [phone]                 Phone:  [phone]  [facility] Mental Health Phone:  [phone]  [facility] Child Abuse Hotline (904) 325-4638 [phone] (After Hours)

## 2012-01-01 NOTE — ED Notes (Signed)
Per gpd pt was at her neighbors house yelling. gpd called stating the pt was not allow there anymore. Pt was yelling and drank her own urine. Pt was screaming that she wanted to go to the hospital. Pt is manic. Pt cont's to yell " CAN I HAVE A CIGARETTE ". Pt will not answer question.

## 2012-01-01 NOTE — BH Assessment (Signed)
Assessment Note   Rebekah Pace is an 56 y.o. female who presents under IVC to [facility]. IVC papers state she was trespassing on neighbor's property and drinking her own urine. Pt is frequently seen at [facility] and was d/c from here 12/30/11. Pt denies SI and HI. She denies A/VH and no delusions noted. No substance use or abuse. Pt states she walked over to visit her neighbors when another neighbor called police. She denies drinking own urine. Pt describes mood as "fine". Her affect is euthymic and drowsy. Pt has recently completed intake process and is now under care of ACT Team at [facility].    Axis I: Schizoaffective D/O Axis II: Deferred Axis III:  Past Medical History  Diagnosis Date  . Thyroid disease   . Anxiety   . Mental disorder   . Depression   . Hepatitis   . Hypothyroidism    Axis IV: economic problems, other psychosocial or environmental problems, problems related to social environment and problems with primary support group Axis V: 51-60 moderate symptoms  Past Medical History:  Past Medical History  Diagnosis Date  . Thyroid disease   . Anxiety   . Mental disorder   . Depression   . Hepatitis   . Hypothyroidism     Past Surgical History  Procedure Date  . Arm fracture   . Rotator cuff repair     Family History: No family history on file.  Social History:  reports that she has never smoked. She has never used smokeless tobacco. She reports that she drinks alcohol. She reports that she does not use illicit drugs.  Additional Social History:  Alcohol / Drug Use Pain Medications: as prescribed Prescriptions: as prescribed Over the Counter: as needed History of alcohol / drug use?: No history of alcohol / drug abuse Longest period of sobriety (when/how long): na Allergies:  Allergies  Allergen Reactions  . Sulfa Antibiotics Rash    Home Medications:  Medications Prior to Admission  Medication Sig Dispense Refill  . cyanocobalamin 100 MCG tablet Take 100 mcg by  mouth daily. B12 supplement forgot to put it in the bag, got last in the hosp      . levothyroxine (SYNTHROID, LEVOTHROID) 75 MCG tablet Take 1 tablet (75 mcg total) by mouth daily. Thyroid supplement.  30 tablet  0  . traZODone (DESYREL) 100 MG tablet Take 1 tablet (100 mg total) by mouth at bedtime. For sleep.  30 tablet  0  . ziprasidone (GEODON) 80 MG capsule Take 80 mg by mouth 2 (two) times daily with a meal. For mood stability and clear thoughts.       Medications Prior to Admission  Medication Dose Route Frequency Provider Last Rate Last Dose  . acetaminophen (TYLENOL) tablet 650 mg  650 mg Oral Q4H PRN Raeford Razor, MD      . alum & mag hydroxide-simeth (MAALOX/MYLANTA) 200-200-20 MG/5ML suspension 30 mL  30 mL Oral PRN Raeford Razor, MD      . ibuprofen (ADVIL,MOTRIN) tablet 600 mg  600 mg Oral Q8H PRN Raeford Razor, MD      . levothyroxine (SYNTHROID, LEVOTHROID) tablet 75 mcg  75 mcg Oral Daily Raeford Razor, MD   75 mcg at 01/01/12 1831  . LORazepam (ATIVAN) injection 2 mg  2 mg Intravenous Once Toy Baker, MD   2 mg at 12/31/11 0820  . LORazepam (ATIVAN) tablet 1 mg  1 mg Oral Q8H PRN Raeford Razor, MD   1 mg at 01/01/12  1626  . nicotine (NICODERM CQ - dosed in mg/24 hours) patch 21 mg  21 mg Transdermal Daily Raeford Razor, MD      . ondansetron [facility]) tablet 4 mg  4 mg Oral Q8H PRN Raeford Razor, MD      . traZODone (DESYREL) tablet 100 mg  100 mg Oral QHS Raeford Razor, MD      . vitamin B-12 (CYANOCOBALAMIN) tablet 100 mcg  100 mcg Oral Daily Raeford Razor, MD   100 mcg at 01/01/12 1831  . ziprasidone (GEODON) capsule 80 mg  80 mg Oral BID WC Raeford Razor, MD      . ziprasidone (GEODON) capsule 80 mg  80 mg Oral BID WC Raeford Razor, MD      . ziprasidone (GEODON) injection 10 mg  10 mg Intramuscular Once Forbes Cellar, MD   10 mg at 12/31/11 0040  . ziprasidone (GEODON) injection 20 mg  20 mg Intramuscular Once Raeford Razor, MD   20 mg at 01/01/12 1719  . zolpidem  (AMBIEN) tablet 5 mg  5 mg Oral QHS PRN Raeford Razor, MD      . DISCONTD: acetaminophen (TYLENOL) tablet 650 mg  650 mg Oral Q4H PRN Toy Baker, MD      . DISCONTD: alum & mag hydroxide-simeth (MAALOX/MYLANTA) 200-200-20 MG/5ML suspension 30 mL  30 mL Oral PRN Toy Baker, MD      . DISCONTD: ibuprofen (ADVIL,MOTRIN) tablet 600 mg  600 mg Oral Q8H PRN Toy Baker, MD   600 mg at 12/30/11 2308  . DISCONTD: levothyroxine (SYNTHROID, LEVOTHROID) tablet 75 mcg  75 mcg Oral Q breakfast Toy Baker, MD   75 mcg at 12/31/11 0846  . DISCONTD: LORazepam (ATIVAN) tablet 1 mg  1 mg Oral Q8H PRN Toy Baker, MD   1 mg at 12/31/11 9562  . DISCONTD: nicotine (NICODERM CQ - dosed in mg/24 hours) patch 21 mg  21 mg Transdermal Daily Toy Baker, MD      . DISCONTD: ondansetron [facility]) tablet 4 mg  4 mg Oral Q8H PRN Toy Baker, MD      . DISCONTD: traZODone (DESYREL) tablet 100 mg  100 mg Oral QHS Toy Baker, MD      . DISCONTD: vitamin B-12 (CYANOCOBALAMIN) tablet 100 mcg  100 mcg Oral Daily Toy Baker, MD   100 mcg at 12/31/11 0954  . DISCONTD: ziprasidone (GEODON) capsule 80 mg  80 mg Oral BID WC Toy Baker, MD   80 mg at 12/31/11 1308  . DISCONTD: ziprasidone (GEODON) capsule 80 mg  80 mg Oral BID WC Toy Baker, MD        OB/GYN Status:  No LMP recorded. Patient is postmenopausal.  General Assessment Data Location of Assessment: [facility] Living Arrangements: Alone Can pt return to current living arrangement?: Yes Admission Status: Involuntary Is patient capable of signing voluntary admission?: No Transfer from: Acute Hospital Referral Source:  (gpd)  Education Status Is patient currently in school?: No Current Grade: na Highest grade of school patient has completed: college grad Name of school: [employer] Contact person: na  Risk to self Suicidal Ideation: No Suicidal Intent: No Is patient at risk for suicide?: No Suicidal Plan?: No Access to  Means: No What has been your use of drugs/alcohol within the last 12 months?: na Previous Attempts/Gestures: No How many times?: 0  Other Self Harm Risks: no Triggers for Past Attempts:  (na) Intentional Self Injurious Behavior:  None Family Suicide History: No Recent stressful life event(s):  (mother verbally abusive to her) Persecutory voices/beliefs?: No Depression: No Substance abuse history and/or treatment for substance abuse?: No Suicide prevention information given to non-admitted patients: Not applicable  Risk to Others Homicidal Ideation: No Thoughts of Harm to Others: No Current Homicidal Intent: No Current Homicidal Plan: No Access to Homicidal Means: No Identified Victim: na History of harm to others?: No Assessment of Violence: None Noted Violent Behavior Description: na Does patient have access to weapons?: No Criminal Charges Pending?: No Describe Pending Criminal Charges: na Does patient have a court date: No  Psychosis Hallucinations: None noted Delusions: None noted  Mental Status Report Appear/Hygiene: Disheveled Eye Contact: Good Motor Activity: Freedom of movement Speech: Logical/coherent;Slurred Level of Consciousness: Alert;Drowsy Mood:  (euthymic) Affect:  (euthymic) Anxiety Level: Minimal Thought Processes: Relevant;Coherent Judgement: Impaired Orientation: Person;Place;Time;Situation Obsessive Compulsive Thoughts/Behaviors: None  Cognitive Functioning Concentration: Normal Memory: Recent Intact;Remote Intact IQ: Average Insight: Poor Impulse Control: Poor Appetite: Good Weight Loss: 0  Weight Gain: 0  Sleep: No Change Total Hours of Sleep: 8  Vegetative Symptoms: None  Prior Inpatient Therapy Prior Inpatient Therapy: Yes Prior Therapy Dates: 2012 Prior Therapy Facilty/Provider(s): [facility] Reason for Treatment: Psychosis   Prior Outpatient Therapy Prior Outpatient Therapy: Yes Prior Therapy Dates: Current  Prior Therapy  Facilty/Provider(s): [facility] Reason for Treatment: med management  ADL Screening (condition at time of admission) Patient's cognitive ability adequate to safely complete daily activities?: Yes Patient able to express need for assistance with ADLs?: Yes Independently performs ADLs?: Yes Weakness of Legs: None Weakness of Arms/Hands: None  Home Assistive Devices/Equipment Home Assistive Devices/Equipment: None    Abuse/Neglect Assessment (Assessment to be complete while patient is alone) Physical Abuse: Denies Verbal Abuse: Denies Sexual Abuse: Denies Exploitation of patient/patient's resources: Denies Self-Neglect: Denies Values / Beliefs Cultural Requests During Hospitalization: None Spiritual Requests During Hospitalization: None   Advance Directives (For Healthcare) Advance Directive: Patient does not have advance directive    Additional Information 1:1 In Past 12 Months?: No CIRT Risk: No Elopement Risk: No Does patient have medical clearance?: Yes     Disposition:  Disposition Disposition of Patient: Other dispositions Other disposition(s): To current provider;Other (Comment) Patient referred to: Other (Comment) ([facility]) Patient will be discharged and IVC rescinded. Meagan from [facility] will transport pt back home. Pt encouraged to call [facility] when needed. On Site Evaluation by:   Reviewed with Physician:     Donnamarie Rossetti P 01/01/2012 10:10 PM

## 2012-01-01 NOTE — ED Provider Notes (Signed)
History    56 year old female brought in by police for evaluation. Police were called because pt banging on neighbor's door and refusing to leave. She was arrested for trespassing. Brought to the emergency room because of bizarre behavior. Patient has not been very cooperative with questioning. Repeatedly asking for a cigarette but otherwise ignores most questions.  CSN: [id number]  Arrival date & time 01/01/12  1430   First MD Initiated Contact with Patient 01/01/12 1524      Chief Complaint  Patient presents with  . Medical Clearance    (Consider location/radiation/quality/duration/timing/severity/associated sxs/prior treatment) HPI  Past Medical History  Diagnosis Date  . Thyroid disease   . Anxiety   . Mental disorder   . Depression   . Hepatitis   . Hypothyroidism     Past Surgical History  Procedure Date  . Arm fracture   . Rotator cuff repair     No family history on file.  History  Substance Use Topics  . Smoking status: Never Smoker   . Smokeless tobacco: Never Used  . Alcohol Use: Yes    OB History    Grav Para Term Preterm Abortions TAB SAB Ect Mult Living                  Review of Systems  Level V caveat applies because pt is mentally ill and refusing to answer most questions.  Allergies  Sulfa antibiotics  Home Medications   Current Outpatient Rx  Name Route Sig Dispense Refill  . CYANOCOBALAMIN 100 MCG PO TABS Oral Take 100 mcg by mouth daily. B12 supplement forgot to put it in the bag, got last in the hosp    . LEVOTHYROXINE SODIUM 75 MCG PO TABS Oral Take 1 tablet (75 mcg total) by mouth daily. Thyroid supplement. 30 tablet 0  . TRAZODONE HCL 100 MG PO TABS Oral Take 1 tablet (100 mg total) by mouth at bedtime. For sleep. 30 tablet 0  . ZIPRASIDONE HCL 80 MG PO CAPS Oral Take 80 mg by mouth 2 (two) times daily with a meal. For mood stability and clear thoughts.      BP 144/85  Pulse 101  Temp(Src) 98.1 F (36.7 C) (Oral)  Resp 16   SpO2 100%  Physical Exam  Nursing note and vitals reviewed. Constitutional: She appears well-developed. No distress.  HENT:  Head: Normocephalic and atraumatic.  Eyes: Conjunctivae are normal. Pupils are equal, round, and reactive to light. Right eye exhibits no discharge. Left eye exhibits no discharge.  Neck: Neck supple.  Cardiovascular: Normal rate, regular rhythm and normal heart sounds.  Exam reveals no gallop and no friction rub.   No murmur heard. Pulmonary/Chest: Effort normal and breath sounds normal. No respiratory distress.  Abdominal: Soft. She exhibits no distension. There is no tenderness.  Musculoskeletal: She exhibits no edema and no tenderness.  Neurological: She is alert. She exhibits normal muscle tone. Coordination normal.  Skin: Skin is warm and dry.  Psychiatric:       Appears slightly agitated. Pressured speech.    ED Course  Procedures (including critical care time)  Labs Reviewed  COMPREHENSIVE METABOLIC PANEL - Abnormal; Notable for the following:    Potassium 3.4 (*)    Glucose, Bld 128 (*)    GFR calc non Af Amer 81 (*)    All other components within normal limits  URINE RAPID DRUG SCREEN (HOSP PERFORMED) - Abnormal; Notable for the following:    Benzodiazepines POSITIVE (*)  All other components within normal limits  CBC  DIFFERENTIAL  ETHANOL  LAB REPORT - SCANNED   No results found.   1. Anxiety   2. Schizoaffective disorder       MDM  56 year old female brought in by police for bizarre behavior. We'll medically screen. Patient does not appear to have ability to take care of herself and her current state. Will discuss with behavioral health.        Raeford Razor, MD 01/17/12 [phone]

## 2012-01-01 NOTE — ED Notes (Signed)
Wants to go home, when asked if she wanted her Geodon injection or po, she stated she wanted injection, that this would help her more w/the injection. Injection was given

## 2012-01-01 NOTE — Progress Notes (Signed)
CSW made aware of pt in ED. CSW called Christine at [facility] (pt's Community ACT team) and informed her of pt's location.  [facility] should be kept informed of pt's disposition and [facility]/ACT/CSW team can speak with any individual at [facility] about patient.  Pending med clearance.  Manson Passey Kuzey Ogata ANN S , MSW, LCSWA 01/01/2012 2:54 PM [phone]

## 2012-01-13 ENCOUNTER — Encounter (HOSPITAL_COMMUNITY): Payer: Self-pay | Admitting: *Deleted

## 2012-01-13 ENCOUNTER — Emergency Department (HOSPITAL_COMMUNITY)
Admission: EM | Admit: 2012-01-13 | Discharge: 2012-01-13 | Disposition: A | Discharge status: Home / Self Care | Payer: Medicaid Other | Attending: Emergency Medicine | Admitting: Emergency Medicine

## 2012-01-13 DIAGNOSIS — F319 Bipolar disorder, unspecified: Secondary | ICD-10-CM | POA: Insufficient documentation

## 2012-01-13 DIAGNOSIS — Z8659 Personal history of other mental and behavioral disorders: Secondary | ICD-10-CM | POA: Insufficient documentation

## 2012-01-13 DIAGNOSIS — Z79899 Other long term (current) drug therapy: Secondary | ICD-10-CM | POA: Insufficient documentation

## 2012-01-13 DIAGNOSIS — E079 Disorder of thyroid, unspecified: Secondary | ICD-10-CM | POA: Insufficient documentation

## 2012-01-13 LAB — COMPREHENSIVE METABOLIC PANEL
ALT: 24 U/L (ref 0–35)
AST: 26 U/L (ref 0–37)
Albumin: 4 g/dL (ref 3.5–5.2)
CO2: 25 mEq/L (ref 19–32)
Chloride: 102 mEq/L (ref 96–112)
Creatinine, Ser: 0.86 mg/dL (ref 0.50–1.10)
GFR calc non Af Amer: 74 mL/min — ABNORMAL LOW (ref >90)
Sodium: 138 mEq/L (ref 135–145)
Total Bilirubin: 0.3 mg/dL (ref 0.3–1.2)

## 2012-01-13 LAB — URINALYSIS, ROUTINE W REFLEX MICROSCOPIC
Hgb urine dipstick: NEGATIVE
Nitrite: NEGATIVE
Protein, ur: NEGATIVE mg/dL
Specific Gravity, Urine: 1.014 (ref 1.005–1.030)
Urobilinogen, UA: 0.2 mg/dL (ref 0.0–1.0)

## 2012-01-13 LAB — DIFFERENTIAL
Basophils Absolute: 0.1 10*3/uL (ref 0.0–0.1)
Basophils Relative: 1 % (ref 0–1)
Lymphocytes Relative: 20 % (ref 12–46)
Monocytes Absolute: 0.5 10*3/uL (ref 0.1–1.0)
Neutro Abs: 4.4 10*3/uL (ref 1.7–7.7)
Neutrophils Relative %: 68 % (ref 43–77)

## 2012-01-13 LAB — CBC
MCHC: 33.3 g/dL (ref 30.0–36.0)
Platelets: 267 10*3/uL (ref 150–400)
RDW: 13.4 % (ref 11.5–15.5)
WBC: 6.4 10*3/uL (ref 4.0–10.5)

## 2012-01-13 LAB — RAPID URINE DRUG SCREEN, HOSP PERFORMED
Barbiturates: NOT DETECTED
Benzodiazepines: NOT DETECTED
Cocaine: NOT DETECTED
Tetrahydrocannabinol: NOT DETECTED

## 2012-01-13 LAB — ETHANOL: Alcohol, Ethyl (B): <11 mg/dL (ref 0–11)

## 2012-01-13 LAB — TSH: TSH: 0.902 u[IU]/mL (ref 0.350–4.500)

## 2012-01-13 MED ORDER — NICOTINE 21 MG/24HR TD PT24
21.0000 mg | MEDICATED_PATCH | Freq: Every day | TRANSDERMAL | Status: DC
  Filled 2012-01-13: qty 1

## 2012-01-13 MED ORDER — LORAZEPAM 1 MG PO TABS
1.0000 mg | ORAL_TABLET | Freq: Three times a day (TID) | ORAL | Status: DC | PRN
  Administered 2012-01-13: 1 mg via ORAL
  Filled 2012-01-13: qty 1

## 2012-01-13 MED ORDER — ZOLPIDEM TARTRATE 5 MG PO TABS: 5.0000 mg | ORAL_TABLET | Freq: Every evening | ORAL | Status: DC | PRN

## 2012-01-13 MED ORDER — ACETAMINOPHEN 325 MG PO TABS: 650.0000 mg | ORAL_TABLET | ORAL | Status: DC | PRN

## 2012-01-13 MED ORDER — ONDANSETRON HCL 4 MG PO TABS: 4.0000 mg | ORAL_TABLET | Freq: Three times a day (TID) | ORAL | Status: DC | PRN

## 2012-01-13 MED ORDER — IBUPROFEN 200 MG PO TABS: 600.0000 mg | ORAL_TABLET | Freq: Three times a day (TID) | ORAL | Status: DC | PRN

## 2012-01-13 MED ORDER — ALUM & MAG HYDROXIDE-SIMETH 200-200-20 MG/5ML PO SUSP: 30.0000 mL | ORAL | Status: DC | PRN

## 2012-01-13 NOTE — BH Assessment (Signed)
Assessment Note   Rebekah Pace is an 56 y.o. female. Pt brought in by [employer], [employer] stating "she was found on the neighbor's porch & demanding a pill & cigarette"; pt presents with tremors, is requesting a pill. Patient asked what type of pill and she responds "Trazadone". She is well known to the hospital system and has presented in our ED multiple x's in the past few months. Her presentation is typically similar as it appears today. She appears anxious, laughs inappropriately, confused, and has poor insight on why she is here today. Pt displays manic-like symptoms. She denies SI, HI, and psychosis. She is currently receiving community ACT services. She is new to their services and sts that they provide her with her prescribed medications.   Spoke to Dr. Hyman Hopes regarding patients care and she sts that due to patients frequent visits in the ER and reports from [facility] of patients inhabitable living conditions she needs alternative housing (ie. Assistant living or group home). Writer asked patient if she was willing to live in either facility or housing setting and patient declined. She sts, "I am to young for that and I am only here for a pill".  Patients nurse-Jackie will initiate patients telepsych and pt's disposition will be determined upon completion.     Axis I: Bipolar, Manic and Mood Disorder NOS Axis II: Deferred Axis III:  Past Medical History  Diagnosis Date  . Thyroid disease   . Anxiety   . Mental disorder   . Depression   . Hepatitis   . Hypothyroidism    Axis IV: economic problems, educational problems, housing problems, occupational problems, other psychosocial or environmental problems, problems related to social environment, problems with access to health care services and problems with primary support group Axis V: 31-40 impairment in reality testing  Past Medical History:  Past Medical History  Diagnosis Date  . Thyroid disease   . Anxiety   . Mental disorder   . Depression    . Hepatitis   . Hypothyroidism     Past Surgical History  Procedure Date  . Arm fracture   . Rotator cuff repair     Family History: No family history on file.  Social History:  reports that she has never smoked. She has never used smokeless tobacco. She reports that she drinks alcohol. She reports that she does not use illicit drugs.  Additional Social History:  Alcohol / Drug Use Pain Medications: as presribed Prescriptions: as presribed Over the Counter: as prescribed History of alcohol / drug use?: No history of alcohol / drug abuse Longest period of sobriety (when/how long): n/a Allergies:  Allergies  Allergen Reactions  . Sulfa Antibiotics Rash    Home Medications:  Medications Prior to Admission  Medication Dose Route Frequency Provider Last Rate Last Dose  . acetaminophen (TYLENOL) tablet 650 mg  650 mg Oral Q4H PRN Forbes Cellar, MD      . alum & mag hydroxide-simeth (MAALOX/MYLANTA) 200-200-20 MG/5ML suspension 30 mL  30 mL Oral PRN Forbes Cellar, MD      . ibuprofen (ADVIL,MOTRIN) tablet 600 mg  600 mg Oral Q8H PRN Forbes Cellar, MD      . LORazepam (ATIVAN) tablet 1 mg  1 mg Oral Q8H PRN Forbes Cellar, MD   1 mg at 01/13/12 1021  . nicotine (NICODERM CQ - dosed in mg/24 hours) patch 21 mg  21 mg Transdermal Daily Forbes Cellar, MD      . ondansetron [facility]) tablet 4 mg  4 mg Oral Q8H PRN Forbes Cellar, MD      . zolpidem (AMBIEN) tablet 5 mg  5 mg Oral QHS PRN Forbes Cellar, MD       Medications Prior to Admission  Medication Sig Dispense Refill  . cyanocobalamin 100 MCG tablet Take 100 mcg by mouth daily. B12 supplement forgot to put it in the bag, got last in the hosp      . levothyroxine (SYNTHROID, LEVOTHROID) 75 MCG tablet Take 1 tablet (75 mcg total) by mouth daily. Thyroid supplement.  30 tablet  0  . traZODone (DESYREL) 100 MG tablet Take 1 tablet (100 mg total) by mouth at bedtime. For sleep.  30 tablet  0  . ziprasidone (GEODON) 80 MG capsule  Take 80 mg by mouth 2 (two) times daily with a meal. For mood stability and clear thoughts.        OB/GYN Status:  No LMP recorded. Patient is postmenopausal.  General Assessment Data Location of Assessment: [facility] Living Arrangements: Alone Can pt return to current living arrangement?: Yes Admission Status: Involuntary Is patient capable of signing voluntary admission?: No Transfer from: Acute Hospital Referral Source:  (pt brought to [facility] by [employer])     Risk to self Suicidal Ideation: No Suicidal Intent: No Is patient at risk for suicide?: No Suicidal Plan?: No Access to Means: No What has been your use of drugs/alcohol within the last 12 months?:  (n/a) Previous Attempts/Gestures: No How many times?:  (0) Other Self Harm Risks:  (n/a) Triggers for Past Attempts: None known Intentional Self Injurious Behavior: None Family Suicide History: No Recent stressful life event(s):  (pt does not identify any stressors) Persecutory voices/beliefs?: No Depression: No Depression Symptoms:  (pt denies depressive symptoms) Substance abuse history and/or treatment for substance abuse?: No Suicide prevention information given to non-admitted patients: Not applicable  Risk to Others Homicidal Ideation: No Thoughts of Harm to Others: No Current Homicidal Intent: No Current Homicidal Plan: No Access to Homicidal Means: No Identified Victim:  (n/a) History of harm to others?: No Assessment of Violence: None Noted Violent Behavior Description:  (pt currently cooperative; appears manic, repeats questions) Does patient have access to weapons?: No Criminal Charges Pending?: No Describe Pending Criminal Charges:  (n/a) Does patient have a court date: No Court Date:  (n/a)  Psychosis Hallucinations: None noted Delusions: None noted  Mental Status Report Appear/Hygiene: Disheveled Eye Contact: Good Motor Activity: Freedom of movement Speech: Logical/coherent Level of Consciousness:  Alert;Drowsy Anxiety Level: Minimal Thought Processes: Relevant Judgement: Impaired Orientation: Person;Place;Time;Situation Obsessive Compulsive Thoughts/Behaviors: None  Cognitive Functioning Concentration: Normal Memory: Recent Intact;Remote Intact IQ: Average Insight: Poor Impulse Control: Poor Appetite: Good Weight Loss:  (0) Weight Gain:  (0) Sleep: No Change Total Hours of Sleep:  (4 hrs per night) Vegetative Symptoms: None  Prior Inpatient Therapy Prior Inpatient Therapy: Yes Prior Therapy Dates: 2012 Prior Therapy Facilty/Provider(s): [facility] Reason for Treatment: Psychosis   Prior Outpatient Therapy Prior Outpatient Therapy: Yes Prior Therapy Dates: Current  Prior Therapy Facilty/Provider(s): [facility] Reason for Treatment: med management  ADL Screening (condition at time of admission) Patient's cognitive ability adequate to safely complete daily activities?: No Patient able to express need for assistance with ADLs?: No Independently performs ADLs?: No Communication: Independent Dressing (OT): Independent Grooming: Independent Feeding: Independent Bathing: Independent Toileting: Independent In/Out Bed: Independent Walks in Home: Independent Weakness of Legs: None Weakness of Arms/Hands: None  Home Assistive Devices/Equipment Home Assistive Devices/Equipment: None    Abuse/Neglect Assessment (Assessment to  be complete while patient is alone) Physical Abuse: Denies Verbal Abuse: Denies Sexual Abuse: Denies Exploitation of patient/patient's resources: Denies Self-Neglect: Denies Values / Beliefs Cultural Requests During Hospitalization: None Spiritual Requests During Hospitalization: None   Advance Directives (For Healthcare) Advance Directive: Patient does not have advance directive Type of Advance Directive: Other (Comment) (pt does not have health care power of attorney) Nutrition Screen Diet: Regular Unintentional weight loss greater than 10lbs  within the last month: No Problems chewing or swallowing foods and/or liquids: No Home Tube Feeding or Total Parenteral Nutrition (TPN): No Patient appears severely malnourished: No Pregnant or Lactating: No  Additional Information 1:1 In Past 12 Months?: No CIRT Risk: No Elopement Risk: No Does patient have medical clearance?: Yes     Disposition:  Disposition Disposition of Patient: Other dispositions (Disposition pending telepsych) Type of inpatient treatment program: Adult  On Site Evaluation by:   Reviewed with Physician:     Melynda Ripple [facility] 01/13/2012 11:04 AM

## 2012-01-13 NOTE — ED Notes (Signed)
Tele psych form faxed

## 2012-01-13 NOTE — ED Notes (Signed)
CSW met with pt at [facility] request. Pt currently receives psychiatric services through [facility] and community ACT Team through [facility] 5 days weekly to assist with administering medications. CSW discussed with the pt her current living situation and the issues she has been having with her neighbors. Pt states that she "knows she has a stigma because of her mental health disorders and that today there was a misunderstanding with her neighbors." Pt states that she is trying to work on her relationship with her neighbors because she does not "want them to call the police on her again." Pt states that she has been meeting regularly with [facility] and [facility] also adding that she has been taking her medications. CSW explored the possibility of an adult care home or assisted living facility to assist with mental health/medical needs, though pt states that she does not "feel that she is in need of that yet, that she is too young." CSW explained that while some facilities have more geriatric residents, some facilities focus on the adult population. Pt again stated that she is not interested in a [facility] or ALF at this time.   With pt's consent, CSW contacted [name] with [facility] [place] to review services in the community pt would benefit from. [name] confirmed that the pt gets services 5 days weekly at this time. [name] agreed to further talk with the ACT team and pt during upcoming meetings to further assess if an adult care home would be more beneficial to the pt long term. [name] has also agreed to pick the pt up from the hospital and continue her medications at this time. RN has been made aware of the transportation arrangement.  No further CSW needs identified.   Consult to CSW as needed.

## 2012-01-13 NOTE — Discharge Instructions (Signed)
Manic Depression (Bipolar Disorder)  Bipolar disorder is also known as manic depressive illness. It is when the brain does not function properly and causes shifts in a person's moods, energy and ability to function in everyday life. These shifts are different from the normal ups and downs that everyone experiences. Instead the shifts are severe. If this goes untreated, the person's life becomes more and more disorderly. People with this disorder can be treated can lead full and productive lives. This disorder must be managed throughout life.   SYMPTOMS    Bipolar disorder causes dramatic mood swings. These mood swings go in cycles. They cycle from extreme "highs" and irritable to deep "lows" of sadness and hopelessness.   Between the extreme moods, there are usually periods of normal mood.   Along with the mood shifts, the person will have severe changes in energy and behavior. The periods of "highs" and "lows" are called episodes of mania and depression.  Signs of mania:   Lots of energy, activity and restlessness.   Extreme "high" or good mood.   Extreme irritability.   Racing thoughts and talking very fast.   Jumping from one idea to another.   Not able to focus, easily distracted.   Little need to sleep.   Grand beliefs in one's abilities and powers.   Spending sprees.   Increased sexual drive. This can result in many sexual partners.   Poor judgment.   Abuse of drugs, particularly cocaine, alcohol, and sleeping medication.   Aggressive or provocative behavior.   A lasting period of behavior that is different from usual.   Denial that anything is wrong.  *A manic episode is identified if a "high" mood happens with three or more of the other symptoms lasting most of the day, nearly everyday for a week or longer. If the mood is more irritable in nature, four additional symptoms must be present.  Signs of depression:   Lasting feelings of sadness, anxiety, or empty mood.   Feelings of  hopelessness with negative thoughts.   Feelings of guilt, worthlessness, or helplessness.   Loss of interest or pleasure in activities once enjoyed, including sex.   Feelings of fatigue or having less energy.   Trouble focusing, making decisions, remembering.   Feeling restless or irritable.   Sleeping too little or too much.   Change in eating with possible weight gain or loss.   Feeling ongoing pain that is not caused by physical illness or injury.   Thoughts of death or suicide or suicide attempts.  *A depressive episode is identified as having five or more of the above symptoms that last most of the day, nearly everyday for two weeks or longer.  CAUSES    Research shows that there is no single cause for the disorder. Many factors act together to produce the illness.   This can be passed down from family (hereditary).   Environment may play a part.  TREATMENT    Long-term treatment is strongly recommended because bipolar disorder is a repeated illness. This disorder is better controlled if treatment is ongoing than if it is off and on.   A combination of medication and talk therapy is best for managing the disorder over time.   Medication.   Medication can be prescribed by a doctor that is an expert in treating mental disorders (psychiatrists). Medications known as "mood stabilizers" are usually prescribed to help control the illness. Other medications can be added when needed. These medicines usually treat episodes   mood stabilizer.   Talk Therapy.   Along with medication, some forms of talk therapy are helpful in providing support, education and guidance to people with the illness and their families. Studies show that this type of treatment increases mood stability, decreases need for hospitalization and improves how they function society.   Electroconvulsive Therapy (ECT).   In extreme situations where  the above treatments do not work or work too slowly to relieve severe symptoms, ECT may be considered.  Document Released: 01/06/2001 Document Revised: 09/19/2011 Document Reviewed: 08/28/2007 [facility] Patient Information 2012 [place], [place].  RESOURCE GUIDE  Dental Problems  Patients with Medicaid: [facility] [phone] [address]                                           [phone] Phone:  [phone]                                                   Phone:  [phone]  If unable to pay or uninsured, contact:  [facility] or [facility]. to become qualified for the adult dental clinic.  Chronic Pain Problems Contact [facility]  [phone] Patients need to be referred by their primary care doctor.  Insufficient Money for Medicine Contact [employer]:  call "211" or [facility] 925-234-9102.  No Primary Care Doctor Call [facility]  [phone] Other agencies that provide inexpensive medical care    [facility]  [phone]    [facility] Internal Medicine  [phone]    [facility]  [phone]    [facility]  [phone]    [facility]  [phone]    [facility]  [phone]  Psychological Services [facility] Behavioral Health  [phone] [facility]  [phone] [facility] Mental Health   [phone] (emergency services [phone])  Abuse/Neglect [facility] Child Abuse Hotline 548-155-7332 [facility] Child Abuse Hotline 919-597-0992 (After Hours)  Emergency Shelter [facility] (409)[phone]  Maternity Homes Room at the [place] [facility] [place] 804-556-4075 [facility] [phone]  MRSA Hotline #:   (438) 110-4099    [facility] Resources  [facility]  [employer]                           [facility] Dept. [address] [place]                     [address]         [address]  Patrecia Pace  [facility] Phone:  [phone]                                  Phone:  [phone]                   Phone:  [phone]  [facility] Mental Health Phone:  [phone]  [facility] Child Abuse Hotline 225-803-2567 [phone] (After Hours)

## 2012-01-13 NOTE — ED Notes (Signed)
Pt's caregiver, Doloris Hall, called and is aware that ACT team is coming to get pt. Stated he would meet in waiting room.

## 2012-01-13 NOTE — ED Notes (Signed)
Contacted tele psych-spoke to [facility]

## 2012-01-13 NOTE — ED Notes (Signed)
Recommendations from tele psych MD given to Dr. Hyman Hopes

## 2012-01-13 NOTE — ED Notes (Signed)
Pt brought in by [employer], [employer] stating "she was found on the neighbor's porch & demanding a pill & cigarette"; pt presents with tremors, is requesting a pill

## 2012-01-13 NOTE — ED Notes (Signed)
Dr.Penalver Psych will be calling in via tele psych

## 2012-01-13 NOTE — ED Notes (Signed)
Pt. Inventory: 1 pair of pj pants, 1 shirt, 1 bracelet, 1 yellow ring.  1 bag of belongings located in locker #4. Patient in blue scrubs and red socks. Patient and bags have both been wanded by security.

## 2012-01-13 NOTE — ED Notes (Signed)
ACT Crisis [phone] Doloris Hall (care giver) [phone].  Reports that he has pt's house keys and her purse.

## 2012-01-13 NOTE — ED Notes (Signed)
Pt released into care of ACT team and Doloris Hall, caregiver.

## 2012-01-13 NOTE — ED Notes (Signed)
Pt. states she does not need to void. Multiple attempts were made. RN Consuella Lose notified.

## 2012-01-13 NOTE — ED Notes (Signed)
Pt. Rebekah Pace a urine sample next time she goes to bathroom.

## 2012-01-13 NOTE — ED Notes (Signed)
Pt is calm and cooperative at this time.  Pt denies any pain.  Pt is escorted by [employer].

## 2012-01-13 NOTE — ED Notes (Signed)
Pt placed in paper scrubs, wanded by Security, [employer] remains @ BS.

## 2012-01-13 NOTE — ED Notes (Signed)
Pt's mother Olivene Cookston (mother) [phone]

## 2012-01-13 NOTE — ED Provider Notes (Signed)
History     CSN: [id number]  Arrival date & time 01/13/12  0902   First MD Initiated Contact with Patient 01/13/12 [phone]      Chief Complaint  Patient presents with  . Medical Clearance    (Consider location/radiation/quality/duration/timing/severity/associated sxs/prior treatment) HPI  H/o schizoaffective d/o 213 emergency department from home after she was found on the porch landing upon a cigarette. The patient states that "I need my pill and I need a cigarette". Specifically she is requesting trazodone. She states she does not know her other medications. She does state that she has trazodone at home he cannot explain why she cannot use her home trazodone. Denies SI/HI/AVH. According to police officers who transports her her to the emergency department she is disheveled on their arrival they also asking them for "a pill". He states that the inside of her home was "atrocious and unlivable".   ED Notes, ED Provider Notes from 01/13/12 0000 to 01/13/12 09:13:23       Dalbert Batman Reagan, RN 01/13/2012 09:11      [place] [employer] [employer] stating "she was found on the neighbor's porch & demanding a pill & cigarette"; pt presents with tremors, is requesting a pill    Past Medical History  Diagnosis Date  . Thyroid disease   . Anxiety   . Mental disorder   . Depression   . Hepatitis   . Hypothyroidism     Past Surgical History  Procedure Date  . Arm fracture   . Rotator cuff repair     No family history on file.  History  Substance Use Topics  . Smoking status: Never Smoker   . Smokeless tobacco: Never Used  . Alcohol Use: Yes    OB History    Grav Para Term Preterm Abortions TAB SAB Ect Mult Living                  Review of Systems  All other systems reviewed and are negative.  except as noted HPI   Allergies  Sulfa antibiotics  Home Medications   Current Outpatient Rx  Name Route Sig Dispense Refill  . CYANOCOBALAMIN 100 MCG PO TABS Oral Take 100 mcg by  mouth daily. B12 supplement forgot to put it in the bag, got last in the hosp    . LEVOTHYROXINE SODIUM 75 MCG PO TABS Oral Take 1 tablet (75 mcg total) by mouth daily. Thyroid supplement. 30 tablet 0  . TRAZODONE HCL 100 MG PO TABS Oral Take 1 tablet (100 mg total) by mouth at bedtime. For sleep. 30 tablet 0  . ZIPRASIDONE HCL 80 MG PO CAPS Oral Take 80 mg by mouth 2 (two) times daily with a meal. For mood stability and clear thoughts.      BP 112/57  Pulse 92  Temp(Src) 98.6 F (37 C) (Oral)  Resp 18  Wt 135 lb 3.2 oz (61.326 kg)  SpO2 98%   Physical Exam  Nursing note and vitals reviewed. Constitutional: She is oriented to person, place, and time. She appears well-developed.  HENT:  Head: Atraumatic.  Mouth/Throat: Oropharynx is clear and moist.  Eyes: Conjunctivae and EOM are normal. Pupils are equal, round, and reactive to light.  Neck: Normal range of motion. Neck supple.  Cardiovascular: Normal rate, regular rhythm, normal heart sounds and intact distal pulses.   Pulmonary/Chest: Effort normal and breath sounds normal. No respiratory distress. She has no wheezes. She has no rales.  Abdominal: Soft. She exhibits  no distension. There is no tenderness. There is no rebound and no guarding.  Musculoskeletal: Normal range of motion.  Neurological: She is alert and oriented to person, place, and time. No cranial nerve deficit. She exhibits normal muscle tone. Coordination normal.       Mild tremors  Skin: Skin is warm and dry. No rash noted.  Psychiatric:       Flat affect    ED Course  Procedures (including critical care time)  Labs Reviewed  URINALYSIS, ROUTINE W REFLEX MICROSCOPIC - Abnormal; Notable for the following:    APPearance CLOUDY (*)    Leukocytes, UA MODERATE (*)    All other components within normal limits  COMPREHENSIVE METABOLIC PANEL - Abnormal; Notable for the following:    Glucose, Bld 115 (*)    GFR calc non Af Amer 74 (*)    GFR calc Af Amer 86 (*)      All other components within normal limits  URINE MICROSCOPIC-ADD ON - Abnormal; Notable for the following:    Squamous Epithelial / LPF FEW (*)    Bacteria, UA FEW (*)    All other components within normal limits  ETHANOL  CBC  DIFFERENTIAL  URINE RAPID DRUG SCREEN (HOSP PERFORMED)  TSH   No results found.   1. Bipolar disorder    MDM   Is disheveled with apparent psychosis although according to act as well feels like she is at her baseline level functioning. She is alert and oriented and has a fair amount of insight. She is here quite frequently. I would like evaluation. They're recommending discharge home with outpatient community health followup. Discussed with act team as well as Child psychotherapist. The patient does not qualify for placement as she is competent, alert and oriented. She is refusing placement in her home. The patient does have outpatient resources. She is acting come to her house 5 days a week to help with medications and she has good outpatient followup at [facility] She has refills of her medications including Geodon and trazodone which telepsych recommends to continue. Tachycardia have resolved without intervention. Her urine sample was contaminated. Stable condition PSI to transport home  BP 112/57  Pulse 92  Temp(Src) 98.6 F (37 C) (Oral)  Resp 18  Wt 135 lb 3.2 oz (61.326 kg)  SpO2 98%        Forbes Cellar, MD 01/13/12 1619

## 2012-01-14 ENCOUNTER — Emergency Department (HOSPITAL_COMMUNITY)
Admission: EM | Admit: 2012-01-14 | Discharge: 2012-01-14 | Disposition: A | Discharge status: Home / Self Care | Payer: Medicaid Other | Attending: Emergency Medicine | Admitting: Emergency Medicine

## 2012-01-14 DIAGNOSIS — F22 Delusional disorders: Secondary | ICD-10-CM | POA: Insufficient documentation

## 2012-01-14 DIAGNOSIS — E039 Hypothyroidism, unspecified: Secondary | ICD-10-CM | POA: Insufficient documentation

## 2012-01-14 DIAGNOSIS — F319 Bipolar disorder, unspecified: Secondary | ICD-10-CM | POA: Insufficient documentation

## 2012-01-14 DIAGNOSIS — F250 Schizoaffective disorder, bipolar type: Secondary | ICD-10-CM

## 2012-01-14 DIAGNOSIS — F259 Schizoaffective disorder, unspecified: Secondary | ICD-10-CM | POA: Insufficient documentation

## 2012-01-14 LAB — CBC
Hemoglobin: 12.5 g/dL (ref 12.0–15.0)
MCH: 28.9 pg (ref 26.0–34.0)
MCHC: 33.3 g/dL (ref 30.0–36.0)
MCV: 86.8 fL (ref 78.0–100.0)
RBC: 4.32 MIL/uL (ref 3.87–5.11)

## 2012-01-14 LAB — COMPREHENSIVE METABOLIC PANEL
ALT: 25 U/L (ref 0–35)
BUN: 12 mg/dL (ref 6–23)
CO2: 24 mEq/L (ref 19–32)
Calcium: 9.2 mg/dL (ref 8.4–10.5)
Creatinine, Ser: 0.71 mg/dL (ref 0.50–1.10)
GFR calc Af Amer: >90 mL/min (ref >90)
GFR calc non Af Amer: >90 mL/min (ref >90)
Glucose, Bld: 115 mg/dL — ABNORMAL HIGH (ref 70–99)

## 2012-01-14 LAB — DIFFERENTIAL
Basophils Relative: 1 % (ref 0–1)
Eosinophils Absolute: 0.4 10*3/uL (ref 0.0–0.7)
Eosinophils Relative: 4 % (ref 0–5)
Lymphs Abs: 2.1 10*3/uL (ref 0.7–4.0)
Monocytes Absolute: 0.5 10*3/uL (ref 0.1–1.0)
Monocytes Relative: 7 % (ref 3–12)

## 2012-01-14 MED ORDER — LORAZEPAM 1 MG PO TABS: 1.0000 mg | ORAL_TABLET | Freq: Three times a day (TID) | ORAL | Status: DC | PRN

## 2012-01-14 NOTE — ED Provider Notes (Signed)
History     CSN: [id number]  Arrival date & time 01/14/12  1735   First MD Initiated Contact with Patient 01/14/12 1922      No chief complaint on file.   (Consider location/radiation/quality/duration/timing/severity/associated sxs/prior treatment) HPI Comments: Patient's boyfriend brought her to the hospital today because he was concerned about her behavior.  Patient has a history of Bipolar disorder and Schizophrenia.  He reports that the cops were called by the neighbor today after the patient went to their home to ask for a cigarette.  This has been an ongoing issue and she has been told multiple times not to go to the neighbors.  Boyfriend is also concerned about the patient's ability to care for herself.  He reports that her house is a mess and that she is unable to sleep at night, even after taking two trazadone.  Patient was seen in the ED yesterday for the same issue and was evaluated by ACT team and discharged home.  She has been seen in the ED numerous times for the same complaint.  Patient has ACT team come into her home daily to give her the psychiatric medication.  Patient denies SI or HI.  She reports that she is feeling anxious, but denies any depression.  The history is provided by the patient (patient's boyfriend).    Past Medical History  Diagnosis Date  . Thyroid disease   . Anxiety   . Mental disorder   . Depression   . Hepatitis   . Hypothyroidism     Past Surgical History  Procedure Date  . Arm fracture   . Rotator cuff repair     No family history on file.  History  Substance Use Topics  . Smoking status: Never Smoker   . Smokeless tobacco: Never Used  . Alcohol Use: Yes    OB History    Grav Para Term Preterm Abortions TAB SAB Ect Mult Living                  Review of Systems  Constitutional: Negative for fever and chills.  Eyes: Negative for visual disturbance.  Respiratory: Negative for shortness of breath.   Gastrointestinal: Negative  for nausea and vomiting.  Psychiatric/Behavioral: Positive for sleep disturbance. Negative for suicidal ideas, hallucinations, confusion and dysphoric mood. The patient is nervous/anxious.     Allergies  Sulfa antibiotics  Home Medications   Current Outpatient Rx  Name Route Sig Dispense Refill  . CYANOCOBALAMIN 100 MCG PO TABS Oral Take 100 mcg by mouth daily. B12 supplement forgot to put it in the bag, got last in the hosp    . LEVOTHYROXINE SODIUM 75 MCG PO TABS Oral Take 1 tablet (75 mcg total) by mouth daily. Thyroid supplement. 30 tablet 0  . TRAZODONE HCL 100 MG PO TABS Oral Take 1 tablet (100 mg total) by mouth at bedtime. For sleep. 30 tablet 0  . ZIPRASIDONE HCL 80 MG PO CAPS Oral Take 80 mg by mouth 2 (two) times daily with a meal. For mood stability and clear thoughts.      BP 116/67  Pulse 92  Temp(Src) 98.5 F (36.9 C) (Oral)  Physical Exam  Nursing note and vitals reviewed. Constitutional: She is oriented to person, place, and time. She appears well-developed and well-nourished. No distress.       Disheveled appearing  HENT:  Head: Normocephalic and atraumatic.  Mouth/Throat: Oropharynx is clear and moist.  Eyes: EOM are normal. Pupils are equal, round, and  reactive to light.  Neck: Normal range of motion. Neck supple.  Cardiovascular: Normal rate, regular rhythm and normal heart sounds.   Pulmonary/Chest: Effort normal and breath sounds normal. No respiratory distress.  Abdominal: Soft. There is no tenderness.  Neurological: She is alert and oriented to person, place, and time.  Skin: Skin is warm and dry. She is not diaphoretic.  Psychiatric: Her mood appears anxious. She is agitated. Thought content is not paranoid and not delusional. She expresses no homicidal and no suicidal ideation. She expresses no suicidal plans and no homicidal plans.    ED Course  Procedures (including critical care time)   Labs Reviewed  CBC  DIFFERENTIAL  URINE RAPID DRUG  SCREEN (HOSP PERFORMED)  ETHANOL  COMPREHENSIVE METABOLIC PANEL   No results found.   No diagnosis found.  Patient discussed with ACT team who will evaluate patient and determine placement.  Telepsych consult will also be performed.    MDM  Patient brought in by bf due to inability to sleep, having cops called by neighbors, and also inability to take care of her home.  Patient has ACT team come in to the home daily to administer psych meds.  Telepsych consult performed and psychiatrist recommended patient be discharged home with [facility] follow up.  Pt denies SI or HI.        Pascal Lux [place], PA-C 01/15/12 [phone]

## 2012-01-14 NOTE — ED Notes (Signed)
Pt refused to give urine sample on this trip to the [place].

## 2012-01-14 NOTE — ED Notes (Addendum)
Pt. Belonging is with husband

## 2012-01-14 NOTE — ED Notes (Signed)
Request for telepsych filled out, faxed and called in to [facility].

## 2012-01-14 NOTE — ED Notes (Signed)
Pt had telepsych, denied SI/HI and A/V hallucinations. Rec is to D/C home with community ACT team f/u.Pt at baseline of functioning. Pt called her boyfriend to pick her up upon d/c, but now refuses to get OOB to sign D/C instructions. MD aware.

## 2012-01-14 NOTE — ED Notes (Signed)
When asked why pt has come to the hospital, she states "because I was very, very nervous earlier today"  Pt denies SI/HI.  Pt is axox3.  She is currently calm and cooperative.

## 2012-01-14 NOTE — ED Notes (Signed)
Pt asked if she wanted to change into her own clothes that BF brought with him. Pt denies, stating she does not want to change clothes. Pt discharged to boyfriend per her request. Pt required a walk-beside escort out of unit due to not wanting to leave. Calm and cooperative and no longer aggressive. Pleased to see BF in lobby.

## 2012-01-14 NOTE — ED Provider Notes (Signed)
Evaluated by tele-psychiatry, specialist on call. The physician was Dr. Berlin Hun. They feel the patient is safe for discharge to home with followup with community mental health. No medication changes. She'll be discharged home and provided the resource guide   Dayton Bailiff, MD 01/14/12 2235

## 2012-01-14 NOTE — Discharge Instructions (Signed)
Schizophrenia Schizophrenia is a serious mental illness. There is disturbed and disorganized thinking, language, and behavior. Patients may see, hear, or feel things that are not really there. Sometimes speech is incoherent and there are multiple problems with day to day living. Schizophrenia should not be confused with multiple personality disorder (now called dissociative identity disorder) in which a person has at least two distinct personalities. About 1% of people have schizophrenia in their lifetimes. It affects men and women equally. CAUSES  There are many theories about the cause of schizophrenia. The genes a person inherits from his or her parents may be partially responsible. Stress in a person's environment can trigger episodes. A person may have functioned normally for years and then have an acute (sudden) psychotic episode caused by stress. Some scientists believe that something might happen before birth, such as a viral infection in the womb, that causes schizophrenic symptoms (problems) decades later. Special scans, such as PET (positron-emission tomography) have been used to look at the brains of people with this illness. These pictures show that some parts of the brain seem to have metabolic or chemical abnormalities. Lab studies have shown that nerve cells in some parts of the brains of schizophrenics may be uneven or damaged. Another possible cause is that chemicals carrying signals between nerve cells may not be working. Schizophrenia does not appear to be caused by family problems. Stress does appear to make things worse for people with this illness. SYMPTOMS No single symptom defines this condition. Important signs are:  Hallucinations (hearing voices or seeing things that are not there to hear or see).   Dressing inappropriately.   Neglecting personal hygiene and grooming.   Withdrawing from social contacts and not speaking to anybody (autism).   Inability to understand what a  schizophrenic is saying.   A growing distrust of people without good cause.   Being very bland or blunted emotionally (flat affect).  TYPES OF SCHIZOPHRENIA  Paranoid schizophrenia involves delusional thoughts. The patient believes people around them are against them and plotting against them.   In grandiose schizophrenia the patient may feel that he is God, or the President, etc.   Disorganized schizophrenia involves symptoms of disorganized speech.  TREATMENT  Medications are the most important part of the treatment of schizophrenia. Many medications are available that can relieve symptoms. It is often helpful if these can be administered by a more trusted family member because the patient may sometimes think they are being poisoned. It is important that the medications be given regularly even when the patient seems to be doing well. Do not stop giving medications without instruction by a caregiver. This could lead to a relapse. Hospitalization may sometimes be necessary if symptoms cannot be controlled with medications. Schizophrenia may be lifelong. However, periods of illness may be inter spaced with long periods of normality. Medications can greatly improve the quality of life. Non prescribed drugs and alcohol should be avoided.  Assistance is available for care. The [employer] is an organization of family members of people with severe mental illness. They direct families and patients to support groups, education, and advocacy programs for additional help. [employer] for the [employer]ally Ill) toll-free help line number is 800/950-NAMI, or 304-578-0806. Document Released: 09/27/2000 Document Revised: 09/19/2011 Document Reviewed: 09/30/2005 [facility] Patient Information 2012 [place], [place].  RESOURCE GUIDE  Dental Problems  Patients with Medicaid: [facility]  [facility] [address]                                            [phone] [address] [employer] Phone:  [phone]                                                  Phone:  [phone]  If unable to pay or uninsured, contact:  [facility] or [facility]. to become qualified for the adult dental clinic.  Chronic Pain Problems Contact [facility]  [phone] Patients need to be referred by their primary care doctor.  Insufficient Money for Medicine Contact [employer]:  call "211" or [facility] 920-325-9251.  No Primary Care Doctor Call [facility]  9852368064 Other agencies that provide inexpensive medical care    [facility]  [phone]    [facility]  [phone]    [facility]  4175333851    [facility]  [phone]    [facility]  [phone]    [facility]  [phone]  Psychological Services [facility]  [phone] [facility]  [phone] [facility]   [phone] (emergency services [phone])  Substance Abuse Resources [facility]  ([phone] [facility] [phone] [facility] [place] [phone] Floydene Flock 8645571035 Residential & Outpatient Substance Abuse Program  671-374-7567  Abuse/Neglect [facility] Child Abuse Hotline (539)381-9697 [facility] Child Abuse Hotline 9072016531 (After Hours)  Emergency Shelter [facility] 346-668-4450  Maternity Homes [facility] at [facility] [place] [facility] 778-250-6282 [facility] [phone]  MRSA Hotline #:   (606)146-0556    [facility] Resources  [facility]     [employer]                          [facility] Dept. [address] [place]                       [address]      [address]  Blondell Reveal Phone:  [phone]                                    Phone:  [phone]                 Phone:  [phone]  [facility] Phone:  [phone]  [facility] Child Abuse Hotline (814)740-2555 [phone] (After Hours)

## 2012-01-15 NOTE — ED Provider Notes (Signed)
Medical screening examination/treatment/procedure(s) were performed by non-physician practitioner and as supervising physician I was immediately available for consultation/collaboration.   Dayton Bailiff, MD 01/15/12 [phone]

## 2012-01-18 ENCOUNTER — Emergency Department (HOSPITAL_COMMUNITY)
Admission: EM | Admit: 2012-01-18 | Discharge: 2012-01-18 | Disposition: A | Discharge status: Home / Self Care | Payer: Medicaid Other | Attending: Emergency Medicine | Admitting: Emergency Medicine

## 2012-01-18 ENCOUNTER — Encounter (HOSPITAL_COMMUNITY): Payer: Self-pay

## 2012-01-18 DIAGNOSIS — R109 Unspecified abdominal pain: Secondary | ICD-10-CM | POA: Insufficient documentation

## 2012-01-18 DIAGNOSIS — E039 Hypothyroidism, unspecified: Secondary | ICD-10-CM | POA: Insufficient documentation

## 2012-01-18 DIAGNOSIS — R350 Frequency of micturition: Secondary | ICD-10-CM | POA: Insufficient documentation

## 2012-01-18 DIAGNOSIS — M549 Dorsalgia, unspecified: Secondary | ICD-10-CM

## 2012-01-18 DIAGNOSIS — F329 Major depressive disorder, single episode, unspecified: Secondary | ICD-10-CM | POA: Insufficient documentation

## 2012-01-18 DIAGNOSIS — E079 Disorder of thyroid, unspecified: Secondary | ICD-10-CM | POA: Insufficient documentation

## 2012-01-18 DIAGNOSIS — R110 Nausea: Secondary | ICD-10-CM | POA: Insufficient documentation

## 2012-01-18 DIAGNOSIS — F411 Generalized anxiety disorder: Secondary | ICD-10-CM | POA: Insufficient documentation

## 2012-01-18 DIAGNOSIS — F3289 Other specified depressive episodes: Secondary | ICD-10-CM | POA: Insufficient documentation

## 2012-01-18 LAB — DIFFERENTIAL
Basophils Absolute: 0.1 10*3/uL (ref 0.0–0.1)
Lymphocytes Relative: 32 % (ref 12–46)
Lymphs Abs: 2.1 10*3/uL (ref 0.7–4.0)
Neutro Abs: 3.5 10*3/uL (ref 1.7–7.7)
Neutrophils Relative %: 54 % (ref 43–77)

## 2012-01-18 LAB — URINALYSIS, ROUTINE W REFLEX MICROSCOPIC
Bilirubin Urine: NEGATIVE
Nitrite: NEGATIVE
Protein, ur: NEGATIVE mg/dL
Specific Gravity, Urine: 1.006 (ref 1.005–1.030)
Urobilinogen, UA: 0.2 mg/dL (ref 0.0–1.0)

## 2012-01-18 LAB — POCT I-STAT, CHEM 8
BUN: 11 mg/dL (ref 6–23)
Chloride: 105 mEq/L (ref 96–112)
HCT: 36 % (ref 36.0–46.0)
Sodium: 141 mEq/L (ref 135–145)

## 2012-01-18 LAB — PREGNANCY, URINE: Preg Test, Ur: NEGATIVE

## 2012-01-18 LAB — CBC
Platelets: 246 10*3/uL (ref 150–400)
RBC: 4.16 MIL/uL (ref 3.87–5.11)
WBC: 6.5 10*3/uL (ref 4.0–10.5)

## 2012-01-18 LAB — URINE MICROSCOPIC-ADD ON

## 2012-01-18 MED ORDER — DICLOFENAC SODIUM 75 MG PO TBEC: 75.0000 mg | DELAYED_RELEASE_TABLET | Freq: Two times a day (BID) | ORAL | Status: DC

## 2012-01-18 MED ORDER — PHENAZOPYRIDINE HCL 200 MG PO TABS: 200.0000 mg | ORAL_TABLET | Freq: Three times a day (TID) | ORAL | Status: AC

## 2012-01-18 NOTE — ED Notes (Signed)
Pt in from home with uti sx x2 weeks states frequency and urgency states back pain

## 2012-01-18 NOTE — Discharge Instructions (Signed)
Back Pain, Adult Low back pain is very common. About 1 in 5 people have back pain.The cause of low back pain is rarely dangerous. The pain often gets better over time.About half of people with a sudden onset of back pain feel better in just 2 weeks. About 8 in 10 people feel better by 6 weeks.  CAUSES Some common causes of back pain include:  Strain of the muscles or ligaments supporting the spine.   Wear and tear (degeneration) of the spinal discs.   Arthritis.   Direct injury to the back.  DIAGNOSIS Most of the time, the direct cause of low back pain is not known.However, back pain can be treated effectively even when the exact cause of the pain is unknown.Answering your caregiver's questions about your overall health and symptoms is one of the most accurate ways to make sure the cause of your pain is not dangerous. If your caregiver needs more information, he or she may order lab work or imaging tests (X-rays or MRIs).However, even if imaging tests show changes in your back, this usually does not require surgery. HOME CARE INSTRUCTIONS For many people, back pain returns.Since low back pain is rarely dangerous, it is often a condition that people can learn to manageon their own.   Remain active. It is stressful on the back to sit or stand in one place. Do not sit, drive, or stand in one place for more than 30 minutes at a time. Take short walks on level surfaces as soon as pain allows.Try to increase the length of time you walk each day.   Do not stay in bed.Resting more than 1 or 2 days can delay your recovery.   Do not avoid exercise or work.Your body is made to move.It is not dangerous to be active, even though your back may hurt.Your back will likely heal faster if you return to being active before your pain is gone.   Pay attention to your body when you bend and lift. Many people have less discomfortwhen lifting if they bend their knees, keep the load close to their  bodies,and avoid twisting. Often, the most comfortable positions are those that put less stress on your recovering back.   Find a comfortable position to sleep. Use a firm mattress and lie on your side with your knees slightly bent. If you lie on your back, put a pillow under your knees.   Only take over-the-counter or prescription medicines as directed by your caregiver. Over-the-counter medicines to reduce pain and inflammation are often the most helpful.Your caregiver may prescribe muscle relaxant drugs.These medicines help dull your pain so you can more quickly return to your normal activities and healthy exercise.   Put ice on the injured area.   Put ice in a plastic bag.   Place a towel between your skin and the bag.   Leave the ice on for 15 to 20 minutes, 3 to 4 times a day for the first 2 to 3 days. After that, ice and heat may be alternated to reduce pain and spasms.   Ask your caregiver about trying back exercises and gentle massage. This may be of some benefit.   Avoid feeling anxious or stressed.Stress increases muscle tension and can worsen back pain.It is important to recognize when you are anxious or stressed and learn ways to manage it.Exercise is a great option.  SEEK MEDICAL CARE IF:  You have pain that is not relieved with rest or medicine.   You have   pain that does not improve in 1 week.   You have new symptoms.   You are generally not feeling well.  SEEK IMMEDIATE MEDICAL CARE IF:   You have pain that radiates from your back into your legs.   You develop new bowel or bladder control problems.   You have unusual weakness or numbness in your arms or legs.   You develop nausea or vomiting.   You develop abdominal pain.   You feel faint.  Document Released: 09/30/2005 Document Revised: 09/19/2011 Document Reviewed: 02/18/2011 [facility] Patient Information 2012 [place], [place].   Urinary Frequency The number of times a normal person urinates depends  upon how much liquid they take in and how much liquid they are losing. If the temperature is hot and there is high humidity then the person will sweat more and usually breathe a little more frequently. These factors decrease the amount of frequency of urination that would be considered normal. The amount you drink is easily determined, but the amount of fluid lost is sometimes more difficult to calculate.  Fluid is lost in two ways:  Sensible fluid loss is usually measured by the amount of urine that you get rid of. Losses of fluid can also occur with diarrhea.   Insensible fluid loss is more difficult to measure. It is caused by evaporation. Insensible loss of fluid occurs through breathing and sweating. It usually ranges from a little less than a quart to a little more than a quart of fluid a day.  In normal temperatures and activity levels the average person may urinate 4 to 7 times in a 24-hour period. Needing to urinate more often than that could indicate a problem. If one urinates 4 to 7 times in 24 hours and has large volumes each time, that could indicate a different problem from one who urinates 4 to 7 times a day and has small volumes. The time of urinating is also an important. Most urinating should be done during the waking hours. Getting up at night to urinate frequently can indicate some problems. CAUSES  The bladder is the organ in your lower abdomen that holds urine. Like a balloon, it swells some as it fills up. Your nerves sense this and tell you it is time to head for the bathroom. There are a number of reasons that you might feel the need to urinate more often than usual. They include:  Urinary tract infection. This is usually associated with other signs such as burning when you urinate.   In men, problems with the prostate (a walnut-size gland that is located near the tube that carries urine out of your body). There are two reasons why the prostate can cause an increased frequency  of urination:   An enlarged prostate that does not let the bladder empty well. If the bladder only half empties when you urinate then it only has half the capacity to fill before you have to urinate again.   The nerves in the bladder become more hypersensitive with an increased size of the prostate even if the bladder empties completely.   Pregnancy.   Obesity. Excess weight is more likely to cause a problem for women more than for men.   Bladder stones or other bladder problems.   Caffeine.   Alcohol.   Medications. For example, drugs that help the body get rid of extra fluid (diuretics) increase urine production. Some other medicines must be taken with lots of fluids.   Muscle or nerve weakness. This  might be the result of a spinal cord injury, a stroke, multiple sclerosis or Parkinson's disease.   Long-standing diabetes can decrease the sensation of the bladder. This loss of sensation makes it harder to sense the bladder needs to be emptied. Over a period of years the bladder is stretched out by constant overfilling. This weakens the bladder muscles so that the bladder does not empty well and has less capacity to fill with new urine.   Interstitial cystitis (also called painful bladder syndrome). This condition develops because the tissues that line the insider of the bladder are inflamed (inflammation is the body's way of reacting to injury or infection). It causes pain and frequent urination. It occurs in women more often than in men.  DIAGNOSIS   To decide what might be causing your urinary frequency, your healthcare provider will probably:   Ask about symptoms you have noticed.   Ask about your overall health. This will include questions about any medications you are taking.   Do a physical examination.   Order some tests. These might include:   A blood test to check for diabetes or other health issues that could be contributing to the problem.   Urine testing. This could  measure the flow of urine and the pressure on the bladder.   A test of your neurological system (the brain, spinal cord and nerves). This is the system that senses the need to urinate.   A bladder test to check whether it is emptying completely when you urinate.   Cytoscopy. This test uses a thin tube with a tiny camera on it. It offers a look inside your urethra and bladder to see if there are problems.   Imaging tests. You might be given a contrast dye and then asked to urinate. X-rays are taken to see how your bladder is working.  TREATMENT  It is important for you to be evaluated to determine if the amount or frequency that you have is unusual or abnormal. If it is found to be abnormal the cause should be determined and this can usually be found out easily. Depending upon the cause treatment could include medication, stimulation of the nerves, or surgery. There are not too many things that you can do as an individual to change your urinary frequency. It is important that you balance the amount of fluid intake needed to compensate for your activity and the temperature. Medical problems will be diagnosed and taken care of by your physician. There is no particular bladder training such as Kegel's exercises that you can do to help urinary frequency. This is an exercise this is usually done for people who have leaking of urine when they laugh cough or sneeze. HOME CARE INSTRUCTIONS   Take any medications your healthcare provider prescribed or suggested. Follow the directions carefully.   Practice any lifestyle changes that are recommended. These might include:   Drinking less fluid or drinking at different times of the day. If you need to urinate often during the night, for example, you may need to stop drinking fluids early in the evening.   Cutting down on caffeine or alcohol. They both can make you need to urinate more often than normal. Caffeine is found in coffee, tea and sodas.   Losing  weight, if that is recommended.   Keep a journal or a log. You might be asked to record how much you drink and when and when you feel the need to urinate. This will also help evaluate  how well the treatment provided by your physician is working.  SEEK MEDICAL CARE IF:   Your need to urinate often gets worse.   You feel increased pain or irritation when you urinate.   You notice blood in your urine.   You have questions about any medications that your healthcare provider recommended.   You notice blood, pus or swelling at the site of any test or treatment procedure.   You develop a fever of more than 100.5 F (38.1 C).  SEEK IMMEDIATE MEDICAL CARE IF:  You develop a fever of more than 102.0 F (38.9 C). Document Released: 07/27/2009 Document Revised: 09/19/2011 Document Reviewed: 07/27/2009 [facility] Patient Information 2012 [place], [place].

## 2012-01-18 NOTE — ED Provider Notes (Signed)
History     CSN: [id number]  Arrival date & time 01/18/12  1118   First MD Initiated Contact with Patient 01/18/12 1132     12:24 HPI Pt has mild mental and behavioral disability. Is cared for by her mother. Reports symptoms of urinary frequency, and urgency. States she also began having back pain. Mother reports several times she has not been able to make it to the restroom in time. Pt states he back has been hurting on the right lower side. Also associated with nausea. Denies fever, dysuria, hematuria, vaginal symptoms.  Patient is a 56 y.o. female presenting with frequency. The history is provided by the patient and a parent.  Urinary Frequency This is a new problem. Episode onset: 2 weeks. The problem has been gradually worsening. Associated symptoms include abdominal pain, nausea and urinary symptoms (frequency). Pertinent negatives include no chest pain, fever, numbness, vomiting or weakness. She has tried nothing for the symptoms.    Past Medical History  Diagnosis Date  . Thyroid disease   . Anxiety   . Mental disorder   . Depression   . Hepatitis   . Hypothyroidism     Past Surgical History  Procedure Date  . Arm fracture   . Rotator cuff repair     No family history on file.  History  Substance Use Topics  . Smoking status: Never Smoker   . Smokeless tobacco: Never Used  . Alcohol Use: Yes    OB History    Grav Para Term Preterm Abortions TAB SAB Ect Mult Living                  Review of Systems  Constitutional: Negative for fever.  Cardiovascular: Negative for chest pain.  Gastrointestinal: Positive for nausea and abdominal pain. Negative for vomiting.  Genitourinary: Positive for urgency and frequency. Negative for dysuria, hematuria, vaginal bleeding, vaginal discharge, vaginal pain and pelvic pain.  Musculoskeletal: Positive for back pain.  Neurological: Negative for dizziness, weakness, light-headedness and numbness.  All other systems reviewed and  are negative.    Allergies  Sulfa antibiotics  Home Medications   Current Outpatient Rx  Name Route Sig Dispense Refill  . CYANOCOBALAMIN 100 MCG PO TABS Oral Take 100 mcg by mouth daily.     Marland Kitchen LEVOTHYROXINE SODIUM 75 MCG PO TABS Oral Take 75 mcg by mouth daily.    Marland Kitchen OLANZAPINE 20 MG PO TABS Oral Take 20 mg by mouth at bedtime.    . TRAZODONE HCL 100 MG PO TABS Oral Take 100 mg by mouth at bedtime.    Marland Kitchen ZIPRASIDONE HCL 80 MG PO CAPS Oral Take 80 mg by mouth 2 (two) times daily with a meal.       BP 136/73  Pulse 88  Temp(Src) 98.9 F (37.2 C) (Oral)  Resp 18  SpO2 100%  Physical Exam  Vitals reviewed. Constitutional: She is oriented to person, place, and time. Vital signs are normal. She appears well-developed and well-nourished.  HENT:  Head: Normocephalic and atraumatic.  Eyes: Conjunctivae are normal. Pupils are equal, round, and reactive to light.  Neck: Normal range of motion. Neck supple.  Cardiovascular: Normal rate, regular rhythm and normal heart sounds.  Exam reveals no friction rub.   No murmur heard. Pulmonary/Chest: Effort normal and breath sounds normal. She has no wheezes. She has no rhonchi. She has no rales. She exhibits no tenderness.  Abdominal: Soft. Bowel sounds are normal. She exhibits no distension and no mass. There  is no tenderness. There is no rebound, no guarding and no CVA tenderness.  Musculoskeletal: Normal range of motion.       Thoracic back: She exhibits no bony tenderness.       Lumbar back: She exhibits no bony tenderness.  Neurological: She is alert and oriented to person, place, and time. Coordination normal.  Skin: Skin is warm and dry. No rash noted. No erythema. No pallor.  Psychiatric:       Flat affect    ED Course  Procedures  Results for orders placed during the hospital encounter of 01/18/12  URINALYSIS, ROUTINE W REFLEX MICROSCOPIC      Component Value Range   Color, Urine YELLOW  YELLOW    APPearance CLEAR  CLEAR     Specific Gravity, Urine 1.006  1.005 - 1.030    pH 7.0  5.0 - 8.0    Glucose, UA NEGATIVE  NEGATIVE (mg/dL)   Hgb urine dipstick NEGATIVE  NEGATIVE    Bilirubin Urine NEGATIVE  NEGATIVE    Ketones, ur NEGATIVE  NEGATIVE (mg/dL)   Protein, ur NEGATIVE  NEGATIVE (mg/dL)   Urobilinogen, UA 0.2  0.0 - 1.0 (mg/dL)   Nitrite NEGATIVE  NEGATIVE    Leukocytes, UA TRACE (*) NEGATIVE   URINE MICROSCOPIC-ADD ON      Component Value Range   Squamous Epithelial / LPF RARE  RARE    WBC, UA 0-2  <3 (WBC/hpf)  CBC      Component Value Range   WBC 6.5  4.0 - 10.5 (K/uL)   RBC 4.16  3.87 - 5.11 (MIL/uL)   Hemoglobin 12.3  12.0 - 15.0 (g/dL)   HCT 57.8  46.9 - 62.9 (%)   MCV 86.8  78.0 - 100.0 (fL)   MCH 29.6  26.0 - 34.0 (pg)   MCHC 34.1  30.0 - 36.0 (g/dL)   RDW 52.8  41.3 - 24.4 (%)   Platelets 246  150 - 400 (K/uL)  DIFFERENTIAL      Component Value Range   Neutrophils Relative 54  43 - 77 (%)   Neutro Abs 3.5  1.7 - 7.7 (K/uL)   Lymphocytes Relative 32  12 - 46 (%)   Lymphs Abs 2.1  0.7 - 4.0 (K/uL)   Monocytes Relative 8  3 - 12 (%)   Monocytes Absolute 0.5  0.1 - 1.0 (K/uL)   Eosinophils Relative 5  0 - 5 (%)   Eosinophils Absolute 0.3  0.0 - 0.7 (K/uL)   Basophils Relative 1  0 - 1 (%)   Basophils Absolute 0.1  0.0 - 0.1 (K/uL)  PREGNANCY, URINE      Component Value Range   Preg Test, Ur NEGATIVE  NEGATIVE   POCT I-STAT, CHEM 8      Component Value Range   Sodium 141  135 - 145 (mEq/L)   Potassium 4.3  3.5 - 5.1 (mEq/L)   Chloride 105  96 - 112 (mEq/L)   BUN 11  6 - 23 (mg/dL)   Creatinine, Ser 0.10  0.50 - 1.10 (mg/dL)   Glucose, Bld 90  70 - 99 (mg/dL)   Calcium, Ion 2.72  5.36 - 1.32 (mmol/L)   TCO2 29  0 - 100 (mmol/L)   Hemoglobin 12.2  12.0 - 15.0 (g/dL)   HCT 64.4  03.4 - 74.2 (%)   No results found.   MDM  Labs are normal. Will Provide with referral to urology if symptoms continue. Will also give medication of  pain and urinary relief. Advised close f/u with her  pcp and psychiatrist. Mother and patient agree with plan and are ready for d/c. Ordered urine culture.       Thomasene Lot, PA-C 01/18/12 1357

## 2012-01-18 NOTE — ED Provider Notes (Signed)
Medical screening examination/treatment/procedure(s) were performed by non-physician practitioner and as supervising physician I was immediately available for consultation/collaboration.   Lyanne Co, MD 01/18/12 [phone]

## 2012-01-18 NOTE — ED Notes (Signed)
Patient incontinent of urine and laughing inappropriately afterwards

## 2012-01-18 NOTE — ED Notes (Signed)
Patient's mother had morning meds. EDPA said it ws OK for her to take. Patient's mouth checked after taking and no meds present in mouth.

## 2012-01-19 ENCOUNTER — Emergency Department (HOSPITAL_COMMUNITY)
Admission: EM | Admit: 2012-01-19 | Discharge: 2012-01-20 | Disposition: A | Discharge status: Home / Self Care | Payer: Medicaid Other | Attending: Emergency Medicine | Admitting: Emergency Medicine

## 2012-01-19 ENCOUNTER — Encounter (HOSPITAL_COMMUNITY): Payer: Self-pay | Admitting: *Deleted

## 2012-01-19 DIAGNOSIS — IMO0002 Reserved for concepts with insufficient information to code with codable children: Secondary | ICD-10-CM | POA: Insufficient documentation

## 2012-01-19 DIAGNOSIS — F22 Delusional disorders: Secondary | ICD-10-CM | POA: Insufficient documentation

## 2012-01-19 DIAGNOSIS — E079 Disorder of thyroid, unspecified: Secondary | ICD-10-CM | POA: Insufficient documentation

## 2012-01-19 DIAGNOSIS — F3289 Other specified depressive episodes: Secondary | ICD-10-CM | POA: Insufficient documentation

## 2012-01-19 DIAGNOSIS — F29 Unspecified psychosis not due to a substance or known physiological condition: Secondary | ICD-10-CM | POA: Insufficient documentation

## 2012-01-19 DIAGNOSIS — F419 Anxiety disorder, unspecified: Secondary | ICD-10-CM

## 2012-01-19 DIAGNOSIS — F329 Major depressive disorder, single episode, unspecified: Secondary | ICD-10-CM | POA: Insufficient documentation

## 2012-01-19 DIAGNOSIS — F411 Generalized anxiety disorder: Secondary | ICD-10-CM | POA: Insufficient documentation

## 2012-01-19 LAB — CBC
HCT: 39.2 % (ref 36.0–46.0)
MCV: 88.3 fL (ref 78.0–100.0)
Platelets: 255 10*3/uL (ref 150–400)
RBC: 4.44 MIL/uL (ref 3.87–5.11)
WBC: 7.4 10*3/uL (ref 4.0–10.5)

## 2012-01-19 LAB — COMPREHENSIVE METABOLIC PANEL
AST: 24 U/L (ref 0–37)
Alkaline Phosphatase: 82 U/L (ref 39–117)
BUN: 10 mg/dL (ref 6–23)
CO2: 27 mEq/L (ref 19–32)
Chloride: 98 mEq/L (ref 96–112)
Creatinine, Ser: 0.86 mg/dL (ref 0.50–1.10)
GFR calc non Af Amer: 74 mL/min — ABNORMAL LOW (ref >90)
Potassium: 3.7 mEq/L (ref 3.5–5.1)
Total Bilirubin: 0.4 mg/dL (ref 0.3–1.2)

## 2012-01-19 LAB — URINE CULTURE
Colony Count: NO GROWTH
Culture: NO GROWTH

## 2012-01-19 LAB — RAPID URINE DRUG SCREEN, HOSP PERFORMED
Amphetamines: NOT DETECTED
Opiates: NOT DETECTED
Tetrahydrocannabinol: NOT DETECTED

## 2012-01-19 MED ORDER — LORAZEPAM 1 MG PO TABS
1.0000 mg | ORAL_TABLET | Freq: Three times a day (TID) | ORAL | Status: DC | PRN
  Administered 2012-01-19 – 2012-01-20 (×2): 1 mg via ORAL
  Filled 2012-01-19 (×2): qty 1

## 2012-01-19 MED ORDER — ACETAMINOPHEN 325 MG PO TABS: 650.0000 mg | ORAL_TABLET | ORAL | Status: DC | PRN

## 2012-01-19 MED ORDER — ZIPRASIDONE MESYLATE 20 MG IM SOLR
20.0000 mg | Freq: Once | INTRAMUSCULAR | Status: AC
  Administered 2012-01-19: 20 mg via INTRAMUSCULAR
  Filled 2012-01-19: qty 20

## 2012-01-19 MED ORDER — ONDANSETRON HCL 4 MG PO TABS: 4.0000 mg | ORAL_TABLET | Freq: Three times a day (TID) | ORAL | Status: DC | PRN

## 2012-01-19 MED ORDER — IBUPROFEN 600 MG PO TABS
600.0000 mg | ORAL_TABLET | Freq: Three times a day (TID) | ORAL | Status: DC | PRN
  Administered 2012-01-20: 600 mg via ORAL
  Filled 2012-01-19: qty 1

## 2012-01-19 NOTE — ED Notes (Signed)
ACT team member Court Trueblood can be reached at [phone]

## 2012-01-19 NOTE — ED Notes (Signed)
Pt voided on the bed after getting up to go to the bathroom, linens and scrubs changed.  Pt remains directable, but not able to cooperated fully

## 2012-01-19 NOTE — ED Notes (Signed)
Per ACT team member- they increased medications on Friday to help with manic state. Pt was in the next door neighbor's yard shouting about wanting a cigarette. ACT gave her the afternoon medications she was scheduled and both ACT and patient boyfriend helped the patient back into the house and took out papers on her as she is unable to care for herself at home.  Police were involved.  Pt has had improved sleep with Zyprexa (added by psych MD), but medication has not improved the patient's manic state.

## 2012-01-19 NOTE — ED Notes (Signed)
Laying [place], watching tv

## 2012-01-19 NOTE — ED Notes (Signed)
Police here w/ pt, pt still yelling at times, redirectable, but not able to follow commands fully

## 2012-01-19 NOTE — ED Notes (Signed)
Pt has become very agitated, is stripping her clothes off and refuses to stay in her room. States she wants her clothes and she is going home. Pt is IVC'd. Received orders from Dr. Ranae Palms to administer 20mg  Geodon IM. Informed Dr. Ranae Palms that pt had received Geodon 20mg  IM at 1700 today and asked if he wanted to continue this order. Affirmed and administered as ordered. Pt tolerated well.

## 2012-01-19 NOTE — ED Notes (Signed)
Doloris Hall, pt boyfriend can be reached at - [phone]

## 2012-01-19 NOTE — ED Notes (Signed)
Pt was here yesterday and diagnosed with a urinary issue. Pt was given an anti-inflammatory and told to follow up with urology.  Pt is IVC by ACT team for being confused, agitated and violent.  Pt boyfriend at bedside.  Pt reports feeling "fine."  Pt denies SI/HI. Pt denies pain and reports she is hungry and would like to eat.

## 2012-01-19 NOTE — ED Provider Notes (Signed)
History     CSN: [id number]  Arrival date & time 01/19/12  1453   First MD Initiated Contact with Patient 01/19/12 1620      Chief Complaint  Patient presents with  . Medical Clearance    (Consider location/radiation/quality/duration/timing/severity/associated sxs/prior treatment) HPI Patient is here for agitation and psychosis.  The patient will answer some questions  but is not answering questions and is talking out of her head.  Patient states she is not suicidal or homicidal. Past Medical History  Diagnosis Date  . Thyroid disease   . Anxiety   . Mental disorder   . Depression   . Hepatitis   . Hypothyroidism     Past Surgical History  Procedure Date  . Arm fracture   . Rotator cuff repair     No family history on file.  History  Substance Use Topics  . Smoking status: Never Smoker   . Smokeless tobacco: Never Used  . Alcohol Use: Yes    OB History    Grav Para Term Preterm Abortions TAB SAB Ect Mult Living                  Review of Systems All pertinent positives and negatives reviewed in the history of present illness  Allergies  Sulfa antibiotics  Home Medications   Current Outpatient Rx  Name Route Sig Dispense Refill  . CYANOCOBALAMIN 100 MCG PO TABS Oral Take 100 mcg by mouth daily.     Marland Kitchen DICLOFENAC SODIUM 75 MG PO TBEC Oral Take 1 tablet (75 mg total) by mouth 2 (two) times daily. 30 tablet 0  . LEVOTHYROXINE SODIUM 75 MCG PO TABS Oral Take 75 mcg by mouth daily.    Marland Kitchen PHENAZOPYRIDINE HCL 200 MG PO TABS Oral Take 1 tablet (200 mg total) by mouth 3 (three) times daily. 12 tablet 0  . TRAZODONE HCL 100 MG PO TABS Oral Take 100 mg by mouth at bedtime.    Marland Kitchen ZIPRASIDONE HCL 80 MG PO CAPS Oral Take 80 mg by mouth 2 (two) times daily with a meal.     . OLANZAPINE 20 MG PO TABS Oral Take 20 mg by mouth at bedtime.      There were no vitals taken for this visit.  Physical Exam  Constitutional: She appears well-developed and well-nourished.    HENT:  Head: Normocephalic and atraumatic.  Cardiovascular: Normal rate and regular rhythm.   Pulmonary/Chest: Effort normal and breath sounds normal.  Skin: Skin is warm and dry.  Psychiatric: Her mood appears anxious. Her affect is inappropriate. Her speech is rapid and/or pressured. She is agitated and is hyperactive. Thought content is delusional. She expresses no homicidal and no suicidal ideation. She expresses no suicidal plans and no homicidal plans.    ED Course  Procedures (including critical care time)  Labs Reviewed  COMPREHENSIVE METABOLIC PANEL - Abnormal; Notable for the following:    Sodium 134 (*) DELTA CHECK NOTED   Glucose, Bld 123 (*)    GFR calc non Af Amer 74 (*)    GFR calc Af Amer 86 (*)    All other components within normal limits  CBC  ETHANOL  URINE RAPID DRUG SCREEN (HOSP PERFORMED)   She will need further psychiatric evaluation as the patient is not able to be assessed accurately in triage. Patient will need ACT team assessment.  MDM          Carlyle Dolly, PA-C 01/19/12 1637  Carlyle Dolly, PA-C  01/19/12 2328 

## 2012-01-19 NOTE — ED Notes (Signed)
Pt took off scrubs and refused to redress.  Pt droosy, laying down, covered w/ blanket and instructed not to leave room

## 2012-01-19 NOTE — BH Assessment (Signed)
Assessment Note   Rebekah Pace is an 56 y.o. female who presents under IVC via [employer]. IVC papers simply state that patient is mentally ill but don't mention what behaviors led to pt being involuntarily committed. Pt is well known to [facility] hospital system and has presented in [facility] multiple times in past few months. Pt's affect is incongruent with situation. During assessment, pt took off her scrubs, stating, "I want my clothes" b/c she was ready be discharged. Once she was again clothed in her scrubs, she lay down on her back and put her legs in the air stating, "I'm a wild and crazy woman." Pt is now receiving community services at [facility]. [facility] is providing med management. Pt denies SI, HI, A/VH and delusions. She denies substance use.     Axis I: Schizoaffective D/O, Bipolar Type Axis II: Deferred Axis III:  Past Medical History  Diagnosis Date  . Thyroid disease   . Anxiety   . Mental disorder   . Depression   . Hepatitis   . Hypothyroidism    Axis IV: economic problems, occupational problems, other psychosocial or environmental problems, problems related to social environment and problems with primary support group Axis V: 31-40 impairment in reality testing  Past Medical History:  Past Medical History  Diagnosis Date  . Thyroid disease   . Anxiety   . Mental disorder   . Depression   . Hepatitis   . Hypothyroidism     Past Surgical History  Procedure Date  . Arm fracture   . Rotator cuff repair     Family History: No family history on file.  Social History:  reports that she has never smoked. She has never used smokeless tobacco. She reports that she drinks alcohol. She reports that she does not use illicit drugs.  Additional Social History:  Alcohol / Drug Use Pain Medications: as prescribed Prescriptions: as prescribed Over the Counter: as prescribed History of alcohol / drug use?: No history of alcohol / drug abuse Longest period of sobriety (when/how long):  n/a Allergies:  Allergies  Allergen Reactions  . Sulfa Antibiotics Rash    Home Medications:  Medications Prior to Admission  Medication Dose Route Frequency Provider Last Rate Last Dose  . acetaminophen (TYLENOL) tablet 650 mg  650 mg Oral Q4H PRN Jamesetta Orleans Lawyer, PA-C      . ibuprofen (ADVIL,MOTRIN) tablet 600 mg  600 mg Oral Q8H PRN Jamesetta Orleans Lawyer, PA-C      . LORazepam (ATIVAN) tablet 1 mg  1 mg Oral Q8H PRN Jamesetta Orleans Lawyer, PA-C   1 mg at 01/19/12 2104  . ondansetron (ZOFRAN) tablet 4 mg  4 mg Oral Q8H PRN Jamesetta Orleans Lawyer, PA-C      . ziprasidone (GEODON) injection 20 mg  20 mg Intramuscular Once [employer], PA-C   20 mg at 01/19/12 1718  . ziprasidone (GEODON) injection 20 mg  20 mg Intramuscular Once Loren Racer, MD   20 mg at 01/19/12 2122   Medications Prior to Admission  Medication Sig Dispense Refill  . cyanocobalamin 100 MCG tablet Take 100 mcg by mouth daily.       . diclofenac (VOLTAREN) 75 MG EC tablet Take 1 tablet (75 mg total) by mouth 2 (two) times daily.  30 tablet  0  . levothyroxine (SYNTHROID, LEVOTHROID) 75 MCG tablet Take 75 mcg by mouth daily.      . phenazopyridine (PYRIDIUM) 200 MG tablet Take 1 tablet (200 mg total) by  mouth 3 (three) times daily.  12 tablet  0  . traZODone (DESYREL) 100 MG tablet Take 100 mg by mouth at bedtime.      . ziprasidone (GEODON) 80 MG capsule Take 80 mg by mouth 2 (two) times daily with a meal.       . OLANZapine (ZYPREXA) 20 MG tablet Take 20 mg by mouth at bedtime.        OB/GYN Status:  No LMP recorded. Patient is postmenopausal.  General Assessment Data Location of Assessment: [facility] Living Arrangements: Alone Can pt return to current living arrangement?: Yes Admission Status: Involuntary Is patient capable of signing voluntary admission?: No Transfer from: Acute Hospital Referral Source: Other (GPD)  Education Status Is patient currently in school?: No Highest grade of school  patient has completed: college grad Name of school: [employer] Contact person: na  Risk to self Suicidal Ideation: No Suicidal Intent: No Is patient at risk for suicide?: No Suicidal Plan?: No Access to Means: No What has been your use of drugs/alcohol within the last 12 months?: none Previous Attempts/Gestures: No Other Self Harm Risks: none Triggers for Past Attempts:  (n/a) Intentional Self Injurious Behavior: None Family Suicide History: No Recent stressful life event(s): Other (Comment) ("nothing") Persecutory voices/beliefs?: No Depression: No Substance abuse history and/or treatment for substance abuse?: No Suicide prevention information given to non-admitted patients: Not applicable  Risk to Others Homicidal Ideation: No Thoughts of Harm to Others: No Current Homicidal Intent: No Current Homicidal Plan: No Access to Homicidal Means: No Identified Victim: n/a History of harm to others?: No Assessment of Violence: None Noted Violent Behavior Description: n/a Does patient have access to weapons?: No Criminal Charges Pending?: No Describe Pending Criminal Charges: no Does patient have a court date: No  Psychosis Hallucinations: None noted Delusions: None noted  Mental Status Report Appear/Hygiene: Disheveled Eye Contact: Fair Motor Activity: Freedom of movement;Restlessness Speech: Logical/coherent;Incoherent Level of Consciousness: Alert Mood: Other (Comment) (states mood is "crazy") Affect: Inconsistent with thought content;Labile Anxiety Level: None Thought Processes: Flight of Ideas;Relevant Judgement: Impaired Orientation: Person;Place;Time;Situation Obsessive Compulsive Thoughts/Behaviors: None  Cognitive Functioning Concentration: Normal Memory: Recent Intact;Remote Intact IQ: Average Insight: Poor Impulse Control: Poor Appetite: Good Weight Loss: 0  Weight Gain: 0  Sleep: No Change Total Hours of Sleep: 5  Vegetative Symptoms:  None  Prior Inpatient Therapy Prior Inpatient Therapy: Yes Prior Therapy Dates: 2012 Prior Therapy Facilty/Provider(s): multiple admits to [facility] in 2012 & 2013 Reason for Treatment: Psychosis   Prior Outpatient Therapy Prior Outpatient Therapy: Yes Prior Therapy Dates: Current  Prior Therapy Facilty/Provider(s): [facility] Reason for Treatment: med management  ADL Screening (condition at time of admission) Patient's cognitive ability adequate to safely complete daily activities?: Yes Patient able to express need for assistance with ADLs?: Yes Independently performs ADLs?: Yes Weakness of Legs: None Weakness of Arms/Hands: None  Home Assistive Devices/Equipment Home Assistive Devices/Equipment: None    Abuse/Neglect Assessment (Assessment to be complete while patient is alone) Physical Abuse: Denies Verbal Abuse: Denies Sexual Abuse: Denies Exploitation of patient/patient's resources: Denies Self-Neglect: Denies Values / Beliefs Cultural Requests During Hospitalization: None Spiritual Requests During Hospitalization: None   Advance Directives (For Healthcare) Advance Directive: Patient does not have advance directive    Additional Information 1:1 In Past 12 Months?: No CIRT Risk: No Elopement Risk: No Does patient have medical clearance?: Yes     Disposition:  Disposition Disposition of Patient: Other dispositions (pending telepsych) Type of inpatient treatment program: Adult Other disposition(s): To  current provider;Other (Comment) Patient referred to: Other (Comment) ([facility])  On Site Evaluation by:   Reviewed with Physician:     Donnamarie Rossetti P 01/19/2012 9:32 PM

## 2012-01-19 NOTE — ED Notes (Signed)
Pt dressed and up to the bathroom

## 2012-01-19 NOTE — ED Notes (Signed)
Pt presents with [employer] with IVC papers

## 2012-01-20 MED ORDER — LORAZEPAM 1 MG PO TABS
2.0000 mg | ORAL_TABLET | Freq: Once | ORAL | Status: AC
  Administered 2012-01-20: 2 mg via ORAL
  Filled 2012-01-20: qty 2

## 2012-01-20 NOTE — Discharge Planning (Signed)
Requested telepsych on patient.  Manson Passey Devery Odwyer ANN S , MSW, LCSWA 01/20/2012 8:32 AM [phone]

## 2012-01-20 NOTE — ED Provider Notes (Signed)
The psychiatrist/telepsych md has evaluated pt - they rescind ivc and indicate pt stable for discharge home. Pt alert and awake. Cooperative. Ambulatory about ed. Has normal mood and affect. Denies any thoughts of harm to self or others. No hallucinations, pt not delusional or acutely psychotic.  Pt states is willing to follow up as outpt. Will provide resource guide, encouraged to follow up with [facility], [facility].   Suzi Roots, MD 01/20/12 1003

## 2012-01-20 NOTE — Discharge Instructions (Signed)
Follow up with primary care doctor and mental health/[facility] in the next few days.  Use resource guide provided. Return to ER if worse, new symptoms, other concern.      RESOURCE GUIDE  Dental Problems  Patients with Medicaid: [facility] [phone] [address]                                           [phone] [address] Phone:  [phone]                                                  Phone:  [phone]  If unable to pay or uninsured, contact:  [facility] or [facility]. to become qualified for the adult dental clinic.  Chronic Pain Problems Contact [facility]  [phone] Patients need to be referred by their primary care doctor.  Insufficient Money for Medicine Contact United Way:  call "211" or [facility] [phone].  No Primary Care Doctor Call [facility]  [phone] Other agencies that provide inexpensive medical care    [facility]  [phone]    [facility] Internal Medicine  [phone]    [facility]  [phone]    [facility]  [phone]    [facility]  [phone]    [facility] Child Clinic  [phone]  Psychological Services [facility] Behavioral Health  [phone] [facility] Services  [phone] [facility] Mental Health   [phone] (emergency services [phone])  Substance Abuse Resources Alcohol and Drug Services  [phone] [facility] [phone] [facility] [place] [phone] Floydene Flock [phone] Residential & Outpatient Substance Abuse Program  [phone]  Abuse/Neglect [facility] Child Abuse Hotline [phone] [facility] Child Abuse Hotline [phone] (After Hours)  Emergency Shelter [facility] [phone]  Maternity Homes Room at the [place] [facility] [phone] [facility] [phone]  MRSA Hotline #:   531-856-1801    [facility]  Resources  [facility]     United Way                          [facility] Dept. [address] [place]                       [address]      [address]  [place]                                                Cristobal Goldmann Phone:  [phone]  Phone:  [phone]                 Phone:  [phone]  [facility] Mental Health Phone:  [phone]  [facility] Child Abuse Hotline [phone] [phone])313-7165 (After Hours)      Anxiety and Panic Attacks Your caregiver has informed you that you are having an anxiety or panic attack. There may be many forms of this. Most of the time these attacks come suddenly and without warning. They come at any time of day, including periods of sleep, and at any time of life. They may be strong and unexplained. Although panic attacks are very scary, they are physically harmless. Sometimes the cause of your anxiety is not known. Anxiety is a protective mechanism of the body in its fight or flight mechanism. Most of these perceived danger situations are actually nonphysical situations (such as anxiety over losing a job). CAUSES  The causes of an anxiety or panic attack are many. Panic attacks may occur in otherwise healthy people given a certain set of circumstances. There may be a genetic cause for panic attacks. Some medications may also have anxiety as a side effect. SYMPTOMS  Some of the most common feelings are:  Intense terror.   Dizziness, feeling faint.   Hot and cold flashes.   Fear of going crazy.   Feelings that nothing is real.   Sweating.   Shaking.   Chest pain or a fast heartbeat (palpitations).   Smothering, choking sensations.   Feelings of impending doom and that death is near.   Tingling of extremities, this may be from over-breathing.   Altered reality (derealization).   Being detached from yourself  (depersonalization).  Several symptoms can be present to make up anxiety or panic attacks. DIAGNOSIS  The evaluation by your caregiver will depend on the type of symptoms you are experiencing. The diagnosis of anxiety or panic attack is made when no physical illness can be determined to be a cause of the symptoms. TREATMENT  Treatment to prevent anxiety and panic attacks may include:  Avoidance of circumstances that cause anxiety.   Reassurance and relaxation.   Regular exercise.   Relaxation therapies, such as yoga.   Psychotherapy with a psychiatrist or therapist.   Avoidance of caffeine, alcohol and illegal drugs.   Prescribed medication.  SEEK IMMEDIATE MEDICAL CARE IF:   You experience panic attack symptoms that are different than your usual symptoms.   You have any worsening or concerning symptoms.  Document Released: 09/30/2005 Document Revised: 09/19/2011 Document Reviewed: 02/01/2010 [facility] Patient Information 2012 [place], [place].

## 2012-01-20 NOTE — ED Notes (Signed)
Pt requiring constant redirection back to room.

## 2012-01-20 NOTE — ED Notes (Signed)
Pt is very tearful and anxious. Medicated with 2mg  Ativan as ordered by Dr. Hyacinth Meeker. Provided sandwich and cranberry juice, offered support and encouragement.

## 2012-01-20 NOTE — ED Notes (Signed)
Pt showering at this time

## 2012-01-21 ENCOUNTER — Emergency Department (HOSPITAL_COMMUNITY)
Admission: EM | Admit: 2012-01-21 | Discharge: 2012-01-22 | Payer: Medicaid Other | Attending: Emergency Medicine | Admitting: Emergency Medicine

## 2012-01-21 DIAGNOSIS — E079 Disorder of thyroid, unspecified: Secondary | ICD-10-CM | POA: Insufficient documentation

## 2012-01-21 DIAGNOSIS — IMO0002 Reserved for concepts with insufficient information to code with codable children: Secondary | ICD-10-CM

## 2012-01-21 DIAGNOSIS — Z79899 Other long term (current) drug therapy: Secondary | ICD-10-CM | POA: Insufficient documentation

## 2012-01-21 DIAGNOSIS — F919 Conduct disorder, unspecified: Secondary | ICD-10-CM | POA: Insufficient documentation

## 2012-01-21 DIAGNOSIS — F341 Dysthymic disorder: Secondary | ICD-10-CM | POA: Insufficient documentation

## 2012-01-21 LAB — DIFFERENTIAL
Basophils Absolute: 0 10*3/uL (ref 0.0–0.1)
Lymphocytes Relative: 17 % (ref 12–46)
Monocytes Absolute: 1 10*3/uL (ref 0.1–1.0)
Neutro Abs: 5.3 10*3/uL (ref 1.7–7.7)
Neutrophils Relative %: 65 % (ref 43–77)

## 2012-01-21 LAB — CBC
HCT: 37.2 % (ref 36.0–46.0)
Platelets: 235 10*3/uL (ref 150–400)
RDW: 13.6 % (ref 11.5–15.5)
WBC: 8.2 10*3/uL (ref 4.0–10.5)

## 2012-01-21 LAB — RAPID URINE DRUG SCREEN, HOSP PERFORMED
Barbiturates: NOT DETECTED
Benzodiazepines: NOT DETECTED

## 2012-01-21 NOTE — ED Notes (Signed)
Bed:WHALA<BR> Expected date:01/21/12<BR> Expected time:10:15 PM<BR> Means of arrival:Ambulance<BR> Comments:<BR> BH;IC.  Pt 36.  56 yo F. IC [employer] following unit. Vitals stable but tachy at 115. 20 mins
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
## 2012-01-22 LAB — COMPREHENSIVE METABOLIC PANEL
ALT: 25 U/L (ref 0–35)
AST: 23 U/L (ref 0–37)
Albumin: 3.6 g/dL (ref 3.5–5.2)
Alkaline Phosphatase: 92 U/L (ref 39–117)
BUN: 10 mg/dL (ref 6–23)
CO2: 27 mEq/L (ref 19–32)
Calcium: 9.3 mg/dL (ref 8.4–10.5)
Chloride: 103 mEq/L (ref 96–112)
Creatinine, Ser: 0.81 mg/dL (ref 0.50–1.10)
GFR calc Af Amer: >90 mL/min (ref >90)
GFR calc non Af Amer: 80 mL/min — ABNORMAL LOW (ref >90)
Glucose, Bld: 112 mg/dL — ABNORMAL HIGH (ref 70–99)
Potassium: 4 mEq/L (ref 3.5–5.1)
Sodium: 137 mEq/L (ref 135–145)
Total Bilirubin: 0.2 mg/dL — ABNORMAL LOW (ref 0.3–1.2)
Total Protein: 7 g/dL (ref 6.0–8.3)

## 2012-01-22 LAB — ACETAMINOPHEN LEVEL: Acetaminophen (Tylenol), Serum: <15 ug/mL (ref 10–30)

## 2012-01-22 LAB — ETHANOL: Alcohol, Ethyl (B): <11 mg/dL (ref 0–11)

## 2012-01-22 LAB — SALICYLATE LEVEL: Salicylate Lvl: <2 mg/dL — ABNORMAL LOW (ref 2.8–20.0)

## 2012-01-22 MED ORDER — PHENAZOPYRIDINE HCL 200 MG PO TABS: 200.0000 mg | ORAL_TABLET | Freq: Three times a day (TID) | ORAL | Status: DC

## 2012-01-22 MED ORDER — IBUPROFEN 200 MG PO TABS: 600.0000 mg | ORAL_TABLET | Freq: Three times a day (TID) | ORAL | Status: DC | PRN

## 2012-01-22 MED ORDER — ONDANSETRON HCL 4 MG PO TABS: 4.0000 mg | ORAL_TABLET | Freq: Three times a day (TID) | ORAL | Status: DC | PRN

## 2012-01-22 MED ORDER — ZOLPIDEM TARTRATE 5 MG PO TABS: 5.0000 mg | ORAL_TABLET | Freq: Every evening | ORAL | Status: DC | PRN

## 2012-01-22 MED ORDER — LORAZEPAM 1 MG PO TABS: 1.0000 mg | ORAL_TABLET | Freq: Three times a day (TID) | ORAL | Status: DC | PRN

## 2012-01-22 MED ORDER — DICLOFENAC SODIUM 75 MG PO TBEC
75.0000 mg | DELAYED_RELEASE_TABLET | Freq: Two times a day (BID) | ORAL | Status: DC
  Filled 2012-01-22 (×2): qty 1

## 2012-01-22 MED ORDER — VITAMIN B-12 100 MCG PO TABS
100.0000 ug | ORAL_TABLET | Freq: Every day | ORAL | Status: DC
  Filled 2012-01-22: qty 1

## 2012-01-22 MED ORDER — TRAZODONE HCL 100 MG PO TABS: 100.0000 mg | ORAL_TABLET | Freq: Every day | ORAL | Status: DC

## 2012-01-22 MED ORDER — LEVOTHYROXINE SODIUM 75 MCG PO TABS
75.0000 ug | ORAL_TABLET | Freq: Every day | ORAL | Status: DC
  Filled 2012-01-22: qty 1

## 2012-01-22 MED ORDER — ZIPRASIDONE HCL 20 MG PO CAPS: 80.0000 mg | ORAL_CAPSULE | Freq: Two times a day (BID) | ORAL | Status: DC
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
## 2012-01-22 NOTE — Discharge Instructions (Signed)
Please followup with your primary care provider and psychiatric specialist for continued evaluation and treatment of your symptoms.   Conduct Disorder Conduct disorder is a chronic, repeated pattern of behavior that violates basic rights of others or societal rules.  CAUSES A clear cause for conduct disorder has not been determined. However, there are both genetic and environmental risk factors for the development of conduct disorder, such as having a parent with antisocial personality disorder or alcohol dependence.  SYMPTOMS Symptoms of conduct disorder most often begin between middle childhood and middle adolescence and occur in multiple settings. Symptoms include:   Aggression toward people or animals.   Bullying, threatening, or intimidating behavior.   Starting physical fights or aggressive reactions to others.   Use of a weapon that can cause serious physical harm to others.   Destruction of property.   Unlawful entry into another's car or home.   Lying.   Stealing.   Running away from home for lengthy periods.   Skipping school.  DIAGNOSIS Conduct disorder is diagnosed by the following:  Exam of the child alone and with a parent or caregiver.   Interview with the parents or caregiver alone.   Review of school reports.   A physical exam.  Document Released: 01/15/2011 Document Revised: 09/19/2011 Document Reviewed: 01/15/2011 [facility] Patient Information 2012 [place], [place].   RESOURCE GUIDE  Dental Problems  Patients with Medicaid: [facility] [phone] [address]                                           [phone] [address] Phone:  [phone]                                                  Phone:  [phone]  If unable to pay or uninsured, contact:  [facility] or [facility]. to become qualified for the adult dental clinic.  Chronic Pain Problems Contact [facility]   [phone] Patients need to be referred by their primary care doctor.  Insufficient Money for Medicine Contact United Way:  call "211" or [facility] [phone].  No Primary Care Doctor Call [facility]  [phone] Other agencies that provide inexpensive medical care    [facility]  [phone]    [facility] Internal Medicine  [phone]    [facility]  [phone]    [facility]  [phone]    [facility]  [phone]    [facility] Child Clinic  [phone]  Psychological Services [facility] Behavioral Health  [phone] [facility] Services  [phone] [facility] Mental Health   [phone] (emergency services [phone])  Substance Abuse Resources Alcohol and Drug Services  [phone] [facility] [phone] [facility] [place] [phone] Floydene Flock [phone] Residential & Outpatient Substance Abuse Program  [phone]  Abuse/Neglect [facility] Child Abuse Hotline 604-578-8802 [facility] Child Abuse Hotline 812-513-3247 (After Hours)  Emergency Shelter [facility] [phone])[phone]  Maternity Homes Room at the [place] [facility] [phone] [facility] Services [phone]  MRSA Hotline #:   (662)183-5536  [facility] Dept. [address]      [place] Phone:  [phone]                                   Phone:  [phone]                 Phone:  [place] Phone:  [place] [phone] (After Hours)

## 2012-01-22 NOTE — ED Notes (Signed)
Pt has ambulated in and out of room constantly since arriving on unit. She states she has the flu, but her VSS are normal, afebrile. Pt requires frequent redirection unless she wants something, and then is able to follow directions perfectly in order to get what she wants. Pt continuously states that she is dehydrated, but has had of PO fluids since arriving on this unit.

## 2012-01-22 NOTE — ED Provider Notes (Signed)
Medical screening examination/treatment/procedure(s) were performed by non-physician practitioner and as supervising physician I was immediately available for consultation/collaboration.   Osie Amparo D Brenan [place], MD 01/22/12 0720 

## 2012-01-22 NOTE — ED Notes (Signed)
Pt asleep at this time. [employer] at bedside. No issues at this time. Pt in front nursing station. Easily visualized. Will continue to monitor

## 2012-01-22 NOTE — ED Notes (Signed)
Pt belongings scanned by security and placed in locker 42.

## 2012-01-22 NOTE — ED Notes (Signed)
Pt trying to get out of bed. Stated she had to use the bathroom. Talia, NT assisted pt to bathroom. [employer] still monitoring patient. Pt placed back in bed at this time
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
## 2012-01-27 NOTE — ED Provider Notes (Signed)
Medical screening examination/treatment/procedure(s) were performed by non-physician practitioner and as supervising physician I was immediately available for consultation/collaboration.   Loren Racer, MD 01/27/12 1101

## 2012-02-04 ENCOUNTER — Emergency Department (HOSPITAL_COMMUNITY)
Admission: EM | Admit: 2012-02-04 | Discharge: 2012-02-05 | Disposition: A | Discharge status: Home / Self Care | Payer: Medicaid Other | Attending: Emergency Medicine | Admitting: Emergency Medicine

## 2012-02-04 ENCOUNTER — Emergency Department (HOSPITAL_COMMUNITY): Payer: Medicaid Other

## 2012-02-04 ENCOUNTER — Encounter (HOSPITAL_COMMUNITY): Payer: Self-pay | Admitting: Emergency Medicine

## 2012-02-04 DIAGNOSIS — E860 Dehydration: Secondary | ICD-10-CM

## 2012-02-04 DIAGNOSIS — M549 Dorsalgia, unspecified: Secondary | ICD-10-CM | POA: Insufficient documentation

## 2012-02-04 DIAGNOSIS — R000 Tachycardia, unspecified: Secondary | ICD-10-CM | POA: Insufficient documentation

## 2012-02-04 DIAGNOSIS — Z9119 Patient's noncompliance with other medical treatment and regimen: Secondary | ICD-10-CM | POA: Insufficient documentation

## 2012-02-04 DIAGNOSIS — Z9114 Patient's other noncompliance with medication regimen: Secondary | ICD-10-CM

## 2012-02-04 DIAGNOSIS — R51 Headache: Secondary | ICD-10-CM | POA: Insufficient documentation

## 2012-02-04 DIAGNOSIS — M25569 Pain in unspecified knee: Secondary | ICD-10-CM | POA: Insufficient documentation

## 2012-02-04 DIAGNOSIS — IMO0002 Reserved for concepts with insufficient information to code with codable children: Secondary | ICD-10-CM | POA: Insufficient documentation

## 2012-02-04 DIAGNOSIS — E079 Disorder of thyroid, unspecified: Secondary | ICD-10-CM | POA: Insufficient documentation

## 2012-02-04 DIAGNOSIS — Y9210 Unspecified residential institution as the place of occurrence of the external cause: Secondary | ICD-10-CM | POA: Insufficient documentation

## 2012-02-04 DIAGNOSIS — F259 Schizoaffective disorder, unspecified: Secondary | ICD-10-CM | POA: Insufficient documentation

## 2012-02-04 DIAGNOSIS — Z91199 Patient's noncompliance with other medical treatment and regimen due to unspecified reason: Secondary | ICD-10-CM | POA: Insufficient documentation

## 2012-02-04 LAB — SALICYLATE LEVEL: Salicylate Lvl: <2 mg/dL — ABNORMAL LOW (ref 2.8–20.0)

## 2012-02-04 LAB — CBC
HCT: 38.3 % (ref 36.0–46.0)
Hemoglobin: 13.4 g/dL (ref 12.0–15.0)
MCH: 29.6 pg (ref 26.0–34.0)
MCHC: 35 g/dL (ref 30.0–36.0)
MCV: 84.5 fL (ref 78.0–100.0)

## 2012-02-04 LAB — COMPREHENSIVE METABOLIC PANEL
BUN: 11 mg/dL (ref 6–23)
CO2: 27 mEq/L (ref 19–32)
Calcium: 9.9 mg/dL (ref 8.4–10.5)
GFR calc Af Amer: 79 mL/min — ABNORMAL LOW (ref >90)
GFR calc non Af Amer: 68 mL/min — ABNORMAL LOW (ref >90)
Glucose, Bld: 151 mg/dL — ABNORMAL HIGH (ref 70–99)
Total Protein: 7.9 g/dL (ref 6.0–8.3)

## 2012-02-04 LAB — ACETAMINOPHEN LEVEL: Acetaminophen (Tylenol), Serum: <15 ug/mL (ref 10–30)

## 2012-02-04 LAB — ETHANOL: Alcohol, Ethyl (B): <11 mg/dL (ref 0–11)

## 2012-02-04 MED ORDER — LORAZEPAM 1 MG PO TABS
1.0000 mg | ORAL_TABLET | Freq: Once | ORAL | Status: AC
  Administered 2012-02-04: 1 mg via ORAL
  Filled 2012-02-04: qty 1

## 2012-02-04 MED ORDER — LEVOTHYROXINE SODIUM 75 MCG PO TABS
75.0000 ug | ORAL_TABLET | Freq: Every day | ORAL | Status: DC
  Filled 2012-02-04: qty 1

## 2012-02-04 MED ORDER — TRAZODONE HCL 100 MG PO TABS
100.0000 mg | ORAL_TABLET | Freq: Every day | ORAL | Status: DC
  Filled 2012-02-04: qty 1

## 2012-02-04 MED ORDER — IBUPROFEN 200 MG PO TABS: 600.0000 mg | ORAL_TABLET | Freq: Three times a day (TID) | ORAL | Status: DC | PRN

## 2012-02-04 MED ORDER — ZIPRASIDONE HCL 80 MG PO CAPS
80.0000 mg | ORAL_CAPSULE | Freq: Two times a day (BID) | ORAL | Status: DC
  Filled 2012-02-04 (×2): qty 1

## 2012-02-04 MED ORDER — ONDANSETRON HCL 8 MG PO TABS: 4.0000 mg | ORAL_TABLET | Freq: Three times a day (TID) | ORAL | Status: DC | PRN

## 2012-02-04 MED ORDER — NICOTINE 21 MG/24HR TD PT24: 21.0000 mg | MEDICATED_PATCH | Freq: Every day | TRANSDERMAL | Status: DC
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
## 2012-02-04 NOTE — ED Notes (Signed)
Security not available at this time; to want patient in exam room.  Charge RN aware.

## 2012-02-04 NOTE — ED Notes (Signed)
Pt has been ask many time to void and she still states she cannot go.

## 2012-02-04 NOTE — ED Notes (Signed)
Pt unable to provide urine specimen at this time - water given
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
## 2012-02-04 NOTE — ED Notes (Signed)
Pt attempting to leave room and walk around.  Sitter placed at bedside to help redirect patient.

## 2012-02-04 NOTE — ED Notes (Signed)
Pt's significant other reports released from jail today (approx 1/2 hr pta) after being allegedly charged with trespassing; was incarcerated x2 weeks; however, charges were dropped; when significant other picked pt up at jail this afternoon, he was instructed to transport pt to ED for psych eval; pt currently denies SI nor HI; speaks in loud tone with pressured speech; has not had any of her routine meds x2 weeks as she was incarcerated; ACT team took pt's meds to jail; however, staff attempted to administer meds in liquid form - pt refused all meds for this reason; per pt, she routinely takes Geodan, Synthroid, Zyprexa, B12, and Trazadone; pt presently agitated, easily redirected and calmed, significant other remains at bedside as this seems to calm pt

## 2012-02-04 NOTE — ED Notes (Signed)
Patient being changed into blue paper scrubs; charge RN notified that patient may need sitter (due to outbursts).

## 2012-02-04 NOTE — ED Notes (Signed)
Sending security to wand patient.

## 2012-02-05 LAB — URINALYSIS, ROUTINE W REFLEX MICROSCOPIC
Glucose, UA: NEGATIVE mg/dL
Ketones, ur: 15 mg/dL — AB
Nitrite: NEGATIVE
Specific Gravity, Urine: 1.021 (ref 1.005–1.030)
pH: 5.5 (ref 5.0–8.0)

## 2012-02-05 LAB — URINE MICROSCOPIC-ADD ON

## 2012-02-05 LAB — T4, FREE: Free T4: 1.47 ng/dL (ref 0.80–1.80)

## 2012-02-05 LAB — RAPID URINE DRUG SCREEN, HOSP PERFORMED
Benzodiazepines: POSITIVE — AB
Cocaine: NOT DETECTED

## 2012-02-05 LAB — LIPASE, BLOOD: Lipase: 15 U/L (ref 11–59)

## 2012-02-05 MED ORDER — SODIUM CHLORIDE 0.9 % IV BOLUS (SEPSIS)
1000.0000 mL | Freq: Once | INTRAVENOUS | Status: AC
  Administered 2012-02-05: 1000 mL via INTRAVENOUS

## 2012-02-05 NOTE — ED Notes (Signed)
Pt speaking to psych MD via tele psych.

## 2012-02-05 NOTE — ED Notes (Signed)
If need to contact family.Mother Tamecka Milham [phone] [place] [phone][home],[phone][cell],Close Clearance C[place] same number as son.

## 2012-02-05 NOTE — Discharge Instructions (Signed)
Continue to take your medications including Geodon and Trazodone. Follow up with [facility]. Drink plenty of fluids.  Dehydration, Adult Dehydration is when you lose more fluids from the body than you take in. Vital organs like the kidneys, brain, and heart cannot function without a proper amount of fluids and salt. Any loss of fluids from the body can cause dehydration.  CAUSES   Vomiting.   Diarrhea.   Excessive sweating.   Excessive urine output.   Fever.  SYMPTOMS  Mild dehydration  Thirst.   Dry lips.   Slightly dry mouth.  Moderate dehydration  Very dry mouth.   Sunken eyes.   Skin does not bounce back quickly when lightly pinched and released.   Dark urine and decreased urine production.   Decreased tear production.   Headache.  Severe dehydration  Very dry mouth.   Extreme thirst.   Rapid, weak pulse (more than 100 beats per minute at rest).   Cold hands and feet.   Not able to sweat in spite of heat and temperature.   Rapid breathing.   Blue lips.   Confusion and lethargy.   Difficulty being awakened.   Minimal urine production.   No tears.  DIAGNOSIS  Your caregiver will diagnose dehydration based on your symptoms and your exam. Blood and urine tests will help confirm the diagnosis. The diagnostic evaluation should also identify the cause of dehydration. TREATMENT  Treatment of mild or moderate dehydration can often be done at home by increasing the amount of fluids that you drink. It is best to drink small amounts of fluid more often. Drinking too much at one time can make vomiting worse. Refer to the home care instructions below. Severe dehydration needs to be treated at the hospital where you will probably be given intravenous (IV) fluids that contain water and electrolytes. HOME CARE INSTRUCTIONS   Ask your caregiver about specific rehydration instructions.   Drink enough fluids to keep your urine clear or pale yellow.   Drink small  amounts frequently if you have nausea and vomiting.   Eat as you normally do.   Avoid:   Foods or drinks high in sugar.   Carbonated drinks.   Juice.   Extremely hot or cold fluids.   Drinks with caffeine.   Fatty, greasy foods.   Alcohol.   Tobacco.   Overeating.   Gelatin desserts.   Wash your hands well to avoid spreading bacteria and viruses.   Only take over-the-counter or prescription medicines for pain, discomfort, or fever as directed by your caregiver.   Ask your caregiver if you should continue all prescribed and over-the-counter medicines.   Keep all follow-up appointments with your caregiver.  SEEK MEDICAL CARE IF:  You have abdominal pain and it increases or stays in one area (localizes).   You have a rash, stiff neck, or severe headache.   You are irritable, sleepy, or difficult to awaken.   You are weak, dizzy, or extremely thirsty.  SEEK IMMEDIATE MEDICAL CARE IF:   You are unable to keep fluids down or you get worse despite treatment.   You have frequent episodes of vomiting or diarrhea.   You have blood or green matter (bile) in your vomit.   You have blood in your stool or your stool looks black and tarry.   You have not urinated in 6 to 8 hours, or you have only urinated a small amount of very dark urine.   You have a fever.   You  faint.  MAKE SURE YOU:   Understand these instructions.   Will watch your condition.   Will get help right away if you are not doing well or get worse.  Document Released: 09/30/2005 Document Revised: 09/19/2011 Document Reviewed: 05/20/2011 [facility] Patient Information 2012 [place], [place].  RESOURCE GUIDE  Dental Problems  Patients with Medicaid: [facility] [phone] [address]                                           [phone] [address] Phone:  [phone]                                                   Phone:  [phone]  If unable to pay or  uninsured, contact:  [facility] or [facility]. to become qualified for the adult dental clinic.  Chronic Pain Problems Contact [facility]  [phone] Patients need to be referred by their primary care doctor.  Insufficient Money for Medicine Contact [employer]:  call "211" or [facility] [phone].  No Primary Care Doctor Call [facility]  [phone] Other agencies that provide inexpensive medical care    [facility]  [phone]    [facility]  [phone]    [facility]  [phone]    [facility]  [phone]    [facility]  [phone]    [facility]  [phone]  Psychological Services [facility]  [phone] [facility]  [phone] [facility]   [phone] (emergency services [phone])  Abuse/Neglect [facility] Child Abuse Hotline 843-177-0502 [facility] Child Abuse Hotline (947)296-0332 (After Hours)  Emergency Shelter [facility] [phone]  Maternity Homes [facility] [place] [facility] [phone] [facility] [phone]  MRSA Hotline #:   9723899644    [facility] Resources  [facility]  [employer]                           [facility] Dept. [address] [place]                     [address]         [address]  [place]                                               Cristobal Goldmann Phone:  [phone]                                  Phone:  [phone]  Phone:  [phone]  [facility] Phone:  [phone]  [facility] Child Abuse Hotline (787)585-4541 [phone] (After Hours)

## 2012-02-05 NOTE — BH Assessment (Addendum)
Assessment Note   Rebekah Pace is an 56 y.o. female.  Rebekah Pace was brought by [employer] to [facility].  Rebekah Pace was in jail for the last two weeks and was recommended to come to hospital upon release because she has not taken her medicine in the last two weeks and has not eaten or drank anything.  Rebekah Pace had been put in jail because of trespassing on neighbor's lawn.  She was upset about the circumstances of her incarceration.  She had gotten her medications from her ACT team ([facility]) while in the jail and had refused to take them.  She said that she never got them.  Rebekah Pace has been refusing to eat and is not sleeping well.  Her boyfriend accompanied her and said that she was not eating well or sleeping for two weeks prior to the jail experience.  She denies current HI, SI or A/V hallucinations.  She is anxious and is loud with her speech.  [name] can be redirected and is oriented.  She does make a lot of somatic complaints regarding her back and her stomach.  [name] said that she needed to have her stomach looked at because she has had a lot of pain.  She also says that she has lost a lot of weight in the last few weeks.  Dr. Lajean Saver has requested a telepsych be performed.  Dr. Jacky Kindle with [facility] telepsychiatry, recommended that Rebekah Pace be discharged home.   Axis I: Depressive Disorder NOS and Schizoaffective Disorder Axis II: Deferred Axis III:  Past Medical History  Diagnosis Date  . Thyroid disease   . Anxiety   . Mental disorder   . Depression   . Hepatitis   . Hypothyroidism    Axis IV: occupational problems and problems related to social environment Axis V: 41-50 serious symptoms  Past Medical History:  Past Medical History  Diagnosis Date  . Thyroid disease   . Anxiety   . Mental disorder   . Depression   . Hepatitis   . Hypothyroidism     Past Surgical History  Procedure Date  . Arm fracture   . Rotator cuff repair     Family History: History reviewed. No pertinent family  history.  Social History:  reports that she has never smoked. She has never used smokeless tobacco. She reports that she drinks alcohol. She reports that she does not use illicit drugs.  Additional Social History:  Alcohol / Drug Use Pain Medications: None listed Prescriptions: Geodon, Synthroid, Neurontin, Trazadone, Zyprexa, B-12 meds but has not taken any in 2 weeks Over the Counter: B-12 History of alcohol / drug use?: No history of alcohol / drug abuse Longest period of sobriety (when/how long): N/A Allergies:  Allergies  Allergen Reactions  . Sulfa Antibiotics Rash    Home Medications:  (Not in a hospital admission)  OB/GYN Status:  No LMP recorded. Patient is postmenopausal.  General Assessment Data Location of Assessment: [facility] ED Living Arrangements: Alone Can pt return to current living arrangement?: Yes Admission Status: Voluntary Is patient capable of signing voluntary admission?: Yes Transfer from: Acute Hospital Referral Source: Other ([employer])     Risk to self Suicidal Ideation: No Suicidal Intent: No Is patient at risk for suicide?: No Suicidal Plan?: No Access to Means: No What has been your use of drugs/alcohol within the last 12 months?: None Previous Attempts/Gestures: No How many times?: 0  Other Self Harm Risks: None Triggers for Past Attempts: None known Intentional Self Injurious Behavior: None  Family Suicide History: No Recent stressful life event(s): Trauma (Comment) (Was in jail for two weeks and released today) Persecutory voices/beliefs?: Yes Depression: No Depression Symptoms:  (None reported) Substance abuse history and/or treatment for substance abuse?: Yes Suicide prevention information given to non-admitted patients: Not applicable  Risk to Others Homicidal Ideation: No Thoughts of Harm to Others: No Current Homicidal Intent: No Current Homicidal Plan: No Access to Homicidal Means: No Identified Victim: No one History of harm to  others?: No Assessment of Violence: None Noted Violent Behavior Description: N/A Does patient have access to weapons?: No Criminal Charges Pending?: No Describe Pending Criminal Charges: None Does patient have a court date: No  Psychosis Hallucinations: None noted Delusions: None noted  Mental Status Report Appear/Hygiene: Disheveled Eye Contact: Fair Motor Activity: Agitation;Tremors Speech: Pressured;Loud Level of Consciousness: Alert Mood: Anxious;Irritable Affect: Irritable;Angry Anxiety Level: Panic Attacks Panic attack frequency: Daily Most recent panic attack: Today Thought Processes: Circumstantial;Tangential Judgement: Impaired Orientation: Person;Place;Time;Situation Obsessive Compulsive Thoughts/Behaviors: None  Cognitive Functioning Concentration: Decreased Memory: Recent Impaired;Remote Intact IQ: Average Insight: Poor Impulse Control: Poor Appetite: Poor Weight Loss:  (Unsure of how much.) Weight Gain: 0  Sleep: Decreased Total Hours of Sleep:  (<6H/D) Vegetative Symptoms: None  Prior Inpatient Therapy Prior Inpatient Therapy: Yes Prior Therapy Dates: 2012 Prior Therapy Facilty/Provider(s): multiple admits to [facility] in 2012 & 2013 Reason for Treatment: Refused meds for last two weeks while incarcerated  Prior Outpatient Therapy Prior Outpatient Therapy: Yes Prior Therapy Dates: Current  Prior Therapy Facilty/Provider(s):  ([facility]) Reason for Treatment: med management  ADL Screening (condition at time of admission) Patient's cognitive ability adequate to safely complete daily activities?: Yes Patient able to express need for assistance with ADLs?: Yes Independently performs ADLs?: Yes Weakness of Legs: None Weakness of Arms/Hands: None  Home Assistive Devices/Equipment Home Assistive Devices/Equipment: None    Abuse/Neglect Assessment (Assessment to be complete while patient is alone) Physical Abuse: Denies Verbal  Abuse: Denies Sexual Abuse: Denies Exploitation of patient/patient's resources: Denies Self-Neglect: Denies     Merchant navy officer (For Healthcare) Advance Directive: Patient does not have advance directive;Patient would not like information    Additional Information 1:1 In Past 12 Months?: No CIRT Risk: No Elopement Risk: No Does patient have medical clearance?: Yes     Disposition:  Disposition Disposition of Patient: Other dispositions Type of inpatient treatment program: Adult Other disposition(s): To current provider;Other (Comment) Patient referred to: Other (Comment) (Telepsychiatry)  On Site Evaluation by:   Reviewed with Physician:  Dr. Majel Homer, Berna Spare Ray 02/05/2012 12:48 AM

## 2012-02-05 NOTE — ED Notes (Signed)
Provided pt with several blankets per pt request.  Pt denies any other needs at this time.
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
## 2012-02-05 NOTE — ED Notes (Signed)
Pt finished with telepsych.  Pt now resting in stretcher in NAD, respirations even and unlabored.

## 2012-02-05 NOTE — ED Notes (Signed)
Pt. s boyfriend came in to take pt. Home.  Reviewed instructions with pt.'s boyfriend.  He verbalized understanding.  Pt. Did drink her coffee and orange juice.  Pt. Stable, calm and cooperative upon discharge.

## 2012-02-05 NOTE — ED Notes (Signed)
Pt sitting in stretcher with friend at bedside.  Pt eating and drinking and denies any needs at this time.

## 2012-02-09 ENCOUNTER — Emergency Department (HOSPITAL_COMMUNITY)
Admission: EM | Admit: 2012-02-09 | Discharge: 2012-02-09 | Disposition: A | Discharge status: Home / Self Care | Payer: Medicaid Other | Source: Home / Self Care | Attending: Emergency Medicine | Admitting: Emergency Medicine

## 2012-02-09 ENCOUNTER — Encounter (HOSPITAL_COMMUNITY): Payer: Self-pay | Admitting: *Deleted

## 2012-02-09 ENCOUNTER — Emergency Department (HOSPITAL_COMMUNITY)
Admission: EM | Admit: 2012-02-09 | Discharge: 2012-02-09 | Disposition: A | Discharge status: Home / Self Care | Payer: Medicaid Other | Attending: Emergency Medicine | Admitting: Emergency Medicine

## 2012-02-09 ENCOUNTER — Emergency Department (HOSPITAL_COMMUNITY): Payer: Medicaid Other

## 2012-02-09 DIAGNOSIS — E039 Hypothyroidism, unspecified: Secondary | ICD-10-CM | POA: Insufficient documentation

## 2012-02-09 DIAGNOSIS — R350 Frequency of micturition: Secondary | ICD-10-CM | POA: Insufficient documentation

## 2012-02-09 DIAGNOSIS — F419 Anxiety disorder, unspecified: Secondary | ICD-10-CM

## 2012-02-09 DIAGNOSIS — R5383 Other fatigue: Secondary | ICD-10-CM | POA: Insufficient documentation

## 2012-02-09 DIAGNOSIS — K5900 Constipation, unspecified: Secondary | ICD-10-CM | POA: Insufficient documentation

## 2012-02-09 DIAGNOSIS — F411 Generalized anxiety disorder: Secondary | ICD-10-CM | POA: Insufficient documentation

## 2012-02-09 DIAGNOSIS — F3289 Other specified depressive episodes: Secondary | ICD-10-CM | POA: Insufficient documentation

## 2012-02-09 DIAGNOSIS — E079 Disorder of thyroid, unspecified: Secondary | ICD-10-CM | POA: Insufficient documentation

## 2012-02-09 DIAGNOSIS — Z79899 Other long term (current) drug therapy: Secondary | ICD-10-CM | POA: Insufficient documentation

## 2012-02-09 DIAGNOSIS — R109 Unspecified abdominal pain: Secondary | ICD-10-CM | POA: Insufficient documentation

## 2012-02-09 DIAGNOSIS — F341 Dysthymic disorder: Secondary | ICD-10-CM | POA: Insufficient documentation

## 2012-02-09 DIAGNOSIS — M549 Dorsalgia, unspecified: Secondary | ICD-10-CM | POA: Insufficient documentation

## 2012-02-09 DIAGNOSIS — R5381 Other malaise: Secondary | ICD-10-CM | POA: Insufficient documentation

## 2012-02-09 DIAGNOSIS — F329 Major depressive disorder, single episode, unspecified: Secondary | ICD-10-CM | POA: Insufficient documentation

## 2012-02-09 LAB — CBC
HCT: 35.5 % — ABNORMAL LOW (ref 36.0–46.0)
HCT: 38.1 % (ref 36.0–46.0)
Hemoglobin: 11.7 g/dL — ABNORMAL LOW (ref 12.0–15.0)
Hemoglobin: 12.7 g/dL (ref 12.0–15.0)
MCH: 28.7 pg (ref 26.0–34.0)
MCH: 29.3 pg (ref 26.0–34.0)
MCHC: 33 g/dL (ref 30.0–36.0)
MCHC: 33.3 g/dL (ref 30.0–36.0)
MCV: 87.2 fL (ref 78.0–100.0)
MCV: 88 fL (ref 78.0–100.0)
Platelets: 222 10*3/uL (ref 150–400)
Platelets: 245 10*3/uL (ref 150–400)
RBC: 4.07 MIL/uL (ref 3.87–5.11)
RBC: 4.33 MIL/uL (ref 3.87–5.11)
RDW: 13.6 % (ref 11.5–15.5)
RDW: 13.5 % (ref 11.5–15.5)
WBC: 6.5 10*3/uL (ref 4.0–10.5)
WBC: 5.9 10*3/uL (ref 4.0–10.5)

## 2012-02-09 LAB — URINALYSIS, ROUTINE W REFLEX MICROSCOPIC
Bilirubin Urine: NEGATIVE
Glucose, UA: NEGATIVE mg/dL
Hgb urine dipstick: NEGATIVE
Ketones, ur: NEGATIVE mg/dL
Nitrite: NEGATIVE
Protein, ur: NEGATIVE mg/dL
Specific Gravity, Urine: 1.013 (ref 1.005–1.030)
Urobilinogen, UA: 0.2 mg/dL (ref 0.0–1.0)
pH: 7 (ref 5.0–8.0)

## 2012-02-09 LAB — RAPID URINE DRUG SCREEN, HOSP PERFORMED
Amphetamines: NOT DETECTED
Barbiturates: NOT DETECTED
Benzodiazepines: POSITIVE — AB
Cocaine: NOT DETECTED
Opiates: NOT DETECTED
Tetrahydrocannabinol: NOT DETECTED

## 2012-02-09 LAB — COMPREHENSIVE METABOLIC PANEL
ALT: 18 U/L (ref 0–35)
AST: 21 U/L (ref 0–37)
Albumin: 3.8 g/dL (ref 3.5–5.2)
Alkaline Phosphatase: 143 U/L — ABNORMAL HIGH (ref 39–117)
BUN: 15 mg/dL (ref 6–23)
CO2: 31 mEq/L (ref 19–32)
Calcium: 9.6 mg/dL (ref 8.4–10.5)
Chloride: 100 mEq/L (ref 96–112)
Creatinine, Ser: 0.77 mg/dL (ref 0.50–1.10)
GFR calc Af Amer: >90 mL/min (ref >90)
GFR calc non Af Amer: >90 mL/min (ref >90)
Glucose, Bld: 98 mg/dL (ref 70–99)
Potassium: 4 mEq/L (ref 3.5–5.1)
Sodium: 138 mEq/L (ref 135–145)
Total Bilirubin: 0.2 mg/dL — ABNORMAL LOW (ref 0.3–1.2)
Total Protein: 7.6 g/dL (ref 6.0–8.3)

## 2012-02-09 LAB — DIFFERENTIAL
Basophils Absolute: 0.1 10*3/uL (ref 0.0–0.1)
Basophils Relative: 1 % (ref 0–1)
Eosinophils Absolute: 0.4 10*3/uL (ref 0.0–0.7)
Eosinophils Relative: 6 % — ABNORMAL HIGH (ref 0–5)
Lymphocytes Relative: 34 % (ref 12–46)
Lymphs Abs: 2.2 10*3/uL (ref 0.7–4.0)
Monocytes Absolute: 0.7 10*3/uL (ref 0.1–1.0)
Monocytes Relative: 10 % (ref 3–12)
Neutro Abs: 3.2 10*3/uL (ref 1.7–7.7)
Neutrophils Relative %: 49 % (ref 43–77)

## 2012-02-09 LAB — URINE MICROSCOPIC-ADD ON

## 2012-02-09 LAB — BASIC METABOLIC PANEL
BUN: 16 mg/dL (ref 6–23)
CO2: 30 mEq/L (ref 19–32)
Calcium: 9.6 mg/dL (ref 8.4–10.5)
Chloride: 101 mEq/L (ref 96–112)
Creatinine, Ser: 0.79 mg/dL (ref 0.50–1.10)
GFR calc Af Amer: >90 mL/min (ref >90)
GFR calc non Af Amer: >90 mL/min (ref >90)
Glucose, Bld: 79 mg/dL (ref 70–99)
Potassium: 4.3 mEq/L (ref 3.5–5.1)
Sodium: 139 mEq/L (ref 135–145)

## 2012-02-09 LAB — ETHANOL: Alcohol, Ethyl (B): <11 mg/dL (ref 0–11)

## 2012-02-09 LAB — ACETAMINOPHEN LEVEL: Acetaminophen (Tylenol), Serum: <15 ug/mL (ref 10–30)

## 2012-02-09 MED ORDER — POLYETHYLENE GLYCOL 3350 17 GM/SCOOP PO POWD: 17.0000 g | Freq: Every day | ORAL | Status: DC

## 2012-02-09 MED ORDER — LORAZEPAM 2 MG/ML IJ SOLN
2.0000 mg | Freq: Once | INTRAMUSCULAR | Status: AC
  Administered 2012-02-09: 2 mg via INTRAMUSCULAR
  Filled 2012-02-09: qty 1

## 2012-02-09 NOTE — Discharge Instructions (Signed)
Anxiety and Panic Attacks Anxiety is your body's way of reacting to real danger or something you think is a danger. It may be fear or worry over a situation like losing your job. Sometimes the cause is not known. A panic attack is made up of physical signs like sweating, shaking, or chest pain. Anxiety and panic attacks may start suddenly. They may be strong. They may come at any time of day, even while sleeping. They may come at any time of life. Panic attacks are scary, but they do not harm you physically.  HOME CARE  Avoid any known causes of your anxiety.   Try to relax. Yoga may help. Tell yourself everything will be okay.   Exercise often.   Get expert advice and help (therapy) to stop anxiety or attacks from happening.   Avoid caffeine, alcohol, and drugs.   Only take medicine as told by your doctor.  GET HELP RIGHT AWAY IF:  Your attacks seem different than normal attacks.   Your problems are getting worse or concern you.  MAKE SURE YOU:  Understand these instructions.   Will watch your condition.   Will get help right away if you are not doing well or get worse.  Document Released: 11/02/2010 Document Revised: 09/19/2011 Document Reviewed: 11/02/2010 [employer] Patient Information 2012 [employer]. 

## 2012-02-09 NOTE — ED Notes (Signed)
MD in to see pt.

## 2012-02-09 NOTE — Discharge Instructions (Signed)
Please read over the instructions below. Your bloodwork and urine test tonight are normal. The xray of your abdomen shows that you have a large amount of stool in your colon. This would suggest constipation which is the likely the source of your abdominal pain. Take the Miralax as directed. Drink lots of fluids and increase the fiber in your diet. A lot of the medications your are on can worsen constipation so you need to take measures suggested below to prevent it. Return for worsening symptoms, otherwise arrange follow up with Dr Valetta Fuller for a recheck this week.   Constipation Constipation is when you poop (have a bowel movement) less than 3 times a week. Sometimes the poop (stool) is small and hard. There are many different causes of constipation.  HOME CARE   Go to the bathroom when you feel the urge to go.   Drink enough fluid to keep your pee (urine) clear or pale yellow.   Eat more high-fiber foods.   Drink prune juice or eat stewed fruits in the morning.   Exercise.   Ask your doctor if you should take medicine to help you poop or take fiber supplements.  GET HELP RIGHT AWAY IF:   You see blood in your poop or in the toilet.   Your belly (abdomen) gets hard and puffy (swollen).   You keep throwing up (vomiting).   You have a lot of pain.  MAKE SURE YOU:   Understand these instructions.   Will watch your condition.   Will get help right away if you are not doing well or get worse.  Document Released: 03/18/2008 Document Revised: 09/19/2011 Document Reviewed: 09/03/2011 [facility] Patient Information 2012 [place], [place].Fiber Content in Foods Drinking plenty of fluids and consuming foods high in fiber can help with constipation. See the list below for the fiber content of some common foods. Starches and Grains / Dietary Fiber (g)  Cheerios, 1 cup / 3 g   Kellogg's Corn Flakes, 1 cup / 0.7 g   Rice Krispies, 1  cup / 0.3 g   Quaker Oat Life Cereal,  cup / 2.1 g     Oatmeal, instant (cooked),  cup / 2 g   Kellogg's Frosted Mini Wheats, 1 cup / 5.1 g   Rice, brown, long-grain (cooked), 1 cup / 3.5 g   Rice, white, long-grain (cooked), 1 cup / 0.6 g   Macaroni, cooked, enriched, 1 cup / 2.5 g  Legumes / Dietary Fiber (g)  Beans, baked, canned, plain or vegetarian,  cup / 5.2 g   Beans, kidney, canned,  cup / 6.8 g   Beans, pinto, dried (cooked),  cup / 7.7 g   Beans, pinto, canned,  cup / 5.5 g  Breads and Crackers / Dietary Fiber (g)  Graham crackers, plain or honey, 2 squares / 0.7 g   Saltine crackers, 3 squares / 0.3 g   Pretzels, plain, salted, 10 pieces / 1.8 g   Bread, whole-wheat, 1 slice / 1.9 g   Bread, white, 1 slice / 0.7 g   Bread, raisin, 1 slice / 1.2 g   Bagel, plain, 3 oz / 2 g   Tortilla, flour, 1 oz / 0.9 g   Tortilla, corn, 1 small / 1.5 g   Bun, hamburger or hotdog, 1 small / 0.9 g  Fruits / Dietary Fiber (g)  Apple, raw with skin, 1 medium / 4.4 g   Applesauce, sweetened,  cup / 1.5 g   Banana,  medium / 1.5 g   Grapes, 10 grapes / 0.4 g   Orange, 1 small / 2.3 g   Raisin, 1.5 oz / 1.6 g   Melon, 1 cup / 1.4 g  Vegetables / Dietary Fiber (g)  Green beans, canned,  cup / 1.3 g   Carrots (cooked),  cup / 2.3 g   Broccoli (cooked),  cup / 2.8 g   Peas, frozen (cooked),  cup / 4.4 g   Potatoes, mashed,  cup / 1.6 g   Lettuce, 1 cup / 0.5 g   Corn, canned,  cup / 1.6 g   Tomato,  cup / 1.1 g  Document Released: 02/16/2007 Document Revised: 09/19/2011 Document Reviewed: 04/13/2007 [facility] Patient Information 2012 [place], [place].Fiber Content in Foods Drinking plenty of fluids and consuming foods high in fiber can help with constipation. See the list below for the fiber content of some common foods. Starches and Grains / Dietary Fiber (g)  Cheerios, 1 cup / 3 g   Kellogg's Corn Flakes, 1 cup / 0.7 g   Rice Krispies, 1  cup / 0.3 g   Quaker Oat Life Cereal,  cup /  2.1 g   Oatmeal, instant (cooked),  cup / 2 g   Kellogg's Frosted Mini Wheats, 1 cup / 5.1 g   Rice, brown, long-grain (cooked), 1 cup / 3.5 g   Rice, white, long-grain (cooked), 1 cup / 0.6 g   Macaroni, cooked, enriched, 1 cup / 2.5 g  Legumes / Dietary Fiber (g)  Beans, baked, canned, plain or vegetarian,  cup / 5.2 g   Beans, kidney, canned,  cup / 6.8 g   Beans, pinto, dried (cooked),  cup / 7.7 g   Beans, pinto, canned,  cup / 5.5 g  Breads and Crackers / Dietary Fiber (g)  Graham crackers, plain or honey, 2 squares / 0.7 g   Saltine crackers, 3 squares / 0.3 g   Pretzels, plain, salted, 10 pieces / 1.8 g   Bread, whole-wheat, 1 slice / 1.9 g   Bread, white, 1 slice / 0.7 g   Bread, raisin, 1 slice / 1.2 g   Bagel, plain, 3 oz / 2 g   Tortilla, flour, 1 oz / 0.9 g   Tortilla, corn, 1 small / 1.5 g   Bun, hamburger or hotdog, 1 small / 0.9 g  Fruits / Dietary Fiber (g)  Apple, raw with skin, 1 medium / 4.4 g   Applesauce, sweetened,  cup / 1.5 g   Banana,  medium / 1.5 g   Grapes, 10 grapes / 0.4 g   Orange, 1 small / 2.3 g   Raisin, 1.5 oz / 1.6 g   Melon, 1 cup / 1.4 g  Vegetables / Dietary Fiber (g)  Green beans, canned,  cup / 1.3 g   Carrots (cooked),  cup / 2.3 g   Broccoli (cooked),  cup / 2.8 g   Peas, frozen (cooked),  cup / 4.4 g   Potatoes, mashed,  cup / 1.6 g   Lettuce, 1 cup / 0.5 g   Corn, canned,  cup / 1.6 g   Tomato,  cup / 1.1 g  Document Released: 02/16/2007 Document Revised: 09/19/2011 Document Reviewed: 04/13/2007 [facility] Patient Information 2012 [place], [place].

## 2012-02-09 NOTE — ED Notes (Signed)
Patient with multiple complaints of back pain, trouble sleeping and urinary incontinence.  Patient does not speak for herself but with only one word sentences, husband speaks for her.  Patient is sleepy and falling asleep while being spoken to.  Patient is anxious without husband at bedside.

## 2012-02-09 NOTE — ED Notes (Signed)
Pt up to bathroom.

## 2012-02-09 NOTE — ED Notes (Signed)
Pt states "I'm having a nervous break down" Pt with pressured speech and appears anxious.

## 2012-02-09 NOTE — ED Provider Notes (Signed)
History     CSN: [id number]  Arrival date & time 02/09/12  0144   First MD Initiated Contact with Patient 02/09/12 0214      Chief Complaint  Patient presents with  . Abdominal Pain    (Consider location/radiation/quality/duration/timing/severity/associated sxs/prior treatment) Patient is a 56 y.o. female presenting with abdominal pain. The history is provided by the patient and the spouse.  Abdominal Pain The primary symptoms of the illness include abdominal pain. The primary symptoms of the illness do not include fever, fatigue, shortness of breath, nausea, vomiting, diarrhea, hematemesis, hematochezia, dysuria, vaginal discharge or vaginal bleeding. The onset of the illness was gradual. The problem has been gradually worsening.  The patient states that she believes she is currently not pregnant. The patient has had a change in bowel habit. Additional symptoms associated with the illness include constipation, frequency and back pain. Symptoms associated with the illness do not include chills, anorexia, urgency or hematuria.  Pt states her lower abd has hurt "for a long time". Spouse states pt requested to come to hospital tonight because pain worse. He reports increased urinary frequency but pt denies pain or urgency. He states pt has not had a BM in 3 to 4 days so he fears she is constipated. Last [facility] Wednesday. Pt noted sleeping through entire interview except when unexpectedly awakens and answers a question. Spouse states pt took her Trazadone, Zyprexa and "anxiety" meds at bedtime.   Past Medical History  Diagnosis Date  . Thyroid disease   . Anxiety   . Mental disorder   . Depression   . Hepatitis   . Hypothyroidism     Past Surgical History  Procedure Date  . Arm fracture   . Rotator cuff repair     No family history on file.  History  Substance Use Topics  . Smoking status: Never Smoker   . Smokeless tobacco: Never Used  . Alcohol Use: Yes    OB History    Grav  Para Term Preterm Abortions TAB SAB Ect Mult Living                  Review of Systems  Constitutional: Negative for fever, chills and fatigue.  HENT: Negative.   Eyes: Negative.   Respiratory: Negative.  Negative for shortness of breath.   Cardiovascular: Negative for chest pain.  Gastrointestinal: Positive for abdominal pain and constipation. Negative for nausea, vomiting, diarrhea, hematochezia, anorexia and hematemesis.  Genitourinary: Positive for frequency. Negative for dysuria, urgency, hematuria, vaginal bleeding and vaginal discharge.  Musculoskeletal: Positive for back pain.  Neurological: Negative.   Hematological: Negative.   Psychiatric/Behavioral: Negative.     Allergies  Sulfa antibiotics  Home Medications   Current Outpatient Rx  Name Route Sig Dispense Refill  . CYANOCOBALAMIN 100 MCG PO TABS Oral Take 100 mcg by mouth daily.     Marland Kitchen LEVOTHYROXINE SODIUM 75 MCG PO TABS Oral Take 75 mcg by mouth daily.    . TRAZODONE HCL 100 MG PO TABS Oral Take 100 mg by mouth at bedtime as needed. For sleep    . ZIPRASIDONE HCL 80 MG PO CAPS Oral Take 80 mg by mouth 2 (two) times daily with a meal.       BP 126/72  Pulse 100  Temp(Src) 98 F (36.7 C) (Oral)  Resp 20  SpO2 98%  Physical Exam  Constitutional: She is oriented to person, place, and time. She appears well-developed and well-nourished. She appears lethargic. She is easily aroused.  HENT:  Head: Normocephalic and atraumatic.  Eyes: Conjunctivae are normal.  Cardiovascular: Normal rate and regular rhythm.   Pulmonary/Chest: Effort normal and breath sounds normal.  Abdominal: Soft. Bowel sounds are normal.       Mil;d diffuse lower abd TTP  Neurological: She is oriented to person, place, and time and easily aroused. She appears lethargic.  Skin: Skin is warm and dry.    ED Course  Procedures  Pt noted to be sleeping on all reassessments. Findings and clinical impression discussed w/ pt. Will plan for d/c  home w/ tx for constipation and encourage close f/u w/ her PCP. Pt agreeable w/ plan.   Labs Reviewed  CBC - Abnormal; Notable for the following:    Hemoglobin 11.7 (*)    HCT 35.5 (*)    All other components within normal limits  DIFFERENTIAL - Abnormal; Notable for the following:    Eosinophils Relative 6 (*)    All other components within normal limits  URINALYSIS, ROUTINE W REFLEX MICROSCOPIC - Abnormal; Notable for the following:    Leukocytes, UA TRACE (*)    All other components within normal limits  BASIC METABOLIC PANEL  URINE MICROSCOPIC-ADD ON   Dg Abd 1 View  02/09/2012  *RADIOLOGY REPORT*  Clinical Data: Abdominal pain, vomiting, constipation.  ABDOMEN - 1 VIEW  Comparison: 05/31/2010 CT  Findings: Moderate stool burden.  Nonobstructive bowel gas pattern. Hemidiaphragms are excluded from the image.  No acute osseous abnormality.  Organ outlines normal where seen.  IMPRESSION: Moderate stool burden.  Nonobstructive bowel gas pattern.  Original Report Authenticated By: Waneta Martins, M.D.     No diagnosis found.    MDM  HPI/PE and clinical findings c/w 1.Constipation        Leanne Chang, NP 02/09/12 [phone]

## 2012-02-09 NOTE — ED Notes (Signed)
The pt is c/o pain in her abd for a long time she cannot tell me how long.  She is very vague.  Unsure if she has n or v

## 2012-02-09 NOTE — ED Notes (Signed)
Pt seen by MD he's discharging home.

## 2012-02-09 NOTE — ED Provider Notes (Signed)
History    56 year old female with anxiety. Patient states that she feels anxious and "does not know what to do.". She has no thoughts of self-harm. She states "I want to live." No homicidal ideation. No hallucinations. Patient reports compliance with her medications. CSN: [id number]  Arrival date & time 02/09/12  1316   First MD Initiated Contact with Patient 02/09/12 1402      Chief Complaint  Patient presents with  . Anxiety    (Consider location/radiation/quality/duration/timing/severity/associated sxs/prior treatment) HPI  Past Medical History  Diagnosis Date  . Thyroid disease   . Anxiety   . Mental disorder   . Depression   . Hepatitis   . Hypothyroidism     Past Surgical History  Procedure Date  . Arm fracture   . Rotator cuff repair     History reviewed. No pertinent family history.  History  Substance Use Topics  . Smoking status: Never Smoker   . Smokeless tobacco: Never Used  . Alcohol Use: Yes    OB History    Grav Para Term Preterm Abortions TAB SAB Ect Mult Living                  Review of Systems   Review of symptoms negative unless otherwise noted in HPI.   Allergies  Sulfa antibiotics  Home Medications   Current Outpatient Rx  Name Route Sig Dispense Refill  . CYANOCOBALAMIN 100 MCG PO TABS Oral Take 100 mcg by mouth daily.     Marland Kitchen LEVOTHYROXINE SODIUM 75 MCG PO TABS Oral Take 75 mcg by mouth daily.    Marland Kitchen OLANZAPINE 5 MG PO TABS Oral Take 20 mg by mouth at bedtime.    Marland Kitchen POLYETHYLENE GLYCOL 3350 PO PACK Oral Take 17 g by mouth daily.    . TRAZODONE HCL 100 MG PO TABS Oral Take 100 mg by mouth at bedtime as needed. For sleep      BP 153/78  Pulse 101  Temp(Src) 98.7 F (37.1 C) (Oral)  Resp 25  SpO2 100%  Physical Exam  Nursing note and vitals reviewed. Constitutional: She appears well-developed and well-nourished. No distress.  HENT:  Head: Normocephalic and atraumatic.  Eyes: Conjunctivae are normal. Right eye exhibits no  discharge. Left eye exhibits no discharge.  Neck: Neck supple.  Cardiovascular: Regular rhythm and normal heart sounds.  Exam reveals no gallop and no friction rub.   No murmur heard.      Mildly tachycardic  Pulmonary/Chest: Effort normal and breath sounds normal. No respiratory distress.  Abdominal: Soft. She exhibits no distension. There is no tenderness.  Musculoskeletal: She exhibits no edema and no tenderness.  Neurological: She is alert.  Skin: Skin is warm and dry. She is not diaphoretic.  Psychiatric:       Somewhat pressured speech and perseverance. Laughing inappropriately at times.     ED Course  Procedures (including critical care time)  Labs Reviewed  COMPREHENSIVE METABOLIC PANEL - Abnormal; Notable for the following:    Alkaline Phosphatase 143 (*)    Total Bilirubin 0.2 (*)    All other components within normal limits  URINE RAPID DRUG SCREEN (HOSP PERFORMED) - Abnormal; Notable for the following:    Benzodiazepines POSITIVE (*)    All other components within normal limits  CBC  ETHANOL  ACETAMINOPHEN LEVEL  LAB REPORT - SCANNED   Dg Abd 1 View  02/09/2012  *RADIOLOGY REPORT*  Clinical Data: Abdominal pain, vomiting, constipation.  ABDOMEN - 1 VIEW  Comparison: 05/31/2010 CT  Findings: Moderate stool burden.  Nonobstructive bowel gas pattern. Hemidiaphragms are excluded from the image.  No acute osseous abnormality.  Organ outlines normal where seen.  IMPRESSION: Moderate stool burden.  Nonobstructive bowel gas pattern.  Original Report Authenticated By: Waneta Martins, M.D.     1. Anxiety       MDM  55yF with anxiety. Pt says she just wants something to calm her and to go home. She denies SI or HI. Do not find basis to IVC her at this time and pt does not want to speak to ACT. Will DC.        Raeford Razor, MD 02/11/12 [phone]

## 2012-02-09 NOTE — ED Provider Notes (Signed)
Medical screening examination/treatment/procedure(s) were performed by non-physician practitioner and as supervising physician I was immediately available for consultation/collaboration.  Olivia Mackie, MD 02/09/12 [phone]

## 2012-02-09 NOTE — ED Notes (Signed)
Pt happy to be discharging home she says her and her boyfriend have been together off and on 30 years and in between her two marriages. Pt reports the boyfriend takes good care of her all the time and puts up with her outbursts and going to jail for two weeks for no reason.

## 2012-02-09 NOTE — ED Notes (Signed)
Pt up to nursing station asking for a coke 

## 2012-02-09 NOTE — ED Notes (Signed)
Report called to [place], RN in [place] Ed, pt to go to #37. 1 bag of belonging sent to locker #37.

## 2012-02-09 NOTE — ED Notes (Signed)
Pt walked up to the d/c window.

## 2012-02-10 ENCOUNTER — Encounter (HOSPITAL_COMMUNITY): Payer: Self-pay

## 2012-02-10 ENCOUNTER — Emergency Department (HOSPITAL_COMMUNITY)
Admission: EM | Admit: 2012-02-10 | Discharge: 2012-02-10 | Disposition: A | Discharge status: Home / Self Care | Payer: Medicaid Other | Attending: Emergency Medicine | Admitting: Emergency Medicine

## 2012-02-10 DIAGNOSIS — F250 Schizoaffective disorder, bipolar type: Secondary | ICD-10-CM

## 2012-02-10 DIAGNOSIS — E079 Disorder of thyroid, unspecified: Secondary | ICD-10-CM | POA: Insufficient documentation

## 2012-02-10 DIAGNOSIS — F259 Schizoaffective disorder, unspecified: Secondary | ICD-10-CM | POA: Insufficient documentation

## 2012-02-10 DIAGNOSIS — M549 Dorsalgia, unspecified: Secondary | ICD-10-CM | POA: Insufficient documentation

## 2012-02-10 LAB — COMPREHENSIVE METABOLIC PANEL
Albumin: 3.3 g/dL — ABNORMAL LOW (ref 3.5–5.2)
Alkaline Phosphatase: 124 U/L — ABNORMAL HIGH (ref 39–117)
BUN: 19 mg/dL (ref 6–23)
CO2: 24 mEq/L (ref 19–32)
Chloride: 103 mEq/L (ref 96–112)
GFR calc Af Amer: >90 mL/min (ref >90)
Glucose, Bld: 100 mg/dL — ABNORMAL HIGH (ref 70–99)
Potassium: 4.4 mEq/L (ref 3.5–5.1)
Total Bilirubin: 0.2 mg/dL — ABNORMAL LOW (ref 0.3–1.2)

## 2012-02-10 LAB — CBC
HCT: 33.8 % — ABNORMAL LOW (ref 36.0–46.0)
Hemoglobin: 11.3 g/dL — ABNORMAL LOW (ref 12.0–15.0)
RBC: 3.9 MIL/uL (ref 3.87–5.11)
WBC: 5.8 10*3/uL (ref 4.0–10.5)

## 2012-02-10 LAB — ETHANOL: Alcohol, Ethyl (B): <11 mg/dL (ref 0–11)

## 2012-02-10 LAB — RAPID URINE DRUG SCREEN, HOSP PERFORMED: Amphetamines: NOT DETECTED

## 2012-02-10 MED ORDER — LORAZEPAM 1 MG PO TABS
1.0000 mg | ORAL_TABLET | Freq: Once | ORAL | Status: AC
  Administered 2012-02-10: 1 mg via ORAL
  Filled 2012-02-10: qty 1

## 2012-02-10 MED ORDER — ZIPRASIDONE MESYLATE 20 MG IM SOLR
20.0000 mg | Freq: Once | INTRAMUSCULAR | Status: AC
  Administered 2012-02-10: 20 mg via INTRAMUSCULAR

## 2012-02-10 MED ORDER — ZIPRASIDONE HCL 80 MG PO CAPS: 80.0000 mg | ORAL_CAPSULE | Freq: Two times a day (BID) | ORAL | Status: DC

## 2012-02-10 MED ORDER — TRAZODONE HCL 100 MG PO TABS: 100.0000 mg | ORAL_TABLET | Freq: Every day | ORAL | Status: DC

## 2012-02-10 MED ORDER — ZIPRASIDONE MESYLATE 20 MG IM SOLR
INTRAMUSCULAR | Status: AC
  Filled 2012-02-10: qty 20

## 2012-02-10 NOTE — ED Notes (Signed)
Pt brought in by [employer], d/t went to pts house for trespassing and pt states "I don't want to go to jail, I have a bladder infection, take me to the hospital."; pt states "my back hurts, my anxiety it really bad, give me my blue scrubs."; pt at triage desk naked, pt escorted back to her room and changed in scrubs.

## 2012-02-10 NOTE — Consult Note (Signed)
Reason for Consult: Schizoaffective disorder, Bipolar disorder and anxiety Referring Physician: Loretta Pace physician  Rebekah Pace is an 56 y.o. female.  HPI: This is a single Caucasian female who was known to the [facility] under emergency department from her multiple previous visits/admissions. Patient has been diagnosed with her chronic schizoaffective disorder with the multiple episodes of bipolar mania, hypothyroidism, hepatitis C and unknown anxiety disorder. Patient has been admitted to [facility] at least 4 times in the last 16 months for similar episodes. Patient was [facility] to [facility] in March 2013 and than referred to the outpatient psychiatrist and with the combination of the act team on board. Patient reported her A C T team was recommending lithium for her and taken out Geodon. Patient refused to take lithium. She does not want to because he heard bad things about it. Patient was frustrated to get services from the [place] because they don't understand her and unable to stay calm during waiting. Patient boyfriend was concerned about her and has a limited resources to help her. Her urine screen was positive for benzodiazepines  Patient was in the [facility] department yesterday for stomach pain and received medication for constipation. Patient reported she has appointment with primary care physician for her bladder infection. Patient reported she has to use the urination more frequently.  Patient was appeared staying in her room calm and quite. Patient was quite manic with mood swings, pressured speech and perseverance of speech. Patient has Echolalia during this evaluation. Her affect was quite labile with the irritability anxiety and tearfulness at the same time. Patient has denied suicidal, homicidal ideations, intentions and plans. She is no evidence of auditory or visual hallucinations. Patient reportedly mild paranoia  about police and stated she does not want to go to jail again. She has a fair to poor insight, judgment and impulse control.  Past Medical History  Diagnosis Date  . Thyroid disease   . Anxiety   . Mental disorder   . Depression   . Hepatitis   . Hypothyroidism     Past Surgical History  Procedure Date  . Arm fracture   . Rotator cuff repair     No family history on file.  Social History:  reports that she has never smoked. She has never used smokeless tobacco. She reports that she drinks alcohol. She reports that she does not use illicit drugs.  Allergies:  Allergies  Allergen Reactions  . Sulfa Antibiotics Rash    Medications: I have reviewed the patient's current medications.  Results for orders placed during the hospital encounter of 02/10/12 (from the past 48 hour(s))  URINE RAPID DRUG SCREEN (HOSP PERFORMED)     Status: Abnormal   Collection Time   02/10/12 11:53 AM      Component Value Range Comment   Opiates NONE DETECTED  NONE DETECTED     Cocaine NONE DETECTED  NONE DETECTED     Benzodiazepines POSITIVE (*) NONE DETECTED     Amphetamines NONE DETECTED  NONE DETECTED     Tetrahydrocannabinol NONE DETECTED  NONE DETECTED     Barbiturates NONE DETECTED  NONE DETECTED    CBC     Status: Abnormal   Collection Time   02/10/12 12:13 PM      Component Value Range Comment   WBC 5.8  4.0 - 10.5 (K/uL)    RBC 3.90  3.87 - 5.11 (MIL/uL)    Hemoglobin 11.3 (*)  12.0 - 15.0 (g/dL)    HCT 16.1 (*) 09.6 - 46.0 (%)    MCV 86.7  78.0 - 100.0 (fL)    MCH 29.0  26.0 - 34.0 (pg)    MCHC 33.4  30.0 - 36.0 (g/dL)    RDW 04.5  40.9 - 81.1 (%)    Platelets 223  150 - 400 (K/uL)   COMPREHENSIVE METABOLIC PANEL     Status: Abnormal   Collection Time   02/10/12 12:13 PM      Component Value Range Comment   Sodium 138  135 - 145 (mEq/L)    Potassium 4.4  3.5 - 5.1 (mEq/L)    Chloride 103  96 - 112 (mEq/L)    CO2 24  19 - 32 (mEq/L)    Glucose, Bld 100 (*) 70 - 99 (mg/dL)     BUN 19  6 - 23 (mg/dL)    Creatinine, Ser 9.14  0.50 - 1.10 (mg/dL)    Calcium 9.2  8.4 - 10.5 (mg/dL)    Total Protein 6.6  6.0 - 8.3 (g/dL)    Albumin 3.3 (*) 3.5 - 5.2 (g/dL)    AST 31  0 - 37 (U/L)    ALT 22  0 - 35 (U/L)    Alkaline Phosphatase 124 (*) 39 - 117 (U/L)    Total Bilirubin 0.2 (*) 0.3 - 1.2 (mg/dL)    GFR calc non Af Amer >90  >90 (mL/min)    GFR calc Af Amer >90  >90 (mL/min)   ETHANOL     Status: Normal   Collection Time   02/10/12 12:13 PM      Component Value Range Comment   Alcohol, Ethyl (B) <11  0 - 11 (mg/dL)     Dg Abd 1 View  7/82/9562  *RADIOLOGY REPORT*  Clinical Data: Abdominal pain, vomiting, constipation.  ABDOMEN - 1 VIEW  Comparison: 05/31/2010 CT  Findings: Moderate stool burden.  Nonobstructive bowel gas pattern. Hemidiaphragms are excluded from the image.  No acute osseous abnormality.  Organ outlines normal where seen.  IMPRESSION: Moderate stool burden.  Nonobstructive bowel gas pattern.  Original Report Authenticated By: Waneta Martins, M.D.    No depression, No psychosis and Positive for anxiety, bad mood, bipolar, depression, mood swings and sleep disturbance Blood pressure 140/87, pulse 103, temperature 98.3 F (36.8 C), temperature source Oral, resp. rate 18, SpO2 100.00%.   Assessment/Plan: Schizoaffective disorder, most recent episode bipolar Noncompliance with medications 22 placing in jail for 2 weeks Inadequate followup from the act team member as per report   Patient refused inpatient hospitalization and requested medication to follow up with the act team  Patient provided consent to speak with her act team members [name] and our [name] and also her boyfriend [name] at [phone] Give Geodon 20 mg IM and Ativan 1 mg by mouth now to control anxiety and manic symptoms  Patient will need Geodon 80 mg twice daily and trazodone 100 mg at bedtime for at least 2 weeks supply and followup from the act team at  home.   [name],JANARDHAHA R. 02/10/2012, 2:13 PM

## 2012-02-10 NOTE — ED Provider Notes (Signed)
History     CSN: [id number]  Arrival date & time 02/10/12  1135   None     Chief Complaint  Patient presents with  . Medical Clearance  . Cystitis  . Back Pain    (Consider location/radiation/quality/duration/timing/severity/associated sxs/prior treatment) HPI Patient brought in by [facility]. They state that she was trespassing and her neighbor's house. They picked her up or to take her to jail. She told them she had a bladder infection need to be brought to the hospital. They brought her here to the psychiatric emergency department. She is well-known here. She states that her anxiety is really bad and to give her some blue scrubs at triage desk. She tells me that her anxiety is severe but is unable to elaborate on this. She denies any suicidal ideation, homicidal ideation, or psychoses. Past Medical History  Diagnosis Date  . Thyroid disease   . Anxiety   . Mental disorder   . Depression   . Hepatitis   . Hypothyroidism     Past Surgical History  Procedure Date  . Arm fracture   . Rotator cuff repair     No family history on file.  History  Substance Use Topics  . Smoking status: Never Smoker   . Smokeless tobacco: Never Used  . Alcohol Use: Yes    OB History    Grav Para Term Preterm Abortions TAB SAB Ect Mult Living                  Review of Systems  All other systems reviewed and are negative.    Allergies  Sulfa antibiotics  Home Medications   Current Outpatient Rx  Name Route Sig Dispense Refill  . CYANOCOBALAMIN 100 MCG PO TABS Oral Take 100 mcg by mouth daily.     Marland Kitchen LEVOTHYROXINE SODIUM 75 MCG PO TABS Oral Take 75 mcg by mouth daily.    Marland Kitchen OLANZAPINE 5 MG PO TABS Oral Take 20 mg by mouth at bedtime.    Marland Kitchen POLYETHYLENE GLYCOL 3350 PO PACK Oral Take 17 g by mouth daily.    . TRAZODONE HCL 100 MG PO TABS Oral Take 100 mg by mouth at bedtime as needed. For sleep      BP 140/87  Pulse 103  Temp(Src) 98.3 F (36.8 C) (Oral)   Resp 18  SpO2 100%  Physical Exam  Nursing note and vitals reviewed. Constitutional: She appears well-developed and well-nourished.  HENT:  Head: Normocephalic and atraumatic.  Eyes: Conjunctivae and EOM are normal. Pupils are equal, round, and reactive to light.  Neck: Normal range of motion. Neck supple.  Cardiovascular: Normal rate, regular rhythm, normal heart sounds and intact distal pulses.   Pulmonary/Chest: Effort normal and breath sounds normal.  Abdominal: Soft. Bowel sounds are normal.  Musculoskeletal: Normal range of motion.  Neurological: She is alert.  Skin: Skin is warm and dry.  Psychiatric: Her mood appears anxious. Her affect is inappropriate. Her speech is rapid and/or pressured and tangential. She is agitated and is hyperactive. Cognition and memory are not impaired. She expresses impulsivity and inappropriate judgment. She expresses no homicidal and no suicidal ideation. She expresses no suicidal plans and no homicidal plans. She exhibits normal recent memory and normal remote memory.    ED Course  Procedures (including critical care time)  Labs Reviewed  CBC - Abnormal; Notable for the following:    Hemoglobin 11.3 (*)    HCT 33.8 (*)    All other components  within normal limits  COMPREHENSIVE METABOLIC PANEL - Abnormal; Notable for the following:    Glucose, Bld 100 (*)    Albumin 3.3 (*)    Alkaline Phosphatase 124 (*)    Total Bilirubin 0.2 (*)    All other components within normal limits  URINE RAPID DRUG SCREEN (HOSP PERFORMED) - Abnormal; Notable for the following:    Benzodiazepines POSITIVE (*)    All other components within normal limits  ETHANOL   Dg Abd 1 View  02/09/2012  *RADIOLOGY REPORT*  Clinical Data: Abdominal pain, vomiting, constipation.  ABDOMEN - 1 VIEW  Comparison: 05/31/2010 CT  Findings: Moderate stool burden.  Nonobstructive bowel gas pattern. Hemidiaphragms are excluded from the image.  No acute osseous abnormality.  Organ  outlines normal where seen.  IMPRESSION: Moderate stool burden.  Nonobstructive bowel gas pattern.  Original Report Authenticated By: Waneta Martins, M.D.     No diagnosis found.    MDM  Patient is to have tell a psychiatric consult.  Patient with some epithelial cells, few bacteria, and a few white blood cells on urine yesterday. There is trace nitrite positive. She is signed out to Dr. Rubin Payor.  She is likely able to be discharged to home if the psych consult does not suggest hospitalization.       Hilario Quarry, MD 02/10/12 1550

## 2012-02-10 NOTE — Discharge Instructions (Signed)
Schizoaffective Disorder Schizoaffective disorder is a condition with a combination of features of schizophrenia and a mood disorder. There are symptoms of a psychotic disorder such as distorted thinking, hallucinations or delusions (schizophrenia feature) and depression or mania (mood disorder feature). There are 2 subtypes based on the mood component:   Bipolar Type.   Depressive Type.  CAUSES  Schizoaffective disorder, like schizophrenia, appears to have distinct genetic links. It is unknown as to what exactly causes the disorder. Some experts believe it involves brain chemistry such as an imbalance of serotonin and dopamine in the brain. These naturally occurring chemicals help relay electronic signals in the brain and help regulate mood. Other experts have speculated whether fetal exposure to toxins or viral illness, or even birth complications may play a role. However, there is no proof of this relationship. SYMPTOMS  Symptoms are those of both schizophrenia and mood disorders. These may include:  Hearing, seeing, or feeling things that are not there (hallucinations).   False beliefs (delusions).   Not taking care of oneself (for example, not bathing or grooming).   Speaking in a way that makes no sense to others.   Withdrawing or feeling isolated from other people.   Thoughts that race from one idea to the next.   Feelings of sadness, guilt, hopelessness, and anxiety.   Feelings of being excessively happy, powerful, or energetic.   Feeling drained of energy.   Losing or gaining weight.   Being unable to concentrate.   Sleeping more or less than normal.  DIAGNOSIS  The diagnosis is made when the patient has features of both illnesses. The patient does not strictly have schizophrenia or a mood disorder alone. Unfortunately, determining if a patient has 2 separate illnesses (schizophrenia or a mood disorder), a combination of illnesses (schizophrenia and a mood disorder), or  perhaps even a separate illness apart from schizophrenia or a mood disorder is difficult. An accurate diagnosis has 3 key parts:  The patient clearly has a major depressive disorder or mania.   They meet the criteria for schizophrenia (distorted thinking, hallucinations or delusions).   The patient must have psychosis for at least 2 weeks without a mood disorder.  The diagnosis of this disorder frequently requires several evaluations. Caregivers will evaluate the patient's symptoms over a length of time. The diagnosis can be made when the following are observed:  An uninterrupted period of mental illness (possibly lasting weeks or months) occurs during which a major depressive episode, a manic episode (both are mood disorder components), or a mixed episode occurs with symptoms of schizophrenia. A major depressive episode must include a depressed mood. Also:   This uninterrupted period of illness includes a minimum 2 week episode of active distorted thinking, hallucinations or delusions (schizophrenia feature).   During this same 2 week (or greater) period, the patient has no mood changes of depression or mania.   Outside of the specific 2 week period noted above, mood episodes are present for a big part of the total active and lingering periods of illness.   The disturbance is not due to the direct effects of a substance (such as street drugs or prescription medications) or a general medical condition.   The bipolar type is diagnosed if the disturbance includes a period of mania, a major depressive episode or a mixed episode.   The depressive type is diagnosed if the problem includes only major depressive episodes.  TREATMENT  Treatment usually includes a combination of medications and counseling. The exact   regimen varies depending on the type and severity of symptoms. It also depends on whether the disorder is a depressive-type or bipolar-type. Medications are prescribed to help with  psychotic symptoms, stabilize mood and treat depression. Psychotherapy can help curb distorted thoughts, teach appropriate social skills and decrease social isolation. Medications may include:  Antipsychotics. These are also called neuroleptics. These are prescribed to help with psychotic symptoms such as delusions, paranoia and hallucinations. Common medications in this category include clozapine (Clozaril), risperidone (Risperdal) and olanzapine (Zyprexa).   Mood-stabilizing medications. When the schizoaffective disorder is bipolar-type, mood stabilizers can level out the highs and lows of bipolar disorder, also known as manic depression. People with bipolar disorder have episodes of mania and depressed mood. Examples of mood stabilizers include lithium (Eskalith, Lithobid) and divalproex (Depakote).   Antidepressants. When depression is the underlying mood disorder, antidepressants can help with feelings of sadness, hopelessness, or difficulty with sleep and concentration. Common medications include citalopram (Celexa), fluoxetine and escitalopram (Lexapro).  Non-medication treatment may include:  Psychotherapy and counseling. Building a trusting relationship in therapy can help people with schizoaffective disorder better understand their condition and feel hopeful about their future. Effective sessions focus on real-life plans, problems, and relationships. Skills and behaviors specific to the home or workplace may be discussed.   Family or group therapy. Treatment can be more effective when people with schizoaffective disorder can discuss their problems with others. Supportive group settings can help decrease social isolation and provide a reality check during periods of psychosis.  In general, people with schizoaffective disorder have a better prognosis than people with schizophrenia, but not as good as people with mood disorders only. Long-term treatment is necessary The prognosis varies from  person to person. HOME CARE INSTRUCTIONS   Take your medicine as prescribed, even when you are feeling and thinking well.   Participate in individual and group counseling as recommended by your caregiver.  SEEK MEDICAL CARE IF:   You feel that you are experiencing side effects from prescription medications.   Contact your caregiver if you feel that symptoms are worsening despite using medication as prescribed and participating in counseling/group therapy sessions.  SEEK IMMEDIATE MEDICAL CARE IF:  You feel an urge to hurt yourself or are thinking about committing suicide. This is an emergency and you should be evaluated immediately. MAKE SURE YOU:   Understand these instructions.   Will watch your condition.   Will get help right away if you are not doing well or get worse.  Document Released: 02/10/2007 Document Revised: 09/19/2011 Document Reviewed: 04/13/2007 ExitCare Patient Information 2012 [employer]. 

## 2012-02-10 NOTE — BH Assessment (Signed)
Informed by Dr. Henrene Hawking that he has evaluated patient and will discharge her to follow up with ACT services. Dr. Henrene Hawking asked that writer assist patient with obtaining a follow up appointment. Patient became belligerent, loud, and argumentative with staff. Charge nurse asking when and if patient could be discharged. Explained to charge nurse and nursing staff that she would be discharge as soon as this Clinical research associate was able to obtain patients follow up appointment with her community ACT services. Writer spoke to staff that provided patients ACT services, however; patient was already discharged by EDP without writer being able to provide appropriate discharge information.

## 2012-02-10 NOTE — ED Notes (Signed)
1 bag of belongings: 1 pair of shoes, 1 pair of jeans, 1 bra, 1 long-sleeve shirt, 1 bracelet, 1 ring. 1 bag of belongings in locker #4

## 2012-02-10 NOTE — ED Notes (Signed)
Pt. and belongings have both been wanded by security. -- Pt. In blue scrubs and red socks.

## 2012-02-10 NOTE — ED Notes (Signed)
Is here d/t anxiety, severe. Afraid she is going to have to go to jail. Did not take her meds this morning d/t BF not being there to make sure she is taking them. Telepsych to be completed. Yelling for food, drink, wanting this then that, on and off yelling at everyone, trying to get into arguments w/other patients

## 2012-02-10 NOTE — ED Notes (Signed)
1 red wallet, 1 black eyeglass case with eyeglasses and 1 black cell phone locked in locker 37.

## 2012-02-10 NOTE — ED Notes (Signed)
Escorted pt. to psych ED with 1 bag of belongings to locker # 37

## 2012-02-11 ENCOUNTER — Encounter (HOSPITAL_COMMUNITY): Payer: Self-pay | Admitting: *Deleted

## 2012-02-11 ENCOUNTER — Emergency Department (HOSPITAL_COMMUNITY)
Admission: EM | Admit: 2012-02-11 | Discharge: 2012-02-13 | Disposition: A | Discharge status: Other Acute Inpatient Hospital | Payer: Medicaid Other | Attending: Emergency Medicine | Admitting: Emergency Medicine

## 2012-02-11 DIAGNOSIS — F411 Generalized anxiety disorder: Secondary | ICD-10-CM | POA: Insufficient documentation

## 2012-02-11 DIAGNOSIS — IMO0002 Reserved for concepts with insufficient information to code with codable children: Secondary | ICD-10-CM | POA: Insufficient documentation

## 2012-02-11 DIAGNOSIS — Z79899 Other long term (current) drug therapy: Secondary | ICD-10-CM | POA: Insufficient documentation

## 2012-02-11 DIAGNOSIS — E039 Hypothyroidism, unspecified: Secondary | ICD-10-CM | POA: Insufficient documentation

## 2012-02-11 DIAGNOSIS — F29 Unspecified psychosis not due to a substance or known physiological condition: Secondary | ICD-10-CM | POA: Insufficient documentation

## 2012-02-11 MED ORDER — LEVOTHYROXINE SODIUM 75 MCG PO TABS
75.0000 ug | ORAL_TABLET | Freq: Every day | ORAL | Status: DC
  Administered 2012-02-11 – 2012-02-13 (×3): 75 ug via ORAL
  Filled 2012-02-11 (×3): qty 1

## 2012-02-11 MED ORDER — LORAZEPAM 2 MG/ML IJ SOLN
2.0000 mg | Freq: Once | INTRAMUSCULAR | Status: AC
  Administered 2012-02-11: 2 mg via INTRAMUSCULAR
  Filled 2012-02-11: qty 1

## 2012-02-11 MED ORDER — ZIPRASIDONE MESYLATE 20 MG IM SOLR
20.0000 mg | Freq: Once | INTRAMUSCULAR | Status: AC
  Administered 2012-02-11: 20 mg via INTRAMUSCULAR
  Filled 2012-02-11: qty 20

## 2012-02-11 MED ORDER — ZIPRASIDONE HCL 80 MG PO CAPS
80.0000 mg | ORAL_CAPSULE | Freq: Two times a day (BID) | ORAL | Status: DC
Start: 2012-02-11 — End: 2012-02-13
  Administered 2012-02-11 – 2012-02-13 (×5): 80 mg via ORAL
  Filled 2012-02-11: qty 4
  Filled 2012-02-11: qty 1
  Filled 2012-02-11: qty 4
  Filled 2012-02-11 (×2): qty 1

## 2012-02-11 MED ORDER — ZOLPIDEM TARTRATE 5 MG PO TABS
5.0000 mg | ORAL_TABLET | Freq: Every evening | ORAL | Status: DC | PRN
  Administered 2012-02-11 – 2012-02-12 (×2): 5 mg via ORAL
  Filled 2012-02-11 (×2): qty 1

## 2012-02-11 MED ORDER — TRAZODONE HCL 100 MG PO TABS
100.0000 mg | ORAL_TABLET | Freq: Every day | ORAL | Status: DC
  Administered 2012-02-11 – 2012-02-12 (×2): 100 mg via ORAL
  Filled 2012-02-11 (×2): qty 1

## 2012-02-11 MED ORDER — ONDANSETRON HCL 4 MG PO TABS: 4.0000 mg | ORAL_TABLET | Freq: Three times a day (TID) | ORAL | Status: DC | PRN

## 2012-02-11 MED ORDER — ACETAMINOPHEN 325 MG PO TABS
650.0000 mg | ORAL_TABLET | ORAL | Status: DC | PRN
  Administered 2012-02-13: 650 mg via ORAL
  Filled 2012-02-11: qty 2

## 2012-02-11 MED ORDER — NICOTINE 21 MG/24HR TD PT24: 21.0000 mg | MEDICATED_PATCH | Freq: Every day | TRANSDERMAL | Status: DC | PRN

## 2012-02-11 MED ORDER — OLANZAPINE 5 MG PO TABS
20.0000 mg | ORAL_TABLET | Freq: Every day | ORAL | Status: DC
  Administered 2012-02-11 – 2012-02-12 (×2): 20 mg via ORAL
  Filled 2012-02-11: qty 3
  Filled 2012-02-11: qty 1
  Filled 2012-02-11: qty 3
  Filled 2012-02-11: qty 1

## 2012-02-11 MED ORDER — IBUPROFEN 600 MG PO TABS
600.0000 mg | ORAL_TABLET | Freq: Three times a day (TID) | ORAL | Status: DC | PRN
  Administered 2012-02-12 – 2012-02-13 (×2): 600 mg via ORAL
  Filled 2012-02-11 (×2): qty 1

## 2012-02-11 MED ORDER — LORAZEPAM 2 MG/ML IJ SOLN
1.0000 mg | Freq: Once | INTRAMUSCULAR | Status: AC
  Administered 2012-02-11: 1 mg via INTRAVENOUS
  Filled 2012-02-11: qty 1

## 2012-02-11 MED ORDER — HYDROXYZINE HCL 50 MG/ML IM SOLN
100.0000 mg | Freq: Four times a day (QID) | INTRAMUSCULAR | Status: DC | PRN
Start: 2012-02-11 — End: 2012-02-13
  Administered 2012-02-11: 100 mg via INTRAMUSCULAR
  Filled 2012-02-11: qty 2

## 2012-02-11 NOTE — BH Assessment (Signed)
Assessment Note   Rebekah Pace is an 56 y.o. female. Patient presented to the ED with manic and aggressive behaviors. Patient is well known to this writer due to repeated ED visits over past month.  Patient has poor insight, impulse control and per patient's boyfriend and mother, is unmanageable at home. Patient is in need of inpatient psychiatric treatment for med stabilization and treatment. This writer attempted to assess patient, however, patient was uncooperative, screaming and yelling obscenities.    Axis I: Bipolar, Manic and Schizoaffective Disorder Axis II: Deferred Axis III:  Past Medical History  Diagnosis Date  . Thyroid disease   . Anxiety   . Mental disorder   . Depression   . Hepatitis   . Hypothyroidism    Axis IV: housing problems, other psychosocial or environmental problems and problems related to social environment Axis V: 21-30 behavior considerably influenced by delusions or hallucinations OR serious impairment in judgment, communication OR inability to function in almost all areas  Past Medical History:  Past Medical History  Diagnosis Date  . Thyroid disease   . Anxiety   . Mental disorder   . Depression   . Hepatitis   . Hypothyroidism     Past Surgical History  Procedure Date  . Arm fracture   . Rotator cuff repair     Family History: No family history on file.  Social History:  reports that she has never smoked. She has never used smokeless tobacco. She reports that she drinks alcohol. She reports that she does not use illicit drugs.  Additional Social History:    Allergies:  Allergies  Allergen Reactions  . Sulfa Antibiotics Rash    Home Medications:  (Not in a hospital admission)  OB/GYN Status:  No LMP recorded. Patient is postmenopausal.  General Assessment Data Location of Assessment: [facility] ED Living Arrangements: Alone Can pt return to current living arrangement?: Yes Admission Status: Involuntary Is patient capable of  signing voluntary admission?: No Transfer from: Acute Hospital Referral Source: Self/Family/Friend  Education Status Is patient currently in school?: No  Risk to self Suicidal Ideation: No Suicidal Intent: No Is patient at risk for suicide?: No Suicidal Plan?: No Access to Means: No Previous Attempts/Gestures: No Triggers for Past Attempts: None known Intentional Self Injurious Behavior: None Family Suicide History: No Recent stressful life event(s): Other (Comment);Conflict (Comment) (Medication) Persecutory voices/beliefs?: No Depression: Yes Depression Symptoms: Feeling angry/irritable Substance abuse history and/or treatment for substance abuse?: No Suicide prevention information given to non-admitted patients: Not applicable  Risk to Others Homicidal Ideation: No Thoughts of Harm to Others: No Current Homicidal Intent: No Current Homicidal Plan: No Access to Homicidal Means: No History of harm to others?: No Assessment of Violence: None Noted Violent Behavior Description: Being aggressive and confrontational with staff members Does patient have access to weapons?: No Criminal Charges Pending?: No Does patient have a court date: No  Psychosis Hallucinations: None noted Delusions: None noted  Mental Status Report Appear/Hygiene: Disheveled Eye Contact: Poor Motor Activity: Freedom of movement;Hyperactivity;Agitation;Restlessness;Unsteady Speech: Pressured;Loud;Abusive;Incoherent;Rapid;Argumentative;Aggressive Level of Consciousness: Combative;Irritable;Restless Mood: Irritable;Angry;Threatening Affect: Anxious;Angry;Irritable;Threatening Anxiety Level: Moderate Panic attack frequency: Daily Most recent panic attack: Today Thought Processes: Flight of Ideas;Tangential Judgement: Impaired Orientation: Person;Place;Time;Situation Obsessive Compulsive Thoughts/Behaviors: Moderate  Cognitive Functioning Concentration: Decreased Memory: Remote Intact;Recent  Intact IQ: Average Insight: Poor Impulse Control: Poor Appetite: Good Sleep: Decreased Total Hours of Sleep: 4   Prior Inpatient Therapy Prior Inpatient Therapy: Yes Prior Therapy Dates: 2012 Prior Therapy Facilty/Provider(s): multiple admits to [facility]  in 2012 & 2013 Reason for Treatment: Refused meds for last two weeks while incarcerated  Prior Outpatient Therapy Prior Outpatient Therapy: Yes Prior Therapy Dates: Current  Prior Therapy Facilty/Provider(s): [facility] Reason for Treatment: med management            Values / Beliefs Cultural Requests During Hospitalization: None Spiritual Requests During Hospitalization: None        Additional Information 1:1 In Past 12 Months?: No CIRT Risk: Yes Elopement Risk: Yes Does patient have medical clearance?: Yes     Disposition:  Disposition Disposition of Patient: Inpatient treatment program Type of inpatient treatment program: Adult Other disposition(s):  [facility]) Patient referred to: [facility]  On Site Evaluation by:   Reviewed with Physician:     Marlaine Hind ANN S 02/11/2012 11:47 AM

## 2012-02-11 NOTE — ED Notes (Signed)
Pt removing clothes. Refusing to stay in room.  Requiring constant redirection.

## 2012-02-11 NOTE — Discharge Planning (Signed)
Rebekah Pace with [facility] called CSW advising patient is on the priority list, however, there are "people" above her on the list. [facility] authorization was obtained from [facility] [id number]; 5/1 - 5/7.  [name] , MSW, LCSWA 02/11/2012 2:38 PM [phone]

## 2012-02-11 NOTE — Consult Note (Signed)
Reason for Consult: Bipolar mania and noncompliance with medication Referring Physician: Dr. Vivi Martens Rebekah Pace is an 56 y.o. female.  HPI: Patient was presented to [facility] long emergency department within 24 hours after the charts to home with her fianc/boyfriend. Reportedly they were unable to fill the prescription given to them and patient was unable to control herself without medication. Patient continued to be hypomanic to manic, irritable, agitated with this screaming and yelling very loud. Patient boyfriend stated he won't be able to help her in that situation and called the act team. Act team a was seen with the psychiatrist and recommended lithium which was refused by the patient. Reportedly [facility] was called who brought her to the emergency department for safety and stabilization. Upon arrival to the emergency department patient was disrobed herself with the uncontrollable disruptive behaviors like screaming yelling which required to be placed in 4. restraints and given Geodon and Ativan intramuscular doses. Patient has taken long time to calm herself down.  Patient was highly anxious worried and irritable and agitated at the same time. Heart mood was severe mood swings and affect was labile. Patient has limited insight, judgment and impulse control. Patient has denied psychotic symptoms like delusions, hallucinations or paranoia.  Past Medical History  Diagnosis Date  . Thyroid disease   . Anxiety   . Mental disorder   . Depression   . Hepatitis   . Hypothyroidism     Past Surgical History  Procedure Date  . Arm fracture   . Rotator cuff repair     No family history on file.  Social History:  reports that she has never smoked. She has never used smokeless tobacco. She reports that she drinks alcohol. She reports that she does not use illicit drugs.  Allergies:  Allergies  Allergen Reactions  . Sulfa Antibiotics Rash    Medications: I have reviewed the patient's current  medications.  Results for orders placed during the hospital encounter of 02/10/12 (from the past 48 hour(s))  URINE RAPID DRUG SCREEN (HOSP PERFORMED)     Status: Abnormal   Collection Time   02/10/12 11:53 AM      Component Value Range Comment   Opiates NONE DETECTED  NONE DETECTED     Cocaine NONE DETECTED  NONE DETECTED     Benzodiazepines POSITIVE (*) NONE DETECTED     Amphetamines NONE DETECTED  NONE DETECTED     Tetrahydrocannabinol NONE DETECTED  NONE DETECTED     Barbiturates NONE DETECTED  NONE DETECTED    CBC     Status: Abnormal   Collection Time   02/10/12 12:13 PM      Component Value Range Comment   WBC 5.8  4.0 - 10.5 (K/uL)    RBC 3.90  3.87 - 5.11 (MIL/uL)    Hemoglobin 11.3 (*) 12.0 - 15.0 (g/dL)    HCT 09.8 (*) 11.9 - 46.0 (%)    MCV 86.7  78.0 - 100.0 (fL)    MCH 29.0  26.0 - 34.0 (pg)    MCHC 33.4  30.0 - 36.0 (g/dL)    RDW 14.7  82.9 - 56.2 (%)    Platelets 223  150 - 400 (K/uL)   COMPREHENSIVE METABOLIC PANEL     Status: Abnormal   Collection Time   02/10/12 12:13 PM      Component Value Range Comment   Sodium 138  135 - 145 (mEq/L)    Potassium 4.4  3.5 - 5.1 (mEq/L)  Chloride 103  96 - 112 (mEq/L)    CO2 24  19 - 32 (mEq/L)    Glucose, Bld 100 (*) 70 - 99 (mg/dL)    BUN 19  6 - 23 (mg/dL)    Creatinine, Ser 1.30  0.50 - 1.10 (mg/dL)    Calcium 9.2  8.4 - 10.5 (mg/dL)    Total Protein 6.6  6.0 - 8.3 (g/dL)    Albumin 3.3 (*) 3.5 - 5.2 (g/dL)    AST 31  0 - 37 (U/L)    ALT 22  0 - 35 (U/L)    Alkaline Phosphatase 124 (*) 39 - 117 (U/L)    Total Bilirubin 0.2 (*) 0.3 - 1.2 (mg/dL)    GFR calc non Af Amer >90  >90 (mL/min)    GFR calc Af Amer >90  >90 (mL/min)   ETHANOL     Status: Normal   Collection Time   02/10/12 12:13 PM      Component Value Range Comment   Alcohol, Ethyl (B) <11  0 - 11 (mg/dL)     No results found.  No psychosis and Positive for aggressive behavior, anxiety, behavior problems, bipolar, mood swings and repetitive  activity Blood pressure 112/67, pulse 99, temperature 98 F (36.7 C), temperature source Oral, resp. rate 16, SpO2 100.00%.   Assessment/Plan: Bipolar disorder, with severe manic symptoms Noncompliance with treatment Failed to respond outpatient therapy and act team management  Patient needed acute psychiatric hospitalization for safety and stabilization. Patient was referred to [facility] crisis bed. Patient was not safe to be discharged at this time patient has lost her the support network because of severe manic behaviors.   She will be receiving medication Geodon 80 mg 2 times a day,  Geodon 20 mg intramuscular as needed 2 times a day, Trazodone 100 mg at bed time Vistaril 100 mg as needed for agitation and insomnia, Zyprexa 20 mg at bedtime and Synthroid 75 mcg daily  [name],JANARDHAHA R. 02/11/2012, 4:51 PM

## 2012-02-11 NOTE — ED Notes (Signed)
Pt states she is here because she did not get enough anxiety medicine when she was here yesterday.  Will not answer other questions just lays in bed and screams, demanding a coke and candy bar.

## 2012-02-11 NOTE — BH Assessment (Signed)
On the priority list for [facility].

## 2012-02-11 NOTE — ED Provider Notes (Signed)
History     CSN: [id number]  Arrival date & time 02/11/12  7829   First MD Initiated Contact with Patient 02/11/12 [phone]      Chief Complaint  Patient presents with  . Medical Clearance    (Consider location/radiation/quality/duration/timing/severity/associated sxs/prior treatment) HPI Comments: Rebekah Pace is a 56 y.o. Female brought in by police officers with a report that her boyfriend cannot control her at home. He is reportedly taking papers out on her. Patient is  unable to give a cogent history; she is agitated. She states that she is anxious. She denies taking her usual medicine. Her medicines were changed yesterday by the psychiatrist saw her here. She was to start Geodon 80 mg twice a day, and trazodone 100 mg at at bedtime. It is unknown if she took these medicines.   Level V Caveat- Uncooperative, Agitated  The history is provided by the patient and the police.    Past Medical History  Diagnosis Date  . Thyroid disease   . Anxiety   . Mental disorder   . Depression   . Hepatitis   . Hypothyroidism     Past Surgical History  Procedure Date  . Arm fracture   . Rotator cuff repair     No family history on file.  History  Substance Use Topics  . Smoking status: Never Smoker   . Smokeless tobacco: Never Used  . Alcohol Use: Yes    OB History    Grav Para Term Preterm Abortions TAB SAB Ect Mult Living                  Review of Systems  Unable to perform ROS   Allergies  Sulfa antibiotics  Home Medications   Current Outpatient Rx  Name Route Sig Dispense Refill  . CYANOCOBALAMIN 100 MCG PO TABS Oral Take 100 mcg by mouth daily.     Marland Kitchen LEVOTHYROXINE SODIUM 75 MCG PO TABS Oral Take 75 mcg by mouth daily.    Marland Kitchen OLANZAPINE 5 MG PO TABS Oral Take 20 mg by mouth at bedtime.    Marland Kitchen POLYETHYLENE GLYCOL 3350 PO PACK Oral Take 17 g by mouth daily.    . TRAZODONE HCL 100 MG PO TABS Oral Take 100 mg by mouth at bedtime as needed. For sleep    . ZIPRASIDONE  HCL 80 MG PO CAPS Oral Take 80 mg by mouth 2 (two) times daily with a meal.      BP 112/67  Pulse 99  Temp(Src) 98 F (36.7 C) (Oral)  Resp 16  SpO2 100%  Physical Exam  Nursing note and vitals reviewed. Constitutional: She appears well-developed and well-nourished.  HENT:  Head: Normocephalic and atraumatic.  Eyes: Conjunctivae and EOM are normal. Pupils are equal, round, and reactive to light.  Neck: Normal range of motion and phonation normal. Neck supple.  Cardiovascular: Normal rate.   Pulmonary/Chest: Effort normal. She exhibits no tenderness.  Musculoskeletal: Normal range of motion.  Neurological: She is alert. She has normal strength. She exhibits normal muscle tone.       Normal gait  Skin: Skin is warm and dry.  Psychiatric:       Agitated. Anxious.    ED Course  Procedures (including critical care time)  Treated with IM Geodon and Ativan, for agitation.  Consultation with in-house ACT: They communicated with the out patient ACT; who sees her daily, and helps with medications; they also report that their psychiatrist wanted her to to start  lithium. Note that our psychiatrists yesterday started her on Geodon 80 mg twice a day. Unfortunately, she did not get the evening dose last night because her boyfriend could not get it from the pharmacy. He is here with her now. He states that she lives in his home, and that he can no longer take care of her. She was released from jail one week ago, after being incarcerated for 2 weeks for trespassing. She continues to trespass, in the neighbors yards, while harrassing them with obscene gestures. The patient's son lives in the boyfriend's home too. He tries to avoid his mother and does not talk to her. I will have the in-house psychiatrist evaluate the patient today to consider the proper placement and treatments. The patient had medical clearance yesterday. She has had numerous ED visits, this month.  Case discussed with the in-house  psychiatrist, who will help to make disposition.  I signed involuntary commitment.  It is anticipated that she will be transferred to the [facility]  Labs Reviewed - No data to display No results found.   1. Psychosis       MDM  Recurrent agitation and psychosis with anxiety.    the patient cannot be maintained    in her current home setting. She will need placement in a psychiatric facility       Flint Melter, MD 02/11/12 1600

## 2012-02-11 NOTE — ED Notes (Signed)
Pt reports being here because she has "extreme anxiety." C/o back and knee pain. Sts "knock me out, no don't knock me out, I can't stand myself." [employer] sts her boyfriend called them due to not being able to control pt. IVC papers en route.

## 2012-02-11 NOTE — ED Notes (Signed)
Pt in triage, continues yelling, cursing at staff and [employer]. Had undressed completely out of scrubs. [employer] needed to redirect pt and redress. Report called to Psych ED, pt ambulated to dept with [employer] escort.

## 2012-02-11 NOTE — ED Notes (Signed)
Pt wanded and belongings searched by security. One belongings bag, pants, shirt, bra, wallet, bracelet, ring. Bag in Triage locker 4.

## 2012-02-11 NOTE — ED Notes (Signed)
Pt urinated on floor.  Continues to remove her clothes and scream.  Refusing to stay in her room.  Beating on walls and window in room. Threatening staff.  Placed in four point restraints, continues to scream and threaten staff.  Pt worked her way out of wrist restraints four times.  Restraints reapplied.

## 2012-02-11 NOTE — ED Notes (Signed)
Pt's boyfriend sts he is leaving. Has left contact information and would like to know plan of care. Sts pt cannot be discharged to his care because he cannot control her and sts there is no one else to take care of her and wants the psychologist to understand that. He sts she is refusing to take her meds and is screaming and leaving the house. Cannot care for her in that condition. Cell [id number], home [phone], work [phone].

## 2012-02-12 DIAGNOSIS — F3113 Bipolar disorder, current episode manic without psychotic features, severe: Secondary | ICD-10-CM

## 2012-02-12 MED ORDER — LORAZEPAM 1 MG PO TABS
2.0000 mg | ORAL_TABLET | ORAL | Status: DC | PRN
  Administered 2012-02-12 – 2012-02-13 (×4): 2 mg via ORAL
  Filled 2012-02-12: qty 1
  Filled 2012-02-12 (×3): qty 2
  Filled 2012-02-12: qty 1

## 2012-02-12 NOTE — ED Notes (Signed)
Pt redirected back to room with sitter at bedside.

## 2012-02-12 NOTE — ED Provider Notes (Signed)
8:00 AM Patient is asleep.  Per RN, and bedside sitter, she has been sleeping since yesterday, and there have been no new complaints, nor notable issues. Patient has been assessed and is awaiting dispo per [facility] team.  Gerhard Munch, MD 02/12/12 [phone]

## 2012-02-12 NOTE — Consult Note (Signed)
Reason for Consult:Bipolar mania Referring Physician: Dr. Tressie Ellis Rebekah Pace is an 56 y.o. female.  HPI: Patient was seen and chart reviewed. Patient continue to be manic with pressured speech and racing thoughts. She has been irritable and easily agitated. She has been taking her medications and no adverse effects noted.   Past Medical History  Diagnosis Date  . Thyroid disease   . Anxiety   . Mental disorder   . Depression   . Hepatitis   . Hypothyroidism     Past Surgical History  Procedure Date  . Arm fracture   . Rotator cuff repair     No family history on file.  Social History:  reports that she has never smoked. She has never used smokeless tobacco. She reports that she drinks alcohol. She reports that she does not use illicit drugs.  Allergies:  Allergies  Allergen Reactions  . Sulfa Antibiotics Rash    Medications: I have reviewed the patient's current medications.  No results found for this or any previous visit (from the past 48 hour(s)).  No results found.  Positive for aggressive behavior, anxiety, bad mood, behavior problems, bipolar and mood swings Blood pressure 115/80, pulse 112, temperature 99.2 F (37.3 C), temperature source Oral, resp. rate 22, SpO2 98.00%.   Assessment/Plan: Bipolar disorder, MRE is mania    [facility] Will continue medication management without changes    Rebekah Pace,JANARDHAHA R. 02/12/2012, 4:04 PM

## 2012-02-12 NOTE — ED Notes (Signed)
Pt at desk yelling at staff that she wants to go home and removing her clothes.  Redirected to room where she removed the rest of her clothes and got onto her stretcher.  Continues to yell with sitter at bedside.

## 2012-02-12 NOTE — Discharge Planning (Signed)
CSW confirmed with [name] at [facility] that patient is on priority waiting list.  No information needed at this time.  Manson Passey Austyn Seier ANN S , MSW, LCSWA 02/12/2012 10:12 AM [phone]

## 2012-02-12 NOTE — ED Notes (Signed)
Pt continues to have sitter at bedside. Continues to have episodes of chanting "I hate myself, I hate myself. I can't stand this." Continues with bizarre behavior and odd remarks. Repsonds very well to sitter at bedside, no yelling or screaming so far this shift, pt is able to answer questions, when she becomes irate, she asks sitter, "Are you staying here with me?" When sitter responds "Yes", pt calms down and returns to watching TV. Will continue to monitor, safe at present.

## 2012-02-13 NOTE — Discharge Planning (Signed)
Patient has been accepted to [facility] by Junious Dresser. Patient will be transported by [facility] due to IVC. Patient's nurse and EDP notified.  Manson Passey Jeannia Tatro ANN S , MSW, LCSWA 02/13/2012 11:27 AM [phone]

## 2012-02-13 NOTE — ED Provider Notes (Signed)
Pt seen and examined.  Sleeping this am.  Multiple prior visits associated with her psychiatric illness.  Filed Vitals:   02/13/12 0559  BP: 111/72  Pulse: 81  Temp: 97.6 F (36.4 C)  Resp: 20   Car RRR   Lungs CTA  A/p Pt is on wait list for [facility]  Celene Kras, MD 02/13/12 [phone]

## 2012-03-19 ENCOUNTER — Ambulatory Visit: Payer: Self-pay | Admitting: Gastroenterology

## 2012-06-14 IMAGING — CR DG CHEST 2V
2 series · 2 of 2 positions shown · non-contrast
Comparison: 12/02/2011

CLINICAL DATA: Short of breath.  Anxiety.

CHEST - 2 VIEW

[w chest lat]
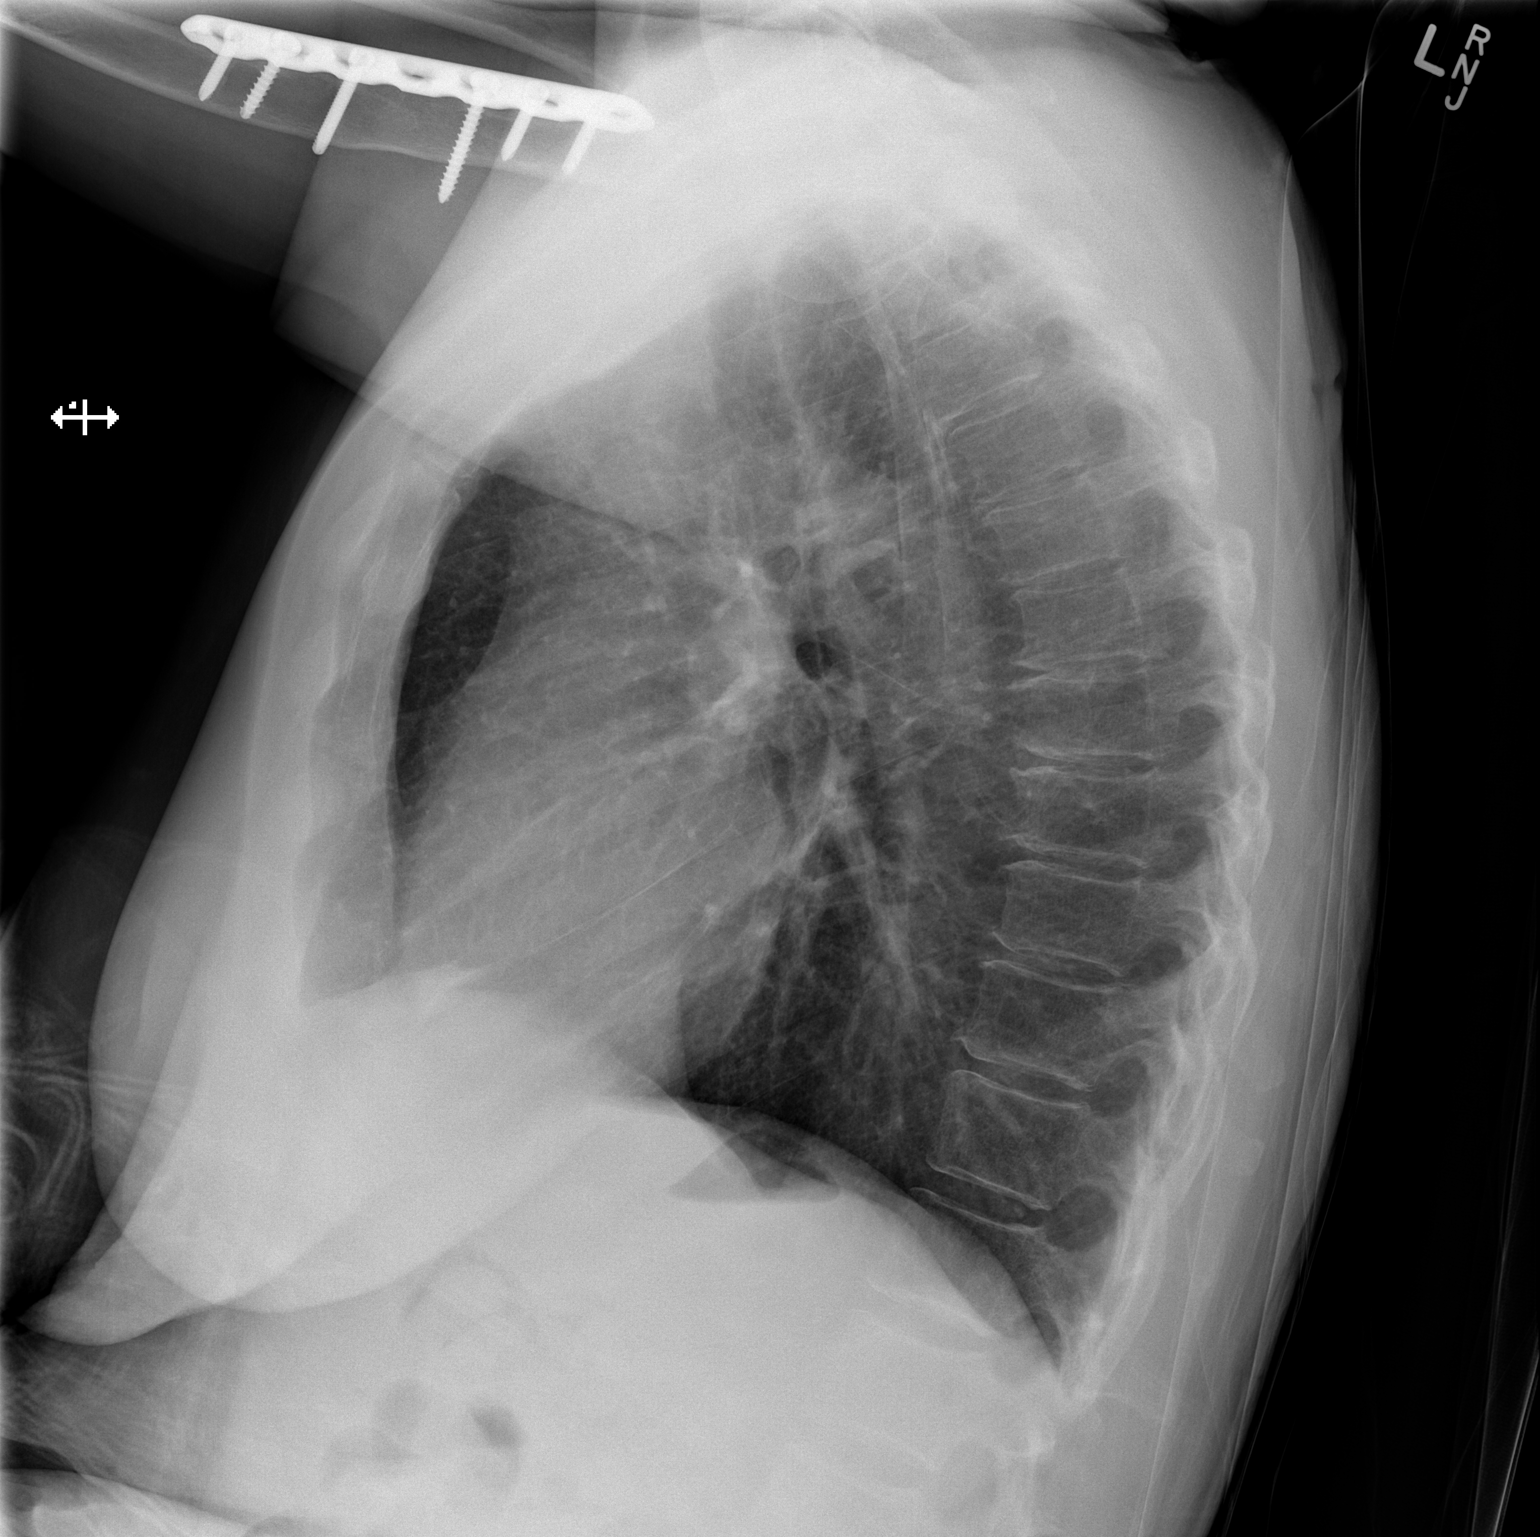

[x chest ap]
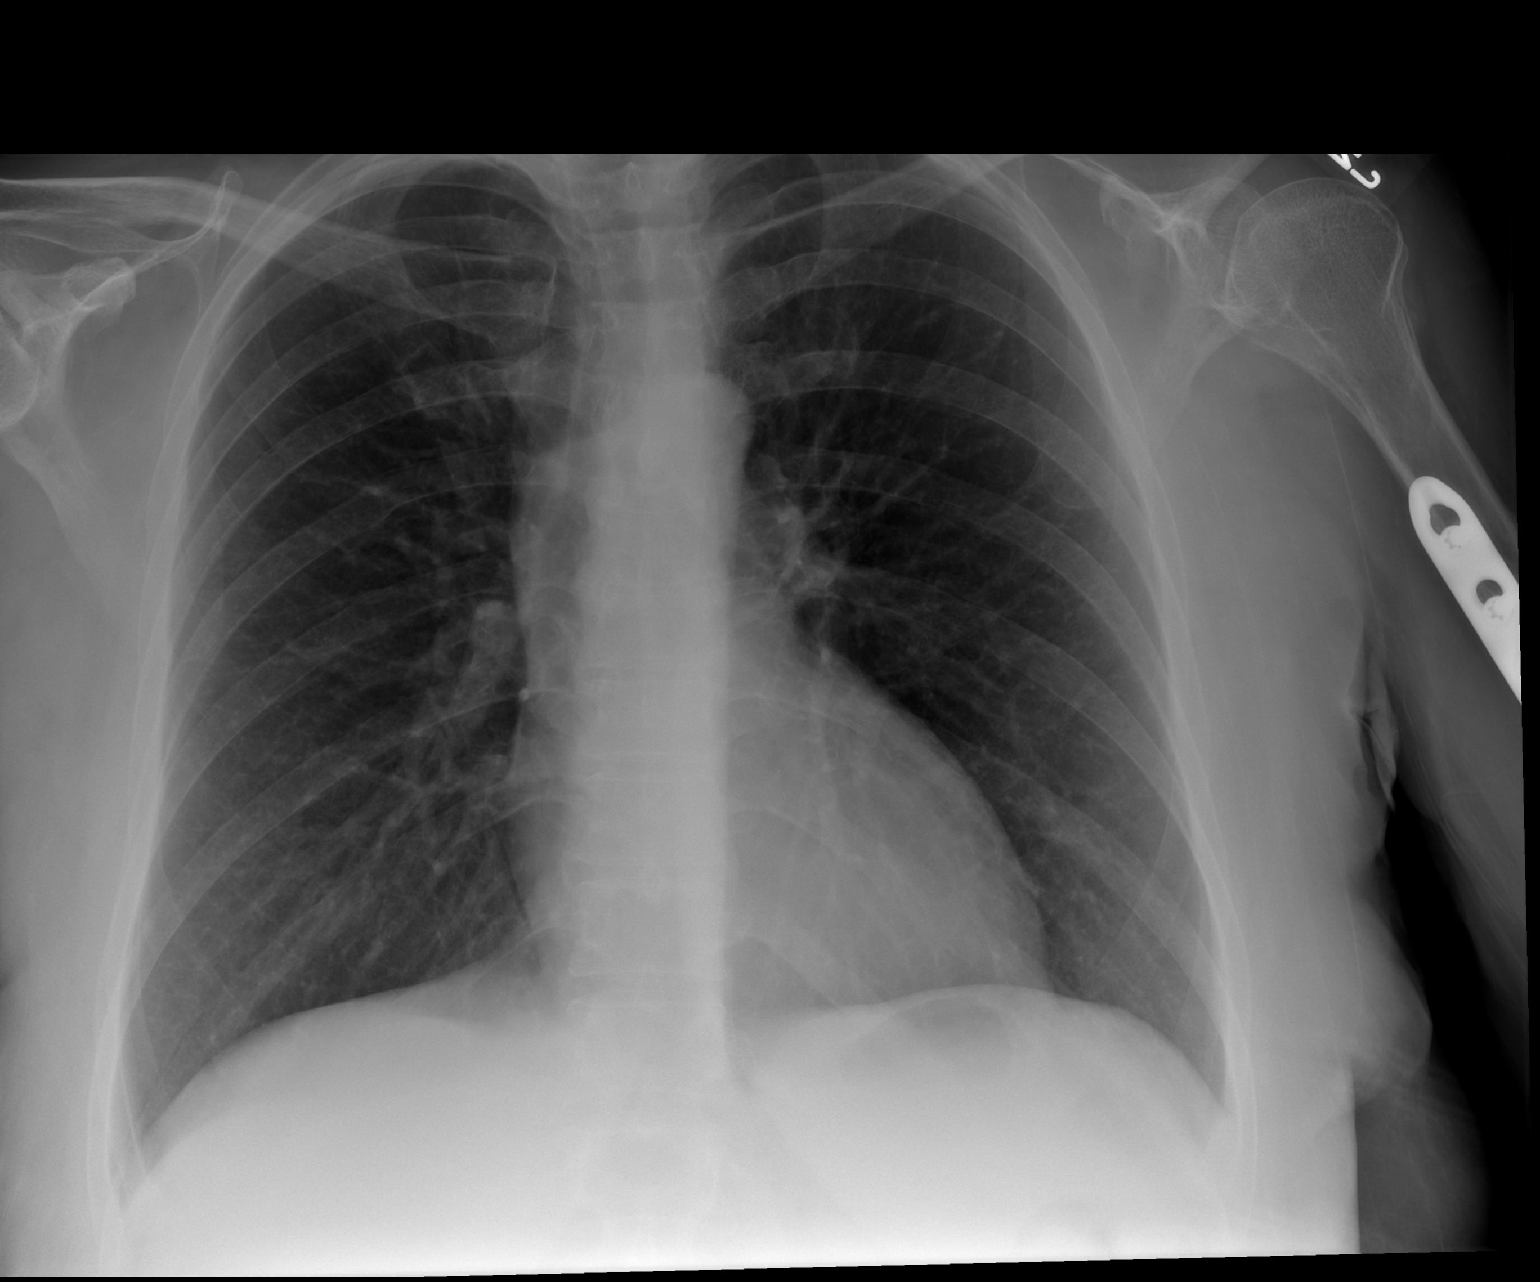

[2 of 2 positions shown; findings below may reference images not displayed]

FINDINGS: Heart size is normal.  Mediastinal shadows are normal.
Lungs are clear.  The vascularity is normal.  No effusions.  No
acute bony findings.
IMPRESSION: Normal chest

## 2012-07-02 ENCOUNTER — Encounter: Payer: Self-pay | Admitting: Gastroenterology

## 2013-09-15 ENCOUNTER — Other Ambulatory Visit: Payer: Self-pay

## 2013-11-05 ENCOUNTER — Encounter (HOSPITAL_COMMUNITY): Payer: Self-pay | Admitting: Emergency Medicine

## 2013-11-05 ENCOUNTER — Emergency Department (HOSPITAL_COMMUNITY)
Admission: EM | Admit: 2013-11-05 | Discharge: 2013-11-06 | Disposition: A | Discharge status: Home / Self Care | Payer: Medicaid Other | Attending: Emergency Medicine | Admitting: Emergency Medicine

## 2013-11-05 DIAGNOSIS — Z79899 Other long term (current) drug therapy: Secondary | ICD-10-CM | POA: Insufficient documentation

## 2013-11-05 DIAGNOSIS — E039 Hypothyroidism, unspecified: Secondary | ICD-10-CM | POA: Insufficient documentation

## 2013-11-05 DIAGNOSIS — F419 Anxiety disorder, unspecified: Secondary | ICD-10-CM

## 2013-11-05 DIAGNOSIS — F411 Generalized anxiety disorder: Secondary | ICD-10-CM | POA: Insufficient documentation

## 2013-11-05 DIAGNOSIS — F29 Unspecified psychosis not due to a substance or known physiological condition: Secondary | ICD-10-CM | POA: Insufficient documentation

## 2013-11-05 DIAGNOSIS — Z0389 Encounter for observation for other suspected diseases and conditions ruled out: Secondary | ICD-10-CM | POA: Insufficient documentation

## 2013-11-05 DIAGNOSIS — Z8719 Personal history of other diseases of the digestive system: Secondary | ICD-10-CM | POA: Insufficient documentation

## 2013-11-05 DIAGNOSIS — F329 Major depressive disorder, single episode, unspecified: Secondary | ICD-10-CM

## 2013-11-05 DIAGNOSIS — F3289 Other specified depressive episodes: Secondary | ICD-10-CM

## 2013-11-05 LAB — COMPREHENSIVE METABOLIC PANEL
ALK PHOS: 129 U/L — AB (ref 39–117)
ALT: 40 U/L — AB (ref 0–35)
AST: 47 U/L — AB (ref 0–37)
Albumin: 3.6 g/dL (ref 3.5–5.2)
BILIRUBIN TOTAL: 0.5 mg/dL (ref 0.3–1.2)
BUN: 11 mg/dL (ref 6–23)
CHLORIDE: 103 meq/L (ref 96–112)
CO2: 24 meq/L (ref 19–32)
Calcium: 9.2 mg/dL (ref 8.4–10.5)
Creatinine, Ser: 0.76 mg/dL (ref 0.50–1.10)
GFR calc Af Amer: >90 mL/min (ref >90)
GLUCOSE: 94 mg/dL (ref 70–99)
Potassium: 3.8 mEq/L (ref 3.7–5.3)
SODIUM: 138 meq/L (ref 137–147)
Total Protein: 7.3 g/dL (ref 6.0–8.3)

## 2013-11-05 LAB — RAPID URINE DRUG SCREEN, HOSP PERFORMED
Amphetamines: NOT DETECTED
BARBITURATES: NOT DETECTED
Benzodiazepines: NOT DETECTED
Cocaine: NOT DETECTED
Opiates: NOT DETECTED
Tetrahydrocannabinol: NOT DETECTED

## 2013-11-05 LAB — CBC
HCT: 38.9 % (ref 36.0–46.0)
HEMOGLOBIN: 13.1 g/dL (ref 12.0–15.0)
MCH: 29.6 pg (ref 26.0–34.0)
MCHC: 33.7 g/dL (ref 30.0–36.0)
MCV: 88 fL (ref 78.0–100.0)
PLATELETS: 224 10*3/uL (ref 150–400)
RBC: 4.42 MIL/uL (ref 3.87–5.11)
RDW: 13.4 % (ref 11.5–15.5)
WBC: 9.8 10*3/uL (ref 4.0–10.5)

## 2013-11-05 LAB — ACETAMINOPHEN LEVEL

## 2013-11-05 LAB — LITHIUM LEVEL
LITHIUM LVL: 0.86 meq/L (ref 0.80–1.40)
Lithium Lvl: 0.86 mEq/L (ref 0.80–1.40)

## 2013-11-05 LAB — SALICYLATE LEVEL

## 2013-11-05 LAB — ETHANOL

## 2013-11-05 MED ORDER — IBUPROFEN 200 MG PO TABS: 600.0000 mg | ORAL_TABLET | Freq: Three times a day (TID) | ORAL | Status: DC | PRN

## 2013-11-05 MED ORDER — ALUM & MAG HYDROXIDE-SIMETH 200-200-20 MG/5ML PO SUSP: 30.0000 mL | ORAL | Status: DC | PRN

## 2013-11-05 MED ORDER — ONDANSETRON HCL 4 MG PO TABS: 4.0000 mg | ORAL_TABLET | Freq: Three times a day (TID) | ORAL | Status: DC | PRN

## 2013-11-05 MED ORDER — LORAZEPAM 1 MG PO TABS: 1.0000 mg | ORAL_TABLET | Freq: Three times a day (TID) | ORAL | Status: DC | PRN

## 2013-11-05 MED ORDER — ZOLPIDEM TARTRATE 5 MG PO TABS: 5.0000 mg | ORAL_TABLET | Freq: Every evening | ORAL | Status: DC | PRN

## 2013-11-05 MED ORDER — NICOTINE 21 MG/24HR TD PT24
21.0000 mg | MEDICATED_PATCH | Freq: Every day | TRANSDERMAL | Status: DC
  Filled 2013-11-05: qty 1

## 2013-11-05 NOTE — Consult Note (Addendum)
[facility]iatry Consult   Reason for Consult: Referral for psychiatric medication evaluation Referring Physician:  EDP Yao/Albert PA-C Rebekah Pace is an 58 y.o. female.  Assessment: AXIS I:  Depressive Disorder NOS AXIS II:  Deferred AXIS III:   Past Medical History  Diagnosis Date  . Thyroid disease   . Anxiety   . Mental disorder   . Depression   . Hepatitis   . Hypothyroidism    AXIS IV:  other psychosocial or environmental problems AXIS V:  61-70 mild symptoms  Plan:  No evidence of imminent risk to self or others at present.   Patient does not meet criteria for psychiatric inpatient admission.  Subjective:   Rebekah Pace is a 58 y.o. female patient who presented to [facility] for evaluation of possible medication overdose. Patient states that she picked up her medications this afternoon around 12:15pm . States that she looked at her medications and she noticed that her meds had changed and that she was missing some doses. She was advised to come to the emergency department by Merry Proud, ACT team member, to be evaluated because the missing medication could not be accounted for and their was concern that she might have ingested extra doses of her Lithium. Patient states that she did not take any of the medication. Patient states that her boyfriend Rebekah Pace helps with her med administration. Patient denies SI, HI or AVH.    Past Psychiatric History: Past Medical History  Diagnosis Date  . Thyroid disease   . Anxiety   . Mental disorder   . Depression   . Hepatitis   . Hypothyroidism     reports that she has never smoked. She has never used smokeless tobacco. She reports that she drinks alcohol. She reports that she does not use illicit drugs. No family history on file.        Allergies:   Allergies  Allergen Reactions  . Sulfa Antibiotics Rash    ACT Assessment Complete:  No:   Past Psychiatric History: Yes Diagnosis:  Depressive Disorder  Hospitalizations: 2012  and 2013   Outpatient Care:  ACT  Substance Abuse Care:  None  Self-Mutilation:  None  Suicidal Attempts:  Unknown  Homicidal Behaviors:  None   Violent Behaviors:  None   Place of Residence:  Lives with her boyfriend Marital Status:  Single Employed/Unemployed:  Unemployed  Education:  High school graduate Family Supports:  Boyfriend  Objective: Blood pressure 136/73, pulse 72, temperature 99.3 F (37.4 C), temperature source Oral, resp. rate 18, SpO2 95.00%.There is no weight on file to calculate BMI. Results for orders placed during the hospital encounter of 11/05/13 (from the past 72 hour(s))  CBC     Status: None   Collection Time    11/05/13  7:08 PM      Result Value Range   WBC 9.8  4.0 - 10.5 K/uL   RBC 4.42  3.87 - 5.11 MIL/uL   Hemoglobin 13.1  12.0 - 15.0 g/dL   HCT 38.9  36.0 - 46.0 %   MCV 88.0  78.0 - 100.0 fL   MCH 29.6  26.0 - 34.0 pg   MCHC 33.7  30.0 - 36.0 g/dL   RDW 13.4  11.5 - 15.5 %   Platelets 224  150 - 400 K/uL  COMPREHENSIVE METABOLIC PANEL     Status: Abnormal   Collection Time    11/05/13  7:08 PM      Result Value Range  Sodium 138  137 - 147 mEq/L   Potassium 3.8  3.7 - 5.3 mEq/L   Chloride 103  96 - 112 mEq/L   CO2 24  19 - 32 mEq/L   Glucose, Bld 94  70 - 99 mg/dL   BUN 11  6 - 23 mg/dL   Creatinine, Ser 0.76  0.50 - 1.10 mg/dL   Calcium 9.2  8.4 - 10.5 mg/dL   Total Protein 7.3  6.0 - 8.3 g/dL   Albumin 3.6  3.5 - 5.2 g/dL   AST 47 (*) 0 - 37 U/L   ALT 40 (*) 0 - 35 U/L   Alkaline Phosphatase 129 (*) 39 - 117 U/L   Total Bilirubin 0.5  0.3 - 1.2 mg/dL   GFR calc non Af Amer >90  >90 mL/min   GFR calc Af Amer >90  >90 mL/min   Comment: (NOTE)     The eGFR has been calculated using the CKD EPI equation.     This calculation has not been validated in all clinical situations.     eGFR's persistently <90 mL/min signify possible Chronic Kidney     Disease.  SALICYLATE LEVEL     Status: Abnormal   Collection Time    11/05/13  7:08  PM      Result Value Range   Salicylate Lvl <3.5 (*) 2.8 - 20.0 mg/dL  ACETAMINOPHEN LEVEL     Status: None   Collection Time    11/05/13  7:08 PM      Result Value Range   Acetaminophen (Tylenol), Serum <15.0  10 - 30 ug/mL   Comment:            THERAPEUTIC CONCENTRATIONS VARY     SIGNIFICANTLY. A RANGE OF 10-30     ug/mL MAY BE AN EFFECTIVE     CONCENTRATION FOR MANY PATIENTS.     HOWEVER, SOME ARE BEST TREATED     AT CONCENTRATIONS OUTSIDE THIS     RANGE.     ACETAMINOPHEN CONCENTRATIONS     >150 ug/mL AT 4 HOURS AFTER     INGESTION AND >50 ug/mL AT 12     HOURS AFTER INGESTION ARE     OFTEN ASSOCIATED WITH TOXIC     REACTIONS.  ETHANOL     Status: None   Collection Time    11/05/13  7:08 PM      Result Value Range   Alcohol, Ethyl (B) <11  0 - 11 mg/dL   Comment:            LOWEST DETECTABLE LIMIT FOR     SERUM ALCOHOL IS 11 mg/dL     FOR MEDICAL PURPOSES ONLY  LITHIUM LEVEL     Status: None   Collection Time    11/05/13  7:33 PM      Result Value Range   Lithium Lvl 0.86  0.80 - 1.40 mEq/L  URINE RAPID DRUG SCREEN (HOSP PERFORMED)     Status: None   Collection Time    11/05/13  8:11 PM      Result Value Range   Opiates NONE DETECTED  NONE DETECTED   Cocaine NONE DETECTED  NONE DETECTED   Benzodiazepines NONE DETECTED  NONE DETECTED   Amphetamines NONE DETECTED  NONE DETECTED   Tetrahydrocannabinol NONE DETECTED  NONE DETECTED   Barbiturates NONE DETECTED  NONE DETECTED   Comment:            DRUG SCREEN FOR MEDICAL PURPOSES  ONLY.  IF CONFIRMATION IS NEEDED     FOR ANY PURPOSE, NOTIFY LAB     WITHIN 5 DAYS.                LOWEST DETECTABLE LIMITS     FOR URINE DRUG SCREEN     Drug Class       Cutoff (ng/mL)     Amphetamine      1000     Barbiturate      200     Benzodiazepine   517     Tricyclics       001     Opiates          300     Cocaine          300     THC              50   Labs are reviewed and are within normal limits. Lithium 0.86,  within therapeutic range.   Current Facility-Administered Medications  Medication Dose Route Frequency Provider Last Rate Last Dose  . alum & mag hydroxide-simeth (MAALOX/MYLANTA) 200-200-20 MG/5ML suspension 30 mL  30 mL Oral PRN Illene Labrador, PA-C      . ibuprofen (ADVIL,MOTRIN) tablet 600 mg  600 mg Oral Q8H PRN Illene Labrador, PA-C      . LORazepam (ATIVAN) tablet 1 mg  1 mg Oral Q8H PRN Illene Labrador, PA-C      . nicotine (NICODERM CQ - dosed in mg/24 hours) patch 21 mg  21 mg Transdermal Daily Robyn M Albert, PA-C      . ondansetron [facility]) tablet 4 mg  4 mg Oral Q8H PRN Illene Labrador, PA-C      . zolpidem (AMBIEN) tablet 5 mg  5 mg Oral QHS PRN Illene Labrador, PA-C       Current Outpatient Prescriptions  Medication Sig Dispense Refill  . clonazePAM (KLONOPIN) 1 MG tablet Take 1.5 mg by mouth 2 (two) times daily.      Marland Kitchen levothyroxine (SYNTHROID, LEVOTHROID) 75 MCG tablet Take 75 mcg by mouth daily.      Marland Kitchen lithium 300 MG tablet Take 600 mg by mouth at bedtime.      Marland Kitchen thiothixene (NAVANE) 5 MG capsule Take 5 mg by mouth 2 (two) times daily.      . trihexyphenidyl (ARTANE) 5 MG tablet Take 5 mg by mouth at bedtime.        Psychiatric Specialty Exam:     Blood pressure 136/73, pulse 72, temperature 99.3 F (37.4 C), temperature source Oral, resp. rate 18, SpO2 95.00%.There is no weight on file to calculate BMI.  General Appearance: Fairly Groomed  Engineer, water::  Good  Speech:  Clear and Coherent  Volume:  Normal  Mood:  Euthymic  Affect:  Congruent and Constricted  Thought Process:  Circumstantial  Orientation:  Full (Time, Place, and Person)  Thought Content:  WDL  Suicidal Thoughts:  No  Homicidal Thoughts:  No  Memory:  Immediate;   Fair Recent;   Fair Remote;   Fair  Judgement:  Fair  Insight:  Fair  Psychomotor Activity:  Tremor  Concentration:  Fair  Recall:  Fair  Akathisia:  No  Handed:  Right  AIMS (if indicated):     Assets:  Desire for  Improvement Housing  Sleep:      Treatment Plan Summary: Obtain a repeat Lithium level at 2300. If level in therapeutic range, patient can be  discharged home from ED when medically cleared.    2227 Updated R. Jarrett Soho concerning patient's assessment and not meeting criteria for admission. Jarrett Soho stated that patient's ACT team representative Merry Proud was concerned that patient had been more confused. Phone number for [place] with ACT obtained.  [place] with Merry Proud with ACT team at [phone] who states that patient did pick up her medications today and that she was given 6 doses of medications, 3 days worth of morning meds and 3 days worth of evening meds.  He states that the blister packs were opened by his nurse to add medication and the blisterpack was taped up. Merry Proud states that he received a call from the patient's boyfriend informing him there was only one morning dose and 3 evening doses present in the packet. Merry Proud states that patient has been more confused over the past 2 weeks and advised patient's boyfriend to bring patient to the ED for evaluation for possibly taking extra doses of medications. Merry Proud states that he "was not concerned about an intentional overdose. States that patient has been attending groups and has not expressed suicidal or homicidal thoughts. Merry Proud states that the patient's new medication blisterpack with her new med changes came in this afternoon. Merry Proud was informed that patient would have a repeat Lithium level drawn and would be discharged home if the level was therapeutic. Merry Proud states that he would call the patient's boyfriend and have him collect all the blisterpacks with meds at the patient's home and he would go to her home tomorrow to deliver her new medication and would take all the old medication out of the home.   2312 Updated R. Jarrett Soho of the plan for new medication to be delivered to patient tomorrow and patient could be discharged if Lithium level in  therapeutic range.   0037 Repeat Lithium level reviewed. Level unchanged from initial level of 0.86. Spoke with Merry Proud, ACT team at [phone] and informed of the result. States that he will contact patient and her boyfriend later this morning and will deliver her new meds today (Saturday 11/06/13).  Serena Colonel, FNP-BC 11/05/2013 11:45 PM   Reviewed note. Agree with assessment and plan. Revoewed labs.   If patient is medically stable, and without and suicidal or homicidal ideation, intent or plans, agree with no further psychiatric management.  Coralyn Helling, M.D.  11/06/2013 9:00 AM

## 2013-11-05 NOTE — ED Notes (Signed)
Pt states unable to urinate at this time. 

## 2013-11-05 NOTE — ED Provider Notes (Signed)
CSN: [id number]     Arrival date & time 11/05/13  1800 History   First MD Initiated Contact with Patient 11/05/13 1834     Chief Complaint  Patient presents with  . Drug Overdose   (Consider location/radiation/quality/duration/timing/severity/associated sxs/prior Treatment) HPI Comments: Patient is a 58 year old female with a past medical history of mental disorder, depression and anxiety who presents to the emergency department with her significant other with concerns of possible medication overdose. Patient is a poor historian. According to patient, she picked up a medication box from the pharmacy and reported that or 2 doses of medicine missing when she arrived home. She then contacted the ACT team member Merry Proud who told her that she should go to the emergency department in case she took these medications. [name], patient's significant other states he swears that patient did not take an extra dose of medication. I personally contacted Merry Proud, the ACT team member who is concerned of patient's recent increasing confusion. Patient's medication was changed around earlier today, she was sent home with a new pill pack with 3 morning doses and 3 evening doses, currently there is only one morning dose and 3 evening dose is present in the packet. He is concerned that she could potentially have lithium toxicity. Merry Proud states that patient tends to miss medication doses, significant other is supposed to monitor how she takes them and administer them to her, however sometimes there are doses found in different areas of the home that she has not taken. Patient currently denying suicidal or homicidal ideations. Denying any symptoms, denies abdominal pain, nausea, vomiting, chest pain, shortness of breath, headache or confusion.  Patient is a 58 y.o. female presenting with Overdose. The history is provided by the patient, a significant other and a caregiver.  Drug Overdose    Past Medical History  Diagnosis Date  .  Thyroid disease   . Anxiety   . Mental disorder   . Depression   . Hepatitis   . Hypothyroidism    Past Surgical History  Procedure Laterality Date  . Arm fracture    . Rotator cuff repair     No family history on file. History  Substance Use Topics  . Smoking status: Never Smoker   . Smokeless tobacco: Never Used  . Alcohol Use: Yes   OB History   Grav Para Term Preterm Abortions TAB SAB Ect Mult Living                 Review of Systems  Psychiatric/Behavioral:       Positive for possible drug overdose.  All other systems reviewed and are negative.    Allergies  Sulfa antibiotics  Home Medications   Current Outpatient Rx  Name  Route  Sig  Dispense  Refill  . clonazePAM (KLONOPIN) 1 MG tablet   Oral   Take 1.5 mg by mouth 2 (two) times daily.         Marland Kitchen levothyroxine (SYNTHROID, LEVOTHROID) 75 MCG tablet   Oral   Take 75 mcg by mouth daily.         Marland Kitchen lithium 300 MG tablet   Oral   Take 600 mg by mouth at bedtime.         Marland Kitchen thiothixene (NAVANE) 5 MG capsule   Oral   Take 5 mg by mouth 2 (two) times daily.         . trihexyphenidyl (ARTANE) 5 MG tablet   Oral   Take 5 mg by mouth at  bedtime.          BP 136/73  Pulse 72  Temp(Src) 99.3 F (37.4 C) (Oral)  Resp 18  SpO2 95% Physical Exam  Nursing note and vitals reviewed. Constitutional: She is oriented to person, place, and time. She appears well-developed and well-nourished. No distress.  HENT:  Head: Normocephalic and atraumatic.  Mouth/Throat: Oropharynx is clear and moist.  Eyes: Conjunctivae and EOM are normal. Pupils are equal, round, and reactive to light.  Neck: Normal range of motion. Neck supple.  Cardiovascular: Normal rate, regular rhythm and normal heart sounds.   Pulmonary/Chest: Effort normal and breath sounds normal.  Abdominal: Soft. Bowel sounds are normal. There is no tenderness.  Musculoskeletal: Normal range of motion. She exhibits no edema.  Neurological: She  is alert and oriented to person, place, and time.  Skin: Skin is warm and dry. She is not diaphoretic.  Psychiatric: Her behavior is normal. Her mood appears anxious. She expresses no homicidal and no suicidal ideation.  Fidgety    ED Course  Procedures (including critical care time) Labs Review Labs Reviewed  COMPREHENSIVE METABOLIC PANEL - Abnormal; Notable for the following:    AST 47 (*)    ALT 40 (*)    Alkaline Phosphatase 129 (*)    All other components within normal limits  SALICYLATE LEVEL - Abnormal; Notable for the following:    Salicylate Lvl <9.6 (*)    All other components within normal limits  LITHIUM LEVEL  CBC  URINE RAPID DRUG SCREEN (HOSP PERFORMED)  ACETAMINOPHEN LEVEL  ETHANOL  LITHIUM LEVEL   Imaging Review No results found.  EKG Interpretation   None       MDM   1. Anxiety   2. Medication management     Pt presenting with possible overdose on daily medications. Pt is a poor historian, anxious, fidgety. NAD. VSS. Asymptomatic. I spoke with Merry Proud, member of ACT team who advised pt be seen by psych, she is not suicidal or homicidal, just increased confusion and not taking meds correctly. Concern for lithium toxicity. Initial lithium draw WNL. Pt evaluated by Katina Degree NP who also spoke with Merry Proud, she does not meet inpatient criteria. Plan is to recheck lithium level at 11:30, if normal, she will be discharged home. Merry Proud is contacting significant other [name], who will collect all pill packs from home, and pt's new pill packs will be delivered personally to pt's house by [facility] tomorrow. 11:55 PM Repeat lithium level unchanged. She is stable for discharge home. Return precautions given. Patient states understanding of treatment care plan and is agreeable.     Illene Labrador, PA-C 11/05/13 2355

## 2013-11-05 NOTE — Discharge Instructions (Signed)
Merry Proud from ACT team will be bringing your medications to your home tomorrow.  Basics of Medication Management UNDERSTAND YOUR MEDICATIONS  Read all of the labels and inserts that come with your medications. Review the information on this form often.  Know what potential side effects to look for (for each medication).  Know what each of your medications look like (by color, shape, size, stamp). If you are getting confused and having a hard time telling them apart, talk with your caregiver or pharmacist. They may be able to change the medication or help you to identify them more easily.  Check with your pharmacist if you notice a difference in the size or color of your medication.  Get all of your medications at one pharmacy. The pharmacist will have all of your information and understand possible drug interactions.  Ask your caregiver questions about your prescriptions and any over-the-counter medications, vitamins, herbal or dietary supplements that you take. TAKE YOUR MEDICATION SAFELY  Take medications only as prescribed.  Talk with your caregiver or pharmacist if some of your pills look the same and it is difficult to tell them apart. They can help you to recognize different medications.  Never double up on your medication.  Never take anyone else's medication or share your medications.  Do not stop taking your medication(s) unless you have discussed it with your caregiver.  Do not split, mash, or chew medications unless your caregiver tells you to do so. Tell your caregiver if you have trouble swallowing your medication(s).  For liquid medications, make sure you use the dosing container provided to you.  You may need to avoid alcohol or certain foods or liquids with one or more of your medications. Make sure you remember how to take each medication with some of the tools below. ORGANIZE YOUR MEDICATIONS  Use a tool, such as a weekly pill box (available at your local pharmacy),  written chart from your caregiver, notebook, binder, or your own calendar to organize your daily medications. Please note: if you are having trouble telling your different medications apart, keep them in the original bottles.  Set cues or reminders for taking your medications. Use watch alarms, mobile device/phone calendar alarms, or sticky notes.  More advanced medication management systems are also available. These offer weekly or monthly options complete with storage, alarms, and visual and audio prompts.  Your system should help you to keep track of the:  Name of medication and dose.  Day.  Time.  Pill to take (by color, shape, size, or name/imprint).  How to take it (with or without certain foods, on an empty stomach, with fluids etc.).  Review your medication schedule with a family member or friend to help you. Other household members should understand your medications.  If you are taking medications on an "as needed" basis such as those for nausea or pain, write down the name, dose, and time you took the medication so that you remember what you have taken. PLAN AHEAD FOR REFILLS AND TRAVEL  Take your pill box, medications, and calendar system with you when you travel.  Plan ahead for refills as to not run out of your medication(s).  Always carry an updated list of your medications with you. If there is an emergency, a respondent can quickly see what medications you are taking. STORE AND DISCARD YOUR MEDICATIONS SAFELY  Store medications in a cool, dry area away from light. (The bathroom is not a good place for storage because of heat and  humidity.)  Store your medications away from chemicals, pet medications, or other family member's medications.  Keep medications out of children's reach, away from counters and bedside tables. Store them up high in cabinets or shelves.  Check expiration dates regularly.  Learn about the best way to dispose of each medication you take.  Find out if your local government recycling program has a Medicine Take Back program for safe disposal. If not, some medications may be mixed with inedible substances and thrown away in the trash. Certain medications are to be flushed down the toilet. REMEMBER:  Tell your caregiver if you experience side effects, new symptoms, or have other concerns. There may be dosing changes or alternative medications that would be better for you.  Review your medications regularly with your caregiver. Check to see if you need to continue to take each medication, and discuss how well they are working. Medicines, diet, medical conditions, weight changes, and other habits can all affect how medicines work. PEDIATRIC CONSIDERATIONS If you are taking care of an infant or child who needs multiple medications, follow the tips above to organize a medication schedule and safely give and store medications.   Use positive reinforcement (singing, cuddling, reward) for your child to help him or her take necessary medications.  Use only syringes, droppers, dosing spoons, or dosing cups provided by your caregiver or pharmacist.  Always wash your hands before giving medications.  Get to know your child's school medication policies. Meet with the school nurse to review the medication schedule in detail. Never send the medication to school with your child.  Check with your child's caregiver if he or she has trouble taking medication, forgets a dose, or spits it up.  Make sure your child knows how to use an inhaler properly if needed.  Do not give your child over-the-counter cough and cold medicines if they are under 38 years of age.  Avoid giving your child or teenager Aspirin or Aspirin-containing products. Document Released: 01/15/2011 Document Revised: 12/23/2011 Document Reviewed: 01/15/2011 [facility] Patient Information 2014 [place].

## 2013-11-05 NOTE — BH Assessment (Signed)
Received call for tele-assessment. Spoke to Illene Labrador, PA-C who said Pt's ACTT provider Merry Proud is concerned about Pt's medications and wants Pt seen by psychiatry. Will notify Serena Colonel, NP of request.  Orpah Greek Anson Fret, [facility], [facility] Triage Specialist

## 2013-11-05 NOTE — ED Notes (Signed)
Pt picked up new medication box from pharmacy today. Pt reports noticing 2 extra doses of medicine were missing from box when she arrived home. Pt contacted ACT team member, Merry Proud (phone number [phone]). Pt's significant other was told pt must have taken them and that he should take her to the hospital. Pt denies taking extra doses.

## 2013-11-06 NOTE — ED Provider Notes (Signed)
Medical screening examination/treatment/procedure(s) were performed by non-physician practitioner and as supervising physician I was immediately available for consultation/collaboration.  EKG Interpretation   None         Wandra Arthurs, MD 11/06/13 1455

## 2013-11-14 DIAGNOSIS — R0989 Other specified symptoms and signs involving the circulatory and respiratory systems: Secondary | ICD-10-CM | POA: Insufficient documentation

## 2013-11-14 DIAGNOSIS — F325 Major depressive disorder, single episode, in full remission: Secondary | ICD-10-CM | POA: Insufficient documentation

## 2013-11-18 ENCOUNTER — Ambulatory Visit (INDEPENDENT_AMBULATORY_CARE_PROVIDER_SITE_OTHER): Payer: Medicare Other | Admitting: Internal Medicine

## 2013-11-18 ENCOUNTER — Encounter: Payer: Self-pay | Admitting: Internal Medicine

## 2013-11-18 VITALS — BP 110/76 | HR 72 | Temp 98.1°F | Resp 16 | Wt 191.6 lb

## 2013-11-18 DIAGNOSIS — E039 Hypothyroidism, unspecified: Secondary | ICD-10-CM

## 2013-11-18 NOTE — Patient Instructions (Signed)

## 2013-11-18 NOTE — Progress Notes (Signed)
Patient ID: Rebekah Pace, female   DOB: 1955/10/19, 58 y.o.   MRN: [id number]   This very nice 58 y.o. female presents for follow up with Labile Hypertension, Hypothyroidism, Migraine, IBS, PreDiabetes,  and Vitamin D Deficiency. Inspring of 2013 she had a psychotic break and presented with classic Sn' & Sx' of Schizophrenia and had several Psych admissions and she was also Dx'd Bipolar Manic Depressive . She continues to be followed by several out patient resources to supervise and dispense he psychotropic medications. She is not felt to be able to function independently in society. She does have a BF who apparently monitors her closely and allows her 9 yo son to reside with him for a stable environment.   She is followed in this office primarily to momnitor her thyroid status which is compriomised by limited financial resources while she awaits SS disability decisions. WRT her thyroid status, other than weight gain which if felt related to her Psyche meds, she is felt adequately compensated.    Medication List       This list is accurate as of: 11/18/13  7:33 PM.  Always use your most recent med list.               clonazePAM 1 MG tablet  Commonly known as:  KLONOPIN  Take 1.5 mg by mouth 2 (two) times daily.     levothyroxine 75 MCG tablet  Commonly known as:  SYNTHROID, LEVOTHROID  Take 75 mcg by mouth daily.     lithium 300 MG tablet  Take 600 mg by mouth at bedtime.     thiothixene 5 MG capsule  Commonly known as:  NAVANE  Take 5 mg by mouth 2 (two) times daily.     trihexyphenidyl 5 MG tablet  Commonly known as:  ARTANE  Take 5 mg by mouth at bedtime.         Allergies  Allergen Reactions  . Sulfa Antibiotics Rash    PMHx:   Past Medical History  Diagnosis Date  . Mental disorder   . Hepatitis   . Anxiety   . Depression   . Thyroid disease   . Hypothyroidism   . Prediabetes   . Labile hypertension     FHx:    Reviewed / unchanged  SHx:    Reviewed /  unchanged  Systems Review: Constitutional: Denies fever, chills, wt changes, headaches, insomnia, fatigue, night sweats, change in appetite. Eyes: Denies redness, blurred vision, diplopia, discharge, itchy, watery eyes.  ENT: Denies discharge, congestion, post nasal drip, epistaxis, sore throat, earache, hearing loss, dental pain, tinnitus, vertigo, sinus pain, snoring.  CV: Denies chest pain, palpitations, irregular heartbeat, syncope, dyspnea, diaphoresis, orthopnea, PND, claudication, edema. Respiratory: denies cough, dyspnea, DOE, pleurisy, hoarseness, laryngitis, wheezing.  Gastrointestinal: Denies dysphagia, odynophagia, heartburn, reflux, water brash, abdominal pain or cramps, nausea, vomiting, bloating, diarrhea, constipation, hematemesis, melena, hematochezia,  or hemorrhoids. Genitourinary: Denies dysuria, frequency, urgency, nocturia, hesitancy, discharge, hematuria, flank pain. Musculoskeletal: Denies arthralgias, myalgias, stiffness, jt. swelling, pain, limp, strain/sprain.  Skin: Denies pruritus, rash, hives, warts, acne, eczema, change in skin lesion(s). Neuro: No weakness, tremor, incoordination, spasms, paresthesia, or pain. Psychiatric: No acute delusions or hallucinations at present. Endo: Denies change in weight, skin, hair change.  Heme/Lymph: No excessive bleeding, bruising, orenlarged lymph nodes.  Filed Vitals:   11/18/13 1450  BP: 110/76  Pulse: 72  Temp: 98.1 F (36.7 C)  Resp: 16    Estimated body mass index is 34.46 kg/(m^2) as calculated  from the following:   Height as of 12/16/11: 5' 2.5" (1.588 m).   Weight as of this encounter: 191 lb 9.6 oz (86.909 kg).  On Exam: Appears well nourished - in no distress. Eyes: PERRLA, EOMs, conjunctiva no swelling or erythema. Sinuses: No frontal/maxillary tenderness ENT/Mouth: EAC's clear, TM's nl w/o erythema, bulging. Nares clear w/o erythema, swelling, exudates. Oropharynx clear without erythema or exudates. Oral  hygiene is good. Tongue normal, non obstructing. Hearing intact.  Neck: Supple. Thyroid nl. Car 2+/2+ without bruits, nodes or JVD. Chest: Respirations nl with BS clear & equal w/o rales, rhonchi, wheezing or stridor.  Cor: Heart sounds normal w/ regular rate and rhythm without sig. murmurs, gallops, clicks, or rubs. Peripheral pulses normal and equal  without edema.  Musculoskeletal: Full ROM all peripheral extremities, joint stability, 5/5 strength, and normal gait.  Skin: Warm, dry without exposed rashes, lesions, ecchymosis apparent.  Neuro: Cranial nerves intact, reflexes equal bilaterally. Sensory-motor testing grossly intact. Tendon reflexes grossly intact.  Pysch: Alert & oriented x 3. Insight and judgement nl & appropriate. No ideations.  Assessment and Plan:  1. Hypothyroidism, compensated - will defer lab testing today as she is stable   Recommended regular exercise, BP monitoring, weight control, and discussed med and SE's.

## 2014-01-03 ENCOUNTER — Ambulatory Visit (INDEPENDENT_AMBULATORY_CARE_PROVIDER_SITE_OTHER): Payer: Medicare Other | Admitting: Internal Medicine

## 2014-01-03 ENCOUNTER — Encounter: Payer: Self-pay | Admitting: Internal Medicine

## 2014-01-03 VITALS — BP 114/76 | HR 72 | Temp 98.1°F | Resp 16 | Ht 64.0 in | Wt 189.2 lb

## 2014-01-03 DIAGNOSIS — F3162 Bipolar disorder, current episode mixed, moderate: Secondary | ICD-10-CM

## 2014-01-03 DIAGNOSIS — E039 Hypothyroidism, unspecified: Secondary | ICD-10-CM

## 2014-01-03 NOTE — Progress Notes (Signed)
   Subjective:    Patient ID: Rebekah Pace, female    DOB: 04-18-1956, 58 y.o.   MRN: [id number]  HPI Patient has compensated Hypothyroidism and Bipolar Manic Depressive Disorder with schizophrenia and who is taking Navane, Artane and clonazepam presents with c/o feeling "disjointed" and clumsy and moving in slow motion. Apparently in the last week she has fallen 2 x w/o LOC or significant head injury other than a scrape on one occasion to her Lt scalp area and knee. She denies any focal neurologic signs or sx's. She feels the falls occurred because she tripped.  Medication Sig  . clonazePAM (KLONOPIN) 1 MG tablet Take 1.5 mg by mouth 2 (two) times daily.  Marland Kitchen levothyroxine (SYNTHROID, LEVOTHROID) 75 MCG tablet Take 75 mcg by mouth daily.  Marland Kitchen lithium 300 MG tablet Take 600 mg by mouth at bedtime.  Marland Kitchen thiothixene (NAVANE) 5 MG capsule Take 5 mg by mouth 2 (two) times daily.  . trihexyphenidyl (ARTANE) 5 MG tablet Take 5 mg by mouth at bedtime.   Allergies  Allergen Reactions  . Sulfa Antibiotics Rash   Past Medical History  Diagnosis Date  . Mental disorder   . Hepatitis   . Anxiety   . Depression   . Thyroid disease   . Hypothyroidism   . Prediabetes   . Labile hypertension    Review of Systems Neg to above and specifically no vertigo.     Objective:   Physical Exam                  BP 114/76  Pulse 72  Temp 98.1 F   Resp 16  Ht 5\' 4"    Wt 189 lb 3.2 oz   BMI 32.46 kg/m2  HEENT - Eac's patent. TM's Nl.EOM's full. PERRLA. NasoOroPharynx clear. Neck - supple. Nl Thyroid. No bruits nodes JVD Chest - Clear equal BS Cor - Nl HS. RRR w/o sig MGR. PP 1(+) No edema. Abd - No palpable organomegaly, masses or tenderness. BS nl. MS- FROM. w/o deformities. Muscle power tone and bulk Nl. Gait Nl. Neuro - No obvious Cr N abnormalities. Sensory, motor and Cerebellar functions appear Nl w/o focal abnormalities. Gait & station wnl. Nl F->N. Neg Romberg and tandem walk. Slight fine high  frequency low amplitude action tremor of the hands. Psych - A & O x 3 - seems appropriate w/o ideations, delusions or hallucinations.  Assessment & Plan:  1. Unspecified hypothyroidism - TSH  2. Bipolar 1 disorder, mixed, moderate - Lithium level to r/o toxicity  Recommend she taper her Clonazepam from 1.5 tab to 1 tab or even 0.5 tab until she can contact Dr Baird Cancer - her psychiatrist.

## 2014-01-03 NOTE — Patient Instructions (Signed)
suggest you cut back on you clonazepam from 1 & 1/2 tabs twice daily   Down to   Only 1 tablet twice daily   Or even down to just 1/2 tablet twice daily is you're feeling clumsy and over-sedated =====================================================================================  possible  Lithium Toxicity Lithium is a medicine used to treat depression and bipolar disorders. Lithium poisoning (toxicity) occurs when the amount of lithium in the blood is too high. The greater the amount in the blood, the more problems occur. This is common among those taking lithium. This is because the amount of lithium needed to help the disease is not much different than the amount that can cause poisoning. Hence, it is very important to detect signs of toxicity early.  CAUSES  Lithium toxicity can occur for several reasons:  Intentional overdose.  Accidental overdose.  If taking many medicines, confusion can lead to taking too much or not enough of one or more prescriptions.  Even if taking lithium correctly, other medicines can change levels in the blood. Many drugs (such as medicines used for arthritis, some antibiotics, water pills (diuretics), dilantin (used for control of epilepsy), and cyclosporine (used for patients receiving transplants) can increase lithium levels.  If unrelated diseases develop, the amount of lithium in the blood might go up or down.  While working with your caregiver to fine tune your dosing, it is possible that a prescribed dose might be too much. SYMPTOMS  The symptoms of mild-to-moderate lithium toxicity are:   Diarrhea.  Vomiting.  Drowsiness.  Thirst.  Muscle weakness.  Mild shakiness, particularly of the hands.  Frequent urination. The symptoms of severe toxicity are:  Blurred vision.  Giddiness.  Ringing in the ears (Tinnitus).  Seizures.  Increased shakiness.  Severe muscle spasms.  Loss of consciousness or coma. Some people will get  symptoms of mild toxicity even if the level gets close to the top of the normal range. Even if your lab test shows a blood level that is "normal", it is important to tell your caregiver if you are having any of the symptoms noted above. DIAGNOSIS  Your caregiver will look at your signs and symptoms. You will be asked about the dose of lithium you are taking and when the last dose was taken. You will also be asked about how long you have been on the medicine. Then, your caregiver will order blood tests to check your blood lithium level.  TREATMENT  Treatment is mainly supportive.   Mild symptoms can be controlled by reducing or stopping the drug. This can be restarted 24-48 hours later at a lower dose. Your caregiver may recommend changing the daily dose of lithium.  Severe cases are treated by removing lithium from the body. Care is started in a hospital emergency department.  A tube may be placed through the nose or mouth into the stomach. The tube can be used to remove lithium that has not been digested yet. It can also be used to put medicines directly into the stomach to help stop lithium absorption.  Medicines may be used that cause diarrhea. This can help stop absorption of lithium. Your caregiver may suggest cleaning of blood using an artificial kidney (dialysis). This is usually only used for the most severe cases. HOME CARE INSTRUCTIONS   Take the dose of lithium as advised by your caregiver.  Never take more than your advised dose.  Drink plenty of water as dehydration increases the risk of lithium toxicity.  Seek the advice of your  caregiver before starting a low-salt diet as reducing your salt intake can lead to toxicity.  Check your lithium levels in the blood regularly. Tests are generally done 12-18 hours after the last dose. SEEK MEDICAL CARE IF:   You notice any symptoms of lithium toxicity as described above.  You are confused or have questions about how to take your  medicines. SEEK IMMEDIATE MEDICAL CARE IF:   Your symptoms are severe.  You discover that you have accidentally taken too much lithium or too much of any other medicine. MAKE SURE YOU:   Understand these instructions.  Will watch your condition.  Will get help right away if you are not doing well or get worse. Document Released: 07/28/2007 Document Revised: 12/23/2011 Document Reviewed: 07/28/2007 [facility] Patient Information 2014 [place].

## 2014-01-04 LAB — LITHIUM LEVEL: Lithium Lvl: 1 mEq/L (ref 0.80–1.40)

## 2014-01-04 LAB — TSH: TSH: 0.155 u[IU]/mL — ABNORMAL LOW (ref 0.350–4.500)

## 2014-02-08 ENCOUNTER — Other Ambulatory Visit: Payer: PPO

## 2014-02-08 ENCOUNTER — Other Ambulatory Visit: Payer: Self-pay | Admitting: *Deleted

## 2014-02-08 DIAGNOSIS — E039 Hypothyroidism, unspecified: Secondary | ICD-10-CM

## 2014-02-08 MED ORDER — LEVOTHYROXINE SODIUM 75 MCG PO TABS: 75.0000 ug | ORAL_TABLET | Freq: Every day | ORAL | Status: DC

## 2014-02-09 LAB — TSH: TSH: 0.416 u[IU]/mL (ref 0.350–4.500)

## 2014-02-10 ENCOUNTER — Other Ambulatory Visit: Payer: Self-pay

## 2014-02-16 ENCOUNTER — Ambulatory Visit: Payer: Self-pay | Admitting: Internal Medicine

## 2014-03-22 ENCOUNTER — Telehealth: Payer: Self-pay | Admitting: *Deleted

## 2014-03-22 NOTE — Telephone Encounter (Signed)
Rebekah Pace, Dr Baird Cancer' nurse, called regarding patient's memory loss and the family said Dr Melford Aase was going to order MRI.  Per Dr Melford Aase, he recommends patient reduce the amount of meds she is taking prior to CT or MRI.  Faxed Last note to Dr Baird Cancer at [phone].

## 2014-03-30 ENCOUNTER — Other Ambulatory Visit: Payer: Self-pay | Admitting: Internal Medicine

## 2014-03-30 ENCOUNTER — Other Ambulatory Visit (INDEPENDENT_AMBULATORY_CARE_PROVIDER_SITE_OTHER): Payer: Medicare Other

## 2014-03-30 DIAGNOSIS — F3162 Bipolar disorder, current episode mixed, moderate: Secondary | ICD-10-CM

## 2014-03-30 DIAGNOSIS — R5381 Other malaise: Secondary | ICD-10-CM

## 2014-03-30 DIAGNOSIS — E039 Hypothyroidism, unspecified: Secondary | ICD-10-CM

## 2014-03-30 DIAGNOSIS — E782 Mixed hyperlipidemia: Secondary | ICD-10-CM

## 2014-03-30 DIAGNOSIS — R7303 Prediabetes: Secondary | ICD-10-CM

## 2014-03-30 DIAGNOSIS — D649 Anemia, unspecified: Secondary | ICD-10-CM

## 2014-03-30 DIAGNOSIS — Z79899 Other long term (current) drug therapy: Secondary | ICD-10-CM

## 2014-03-30 DIAGNOSIS — R5383 Other fatigue: Secondary | ICD-10-CM

## 2014-03-31 LAB — CBC WITH DIFFERENTIAL/PLATELET
Basophils Absolute: 0.1 10*3/uL (ref 0.0–0.1)
Basophils Relative: 1 % (ref 0–1)
EOS ABS: 0.3 10*3/uL (ref 0.0–0.7)
EOS PCT: 3 % (ref 0–5)
HEMATOCRIT: 38.1 % (ref 36.0–46.0)
HEMOGLOBIN: 12.8 g/dL (ref 12.0–15.0)
LYMPHS ABS: 1.6 10*3/uL (ref 0.7–4.0)
Lymphocytes Relative: 18 % (ref 12–46)
MCH: 29.5 pg (ref 26.0–34.0)
MCHC: 33.6 g/dL (ref 30.0–36.0)
MCV: 87.8 fL (ref 78.0–100.0)
MONO ABS: 0.7 10*3/uL (ref 0.1–1.0)
MONOS PCT: 8 % (ref 3–12)
Neutro Abs: 6.2 10*3/uL (ref 1.7–7.7)
Neutrophils Relative %: 70 % (ref 43–77)
Platelets: 238 10*3/uL (ref 150–400)
RBC: 4.34 MIL/uL (ref 3.87–5.11)
RDW: 13.6 % (ref 11.5–15.5)
WBC: 8.9 10*3/uL (ref 4.0–10.5)

## 2014-03-31 LAB — BASIC METABOLIC PANEL WITH GFR
BUN: 6 mg/dL (ref 6–23)
CALCIUM: 9.5 mg/dL (ref 8.4–10.5)
CO2: 21 meq/L (ref 19–32)
Chloride: 107 mEq/L (ref 96–112)
Creat: 0.72 mg/dL (ref 0.50–1.10)
GFR, Est African American: >89 mL/min
GFR, Est Non African American: >89 mL/min
GLUCOSE: 93 mg/dL (ref 70–99)
Potassium: 3.9 mEq/L (ref 3.5–5.3)
Sodium: 136 mEq/L (ref 135–145)

## 2014-03-31 LAB — HEPATIC FUNCTION PANEL
ALBUMIN: 4 g/dL (ref 3.5–5.2)
ALK PHOS: 116 U/L (ref 39–117)
ALT: 45 U/L — ABNORMAL HIGH (ref 0–35)
AST: 51 U/L — ABNORMAL HIGH (ref 0–37)
Bilirubin, Direct: 0.2 mg/dL (ref 0.0–0.3)
Indirect Bilirubin: 0.5 mg/dL (ref 0.2–1.2)
Total Bilirubin: 0.7 mg/dL (ref 0.2–1.2)
Total Protein: 7 g/dL (ref 6.0–8.3)

## 2014-03-31 LAB — TSH: TSH: 1.563 u[IU]/mL (ref 0.350–4.500)

## 2014-03-31 LAB — FOLATE RBC: RBC FOLATE: 323 ng/mL (ref >280)

## 2014-03-31 LAB — INSULIN, FASTING: INSULIN FASTING, SERUM: 22 u[IU]/mL (ref 3–28)

## 2014-03-31 LAB — LIPID PANEL
CHOLESTEROL: 102 mg/dL (ref 0–200)
HDL: 32 mg/dL — AB (ref >39)
LDL CALC: 49 mg/dL (ref 0–99)
TRIGLYCERIDES: 107 mg/dL (ref <150)
Total CHOL/HDL Ratio: 3.2 Ratio
VLDL: 21 mg/dL (ref 0–40)

## 2014-03-31 LAB — HEMOGLOBIN A1C
Hgb A1c MFr Bld: 5.1 % (ref <5.7)
MEAN PLASMA GLUCOSE: 100 mg/dL (ref <117)

## 2014-03-31 LAB — MAGNESIUM: Magnesium: 1.9 mg/dL (ref 1.5–2.5)

## 2014-03-31 LAB — VITAMIN D 25 HYDROXY (VIT D DEFICIENCY, FRACTURES): VIT D 25 HYDROXY: 26 ng/mL — AB (ref 30–89)

## 2014-03-31 LAB — VITAMIN B12: Vitamin B-12: 420 pg/mL (ref 211–911)

## 2014-03-31 LAB — LITHIUM LEVEL: Lithium Lvl: 0.8 mEq/L (ref 0.80–1.40)

## 2014-04-05 ENCOUNTER — Ambulatory Visit: Payer: PPO | Admitting: Internal Medicine

## 2014-04-28 ENCOUNTER — Other Ambulatory Visit: Payer: Self-pay | Admitting: *Deleted

## 2014-04-28 MED ORDER — LEVOTHYROXINE SODIUM 75 MCG PO TABS: 75.0000 ug | ORAL_TABLET | Freq: Every day | ORAL | Status: DC

## 2014-05-13 ENCOUNTER — Ambulatory Visit: Payer: Self-pay | Admitting: Internal Medicine

## 2014-05-25 ENCOUNTER — Ambulatory Visit (INDEPENDENT_AMBULATORY_CARE_PROVIDER_SITE_OTHER): Payer: Medicare Other | Admitting: Internal Medicine

## 2014-05-25 ENCOUNTER — Encounter: Payer: Self-pay | Admitting: Internal Medicine

## 2014-05-25 VITALS — BP 122/76 | HR 82 | Temp 98.0°F | Resp 16 | Ht 64.0 in | Wt 176.0 lb

## 2014-05-25 DIAGNOSIS — I10 Essential (primary) hypertension: Secondary | ICD-10-CM

## 2014-05-25 DIAGNOSIS — F250 Schizoaffective disorder, bipolar type: Secondary | ICD-10-CM

## 2014-05-25 DIAGNOSIS — E039 Hypothyroidism, unspecified: Secondary | ICD-10-CM

## 2014-05-25 DIAGNOSIS — R0989 Other specified symptoms and signs involving the circulatory and respiratory systems: Secondary | ICD-10-CM

## 2014-05-25 DIAGNOSIS — Z79899 Other long term (current) drug therapy: Secondary | ICD-10-CM

## 2014-05-25 NOTE — Patient Instructions (Signed)
Hypothyroidism The thyroid is a large gland located in the lower front of your neck. The thyroid gland helps control metabolism. Metabolism is how your body handles food. It controls metabolism with the hormone thyroxine. When this gland is underactive (hypothyroid), it produces too little hormone.  CAUSES These include:   Absence or destruction of thyroid tissue.  Goiter due to iodine deficiency.  Goiter due to medications.  Congenital defects (since birth).  Problems with the pituitary. This causes a lack of TSH (thyroid stimulating hormone). This hormone tells the thyroid to turn out more hormone. SYMPTOMS  Lethargy (feeling as though you have no energy)  Cold intolerance  Weight gain (in spite of normal food intake)  Dry skin  Coarse hair  Menstrual irregularity (if severe, may lead to infertility)  Slowing of thought processes Cardiac problems are also caused by insufficient amounts of thyroid hormone. Hypothyroidism in the newborn is cretinism, and is an extreme form. It is important that this form be treated adequately and immediately or it will lead rapidly to retarded physical and mental development. DIAGNOSIS  To prove hypothyroidism, your caregiver may do blood tests and ultrasound tests. Sometimes the signs are hidden. It may be necessary for your caregiver to watch this illness with blood tests either before or after diagnosis and treatment. TREATMENT  Low levels of thyroid hormone are increased by using synthetic thyroid hormone. This is a safe, effective treatment. It usually takes about four weeks to gain the full effects of the medication. After you have the full effect of the medication, it will generally take another four weeks for problems to leave. Your caregiver may start you on low doses. If you have had heart problems the dose may be gradually increased. It is generally not an emergency to get rapidly to normal. HOME CARE INSTRUCTIONS   Take your  medications as your caregiver suggests. Let your caregiver know of any medications you are taking or start taking. Your caregiver will help you with dosage schedules.  As your condition improves, your dosage needs may increase. It will be necessary to have continuing blood tests as suggested by your caregiver.  Report all suspected medication side effects to your caregiver. SEEK MEDICAL CARE IF: Seek medical care if you develop:  Sweating.  Tremulousness (tremors).  Anxiety.  Rapid weight loss.  Heat intolerance.  Emotional swings.  Diarrhea.  Weakness. SEEK IMMEDIATE MEDICAL CARE IF:  You develop chest pain, an irregular heart beat (palpitations), or a rapid heart beat. MAKE SURE YOU:   Understand these instructions.  Will watch your condition.  Will get help right away if you are not doing well or get worse. Document Released: 09/30/2005 Document Revised: 12/23/2011 Document Reviewed: 05/20/2008 [facility] Patient Information 2015 [place], [place]. This information is not intended to replace advice given to you by your health care provider. Make sure you discuss any questions you have with your health care provider.   Lithium Toxicity Lithium is a medicine used to treat depression and bipolar disorders. Lithium poisoning (toxicity) occurs when the amount of lithium in the blood is too high. The greater the amount in the blood, the more problems occur. This is common among those taking lithium. This is because the amount of lithium needed to help the disease is not much different than the amount that can cause poisoning. Hence, it is very important to detect signs of toxicity early.  CAUSES  Lithium toxicity can occur for several reasons:  Intentional overdose.  Accidental overdose.  If  taking many medicines, confusion can lead to taking too much or not enough of one or more prescriptions.  Even if taking lithium correctly, other medicines can change levels in the blood.  Many drugs (such as medicines used for arthritis, some antibiotics, water pills (diuretics), dilantin (used for control of epilepsy), and cyclosporine (used for patients receiving transplants) can increase lithium levels.  If unrelated diseases develop, the amount of lithium in the blood might go up or down.  While working with your caregiver to fine tune your dosing, it is possible that a prescribed dose might be too much. SYMPTOMS  The symptoms of mild-to-moderate lithium toxicity are:   Diarrhea.  Vomiting.  Drowsiness.  Thirst.  Muscle weakness.  Mild shakiness, particularly of the hands.  Frequent urination. The symptoms of severe toxicity are:  Blurred vision.  Giddiness.  Ringing in the ears (Tinnitus).  Seizures.  Increased shakiness.  Severe muscle spasms.  Loss of consciousness or coma. Some people will get symptoms of mild toxicity even if the level gets close to the top of the normal range. Even if your lab test shows a blood level that is "normal", it is important to tell your caregiver if you are having any of the symptoms noted above. DIAGNOSIS  Your caregiver will look at your signs and symptoms. You will be asked about the dose of lithium you are taking and when the last dose was taken. You will also be asked about how long you have been on the medicine. Then, your caregiver will order blood tests to check your blood lithium level.  TREATMENT  Treatment is mainly supportive.   Mild symptoms can be controlled by reducing or stopping the drug. This can be restarted 24-48 hours later at a lower dose. Your caregiver may recommend changing the daily dose of lithium.  Severe cases are treated by removing lithium from the body. Care is started in a hospital emergency department.  A tube may be placed through the nose or mouth into the stomach. The tube can be used to remove lithium that has not been digested yet. It can also be used to put medicines directly  into the stomach to help stop lithium absorption.  Medicines may be used that cause diarrhea. This can help stop absorption of lithium. Your caregiver may suggest cleaning of blood using an artificial kidney (dialysis). This is usually only used for the most severe cases. HOME CARE INSTRUCTIONS   Take the dose of lithium as advised by your caregiver.  Never take more than your advised dose.  Drink plenty of water as dehydration increases the risk of lithium toxicity.  Seek the advice of your caregiver before starting a low-salt diet as reducing your salt intake can lead to toxicity.  Check your lithium levels in the blood regularly. Tests are generally done 12-18 hours after the last dose. SEEK MEDICAL CARE IF:   You notice any symptoms of lithium toxicity as described above.  You are confused or have questions about how to take your medicines. SEEK IMMEDIATE MEDICAL CARE IF:   Your symptoms are severe.  You discover that you have accidentally taken too much lithium or too much of any other medicine. MAKE SURE YOU:   Understand these instructions.  Will watch your condition.  Will get help right away if you are not doing well or get worse. Document Released: 07/28/2007 Document Revised: 12/23/2011 Document Reviewed: 07/28/2007 [facility] Patient Information 2015 [place], [place]. This information is not intended to replace advice  to you by your health care provider. Make sure you discuss any questions you have with your health care provider.  

## 2014-05-26 LAB — TSH: TSH: 1.762 u[IU]/mL (ref 0.350–4.500)

## 2014-05-26 LAB — LITHIUM LEVEL: Lithium Lvl: 0.7 mEq/L — ABNORMAL LOW (ref 0.80–1.40)

## 2014-05-26 NOTE — Progress Notes (Signed)
   Subjective:    Patient ID: Rebekah Pace, female    DOB: February 03, 1956, 58 y.o.   MRN: [id number] , HPI 53 byo S/DWF with labile HTN, Hypothyroidism and PreDiabetes returns for f/u. Currently she's followed by Dr  Baird Cancer for Bipolar 1 MDDw/HX/o schizophrenia. Patient reports she feels much more alert and mentally clearer after tapering off of her Klonopin. She also relates she feels more coordinated and less clumsy. She does report some soreness in her chest. No exertional type CP, dyspnea or palpitations.   Medication List   levothyroxine 75 MCG tablet  Commonly known as:  SYNTHROID, LEVOTHROID  Take 1 tablet (75 mcg total) by mouth daily.     lithium 300 MG tablet  Take 600 mg by mouth at bedtime.     sertraline 100 MG tablet  Commonly known as:  ZOLOFT  Take 100 mg by mouth at bedtime.     thiothixene 5 MG capsule  Commonly known as:  NAVANE  Take 5 mg by mouth 2 (two) times daily.     trihexyphenidyl 5 MG tablet  Commonly known as:  ARTANE  Take 5 mg by mouth at bedtime.     Allergies  Allergen Reactions  . Sulfa Antibiotics Rash   Past Medical History  Diagnosis Date  . Mental disorder   . Hepatitis   . Anxiety   . Depression   . Thyroid disease   . Hypothyroidism   . Prediabetes   . Labile hypertension    Review of Systems  neg otherwise   Objective:   Physical Exam BP 122/76  Pulse 82  Temp(Src) 98 F (36.7 C) (Temporal)  Resp 16  Ht 5\' 4"  (1.626 m)  Wt 176 lb (79.833 kg)  BMI 30.20 kg/m2  HEENT - Eac's patent. TM's Nl. EOM's full. PERRLA. NasoOroPharynx clear. Neck - supple. Nl Thyroid. Carotids 2+ & No bruits, nodes, JVD Chest - Clear equal BS w/o Rales, rhonchi, wheezes. Cor - Nl HS. RRR w/o sig MGR. PP 1(+). No edema. Abd - No palpable organomegaly, masses or tenderness. BS nl. MS- FROM w/o deformities. Muscle power, tone and bulk Nl. Gait Nl. Neuro - No obvious Cr N abnormalities. Sensory, motor and Cerebellar functions appear Nl w/o focal  abnormalities. Psyche - Mental status normal, much clearer and more alert than on previous visits. No delusions, ideations or obvious mood abnormalities.  1.) Chest Pain - most likely musculoskeletal 2.) Hypothyroidism  Check TSH and Lithium level today.            Assessment & Plan:

## 2014-09-01 ENCOUNTER — Encounter: Payer: Self-pay | Admitting: Internal Medicine

## 2014-09-01 ENCOUNTER — Ambulatory Visit (INDEPENDENT_AMBULATORY_CARE_PROVIDER_SITE_OTHER): Payer: Medicare Other | Admitting: Internal Medicine

## 2014-09-01 VITALS — BP 122/74 | HR 90 | Temp 97.0°F | Resp 18 | Ht 64.0 in | Wt 183.4 lb

## 2014-09-01 DIAGNOSIS — Z79899 Other long term (current) drug therapy: Secondary | ICD-10-CM | POA: Diagnosis not present

## 2014-09-01 DIAGNOSIS — E559 Vitamin D deficiency, unspecified: Secondary | ICD-10-CM | POA: Diagnosis not present

## 2014-09-01 DIAGNOSIS — R7309 Other abnormal glucose: Secondary | ICD-10-CM | POA: Diagnosis not present

## 2014-09-01 DIAGNOSIS — E782 Mixed hyperlipidemia: Secondary | ICD-10-CM | POA: Diagnosis not present

## 2014-09-01 DIAGNOSIS — R0989 Other specified symptoms and signs involving the circulatory and respiratory systems: Secondary | ICD-10-CM

## 2014-09-01 DIAGNOSIS — I10 Essential (primary) hypertension: Secondary | ICD-10-CM

## 2014-09-01 DIAGNOSIS — R7303 Prediabetes: Secondary | ICD-10-CM

## 2014-09-01 DIAGNOSIS — F315 Bipolar disorder, current episode depressed, severe, with psychotic features: Secondary | ICD-10-CM | POA: Diagnosis not present

## 2014-09-01 LAB — CBC WITH DIFFERENTIAL/PLATELET
Basophils Absolute: 0.1 10*3/uL (ref 0.0–0.1)
Basophils Relative: 1 % (ref 0–1)
EOS PCT: 5 % (ref 0–5)
Eosinophils Absolute: 0.5 10*3/uL (ref 0.0–0.7)
HEMATOCRIT: 39.4 % (ref 36.0–46.0)
HEMOGLOBIN: 13.3 g/dL (ref 12.0–15.0)
LYMPHS ABS: 2.3 10*3/uL (ref 0.7–4.0)
LYMPHS PCT: 25 % (ref 12–46)
MCH: 28.9 pg (ref 26.0–34.0)
MCHC: 33.8 g/dL (ref 30.0–36.0)
MCV: 85.7 fL (ref 78.0–100.0)
MONOS PCT: 7 % (ref 3–12)
MPV: 9.8 fL (ref 9.4–12.4)
Monocytes Absolute: 0.6 10*3/uL (ref 0.1–1.0)
NEUTROS ABS: 5.6 10*3/uL (ref 1.7–7.7)
Neutrophils Relative %: 62 % (ref 43–77)
Platelets: 247 10*3/uL (ref 150–400)
RBC: 4.6 MIL/uL (ref 3.87–5.11)
RDW: 14 % (ref 11.5–15.5)
WBC: 9.1 10*3/uL (ref 4.0–10.5)

## 2014-09-01 NOTE — Progress Notes (Signed)
Patient ID: Rebekah Pace, female   DOB: 28-Jan-1956, 58 y.o.   MRN: [id number]   This very nice 58 y.o.female presents for 3 month follow up with Hypertension, Hyperlipidemia, Pre-Diabetes and Vitamin D Deficiency. Patient did have 7-8 Psychiatric admissions in 2013 with BPMDD with Schizophrenic features. Now she is on SS Disability for her psychiatric problems.   Patient is treated for HTN & BP has been controlled at home. Today's BP: 122/74 mmHg. Patient has had no complaints of any cardiac type chest pain, palpitations, dyspnea/orthopnea/PND, dizziness, claudication, or dependent edema.   Hyperlipidemia is controlled with diet. Patient denies myalgias or other med SE's. Last Lipids were at goal -  Total Chol 102; HDL  32*; LDL  49; [id number] on  03/30/2014.   Also, the patient has history of Morbid Obesity (BMI 31.47) and consequent PreDiabetes (A1c 5.5% and elevated insulin 35 in Jan 2014) and has had no symptoms of reactive hypoglycemia, diabetic polys, paresthesias or visual blurring.  Last A1c was  5.1% on  03/30/2014.   Further, the patient also has history of Vitamin D Deficiency  and supplements vitamin D without any suspected side-effects. Last vitamin D was  26 on  03/30/2014.   Medication List   lithium 300 MG tablet  Take 600 mg by mouth at bedtime.     sertraline 100 MG tablet  Commonly known as:  ZOLOFT  Take 100 mg by mouth at bedtime.     thiothixene 5 MG capsule  Commonly known as:  NAVANE  Take 5 mg by mouth 2 (two) times daily.     trihexyphenidyl 5 MG tablet  Commonly known as:  ARTANE  Take 5 mg by mouth at bedtime.     Allergies  Allergen Reactions  . Sulfa Antibiotics Rash   PMHx:   Past Medical History  Diagnosis Date  . Mental disorder   . Hepatitis   . Anxiety   . Depression   . Thyroid disease   . Hypothyroidism   . Prediabetes   . Labile hypertension    Immunization History  Administered Date(s) Administered  . Pneumococcal-Unspecified 10/14/2001   . Td 10/15/1995   Past Surgical History  Procedure Laterality Date  . Arm fracture    . Rotator cuff repair     FHx:    Reviewed / unchanged  SHx:    Reviewed / unchanged  Systems Review:  Constitutional: Denies fever, chills, wt changes, headaches, insomnia, fatigue, night sweats, change in appetite. Eyes: Denies redness, blurred vision, diplopia, discharge, itchy, watery eyes.  ENT: Denies discharge, congestion, post nasal drip, epistaxis, sore throat, earache, hearing loss, dental pain, tinnitus, vertigo, sinus pain, snoring.  CV: Denies chest pain, palpitations, irregular heartbeat, syncope, dyspnea, diaphoresis, orthopnea, PND, claudication or edema. Respiratory: denies cough, dyspnea, DOE, pleurisy, hoarseness, laryngitis, wheezing.  Gastrointestinal: Denies dysphagia, odynophagia, heartburn, reflux, water brash, abdominal pain or cramps, nausea, vomiting, bloating, diarrhea, constipation, hematemesis, melena, hematochezia  or hemorrhoids. Genitourinary: Denies dysuria, frequency, urgency, nocturia, hesitancy, discharge, hematuria or flank pain. Musculoskeletal: Denies arthralgias, myalgias, stiffness, jt. swelling, pain, limping or strain/sprain.  Skin: Denies pruritus, rash, hives, warts, acne, eczema or change in skin lesion(s). Neuro: No weakness, tremor, incoordination, spasms, paresthesia or pain. Psychiatric: Denies confusion, memory loss or sensory loss. Endo: Denies change in weight, skin or hair change.  Heme/Lymph: No excessive bleeding, bruising or enlarged lymph nodes.  Exam:  BP 122/74   Pulse 90  Temp 97 F   Resp 18  Ht 5\' 4"   Wt 183 lb 6.4 oz   BMI 31.47  SpO2 97%  Appears well nourished and in no distress. Eyes: PERRLA, EOMs, conjunctiva no swelling or erythema. Sinuses: No frontal/maxillary tenderness ENT/Mouth: EAC's clear, TM's nl w/o erythema, bulging. Nares clear w/o erythema, swelling, exudates. Oropharynx clear without erythema or exudates.  Oral hygiene is good. Tongue normal, non obstructing. Hearing intact.  Neck: Supple. Thyroid nl. Car 2+/2+ without bruits, nodes or JVD. Chest: Respirations nl with BS clear & equal w/o rales, rhonchi, wheezing or stridor.  Cor: Heart sounds normal w/ regular rate and rhythm without sig. murmurs, gallops, clicks, or rubs. Peripheral pulses normal and equal  without edema.  Abdomen: Soft & bowel sounds normal. Non-tender w/o guarding, rebound, hernias, masses, or organomegaly.  Lymphatics: Unremarkable.  Musculoskeletal: Full ROM all peripheral extremities, joint stability, 5/5 strength, and normal gait.  Skin: Warm, dry without exposed rashes, lesions or ecchymosis apparent.  Neuro: Cranial nerves intact, reflexes equal bilaterally. Sensory-motor testing grossly intact. Tendon reflexes grossly intact.  Pysch: Alert & oriented x 3.  Insight and judgement nl & appropriate. No ideations.  Assessment and Plan:  1. Hypertension - Continue monitor blood pressure at home. Continue diet/meds same.  2. Hyperlipidemia - Continue diet/meds, exercise,& lifestyle modifications. Continue monitor periodic cholesterol/liver & renal functions   3. Pre-Diabetes - Continue diet, exercise, lifestyle modifications. Monitor appropriate labs.  4. Vitamin D Deficiency - Continue supplementation.  5. Bipolar Manic-Depressive Disorder w/hx/o Psychosis -   6. Hypothyroidism - compensated  7. Morbid Obesity (BMI 31.47) - Diet discussed     Recommended regular exercise, BP monitoring, weight control, and discussed med and SE's. Recommended labs to assess and monitor clinical status. Further disposition pending results of labs.

## 2014-09-02 LAB — BASIC METABOLIC PANEL WITH GFR
BUN: 12 mg/dL (ref 6–23)
CHLORIDE: 105 meq/L (ref 96–112)
CO2: 24 mEq/L (ref 19–32)
Calcium: 9.4 mg/dL (ref 8.4–10.5)
Creat: 0.83 mg/dL (ref 0.50–1.10)
GFR, EST NON AFRICAN AMERICAN: 77 mL/min
GFR, Est African American: 89 mL/min
Glucose, Bld: 108 mg/dL — ABNORMAL HIGH (ref 70–99)
POTASSIUM: 4.3 meq/L (ref 3.5–5.3)
Sodium: 137 mEq/L (ref 135–145)

## 2014-09-02 LAB — LIPID PANEL
CHOL/HDL RATIO: 3.9 ratio
Cholesterol: 122 mg/dL (ref 0–200)
HDL: 31 mg/dL — AB (ref >39)
LDL Cholesterol: 40 mg/dL (ref 0–99)
Triglycerides: 256 mg/dL — ABNORMAL HIGH (ref <150)
VLDL: 51 mg/dL — AB (ref 0–40)

## 2014-09-02 LAB — HEPATIC FUNCTION PANEL
ALK PHOS: 102 U/L (ref 39–117)
ALT: 60 U/L — ABNORMAL HIGH (ref 0–35)
AST: 72 U/L — AB (ref 0–37)
Albumin: 3.8 g/dL (ref 3.5–5.2)
BILIRUBIN INDIRECT: 0.3 mg/dL (ref 0.2–1.2)
Bilirubin, Direct: 0.2 mg/dL (ref 0.0–0.3)
TOTAL PROTEIN: 7.2 g/dL (ref 6.0–8.3)
Total Bilirubin: 0.5 mg/dL (ref 0.2–1.2)

## 2014-09-02 LAB — HEMOGLOBIN A1C
Hgb A1c MFr Bld: 5.2 % (ref <5.7)
Mean Plasma Glucose: 103 mg/dL (ref <117)

## 2014-09-02 LAB — LITHIUM LEVEL: Lithium Lvl: 0.4 mEq/L — ABNORMAL LOW (ref 0.80–1.40)

## 2014-09-02 LAB — INSULIN, FASTING: Insulin fasting, serum: 49.6 u[IU]/mL — ABNORMAL HIGH (ref 2.0–19.6)

## 2014-09-02 LAB — VITAMIN D 25 HYDROXY (VIT D DEFICIENCY, FRACTURES): VIT D 25 HYDROXY: 29 ng/mL — AB (ref 30–100)

## 2014-09-02 LAB — MAGNESIUM: MAGNESIUM: 1.9 mg/dL (ref 1.5–2.5)

## 2014-09-02 LAB — TSH: TSH: 2.145 u[IU]/mL (ref 0.350–4.500)

## 2014-11-21 ENCOUNTER — Other Ambulatory Visit: Payer: Self-pay | Admitting: *Deleted

## 2014-11-21 MED ORDER — LEVOTHYROXINE SODIUM 75 MCG PO TABS: 75.0000 ug | ORAL_TABLET | Freq: Every day | ORAL | Status: DC

## 2014-12-12 ENCOUNTER — Ambulatory Visit: Payer: Self-pay | Admitting: Physician Assistant

## 2014-12-13 ENCOUNTER — Encounter: Payer: Self-pay | Admitting: Internal Medicine

## 2014-12-13 ENCOUNTER — Ambulatory Visit (INDEPENDENT_AMBULATORY_CARE_PROVIDER_SITE_OTHER): Payer: PPO | Admitting: Internal Medicine

## 2014-12-13 VITALS — BP 118/66 | HR 76 | Temp 97.3°F | Resp 16 | Ht 64.0 in | Wt 188.0 lb

## 2014-12-13 DIAGNOSIS — E032 Hypothyroidism due to medicaments and other exogenous substances: Secondary | ICD-10-CM

## 2014-12-13 DIAGNOSIS — Z79899 Other long term (current) drug therapy: Secondary | ICD-10-CM

## 2014-12-13 DIAGNOSIS — R7303 Prediabetes: Secondary | ICD-10-CM

## 2014-12-13 DIAGNOSIS — I10 Essential (primary) hypertension: Secondary | ICD-10-CM

## 2014-12-13 DIAGNOSIS — E559 Vitamin D deficiency, unspecified: Secondary | ICD-10-CM

## 2014-12-13 DIAGNOSIS — R0989 Other specified symptoms and signs involving the circulatory and respiratory systems: Secondary | ICD-10-CM

## 2014-12-13 DIAGNOSIS — R7309 Other abnormal glucose: Secondary | ICD-10-CM

## 2014-12-13 DIAGNOSIS — F315 Bipolar disorder, current episode depressed, severe, with psychotic features: Secondary | ICD-10-CM

## 2014-12-13 DIAGNOSIS — E039 Hypothyroidism, unspecified: Secondary | ICD-10-CM | POA: Insufficient documentation

## 2014-12-13 DIAGNOSIS — E782 Mixed hyperlipidemia: Secondary | ICD-10-CM

## 2014-12-13 LAB — CBC WITH DIFFERENTIAL/PLATELET
BASOS ABS: 0.1 10*3/uL (ref 0.0–0.1)
BASOS PCT: 1 % (ref 0–1)
EOS ABS: 0.3 10*3/uL (ref 0.0–0.7)
Eosinophils Relative: 5 % (ref 0–5)
HEMATOCRIT: 39.5 % (ref 36.0–46.0)
Hemoglobin: 13.2 g/dL (ref 12.0–15.0)
Lymphocytes Relative: 24 % (ref 12–46)
Lymphs Abs: 1.6 10*3/uL (ref 0.7–4.0)
MCH: 29.2 pg (ref 26.0–34.0)
MCHC: 33.4 g/dL (ref 30.0–36.0)
MCV: 87.4 fL (ref 78.0–100.0)
MONOS PCT: 12 % (ref 3–12)
MPV: 9.9 fL (ref 8.6–12.4)
Monocytes Absolute: 0.8 10*3/uL (ref 0.1–1.0)
NEUTROS ABS: 3.9 10*3/uL (ref 1.7–7.7)
NEUTROS PCT: 58 % (ref 43–77)
PLATELETS: 198 10*3/uL (ref 150–400)
RBC: 4.52 MIL/uL (ref 3.87–5.11)
RDW: 13.9 % (ref 11.5–15.5)
WBC: 6.8 10*3/uL (ref 4.0–10.5)

## 2014-12-13 LAB — HEMOGLOBIN A1C
Hgb A1c MFr Bld: 5.3 % (ref <5.7)
Mean Plasma Glucose: 105 mg/dL (ref <117)

## 2014-12-13 NOTE — Patient Instructions (Signed)

## 2014-12-13 NOTE — Progress Notes (Signed)
Patient ID: Rebekah Pace, female   DOB: [birth year], 59 y.o.   MRN: [id number]   This very nice 59 y.o. DWF presents for 3 month follow up with Hypertension, Hyperlipidemia, Pre-Diabetes and Vitamin D Deficiency. Patient has hx/o bipolar manic-depressive disorder currently stabilized & pt's followed by a Dr Sammuel Cooper w/ the Act Team.   Patient is treated for HTN & BP has been controlled at home. Today's BP: 118/66 mmHg. Patient has had no complaints of any cardiac type chest pain, palpitations, dyspnea/orthopnea/PND, dizziness, claudication, or dependent edema.   Hyperlipidemia is controlled with diet & meds. Patient denies myalgias or other med SE's. Last Lipids were at goal - Total Chol 122; HDL 31*; LDL 40; and elevated Trig 256 on 09/01/2014.   Also, the patient has history of PreDiabetes and has had no symptoms of reactive hypoglycemia, diabetic polys, paresthesias or visual blurring.  Last A1c was  5.2% on 09/01/2014.   Further, the patient also has history of Vitamin D Deficiency and supplements vitamin D without any suspected side-effects. Last vitamin D was 29 on  09/01/2014.  Medication Sig  . levothyroxine  75 MCG tablet Take 1 tablet  daily.  Marland Kitchen lithium 300 MG tablet Take 600 mg by mouth at bedtime.  . sertraline (ZOLOFT) 100 MG tablet Take 100 mg by mouth at bedtime.  Marland Kitchen thiothixene (NAVANE) 5 MG capsule Take 5 mg by mouth 2  times daily.  . trihexyphenidyl (ARTANE) 5 MG tablet Take 5 mg by mouth at bedtime.   Allergies  Allergen Reactions  . Sulfa Antibiotics Rash   PMHx:   Past Medical History  Diagnosis Date  . Mental disorder   . Hepatitis   . Anxiety   . Depression   . Thyroid disease   . Hypothyroidism   . Prediabetes   . Labile hypertension    Immunization History  Administered Date(s) Administered  . Pneumococcal-Unspecified 10/14/2001  . Td 10/15/1995   Past Surgical History  Procedure Laterality Date  . Arm fracture    . Rotator cuff repair     FHx:     Reviewed / unchanged  SHx:    Reviewed / unchanged  Systems Review:  Constitutional: Denies fever, chills, wt changes, headaches, insomnia, fatigue, night sweats, change in appetite. Eyes: Denies redness, blurred vision, diplopia, discharge, itchy, watery eyes.  ENT: Denies discharge, congestion, post nasal drip, epistaxis, sore throat, earache, hearing loss, dental pain, tinnitus, vertigo, sinus pain, snoring.  CV: Denies chest pain, palpitations, irregular heartbeat, syncope, dyspnea, diaphoresis, orthopnea, PND, claudication or edema. Respiratory: denies cough, dyspnea, DOE, pleurisy, hoarseness, laryngitis, wheezing.  Gastrointestinal: Denies dysphagia, odynophagia, heartburn, reflux, water brash, abdominal pain or cramps, nausea, vomiting, bloating, diarrhea, constipation, hematemesis, melena, hematochezia  or hemorrhoids. Genitourinary: Denies dysuria, frequency, urgency, nocturia, hesitancy, discharge, hematuria or flank pain. Musculoskeletal: Denies arthralgias, myalgias, stiffness, jt. swelling, pain, limping or strain/sprain.  Skin: Denies pruritus, rash, hives, warts, acne, eczema or change in skin lesion(s). Neuro: No weakness, tremor, incoordination, spasms, paresthesia or pain. Psychiatric: Denies confusion, memory loss or sensory loss. Endo: Denies change in weight, skin or hair change.  Heme/Lymph: No excessive bleeding, bruising or enlarged lymph nodes.  Physical Exam  BP 118/66   Pulse 76  Temp 97.3 F   Resp 16  Ht 5\' 4"    Wt 188 lb     BMI 32.25   Appears well nourished and in no distress. Eyes: PERRLA, EOMs, conjunctiva no swelling or erythema. Sinuses: No frontal/maxillary tenderness ENT/Mouth: EAC's clear,  TM's nl w/o erythema, bulging. Nares clear w/o erythema, swelling, exudates. Oropharynx clear without erythema or exudates. Oral hygiene is good. Tongue normal, non obstructing. Hearing intact.  Neck: Supple. Thyroid nl. Car 2+/2+ without bruits, nodes or  JVD. Chest: Respirations nl with BS clear & equal w/o rales, rhonchi, wheezing or stridor.  Cor: Heart sounds normal w/ regular rate and rhythm without sig. murmurs, gallops, clicks, or rubs. Peripheral pulses normal and equal  without edema.  Abdomen: Soft & bowel sounds normal. Non-tender w/o guarding, rebound, hernias, masses, or organomegaly.  Lymphatics: Unremarkable.  Musculoskeletal: Full ROM all peripheral extremities, joint stability, 5/5 strength, and normal gait.  Skin: Warm, dry without exposed rashes, lesions or ecchymosis apparent.  Neuro: Cranial nerves intact, reflexes equal bilaterally. Sensory-motor testing grossly intact. Tendon reflexes grossly intact.  Pysch: Alert & oriented x 3.  Insight and judgement nl & appropriate. No ideations.  Assessment and Plan:  1. Labile hypertension   2. Mixed hyperlipidemia  - Lipid panel  3. Prediabetes   4. Vitamin D deficiency  - Hemoglobin A1c - Insulin, fasting - Vit D  25 hydroxy (rtn osteoporosis monitoring)  5. Hypothyroidism   - TSH  6. Bipolar affective disorder, depressed, severe, with psychotic behavior  - Lithium level  7. Hypothyroidism   8. Medication management  - CBC with Differential/Platelet - BASIC METABOLIC PANEL WITH GFR - Hepatic function panel - Magnesium - Lithium level   Recommended regular exercise, BP monitoring, weight control, and discussed med and SE's. Recommended labs to assess and monitor clinical status. Further disposition pending results of labs.

## 2014-12-14 LAB — HEPATIC FUNCTION PANEL
ALK PHOS: 97 U/L (ref 39–117)
ALT: 52 U/L — ABNORMAL HIGH (ref 0–35)
AST: 62 U/L — ABNORMAL HIGH (ref 0–37)
Albumin: 3.8 g/dL (ref 3.5–5.2)
BILIRUBIN DIRECT: 0.2 mg/dL (ref 0.0–0.3)
BILIRUBIN INDIRECT: 0.3 mg/dL (ref 0.2–1.2)
BILIRUBIN TOTAL: 0.5 mg/dL (ref 0.2–1.2)
Total Protein: 6.6 g/dL (ref 6.0–8.3)

## 2014-12-14 LAB — LITHIUM LEVEL: LITHIUM LVL: 0.5 meq/L — AB (ref 0.80–1.40)

## 2014-12-14 LAB — INSULIN, FASTING: Insulin fasting, serum: 97.5 u[IU]/mL — ABNORMAL HIGH (ref 2.0–19.6)

## 2014-12-14 LAB — BASIC METABOLIC PANEL WITH GFR
BUN: 9 mg/dL (ref 6–23)
CHLORIDE: 107 meq/L (ref 96–112)
CO2: 23 meq/L (ref 19–32)
Calcium: 9.3 mg/dL (ref 8.4–10.5)
Creat: 0.79 mg/dL (ref 0.50–1.10)
GFR, EST NON AFRICAN AMERICAN: 82 mL/min
GFR, Est African American: >89 mL/min
Glucose, Bld: 127 mg/dL — ABNORMAL HIGH (ref 70–99)
Potassium: 3.9 mEq/L (ref 3.5–5.3)
Sodium: 139 mEq/L (ref 135–145)

## 2014-12-14 LAB — LIPID PANEL
CHOL/HDL RATIO: 4.4 ratio
Cholesterol: 127 mg/dL (ref 0–200)
HDL: 29 mg/dL — ABNORMAL LOW (ref >=46)
LDL Cholesterol: 60 mg/dL (ref 0–99)
Triglycerides: 191 mg/dL — ABNORMAL HIGH (ref <150)
VLDL: 38 mg/dL (ref 0–40)

## 2014-12-14 LAB — MAGNESIUM: MAGNESIUM: 1.7 mg/dL (ref 1.5–2.5)

## 2014-12-14 LAB — TSH: TSH: 1.73 u[IU]/mL (ref 0.350–4.500)

## 2014-12-14 LAB — VITAMIN D 25 HYDROXY (VIT D DEFICIENCY, FRACTURES): Vit D, 25-Hydroxy: 25 ng/mL — ABNORMAL LOW (ref 30–100)

## 2014-12-16 ENCOUNTER — Telehealth: Payer: Self-pay | Admitting: *Deleted

## 2014-12-16 NOTE — Telephone Encounter (Signed)
Pt aware of lab results 

## 2015-03-20 ENCOUNTER — Ambulatory Visit (INDEPENDENT_AMBULATORY_CARE_PROVIDER_SITE_OTHER): Payer: PPO | Admitting: Internal Medicine

## 2015-03-20 ENCOUNTER — Encounter: Payer: Self-pay | Admitting: Internal Medicine

## 2015-03-20 VITALS — BP 128/66 | HR 76 | Temp 98.0°F | Resp 18 | Ht 64.0 in | Wt 188.0 lb

## 2015-03-20 DIAGNOSIS — R0989 Other specified symptoms and signs involving the circulatory and respiratory systems: Secondary | ICD-10-CM

## 2015-03-20 DIAGNOSIS — I10 Essential (primary) hypertension: Secondary | ICD-10-CM

## 2015-03-20 DIAGNOSIS — R7303 Prediabetes: Secondary | ICD-10-CM

## 2015-03-20 DIAGNOSIS — R7309 Other abnormal glucose: Secondary | ICD-10-CM

## 2015-03-20 DIAGNOSIS — E039 Hypothyroidism, unspecified: Secondary | ICD-10-CM

## 2015-03-20 DIAGNOSIS — E782 Mixed hyperlipidemia: Secondary | ICD-10-CM

## 2015-03-20 DIAGNOSIS — F250 Schizoaffective disorder, bipolar type: Secondary | ICD-10-CM

## 2015-03-20 DIAGNOSIS — E559 Vitamin D deficiency, unspecified: Secondary | ICD-10-CM

## 2015-03-20 DIAGNOSIS — Z79899 Other long term (current) drug therapy: Secondary | ICD-10-CM

## 2015-03-20 LAB — BASIC METABOLIC PANEL WITH GFR
BUN: 10 mg/dL (ref 6–23)
CO2: 26 mEq/L (ref 19–32)
CREATININE: 0.84 mg/dL (ref 0.50–1.10)
Calcium: 9.5 mg/dL (ref 8.4–10.5)
Chloride: 104 mEq/L (ref 96–112)
GFR, Est African American: 88 mL/min
GFR, Est Non African American: 76 mL/min
GLUCOSE: 109 mg/dL — AB (ref 70–99)
Potassium: 4 mEq/L (ref 3.5–5.3)
Sodium: 137 mEq/L (ref 135–145)

## 2015-03-20 LAB — HEPATIC FUNCTION PANEL
ALT: 52 U/L — AB (ref 0–35)
AST: 64 U/L — AB (ref 0–37)
Albumin: 3.6 g/dL (ref 3.5–5.2)
Alkaline Phosphatase: 83 U/L (ref 39–117)
BILIRUBIN INDIRECT: 0.4 mg/dL (ref 0.2–1.2)
Bilirubin, Direct: 0.2 mg/dL (ref 0.0–0.3)
Total Bilirubin: 0.6 mg/dL (ref 0.2–1.2)
Total Protein: 6.7 g/dL (ref 6.0–8.3)

## 2015-03-20 LAB — CBC WITH DIFFERENTIAL/PLATELET
BASOS ABS: 0.1 10*3/uL (ref 0.0–0.1)
Basophils Relative: 1 % (ref 0–1)
EOS ABS: 0.4 10*3/uL (ref 0.0–0.7)
Eosinophils Relative: 5 % (ref 0–5)
HEMATOCRIT: 39.4 % (ref 36.0–46.0)
Hemoglobin: 13.1 g/dL (ref 12.0–15.0)
LYMPHS ABS: 1.5 10*3/uL (ref 0.7–4.0)
LYMPHS PCT: 20 % (ref 12–46)
MCH: 29.4 pg (ref 26.0–34.0)
MCHC: 33.2 g/dL (ref 30.0–36.0)
MCV: 88.3 fL (ref 78.0–100.0)
MONOS PCT: 11 % (ref 3–12)
MPV: 9.4 fL (ref 8.6–12.4)
Monocytes Absolute: 0.8 10*3/uL (ref 0.1–1.0)
Neutro Abs: 4.8 10*3/uL (ref 1.7–7.7)
Neutrophils Relative %: 63 % (ref 43–77)
PLATELETS: 219 10*3/uL (ref 150–400)
RBC: 4.46 MIL/uL (ref 3.87–5.11)
RDW: 14.3 % (ref 11.5–15.5)
WBC: 7.6 10*3/uL (ref 4.0–10.5)

## 2015-03-20 LAB — MAGNESIUM: Magnesium: 1.9 mg/dL (ref 1.5–2.5)

## 2015-03-20 LAB — LIPID PANEL
Cholesterol: 123 mg/dL (ref 0–200)
HDL: 30 mg/dL — AB (ref >=46)
LDL CALC: 60 mg/dL (ref 0–99)
TRIGLYCERIDES: 163 mg/dL — AB (ref <150)
Total CHOL/HDL Ratio: 4.1 Ratio
VLDL: 33 mg/dL (ref 0–40)

## 2015-03-20 LAB — HEMOGLOBIN A1C
Hgb A1c MFr Bld: 5.2 % (ref <5.7)
MEAN PLASMA GLUCOSE: 103 mg/dL (ref <117)

## 2015-03-20 LAB — TSH: TSH: 2.429 u[IU]/mL (ref 0.350–4.500)

## 2015-03-20 NOTE — Progress Notes (Signed)
Patient ID: Rebekah Pace, female   DOB: [birth year], 59 y.o.   MRN: [id number]  Assessment and Plan:  Hypertension:  -Continue medication,  -monitor blood pressure at home.  -Continue DASH diet.   -Reminder to go to the ER if any CP, SOB, nausea, dizziness, severe HA, changes vision/speech, left arm numbness and tingling, and jaw pain.  Cholesterol: -Continue diet and exercise.  -Check cholesterol.   Pre-diabetes: -Continue diet and exercise.  -Check A1C  Vitamin D Def: -check level -continue medications.   Bipolar Disorder -lithium level -cont current med regime -managed by psych  Continue diet and meds as discussed. Further disposition pending results of labs.  HPI 59 y.o. female  presents for 3 month follow up with hypertension, hyperlipidemia, prediabetes and vitamin D.   Her blood pressure has been controlled at home, today their BP is BP: 128/66 mmHg.   She does not workout.  She reports that she does a lot of gardening and yard work.  She reports that BP has been very well controlled at home for her.   She denies chest pain, shortness of breath, dizziness.   She is on cholesterol medication and denies myalgias. Her cholesterol is at goal. The cholesterol last visit was:   Lab Results  Component Value Date   CHOL 127 12/13/2014   HDL 29* 12/13/2014   LDLCALC 60 12/13/2014   TRIG 191* 12/13/2014   CHOLHDL 4.4 12/13/2014     She has been working on diet and exercise for prediabetes, and denies foot ulcerations, hyperglycemia, hypoglycemia , increased appetite, nausea, paresthesia of the feet, polydipsia, polyuria, visual disturbances, vomiting and weight loss. Last A1C in the office was:  Lab Results  Component Value Date   HGBA1C 5.3 12/13/2014    Patient is on Vitamin D supplement.  Lab Results  Component Value Date   VD25OH 25* 12/13/2014      Current Medications:  Current Outpatient Prescriptions on File Prior to Visit  Medication Sig Dispense Refill  .  levothyroxine (SYNTHROID, LEVOTHROID) 75 MCG tablet Take 1 tablet (75 mcg total) by mouth daily. 30 tablet 6  . lithium 300 MG tablet Take 600 mg by mouth at bedtime.    . sertraline (ZOLOFT) 100 MG tablet Take 100 mg by mouth at bedtime.    Marland Kitchen thiothixene (NAVANE) 5 MG capsule Take 5 mg by mouth 2 (two) times daily.    . trihexyphenidyl (ARTANE) 5 MG tablet Take 5 mg by mouth at bedtime.     No current facility-administered medications on file prior to visit.    Medical History:  Past Medical History  Diagnosis Date  . Mental disorder   . Hepatitis   . Anxiety   . Depression   . Thyroid disease   . Hypothyroidism   . Prediabetes   . Labile hypertension     Allergies:  Allergies  Allergen Reactions  . Sulfa Antibiotics Rash     Review of Systems:  Review of Systems  Constitutional: Negative for fever, chills and malaise/fatigue.  HENT: Negative for congestion, ear pain and sore throat.   Eyes: Negative.   Respiratory: Positive for cough. Negative for shortness of breath and wheezing.   Cardiovascular: Negative for chest pain, palpitations and leg swelling.  Gastrointestinal: Positive for heartburn. Negative for nausea, vomiting, diarrhea, constipation, blood in stool and melena.  Genitourinary: Negative for dysuria, urgency and frequency.  Skin: Negative.   Neurological: Negative for dizziness, tingling, sensory change, loss of consciousness and headaches.  Family history- Review and unchanged  Social history- Review and unchanged  Physical Exam: BP 128/66 mmHg  Pulse 76  Temp(Src) 98 F (36.7 C) (Temporal)  Resp 18  Ht 5\' 4"  (1.626 m)  Wt 188 lb (85.276 kg)  BMI 32.25 kg/m2 Wt Readings from Last 3 Encounters:  03/20/15 188 lb (85.276 kg)  12/13/14 188 lb (85.276 kg)  09/01/14 183 lb 6.4 oz (83.19 kg)    General Appearance: Well nourished well developed, in no apparent distress. Eyes: PERRLA, EOMs, conjunctiva no swelling or erythema ENT/Mouth: Ear  canals normal without obstruction, swelling, erythma, discharge.  TMs normal bilaterally.  Oropharynx moist, clear, without exudate, or postoropharyngeal swelling. Neck: Supple, thyroid normal,no cervical adenopathy  Respiratory: Respiratory effort normal, Breath sounds clear A&P without rhonchi, wheeze, or rale.  No retractions, no accessory usage. Cardio: RRR with no MRGs. Brisk peripheral pulses without edema.  Abdomen: Soft, + BS,  Non tender, no guarding, rebound, hernias, masses. Musculoskeletal: Full ROM, 5/5 strength, Normal gait Skin: Warm, dry without rashes, lesions, ecchymosis.  Neuro: Awake and oriented X 3, Cranial nerves intact. Normal muscle tone, no cerebellar symptoms. Psych: Flat affect, Insight and Judgment appropriate.    FORCUCCI, Kingslee Mairena, PA-C 10:30 AM [facility]

## 2015-03-20 NOTE — Patient Instructions (Signed)

## 2015-03-21 LAB — LITHIUM LEVEL: Lithium Lvl: 0.5 mEq/L — ABNORMAL LOW (ref 0.80–1.40)

## 2015-03-21 LAB — INSULIN, RANDOM: INSULIN: 55 u[IU]/mL — AB (ref 2.0–19.6)

## 2015-03-23 LAB — VITAMIN D 1,25 DIHYDROXY
VITAMIN D 1, 25 (OH) TOTAL: 30 pg/mL (ref 18–72)
VITAMIN D3 1, 25 (OH): 30 pg/mL

## 2015-04-07 DIAGNOSIS — F331 Major depressive disorder, recurrent, moderate: Secondary | ICD-10-CM | POA: Diagnosis not present

## 2015-04-14 DIAGNOSIS — F331 Major depressive disorder, recurrent, moderate: Secondary | ICD-10-CM | POA: Diagnosis not present

## 2015-04-19 ENCOUNTER — Encounter: Payer: Self-pay | Admitting: Internal Medicine

## 2015-05-04 ENCOUNTER — Other Ambulatory Visit: Payer: Self-pay | Admitting: *Deleted

## 2015-05-04 DIAGNOSIS — F331 Major depressive disorder, recurrent, moderate: Secondary | ICD-10-CM | POA: Diagnosis not present

## 2015-05-04 MED ORDER — LEVOTHYROXINE SODIUM 75 MCG PO TABS: 75.0000 ug | ORAL_TABLET | Freq: Every day | ORAL | Status: DC

## 2015-05-08 DIAGNOSIS — F331 Major depressive disorder, recurrent, moderate: Secondary | ICD-10-CM | POA: Diagnosis not present

## 2015-05-31 DIAGNOSIS — F331 Major depressive disorder, recurrent, moderate: Secondary | ICD-10-CM | POA: Diagnosis not present

## 2015-06-08 ENCOUNTER — Ambulatory Visit (INDEPENDENT_AMBULATORY_CARE_PROVIDER_SITE_OTHER): Payer: PPO | Admitting: Podiatry

## 2015-06-08 ENCOUNTER — Ambulatory Visit (INDEPENDENT_AMBULATORY_CARE_PROVIDER_SITE_OTHER): Payer: PPO

## 2015-06-08 ENCOUNTER — Encounter: Payer: Self-pay | Admitting: Podiatry

## 2015-06-08 VITALS — BP 109/64 | HR 71 | Resp 16 | Ht 64.0 in | Wt 160.0 lb

## 2015-06-08 DIAGNOSIS — M722 Plantar fascial fibromatosis: Secondary | ICD-10-CM

## 2015-06-08 NOTE — Progress Notes (Signed)
   Subjective:    Patient ID: [name], female    DOB: 11-23-1955, 59 y.o.   MRN: [id number]  HPI :  She presents today with chief complaint of pain to the right plantar heel and lateral aspect of the foot. She states is been like this for a couple of months and has not done anything for treatment. Mornings are particularly bad she states. She denies trauma.    Review of Systems  All other systems reviewed and are negative.      Objective:   Physical Exam : 59 year old female in no apparent distress with vital signs stable alert and oriented 3 presents strong palpable pulses bilateral. Neurologic sensorium is intact for Semmes-Weinstein monofilament. Deep tendon reflexes are intact bilateral and muscle strength is 5 over 5 dorsiflexors plantar flexors and inverters everters all edges of musculature is intact. Orthopedic evaluation demonstrate solid joints distal to the ankle for range of motion without crepitation. She does have pain on palpation medial calcaneal tubercle of the right heel. Radiographs 3 views of the right foot today does demonstrate a soft tissue increase in density at the plantar fascial calcaneal insertion site of the right foot consistent with plantar fasciitis. She does have some tenderness on palpation of the lateral aspect of the foot consistent with lateral compensatory syndrome. Cutaneous evaluation demonstrates supple well-hydrated cutis no erythema edema cellulitis drainage or odor.        Assessment & Plan:   59 year old female presents with plantar fasciitis and lateral compensatory syndrome right foot.  started her on use of a night splint after an injection and Kenalog and local anesthesia. We discussed appropriate shoe gear stretching exercises ice therapy and shoe modifications. I'll follow-up with her in 1 month.   Roselind Messier DPM

## 2015-06-08 NOTE — Patient Instructions (Signed)

## 2015-06-15 ENCOUNTER — Ambulatory Visit (INDEPENDENT_AMBULATORY_CARE_PROVIDER_SITE_OTHER): Payer: PPO | Admitting: Internal Medicine

## 2015-06-15 VITALS — BP 136/80 | HR 88 | Temp 98.2°F | Resp 18 | Ht 64.0 in | Wt 187.0 lb

## 2015-06-15 DIAGNOSIS — R300 Dysuria: Secondary | ICD-10-CM

## 2015-06-15 MED ORDER — CIPROFLOXACIN HCL 500 MG PO TABS: 500.0000 mg | ORAL_TABLET | Freq: Two times a day (BID) | ORAL | Status: AC

## 2015-06-15 MED ORDER — PHENAZOPYRIDINE HCL 100 MG PO TABS: 100.0000 mg | ORAL_TABLET | Freq: Three times a day (TID) | ORAL | Status: DC | PRN

## 2015-06-15 NOTE — Patient Instructions (Signed)

## 2015-06-15 NOTE — Progress Notes (Signed)
   Subjective:    Patient ID: Rebekah Pace, female    DOB: Jan 19, 1956, 59 y.o.   MRN: [id number]  Dysuria  Associated symptoms include flank pain, frequency and nausea. Pertinent negatives include no chills, hematuria, urgency or vomiting.  Patient reports to the office for evaluation of urinary frequency and dysuria x 3 days.  She reports that she has been having to get up multiple times per night, her low back hurts, and she is having hesitancy.  She reports that she does not usually have UTIs.  She has not had any relief from ibuprofen.  She also reports that she has tried tylenol.  No relief of symptoms.      Review of Systems  Constitutional: Negative for fever, chills and fatigue.  Respiratory: Negative for chest tightness and shortness of breath.   Gastrointestinal: Positive for nausea, abdominal pain and diarrhea. Negative for vomiting, constipation and blood in stool.  Genitourinary: Positive for dysuria, frequency, flank pain and difficulty urinating. Negative for urgency, hematuria, vaginal bleeding and vaginal discharge.  Musculoskeletal: Positive for back pain.       Objective:   Physical Exam  Constitutional: She is oriented to person, place, and time. She appears well-developed and well-nourished. No distress.  HENT:  Head: Normocephalic.  Mouth/Throat: Oropharynx is clear and moist. No oropharyngeal exudate.  Eyes: Conjunctivae are normal. No scleral icterus.  Neck: Normal range of motion. Neck supple. No JVD present. No thyromegaly present.  Cardiovascular: Normal rate, regular rhythm, normal heart sounds and intact distal pulses.  Exam reveals no gallop and no friction rub.   No murmur heard. Pulmonary/Chest: Effort normal and breath sounds normal. No respiratory distress. She has no wheezes. She has no rales. She exhibits no tenderness.  Abdominal: Soft. Bowel sounds are normal. She exhibits no distension and no mass. There is tenderness in the suprapubic area. There  is no rebound, no guarding and no CVA tenderness.  Musculoskeletal: Normal range of motion.  Lymphadenopathy:    She has no cervical adenopathy.  Neurological: She is alert and oriented to person, place, and time.  Skin: Skin is warm and dry. She is not diaphoretic.  Psychiatric: She has a normal mood and affect. Her behavior is normal. Judgment and thought content normal.  Nursing note and vitals reviewed.         Assessment & Plan:    1. Dysuria -cipro -azo - Urinalysis, Routine w reflex microscopic (not at [facility]) - Culture, Urine

## 2015-06-16 ENCOUNTER — Other Ambulatory Visit: Payer: Self-pay | Admitting: Internal Medicine

## 2015-06-16 LAB — URINALYSIS, MICROSCOPIC ONLY
Casts: NONE SEEN [LPF]
Crystals: NONE SEEN [HPF]
Yeast: NONE SEEN [HPF]

## 2015-06-16 LAB — URINALYSIS, ROUTINE W REFLEX MICROSCOPIC
BILIRUBIN URINE: NEGATIVE
GLUCOSE, UA: NEGATIVE
Nitrite: NEGATIVE
SPECIFIC GRAVITY, URINE: 1.022 (ref 1.001–1.035)
pH: 6 (ref 5.0–8.0)

## 2015-06-16 LAB — URINE CULTURE: Colony Count: >=100000

## 2015-06-16 NOTE — Progress Notes (Signed)
Patient aware.

## 2015-06-23 DIAGNOSIS — F331 Major depressive disorder, recurrent, moderate: Secondary | ICD-10-CM | POA: Diagnosis not present

## 2015-06-28 DIAGNOSIS — F331 Major depressive disorder, recurrent, moderate: Secondary | ICD-10-CM | POA: Diagnosis not present

## 2015-07-13 ENCOUNTER — Encounter (INDEPENDENT_AMBULATORY_CARE_PROVIDER_SITE_OTHER): Payer: PPO | Admitting: Podiatry

## 2015-07-13 NOTE — Progress Notes (Signed)
This encounter was created in error - please disregard.

## 2015-07-24 ENCOUNTER — Encounter: Payer: Self-pay | Admitting: Internal Medicine

## 2015-07-24 ENCOUNTER — Ambulatory Visit (INDEPENDENT_AMBULATORY_CARE_PROVIDER_SITE_OTHER): Payer: PPO | Admitting: Internal Medicine

## 2015-07-24 VITALS — BP 120/80 | HR 80 | Temp 97.6°F | Resp 16 | Ht 64.0 in | Wt 185.6 lb

## 2015-07-24 DIAGNOSIS — Z6831 Body mass index (BMI) 31.0-31.9, adult: Secondary | ICD-10-CM

## 2015-07-24 DIAGNOSIS — R7303 Prediabetes: Secondary | ICD-10-CM | POA: Diagnosis not present

## 2015-07-24 DIAGNOSIS — R7989 Other specified abnormal findings of blood chemistry: Secondary | ICD-10-CM

## 2015-07-24 DIAGNOSIS — E669 Obesity, unspecified: Secondary | ICD-10-CM | POA: Insufficient documentation

## 2015-07-24 DIAGNOSIS — R945 Abnormal results of liver function studies: Secondary | ICD-10-CM

## 2015-07-24 DIAGNOSIS — F250 Schizoaffective disorder, bipolar type: Secondary | ICD-10-CM

## 2015-07-24 DIAGNOSIS — F329 Major depressive disorder, single episode, unspecified: Secondary | ICD-10-CM

## 2015-07-24 DIAGNOSIS — I10 Essential (primary) hypertension: Secondary | ICD-10-CM | POA: Diagnosis not present

## 2015-07-24 DIAGNOSIS — Z0001 Encounter for general adult medical examination with abnormal findings: Secondary | ICD-10-CM | POA: Diagnosis not present

## 2015-07-24 DIAGNOSIS — F32A Depression, unspecified: Secondary | ICD-10-CM

## 2015-07-24 DIAGNOSIS — B1920 Unspecified viral hepatitis C without hepatic coma: Secondary | ICD-10-CM

## 2015-07-24 DIAGNOSIS — Z1331 Encounter for screening for depression: Secondary | ICD-10-CM

## 2015-07-24 DIAGNOSIS — R6889 Other general symptoms and signs: Secondary | ICD-10-CM | POA: Diagnosis not present

## 2015-07-24 DIAGNOSIS — Z1389 Encounter for screening for other disorder: Secondary | ICD-10-CM

## 2015-07-24 DIAGNOSIS — Z9181 History of falling: Secondary | ICD-10-CM

## 2015-07-24 DIAGNOSIS — E559 Vitamin D deficiency, unspecified: Secondary | ICD-10-CM

## 2015-07-24 DIAGNOSIS — E782 Mixed hyperlipidemia: Secondary | ICD-10-CM

## 2015-07-24 DIAGNOSIS — R0989 Other specified symptoms and signs involving the circulatory and respiratory systems: Secondary | ICD-10-CM

## 2015-07-24 DIAGNOSIS — Z79899 Other long term (current) drug therapy: Secondary | ICD-10-CM

## 2015-07-24 DIAGNOSIS — E039 Hypothyroidism, unspecified: Secondary | ICD-10-CM

## 2015-07-24 DIAGNOSIS — Z1212 Encounter for screening for malignant neoplasm of rectum: Secondary | ICD-10-CM

## 2015-07-24 LAB — HEPATIC FUNCTION PANEL
ALT: 63 U/L — AB (ref 6–29)
AST: 66 U/L — AB (ref 10–35)
Albumin: 3.7 g/dL (ref 3.6–5.1)
Alkaline Phosphatase: 99 U/L (ref 33–130)
BILIRUBIN DIRECT: 0.2 mg/dL (ref <=0.2)
BILIRUBIN INDIRECT: 0.4 mg/dL (ref 0.2–1.2)
TOTAL PROTEIN: 6.7 g/dL (ref 6.1–8.1)
Total Bilirubin: 0.6 mg/dL (ref 0.2–1.2)

## 2015-07-24 LAB — BASIC METABOLIC PANEL WITH GFR
BUN: 9 mg/dL (ref 7–25)
CALCIUM: 9.6 mg/dL (ref 8.6–10.4)
CHLORIDE: 107 mmol/L (ref 98–110)
CO2: 22 mmol/L (ref 20–31)
CREATININE: 0.83 mg/dL (ref 0.50–0.99)
GFR, Est African American: 89 mL/min (ref >=60)
GFR, Est Non African American: 77 mL/min (ref >=60)
GLUCOSE: 134 mg/dL — AB (ref 65–99)
Potassium: 4 mmol/L (ref 3.5–5.3)
SODIUM: 138 mmol/L (ref 135–146)

## 2015-07-24 LAB — LIPID PANEL
CHOL/HDL RATIO: 4.8 ratio (ref <=5.0)
CHOLESTEROL: 121 mg/dL — AB (ref 125–200)
HDL: 25 mg/dL — ABNORMAL LOW (ref >=46)
LDL Cholesterol: 58 mg/dL (ref <130)
Triglycerides: 191 mg/dL — ABNORMAL HIGH (ref <150)
VLDL: 38 mg/dL — AB (ref <30)

## 2015-07-24 LAB — CBC WITH DIFFERENTIAL/PLATELET
BASOS ABS: 0.1 10*3/uL (ref 0.0–0.1)
Basophils Relative: 1 % (ref 0–1)
EOS PCT: 4 % (ref 0–5)
Eosinophils Absolute: 0.3 10*3/uL (ref 0.0–0.7)
HEMATOCRIT: 38.4 % (ref 36.0–46.0)
HEMOGLOBIN: 12.9 g/dL (ref 12.0–15.0)
LYMPHS PCT: 22 % (ref 12–46)
Lymphs Abs: 1.6 10*3/uL (ref 0.7–4.0)
MCH: 29 pg (ref 26.0–34.0)
MCHC: 33.6 g/dL (ref 30.0–36.0)
MCV: 86.3 fL (ref 78.0–100.0)
MPV: 10.1 fL (ref 8.6–12.4)
Monocytes Absolute: 0.7 10*3/uL (ref 0.1–1.0)
Monocytes Relative: 9 % (ref 3–12)
NEUTROS ABS: 4.7 10*3/uL (ref 1.7–7.7)
Neutrophils Relative %: 64 % (ref 43–77)
Platelets: 235 10*3/uL (ref 150–400)
RBC: 4.45 MIL/uL (ref 3.87–5.11)
RDW: 14.1 % (ref 11.5–15.5)
WBC: 7.4 10*3/uL (ref 4.0–10.5)

## 2015-07-24 LAB — MAGNESIUM: MAGNESIUM: 1.7 mg/dL (ref 1.5–2.5)

## 2015-07-24 LAB — HEMOGLOBIN A1C
HEMOGLOBIN A1C: 5.3 % (ref <5.7)
MEAN PLASMA GLUCOSE: 105 mg/dL (ref <117)

## 2015-07-24 NOTE — Patient Instructions (Signed)
Recommend Adult Low dose Aspirin or   coated  Aspirin 81 mg daily   To reduce risk of Colon Cancer 20 %,   Skin Cancer 26 % ,   Melanoma 46%   and   Pancreatic cancer 60%  ++++++++++++++++++  Vitamin D goal   is between 70-100.   Please make sure that you are taking your Vitamin D as directed.   It is very important as a natural anti-inflammatory   helping hair, skin, and nails, as well as reducing stroke and heart attack risk.   It helps your bones and helps with mood.  It also decreases numerous cancer risks so please take it as directed.   Low Vit D is associated with a 200-300% higher risk for CANCER   and 200-300% higher risk for HEART   ATTACK  &  STROKE.   ......................................  It is also associated with higher death rate at younger ages,   autoimmune diseases like Rheumatoid arthritis, Lupus, Multiple Sclerosis.     Also many other serious conditions, like depression, Alzheimer's  Dementia, infertility, muscle aches, fatigue, fibromyalgia - just to name a few.  +++++++++++++++++++  Recommend the book "The END of DIETING" by Dr Joel Fuhrman   & the book "The END of DIABETES " by Dr Joel Fuhrman  At [employer] - get book & Audio CD's     Being diabetic has a  300% increased risk for heart attack, stroke, cancer, and alzheimer- type vascular dementia. It is very important that you work harder with diet by avoiding all foods that are white. Avoid white rice (brown & wild rice is OK), white potatoes (sweetpotatoes in moderation is OK), White bread or wheat bread or anything made out of white flour like bagels, donuts, rolls, buns, biscuits, cakes, pastries, cookies, pizza crust, and pasta (made from white flour & egg whites) - vegetarian pasta or spinach or wheat pasta is OK. Multigrain breads like Arnold's or [employer], or multigrain sandwich thins or flatbreads.  Diet, exercise and weight loss can reverse and cure diabetes in the early  stages.  Diet, exercise and weight loss is very important in the control and prevention of complications of diabetes which affects every system in your body, ie. Brain - dementia/stroke, eyes - glaucoma/blindness, heart - heart attack/heart failure, kidneys - dialysis, stomach - gastric paralysis, intestines - malabsorption, nerves - severe painful neuritis, circulation - gangrene & loss of a leg(s), and finally cancer and Alzheimers.    I recommend avoid fried & greasy foods,  sweets/candy, white rice (brown or wild rice or Quinoa is OK), white potatoes (sweet potatoes are OK) - anything made from white flour - bagels, doughnuts, rolls, buns, biscuits,white and wheat breads, pizza crust and traditional pasta made of white flour & egg white(vegetarian pasta or spinach or wheat pasta is OK).  Multi-grain bread is OK - like multi-grain flat bread or sandwich thins. Avoid alcohol in excess. Exercise is also important.    Eat all the vegetables you want - avoid meat, especially red meat and dairy - especially cheese.  Cheese is the most concentrated form of trans-fats which is the worst thing to clog up our arteries. Veggie cheese is OK which can be found in the fresh produce section at [employer] or [employer] or [employer]  ++++++++++++++++++++++++++   Preventive Care for Adults  A healthy lifestyle and preventive care can promote health and wellness. Preventive health guidelines for women include the following key practices.  A routine   yearly physical is a good way to check with your health care provider about your health and preventive screening. It is a chance to share any concerns and updates on your health and to receive a thorough exam.  Visit your dentist for a routine exam and preventive care every 6 months. Brush your teeth twice a day and floss once a day. Good oral hygiene prevents tooth decay and gum disease.  The frequency of eye exams is based on your age, health, family medical  history, use of contact lenses, and other factors. Follow your health care provider's recommendations for frequency of eye exams.  Eat a healthy diet. Foods like vegetables, fruits, whole grains, low-fat dairy products, and lean protein foods contain the nutrients you need without too many calories. Decrease your intake of foods high in solid fats, added sugars, and salt. Eat the right amount of calories for you.Get information about a proper diet from your health care provider, if necessary.  Regular physical exercise is one of the most important things you can do for your health. Most adults should get at least 150 minutes of moderate-intensity exercise (any activity that increases your heart rate and causes you to sweat) each week. In addition, most adults need muscle-strengthening exercises on 2 or more days a week.  Maintain a healthy weight. The body mass index (BMI) is a screening tool to identify possible weight problems. It provides an estimate of body fat based on height and weight. Your health care provider can find your BMI and can help you achieve or maintain a healthy weight.For adults 20 years and older:  A BMI below 18.5 is considered underweight.  A BMI of 18.5 to 24.9 is normal.  A BMI of 25 to 29.9 is considered overweight.  A BMI of 30 and above is considered obese.  Maintain normal blood lipids and cholesterol levels by exercising and minimizing your intake of saturated fat. Eat a balanced diet with plenty of fruit and vegetables. Blood tests for lipids and cholesterol should begin at age 87 and be repeated every 5 years. If your lipid or cholesterol levels are high, you are over 50, or you are at high risk for heart disease, you may need your cholesterol levels checked more frequently.Ongoing high lipid and cholesterol levels should be treated with medicines if diet and exercise are not working.  If you smoke, find out from your health care provider how to quit. If you do  not use tobacco, do not start.  Lung cancer screening is recommended for adults aged 93-80 years who are at high risk for developing lung cancer because of a history of smoking. A yearly low-dose CT scan of the lungs is recommended for people who have at least a 30-pack-year history of smoking and are a current smoker or have quit within the past 15 years. A pack year of smoking is smoking an average of 1 pack of cigarettes a day for 1 year (for example: 1 pack a day for 30 years or 2 packs a day for 15 years). Yearly screening should continue until the smoker has stopped smoking for at least 15 years. Yearly screening should be stopped for people who develop a health problem that would prevent them from having lung cancer treatment.  High blood pressure causes heart disease and increases the risk of stroke. Your blood pressure should be checked at least every 1 to 2 years. Ongoing high blood pressure should be treated with medicines if weight loss and exercise  do not work.  If you are 34-80 years old, ask your health care provider if you should take aspirin to prevent strokes.  Diabetes screening involves taking a blood sample to check your fasting blood sugar level. This should be done once every 3 years, after age 24, if you are within normal weight and without risk factors for diabetes. Testing should be considered at a younger age or be carried out more frequently if you are overweight and have at least 1 risk factor for diabetes.  Breast cancer screening is essential preventive care for women. You should practice "breast self-awareness." This means understanding the normal appearance and feel of your breasts and may include breast self-examination. Any changes detected, no matter how small, should be reported to a health care provider. Women in their 62s and 30s should have a clinical breast exam (CBE) by a health care provider as part of a regular health exam every 1 to 3 years. After age 1, women  should have a CBE every year. Starting at age 24, women should consider having a mammogram (breast X-ray test) every year. Women who have a family history of breast cancer should talk to their health care provider about genetic screening. Women at a high risk of breast cancer should talk to their health care providers about having an MRI and a mammogram every year.  Breast cancer gene (BRCA)-related cancer risk assessment is recommended for women who have family members with BRCA-related cancers. BRCA-related cancers include breast, ovarian, tubal, and peritoneal cancers. Having family members with these cancers may be associated with an increased risk for harmful changes (mutations) in the breast cancer genes BRCA1 and BRCA2. Results of the assessment will determine the need for genetic counseling and BRCA1 and BRCA2 testing.  Routine pelvic exams to screen for cancer are no longer recommended for nonpregnant women who are considered low risk for cancer of the pelvic organs (ovaries, uterus, and vagina) and who do not have symptoms. Ask your health care provider if a screening pelvic exam is right for you.  If you have had past treatment for cervical cancer or a condition that could lead to cancer, you need Pap tests and screening for cancer for at least 20 years after your treatment. If Pap tests have been discontinued, your risk factors (such as having a new sexual partner) need to be reassessed to determine if screening should be resumed. Some women have medical problems that increase the chance of getting cervical cancer. In these cases, your health care provider may recommend more frequent screening and Pap tests.  Colorectal cancer can be detected and often prevented. Most routine colorectal cancer screening begins at the age of 9 years and continues through age 58 years. However, your health care provider may recommend screening at an earlier age if you have risk factors for colon cancer. On a  yearly basis, your health care provider may provide home test kits to check for hidden blood in the stool. Use of a small camera at the end of a tube, to directly examine the colon (sigmoidoscopy or colonoscopy), can detect the earliest forms of colorectal cancer. Talk to your health care provider about this at age 44, when routine screening begins. Direct exam of the colon should be repeated every 5-10 years through age 57 years, unless early forms of pre-cancerous polyps or small growths are found.  Hepatitis C blood testing is recommended for all people born from [birth year] through 1965 and any individual with known risks for  hepatitis C.  Pra  Osteoporosis is a disease in which the bones lose minerals and strength with aging. This can result in serious bone fractures or breaks. The risk of osteoporosis can be identified using a bone density scan. Women ages 65 years and over and women at risk for fractures or osteoporosis should discuss screening with their health care providers. Ask your health care provider whether you should take a calcium supplement or vitamin D to reduce the rate of osteoporosis.  Menopause can be associated with physical symptoms and risks. Hormone replacement therapy is available to decrease symptoms and risks. You should talk to your health care provider about whether hormone replacement therapy is right for you.  Use sunscreen. Apply sunscreen liberally and repeatedly throughout the day. You should seek shade when your shadow is shorter than you. Protect yourself by wearing long sleeves, pants, a wide-brimmed hat, and sunglasses year round, whenever you are outdoors.  Once a month, do a whole body skin exam, using a mirror to look at the skin on your back. Tell your health care provider of new moles, moles that have irregular borders, moles that are larger than a pencil eraser, or moles that have changed in shape or color.  Stay current with required vaccines  (immunizations).  Influenza vaccine. All adults should be immunized every year.  Tetanus, diphtheria, and acellular pertussis (Td, Tdap) vaccine. Pregnant women should receive 1 dose of Tdap vaccine during each pregnancy. The dose should be obtained regardless of the length of time since the last dose. Immunization is preferred during the 27th-36th week of gestation. An adult who has not previously received Tdap or who does not know her vaccine status should receive 1 dose of Tdap. This initial dose should be followed by tetanus and diphtheria toxoids (Td) booster doses every 10 years. Adults with an unknown or incomplete history of completing a 3-dose immunization series with Td-containing vaccines should begin or complete a primary immunization series including a Tdap dose. Adults should receive a Td booster every 10 years.  Varicella vaccine. An adult without evidence of immunity to varicella should receive 2 doses or a second dose if she has previously received 1 dose. Pregnant females who do not have evidence of immunity should receive the first dose after pregnancy. This first dose should be obtained before leaving the health care facility. The second dose should be obtained 4-8 weeks after the first dose.  Human papillomavirus (HPV) vaccine. Females aged 13-26 years who have not received the vaccine previously should obtain the 3-dose series. The vaccine is not recommended for use in pregnant females. However, pregnancy testing is not needed before receiving a dose. If a female is found to be pregnant after receiving a dose, no treatment is needed. In that case, the remaining doses should be delayed until after the pregnancy. Immunization is recommended for any person with an immunocompromised condition through the age of 26 years if she did not get any or all doses earlier. During the 3-dose series, the second dose should be obtained 4-8 weeks after the first dose. The third dose should be obtained  24 weeks after the first dose and 16 weeks after the second dose.  Zoster vaccine. One dose is recommended for adults aged 60 years or older unless certain conditions are present.  Measles, mumps, and rubella (MMR) vaccine. Adults born before 1957 generally are considered immune to measles and mumps. Adults born in 1957 or later should have 1 or more doses of MMR   vaccine unless there is a contraindication to the vaccine or there is laboratory evidence of immunity to each of the three diseases. A routine second dose of MMR vaccine should be obtained at least 28 days after the first dose for students attending postsecondary schools, health care workers, or international travelers. People who received inactivated measles vaccine or an unknown type of measles vaccine during 1963-1967 should receive 2 doses of MMR vaccine. People who received inactivated mumps vaccine or an unknown type of mumps vaccine before 1979 and are at high risk for mumps infection should consider immunization with 2 doses of MMR vaccine. For females of childbearing age, rubella immunity should be determined. If there is no evidence of immunity, females who are not pregnant should be vaccinated. If there is no evidence of immunity, females who are pregnant should delay immunization until after pregnancy. Unvaccinated health care workers born before [birth year] who lack laboratory evidence of measles, mumps, or rubella immunity or laboratory confirmation of disease should consider measles and mumps immunization with 2 doses of MMR vaccine or rubella immunization with 1 dose of MMR vaccine.  Pneumococcal 13-valent conjugate (PCV13) vaccine. When indicated, a person who is uncertain of her immunization history and has no record of immunization should receive the PCV13 vaccine. An adult aged 72 years or older who has certain medical conditions and has not been previously immunized should receive 1 dose of PCV13 vaccine. This PCV13 should be followed  with a dose of pneumococcal polysaccharide (PPSV23) vaccine. The PPSV23 vaccine dose should be obtained at least 8 weeks after the dose of PCV13 vaccine. An adult aged 28 years or older who has certain medical conditions and previously received 1 or more doses of PPSV23 vaccine should receive 1 dose of PCV13. The PCV13 vaccine dose should be obtained 1 or more years after the last PPSV23 vaccine dose.    Pneumococcal polysaccharide (PPSV23) vaccine. When PCV13 is also indicated, PCV13 should be obtained first. All adults aged 58 years and older should be immunized. An adult younger than age 76 years who has certain medical conditions should be immunized. Any person who resides in a nursing home or long-term care facility should be immunized. An adult smoker should be immunized. People with an immunocompromised condition and certain other conditions should receive both PCV13 and PPSV23 vaccines. People with human immunodeficiency virus (HIV) infection should be immunized as soon as possible after diagnosis. Immunization during chemotherapy or radiation therapy should be avoided. Routine use of PPSV23 vaccine is not recommended for American Indians, [place], or people younger than 65 years unless there are medical conditions that require PPSV23 vaccine. When indicated, people who have unknown immunization and have no record of immunization should receive PPSV23 vaccine. One-time revaccination 5 years after the first dose of PPSV23 is recommended for people aged 19-64 years who have chronic kidney failure, nephrotic syndrome, asplenia, or immunocompromised conditions. People who received 1-2 doses of PPSV23 before age 30 years should receive another dose of PPSV23 vaccine at age 74 years or later if at least 5 years have passed since the previous dose. Doses of PPSV23 are not needed for people immunized with PPSV23 at or after age 20 years.  Preventive Services / Frequency   Ages 92 to 36 years  Blood  pressure check.  Lipid and cholesterol check.  Lung cancer screening. / Every year if you are aged 45-80 years and have a 30-pack-year history of smoking and currently smoke or have quit within the past 15  years. Yearly screening is stopped once you have quit smoking for at least 15 years or develop a health problem that would prevent you from having lung cancer treatment.  Clinical breast exam.** / Every year after age 21 years.  BRCA-related cancer risk assessment.** / For women who have family members with a BRCA-related cancer (breast, ovarian, tubal, or peritoneal cancers).  Mammogram.** / Every year beginning at age 69 years and continuing for as long as you are in good health. Consult with your health care provider.  Pap test.** / Every 3 years starting at age 31 years through age 25 or 92 years with a history of 3 consecutive normal Pap tests.  HPV screening.** / Every 3 years from ages 31 years through ages 82 to 58 years with a history of 3 consecutive normal Pap tests.  Fecal occult blood test (FOBT) of stool. / Every year beginning at age 69 years and continuing until age 32 years. You may not need to do this test if you get a colonoscopy every 10 years.  Flexible sigmoidoscopy or colonoscopy.** / Every 5 years for a flexible sigmoidoscopy or every 10 years for a colonoscopy beginning at age 17 years and continuing until age 59 years.  Hepatitis C blood test.** / For all people born from [birth year] through 1966 and any individual with known risks for hepatitis C.  Skin self-exam. / Monthly.  Influenza vaccine. / Every year.  Tetanus, diphtheria, and acellular pertussis (Tdap/Td) vaccine.** / Consult your health care provider. Pregnant women should receive 1 dose of Tdap vaccine during each pregnancy. 1 dose of Td every 10 years.  Varicella vaccine.** / Consult your health care provider. Pregnant females who do not have evidence of immunity should receive the first dose after  pregnancy.  Zoster vaccine.** / 1 dose for adults aged 66 years or older.  Pneumococcal 13-valent conjugate (PCV13) vaccine.** / Consult your health care provider.  Pneumococcal polysaccharide (PPSV23) vaccine.** / 1 to 2 doses if you smoke cigarettes or if you have certain conditions.  Meningococcal vaccine.** / Consult your health care provider.  Hepatitis A vaccine.** / Consult your health care provider.  Hepatitis B vaccine.** / Consult your health care provider. Screening for abdominal aortic aneurysm (AAA)  by ultrasound is recommended for people over 50 who have history of high blood pressure or who are current or former smokers.

## 2015-07-25 ENCOUNTER — Other Ambulatory Visit: Payer: Self-pay | Admitting: Internal Medicine

## 2015-07-25 DIAGNOSIS — N3090 Cystitis, unspecified without hematuria: Secondary | ICD-10-CM

## 2015-07-25 LAB — VITAMIN D 25 HYDROXY (VIT D DEFICIENCY, FRACTURES): Vit D, 25-Hydroxy: 31 ng/mL (ref 30–100)

## 2015-07-25 LAB — URINALYSIS, MICROSCOPIC ONLY
Bacteria, UA: NONE SEEN [HPF]
CRYSTALS: NONE SEEN [HPF]
Casts: NONE SEEN [LPF]
Yeast: NONE SEEN [HPF]

## 2015-07-25 LAB — URINALYSIS, ROUTINE W REFLEX MICROSCOPIC
Bilirubin Urine: NEGATIVE
Glucose, UA: NEGATIVE
Hgb urine dipstick: NEGATIVE
Ketones, ur: NEGATIVE
NITRITE: NEGATIVE
Protein, ur: NEGATIVE
SPECIFIC GRAVITY, URINE: 1.011 (ref 1.001–1.035)
pH: 6.5 (ref 5.0–8.0)

## 2015-07-25 LAB — MICROALBUMIN / CREATININE URINE RATIO
Creatinine, Urine: 100 mg/dL
Microalb Creat Ratio: 12 mg/g (ref 0.0–30.0)
Microalb, Ur: 1.2 mg/dL (ref <2.0)

## 2015-07-25 LAB — LITHIUM LEVEL: LITHIUM LVL: 0.6 meq/L — AB (ref 0.80–1.40)

## 2015-07-25 LAB — TSH: TSH: 0.589 u[IU]/mL (ref 0.350–4.500)

## 2015-07-25 LAB — INSULIN, RANDOM: Insulin: 95.8 u[IU]/mL — ABNORMAL HIGH (ref 2.0–19.6)

## 2015-07-25 MED ORDER — CIPROFLOXACIN HCL 250 MG PO TABS: 250.0000 mg | ORAL_TABLET | Freq: Two times a day (BID) | ORAL | Status: DC

## 2015-07-26 DIAGNOSIS — F331 Major depressive disorder, recurrent, moderate: Secondary | ICD-10-CM | POA: Diagnosis not present

## 2015-07-29 ENCOUNTER — Encounter: Payer: Self-pay | Admitting: Internal Medicine

## 2015-07-29 NOTE — Progress Notes (Signed)
Patient ID: TYLIN STRADLEY, female   DOB: 1956-10-13, 59 y.o.   MRN: [id number]  Medicare  Annual  Wellness Visit And Comprehensive Evaluation, Examination and Management  Assessment:   1. Encounter for general adult medical examination with abnormal findings   2. Labile hypertension  - Microalbumin / creatinine urine ratio - EKG 12-Lead - TSH  3. Mixed hyperlipidemia  - Lipid panel  4. Prediabetes  - Hemoglobin A1c - Insulin, random  5. Vitamin D deficiency  - Vit D  25 hydroxy (rtn osteoporosis monitoring)  6. Depression screen   7. Depression, controlled   8. Hypothyroidism, unspecified hypothyroidism type  - TSH  9. Schizoaffective disorder, bipolar type (HCC)  - Lithium level  10. Screening for rectal cancer  - POC Hemoccult Bld/Stl (3-Cd Home Screen); Future  11. At low risk for fall   12. BMI 31.84,adult   13. Morbid obesity, unspecified obesity type ([place])   14. Abnormal LFTs   15. Hepatitis C virus infection without hepatic coma, unspecified chronicity  - will obtain Hep C virology & titers   16. Medication management  - Urinalysis, Routine w reflex microscopic (not at [facility]) - CBC with Differential/Platelet - BASIC METABOLIC PANEL WITH GFR - Hepatic function panel - Magnesium - Lithium level  Plan:   During the course of the visit the patient was educated and counseled about appropriate screening and preventive services including:    Pneumococcal vaccine   Influenza vaccine  Td vaccine  Screening electrocardiogram  Bone densitometry screening  Colorectal cancer screening  Diabetes screening  Glaucoma screening  Nutrition counseling   Advanced directives: requested  Screening recommendations, referrals: Vaccinations:  Immunization History  Administered Date(s) Administered  . Pneumococcal-Unspecified 10/14/2001  . Td 10/15/1995   Tdap vaccine declined Influenza vaccine declined Prevnar vaccine  declined Shingles vaccine declined  Nutrition assessed and recommended  Colonoscopy 03/2001 by Dr Deatra Ina - currently declines Recommended yearly ophthalmology/optometry visit for glaucoma screening and checkup Recommended yearly dental visit for hygiene and checkup Advanced directives - No - given forms  Conditions/risks identified: BMI: Discussed weight loss, diet, and increase physical activity.  Increase physical activity: AHA recommends 150 minutes of physical activity a week.  Medications reviewed PreDiabetes is not at goal, ACE/ARB therapy: Not Indicated yet  Urinary Incontinence is not an issue: discussed non pharmacology and pharmacology options.  Fall risk: low- discussed PT, home fall assessment, medications.   Subjective:      RADHA COGGINS  presents for [facility] Annual Wellness Visit and presents for a comprehensive evaluation, examination and management of multiple medical co-morbidities..  No prior medicare wellness visit is known.  This very nice 59 y.o. DWF also presents for follow up with Hypertension, Hyperlipidemia, Pre-Diabetes, Hypothyroidism  and Vitamin D Deficiency.      In review of hospital psychiatric records it was discovered that she was dx'd with Hepatitis C, but there was no mention further of w/u or treatment.      In the spring of 2013 she had a psychotic break and presented with classic Sn's & Sx's of Schizophrenia and had several Psych admissions and she was also Dx'd Bipolar Manic Depressive. Currently she is followed by [facility] by a staff therapist & Psychiatrist to manage her psychotropic medications.      Patient has hx/o labile HTN circa 2005 and has been monitored expectantly & BP has been controlled and today's BP: 120/80 mmHg. Patient has had no complaints of any  cardiac type chest pain, palpitations, dyspnea/orthopnea/PND, dizziness, claudication, or dependent edema.     Hyperlipidemia is controlled with diet & meds.  Patient denies myalgias or other med SE's. Last Lipids were at goal with Cholesterol 121; HDL 25; LDL 58; and sl elevated Triglycerides 191 on 07/24/2015.     Also, the patient has history of PreDiabetes with Insulin Resistance with A1c 5.5% and elevated Insulin level 35 in Jan 2014 and has had no symptoms of reactive hypoglycemia, diabetic polys, paresthesias or visual blurring.  Today's A1c was 5.3%, but insulin level was elevated at 95.8 still consistent with Insulin Resistance of prediabetes on 07/24/2015.      Patient has been on thyroid replacement circa 2005 and remained in therapeutic range on routine monitoring. Further, the patient also has history of Vitamin D Deficiency of 62 in June 2015 and she is not currently on Vit d supplements. Last vitamin D was still very low at  31 on 07/24/2015.  Names of Other Physician/Practitioners you currently use: 1. [facility] here for primary care 2. Dr Sherlean Foot, [place], eye doctor, last visit 2015  3. Dr Clarene Essex, dentist, last visit 2015  Patient Care Team: Unk Pinto, MD as PCP - General (Internal Medicine) [place], DPM as Consulting Physician (Podiatry) Inda Castle, MD as Consulting Physician (Gastroenterology) Thalia Bloodgood, OD as Referring Physician (Optometry)  Medication Review: Medication Sig  . levothyroxine  75 MCG tablet Take 1 tablet (75 mcg total) by mouth daily.  Marland Kitchen lithium 300 MG tablet Take 600 mg by mouth at bedtime.  Marland Kitchen thiothixene (NAVANE) 5 MG capsule Take 5 mg by mouth 2 (two) times daily.  . trihexyphenidyl (ARTANE) 5 MG tablet Take 5 mg by mouth at bedtime.   Allergies  Allergen Reactions  . Sulfa Antibiotics Rash    Current Problems (verified) Patient Active Problem List   Diagnosis Date Noted  . BMI 31.84,adult 07/24/2015  . Morbid obesity ([place])  (BMI 31.84) 07/24/2015  . Hypothyroidism 12/13/2014  . Mixed hyperlipidemia 09/01/2014  . Vitamin D deficiency  09/01/2014  . Bipolar affective disorder, depressed, severe, with psychotic behavior ([place]) 09/01/2014  . Medication management 03/30/2014  . Other malaise and fatigue 03/30/2014  . Anemia, unspecified 03/30/2014  . Anxiety   . Depression, controlled   . Prediabetes   . Labile hypertension   . Schizoaffective disorder, bipolar type ([place]) 09/12/2011    Screening Tests Health Maintenance  Topic Date Due  . HIV Screening  05/21/1970  . PAP SMEAR  05/21/1976  . MAMMOGRAM  05/21/2005  . COLONOSCOPY  05/21/2005  . TETANUS/TDAP  10/14/2005  . INFLUENZA VACCINE  05/15/2015  . ZOSTAVAX  05/22/2015  . Hepatitis C Screening  Completed    Immunization History  Administered Date(s) Administered  . Pneumococcal-Unspecified 10/14/2001  . Td 10/15/1995    Preventative care: Last colonoscopy: 03/2001 by Dr Deatra Ina  Past Medical History  Diagnosis Date  . Mental disorder   . Hepatitis   . Anxiety   . Depression   . Thyroid disease   . Hypothyroidism   . Prediabetes   . Labile hypertension    Past Surgical History  Procedure Laterality Date  . Arm fracture    . Rotator cuff repair      Risk Factors: Tobacco Social History  Substance Use Topics  . Smoking status: Never Smoker   . Smokeless tobacco: Never Used  . Alcohol Use: No   She does not smoke.  Patient  is not a former smoker. Are there smokers in your home (other than you)?  No Alcohol Current alcohol use: none  Caffeine Current caffeine use: coffee 0-1 cups /day  Exercise Current exercise: walking  Nutrition/Diet Current diet: in general, a "healthy" diet    Cardiac risk factors: dyslipidemia, obesity (BMI >= 30 kg/m2) and sedentary lifestyle.  Depression Screen (Note: if answer to either of the following is "Yes", a more complete depression screening is indicated)   Q1: Over the past two weeks, have you felt down, depressed or hopeless? No  Q2: Over the past two weeks, have you felt little interest or  pleasure in doing things? No  Have you lost interest or pleasure in daily life? No  Do you often feel hopeless? No  Do you cry easily over simple problems? No  Activities of Daily Living In your present state of health, do you have any difficulty performing the following activities?:  Driving? No Managing money?  No Feeding yourself? No Getting from bed to chair? No Climbing a flight of stairs? No Preparing food and eating?: No Bathing or showering? No Getting dressed: No Getting to the toilet? No Using the toilet:No Moving around from place to place: No In the past year have you fallen or had a near fall?:No   Are you sexually active?  Yes  Do you have more than one partner?  No  Vision Difficulties: No  Hearing Difficulties: No Do you often ask people to speak up or repeat themselves? No Do you experience ringing or noises in your ears? No Do you have difficulty understanding soft or whispered voices? Sometimes.  Cognition  Do you feel that you have a problem with memory?No  Do you often misplace items? No  Do you feel safe at home?  Yes  Advanced directives Does patient have a [place]? No - given forms Does patient have a Living Will? No - given forms  ROS: Constitutional: Denies fever, chills, weight loss/gain, headaches, insomnia, fatigue, night sweats, and change in appetite. Eyes: Denies redness, blurred vision, diplopia, discharge, itchy, watery eyes.  ENT: Denies discharge, congestion, post nasal drip, epistaxis, sore throat, earache, hearing loss, dental pain, Tinnitus, Vertigo, Sinus pain, snoring.  Cardio: Denies chest pain, palpitations, irregular heartbeat, syncope, dyspnea, diaphoresis, orthopnea, PND, claudication, edema Respiratory: denies cough, dyspnea, DOE, pleurisy, hoarseness, laryngitis, wheezing.  Gastrointestinal: Denies dysphagia, heartburn, reflux, water brash, pain, cramps, nausea, vomiting, bloating, diarrhea,  constipation, hematemesis, melena, hematochezia, jaundice, hemorrhoids Genitourinary: Denies dysuria, frequency, urgency, nocturia, hesitancy, discharge, hematuria, flank pain Breast: Breast lumps, nipple discharge, bleeding.  Musculoskeletal: Denies arthralgia, myalgia, stiffness, Jt. Swelling, pain, limp, and strain/sprain. Denies falls. Skin: Denies puritis, rash, hives, warts, acne, eczema, changing in skin lesion Neuro: No weakness, tremor, incoordination, spasms, paresthesia, pain Psychiatric: Denies confusion, memory loss, sensory loss. Denies Depression. Endocrine: Denies change in weight, skin, hair change, nocturia, and paresthesia, diabetic polys, visual blurring, hyper / hypo glycemic episodes.  Heme/Lymph: No excessive bleeding, bruising, enlarged lymph nodes  Objective:     BP 120/80 mmHg  Pulse 80  Temp(Src) 97.6 F (36.4 C)  Resp 16  Ht 5\' 4"  (1.626 m)  Wt 185 lb 9.6 oz (84.188 kg)  BMI 31.84 kg/m2  General Appearance: Well nourished, alert, WD/WN, female and in no apparent distress. Eyes: PERRLA, EOMs, conjunctiva no swelling or erythema, normal fundi and vessels. Sinuses: No frontal/maxillary tenderness ENT/Mouth: EACs patent / TMs  nl. Nares clear without erythema, swelling, mucoid exudates. Oral  hygiene is good. No erythema, swelling, or exudate. Tongue normal, non-obstructing. Tonsils not swollen or erythematous. Hearing normal.  Neck: Supple, thyroid normal. No bruits, nodes or JVD. Respiratory: Respiratory effort normal.  BS equal and clear bilateral without rales, rhonci, wheezing or stridor. Cardio: Heart sounds are normal with regular rate and rhythm and no murmurs, rubs or gallops. Peripheral pulses are normal and equal bilaterally without edema. No aortic or femoral bruits. Chest: symmetric with normal excursions and percussion. Breasts: Symmetric, without lumps, nipple discharge, retractions, or fibrocystic changes.  Abdomen: Flat, soft  with nl bowel  sounds. Nontender, no guarding, rebound, hernias, masses, or organomegaly.  Lymphatics: Non tender without lymphadenopathy.  Genitourinary:  Musculoskeletal: Full ROM all peripheral extremities, joint stability, 5/5 strength, and normal gait. Skin: Warm and dry without rashes, lesions, cyanosis, clubbing or  ecchymosis.  Neuro: Cranial nerves intact, reflexes equal bilaterally. Normal muscle tone, no cerebellar symptoms. Sensation intact.  Pysch: Alert and oriented X 3, normal affect, Insight and Judgment appropriate.   Cognitive Testing  Alert? Yes  Normal Appearance?Yes  Oriented to person? Yes  Place? Yes   Time? Yes  Recall of three objects?  Yes  Can perform simple calculations? Yes  Displays appropriate judgment? Yes  Can read the correct time from a watch/clock?Yes  Medicare Attestation I have personally reviewed: The patient's medical and social history Their use of alcohol, tobacco or illicit drugs Their current medications and supplements The patient's functional ability including ADLs,fall risks, home safety risks, cognitive, and hearing and visual impairment Diet and physical activities Evidence for depression or mood disorders  The patient's weight, height, BMI, and visual acuity have been recorded in the chart.  I have made referrals, counseling, and provided education to the patient based on review of the above and I have provided the patient with a written personalized care plan for preventive services.  Over 40 minutes of exam, counseling, chart review was performed.  Jomarion Mish DAVID, MD   07/29/2015

## 2015-08-08 ENCOUNTER — Other Ambulatory Visit: Payer: PPO

## 2015-08-08 DIAGNOSIS — R7989 Other specified abnormal findings of blood chemistry: Secondary | ICD-10-CM

## 2015-08-08 DIAGNOSIS — R945 Abnormal results of liver function studies: Principal | ICD-10-CM

## 2015-08-08 LAB — HEPATITIS C ANTIBODY: HCV AB: REACTIVE — AB

## 2015-08-08 LAB — HEPATITIS A ANTIBODY, TOTAL: HEP A TOTAL AB: REACTIVE — AB

## 2015-08-08 LAB — HEPATITIS B CORE ANTIBODY, TOTAL: HEP B C TOTAL AB: REACTIVE — AB

## 2015-08-08 LAB — HEPATITIS B SURFACE ANTIBODY,QUALITATIVE: Hep B S Ab: POSITIVE — AB

## 2015-08-09 LAB — HCV RNA QUANT RFLX ULTRA OR GENOTYP
HCV QUANT LOG: 6.85 {Log} — AB (ref <1.18)
HCV QUANT: 7063853 [IU]/mL — AB (ref <15)

## 2015-08-09 LAB — HEPATITIS C RNA QUANTITATIVE
HCV QUANT: 7063853 [IU]/mL — AB (ref <15)
HCV Quantitative Log: 6.85 {Log} — ABNORMAL HIGH (ref <1.18)

## 2015-08-10 LAB — HEPATITIS B E ANTIBODY: Hepatitis Be Antibody: NONREACTIVE

## 2015-08-11 LAB — HEPATITIS C GENOTYPE

## 2015-08-14 ENCOUNTER — Other Ambulatory Visit: Payer: Self-pay | Admitting: *Deleted

## 2015-08-14 DIAGNOSIS — Z1212 Encounter for screening for malignant neoplasm of rectum: Secondary | ICD-10-CM

## 2015-08-14 LAB — POC HEMOCCULT BLD/STL (HOME/3-CARD/SCREEN)
Card #2 Fecal Occult Blod, POC: NEGATIVE
Card #3 Fecal Occult Blood, POC: NEGATIVE
FECAL OCCULT BLD: NEGATIVE

## 2015-09-13 ENCOUNTER — Other Ambulatory Visit (HOSPITAL_COMMUNITY): Payer: Self-pay | Admitting: Nurse Practitioner

## 2015-09-14 ENCOUNTER — Other Ambulatory Visit (HOSPITAL_COMMUNITY): Payer: Self-pay | Admitting: Nurse Practitioner

## 2015-09-14 DIAGNOSIS — B182 Chronic viral hepatitis C: Secondary | ICD-10-CM

## 2015-10-11 ENCOUNTER — Ambulatory Visit (HOSPITAL_COMMUNITY)
Admission: RE | Admit: 2015-10-11 | Discharge: 2015-10-11 | Disposition: A | Discharge status: Home / Self Care | Payer: PPO | Source: Ambulatory Visit | Attending: Nurse Practitioner | Admitting: Nurse Practitioner

## 2015-10-11 DIAGNOSIS — K760 Fatty (change of) liver, not elsewhere classified: Secondary | ICD-10-CM | POA: Diagnosis not present

## 2015-10-11 DIAGNOSIS — B182 Chronic viral hepatitis C: Secondary | ICD-10-CM | POA: Diagnosis present

## 2015-10-11 DIAGNOSIS — B1920 Unspecified viral hepatitis C without hepatic coma: Secondary | ICD-10-CM | POA: Diagnosis not present

## 2015-10-19 DIAGNOSIS — F331 Major depressive disorder, recurrent, moderate: Secondary | ICD-10-CM | POA: Diagnosis not present

## 2015-10-26 ENCOUNTER — Encounter: Payer: Self-pay | Admitting: Internal Medicine

## 2015-10-26 ENCOUNTER — Ambulatory Visit (INDEPENDENT_AMBULATORY_CARE_PROVIDER_SITE_OTHER): Payer: PPO | Admitting: Internal Medicine

## 2015-10-26 VITALS — BP 110/78 | HR 80 | Temp 97.8°F | Resp 16 | Ht 64.0 in | Wt 184.0 lb

## 2015-10-26 DIAGNOSIS — E782 Mixed hyperlipidemia: Secondary | ICD-10-CM | POA: Diagnosis not present

## 2015-10-26 DIAGNOSIS — E559 Vitamin D deficiency, unspecified: Secondary | ICD-10-CM

## 2015-10-26 DIAGNOSIS — I10 Essential (primary) hypertension: Secondary | ICD-10-CM

## 2015-10-26 DIAGNOSIS — Z79899 Other long term (current) drug therapy: Secondary | ICD-10-CM | POA: Diagnosis not present

## 2015-10-26 DIAGNOSIS — F315 Bipolar disorder, current episode depressed, severe, with psychotic features: Secondary | ICD-10-CM | POA: Diagnosis not present

## 2015-10-26 DIAGNOSIS — E039 Hypothyroidism, unspecified: Secondary | ICD-10-CM | POA: Diagnosis not present

## 2015-10-26 DIAGNOSIS — R7303 Prediabetes: Secondary | ICD-10-CM | POA: Diagnosis not present

## 2015-10-26 DIAGNOSIS — R7309 Other abnormal glucose: Secondary | ICD-10-CM | POA: Diagnosis not present

## 2015-10-26 DIAGNOSIS — R0989 Other specified symptoms and signs involving the circulatory and respiratory systems: Secondary | ICD-10-CM

## 2015-10-26 LAB — BASIC METABOLIC PANEL WITH GFR
BUN: 10 mg/dL (ref 7–25)
CO2: 25 mmol/L (ref 20–31)
CREATININE: 0.88 mg/dL (ref 0.50–0.99)
Calcium: 9.4 mg/dL (ref 8.6–10.4)
Chloride: 106 mmol/L (ref 98–110)
GFR, EST AFRICAN AMERICAN: 83 mL/min (ref >=60)
GFR, Est Non African American: 72 mL/min (ref >=60)
GLUCOSE: 90 mg/dL (ref 65–99)
Potassium: 4.4 mmol/L (ref 3.5–5.3)
Sodium: 139 mmol/L (ref 135–146)

## 2015-10-26 LAB — LIPID PANEL
Cholesterol: 125 mg/dL (ref 125–200)
HDL: 28 mg/dL — ABNORMAL LOW (ref >=46)
LDL CALC: 64 mg/dL (ref <130)
TRIGLYCERIDES: 164 mg/dL — AB (ref <150)
Total CHOL/HDL Ratio: 4.5 Ratio (ref <=5.0)
VLDL: 33 mg/dL — ABNORMAL HIGH (ref <30)

## 2015-10-26 LAB — CBC WITH DIFFERENTIAL/PLATELET
BASOS ABS: 0.1 10*3/uL (ref 0.0–0.1)
Basophils Relative: 1 % (ref 0–1)
EOS ABS: 0.4 10*3/uL (ref 0.0–0.7)
EOS PCT: 5 % (ref 0–5)
HCT: 40.9 % (ref 36.0–46.0)
Hemoglobin: 13.2 g/dL (ref 12.0–15.0)
LYMPHS ABS: 1.6 10*3/uL (ref 0.7–4.0)
Lymphocytes Relative: 22 % (ref 12–46)
MCH: 28.7 pg (ref 26.0–34.0)
MCHC: 32.3 g/dL (ref 30.0–36.0)
MCV: 88.9 fL (ref 78.0–100.0)
MONO ABS: 0.7 10*3/uL (ref 0.1–1.0)
MONOS PCT: 10 % (ref 3–12)
MPV: 9.8 fL (ref 8.6–12.4)
Neutro Abs: 4.5 10*3/uL (ref 1.7–7.7)
Neutrophils Relative %: 62 % (ref 43–77)
PLATELETS: 242 10*3/uL (ref 150–400)
RBC: 4.6 MIL/uL (ref 3.87–5.11)
RDW: 13.9 % (ref 11.5–15.5)
WBC: 7.2 10*3/uL (ref 4.0–10.5)

## 2015-10-26 LAB — HEPATIC FUNCTION PANEL
ALBUMIN: 3.8 g/dL (ref 3.6–5.1)
ALT: 53 U/L — ABNORMAL HIGH (ref 6–29)
AST: 64 U/L — ABNORMAL HIGH (ref 10–35)
Alkaline Phosphatase: 76 U/L (ref 33–130)
BILIRUBIN TOTAL: 0.7 mg/dL (ref 0.2–1.2)
Bilirubin, Direct: 0.2 mg/dL (ref <=0.2)
Indirect Bilirubin: 0.5 mg/dL (ref 0.2–1.2)
TOTAL PROTEIN: 7.1 g/dL (ref 6.1–8.1)

## 2015-10-26 LAB — TSH: TSH: 1.551 u[IU]/mL (ref 0.350–4.500)

## 2015-10-26 NOTE — Patient Instructions (Signed)
Please try taking one of the following claritin, zyrtec, or allegra.  Please take immodium if the loose stools become bothersome.    Please try drinking more water and avoid grape soda.  Eat plenty of fruits and veggies.

## 2015-10-26 NOTE — Progress Notes (Signed)
Patient ID: Rebekah Pace, female   DOB: Sep 04, 1956, 60 y.o.   MRN: [id number]  Assessment and Plan:  Hypertension:  -Continue medication,  -monitor blood pressure at home.  -Continue DASH diet.   -Reminder to go to the ER if any CP, SOB, nausea, dizziness, severe HA, changes vision/speech, left arm numbness and tingling, and jaw pain.  Cholesterol: -Continue diet and exercise.  -Check cholesterol.   Pre-diabetes: -Continue diet and exercise.  -Check A1C  Vitamin D Def: -check level -continue medications.   Chronic Hepatitis C -ultrasound shows fibrosis of the liver and some steatosis. -followup with ID -will likely need treatment but no current evidence of tumors  Bipolar with schizoaffective tendencies -cont lithium -followed by psych -lithium level  Continue diet and meds as discussed. Further disposition pending results of labs.  HPI 60 y.o. female  presents for 3 month follow up with hypertension, hyperlipidemia, prediabetes and vitamin D.   Her blood pressure has been controlled at home, today their BP is BP: 110/78 mmHg.   She does workout. She denies chest pain, shortness of breath, dizziness.  She has been trying to walk but the weather has made it a little more difficult.  She is actively trying to lose some weight.     She is not on cholesterol medication and denies myalgias. Her cholesterol is at goal. The cholesterol last visit was:   Lab Results  Component Value Date   CHOL 121* 07/24/2015   HDL 25* 07/24/2015   LDLCALC 58 07/24/2015   TRIG 191* 07/24/2015   CHOLHDL 4.8 07/24/2015     She has been working on diet and exercise for prediabetes, and denies foot ulcerations, hyperglycemia, hypoglycemia , increased appetite, nausea, paresthesia of the feet, polydipsia, polyuria, visual disturbances, vomiting and weight loss. Last A1C in the office was:  Lab Results  Component Value Date   HGBA1C 5.3 07/24/2015    Patient is on Vitamin D supplement.  Lab  Results  Component Value Date   VD25OH 31 07/24/2015     She has been seeing an infectious disease specialist due to chronic hep C.  She is due to see them next on Feb 1st.    Current Medications:  Current Outpatient Prescriptions on File Prior to Visit  Medication Sig Dispense Refill  . levothyroxine (SYNTHROID, LEVOTHROID) 75 MCG tablet Take 1 tablet (75 mcg total) by mouth daily. 30 tablet 6  . lithium 300 MG tablet Take 600 mg by mouth at bedtime.    . sertraline (ZOLOFT) 100 MG tablet TK 1 T PO QHS  0  . thiothixene (NAVANE) 5 MG capsule Take 5 mg by mouth 2 (two) times daily.    . trihexyphenidyl (ARTANE) 5 MG tablet Take 5 mg by mouth at bedtime.     No current facility-administered medications on file prior to visit.    Medical History:  Past Medical History  Diagnosis Date  . Mental disorder   . Hepatitis   . Anxiety   . Depression   . Thyroid disease   . Hypothyroidism   . Prediabetes   . Labile hypertension     Allergies:  Allergies  Allergen Reactions  . Sulfa Antibiotics Rash     Review of Systems:  Review of Systems  Constitutional: Negative for fever, chills and malaise/fatigue.  HENT: Positive for congestion. Negative for ear pain and sore throat.   Respiratory: Positive for cough. Negative for sputum production, shortness of breath and wheezing.   Cardiovascular: Negative for  chest pain, palpitations and leg swelling.  Gastrointestinal: Positive for diarrhea. Negative for heartburn, abdominal pain, constipation, blood in stool and melena.  Genitourinary: Negative.   Skin: Negative.   Neurological: Negative for dizziness, sensory change, loss of consciousness and headaches.  Psychiatric/Behavioral: Negative for depression. The patient is nervous/anxious. The patient does not have insomnia.     Family history- Review and unchanged  Social history- Review and unchanged  Physical Exam: BP 110/78 mmHg  Pulse 80  Temp(Src) 97.8 F (36.6 C)  (Temporal)  Resp 16  Ht 5\' 4"  (1.626 m)  Wt 184 lb (83.462 kg)  BMI 31.57 kg/m2 Wt Readings from Last 3 Encounters:  10/26/15 184 lb (83.462 kg)  07/24/15 185 lb 9.6 oz (84.188 kg)  06/15/15 187 lb (84.823 kg)    General Appearance: Well nourished well developed, in no apparent distress. Eyes: PERRLA, EOMs, conjunctiva no swelling or erythema ENT/Mouth: Ear canals normal without obstruction, swelling, erythma, discharge.  TMs normal bilaterally.  Oropharynx moist, clear, without exudate, or postoropharyngeal swelling. Neck: Supple, thyroid normal,no cervical adenopathy  Respiratory: Respiratory effort normal, Breath sounds clear A&P without rhonchi, wheeze, or rale.  No retractions, no accessory usage. Cardio: RRR with no MRGs. Brisk peripheral pulses without edema.  Abdomen: Soft, + BS,  Non tender, no guarding, rebound, hernias, masses. Musculoskeletal: Full ROM, 5/5 strength, Normal gait Skin: Warm, dry without rashes, lesions, ecchymosis.  Neuro: Awake and oriented X 3, Cranial nerves intact. Normal muscle tone, no cerebellar symptoms. Psych: Normal affect, Insight and Judgment appropriate.    Starlyn Skeans, PA-C 11:40 AM [facility]

## 2015-10-27 LAB — HEMOGLOBIN A1C
HEMOGLOBIN A1C: 5.2 % (ref <5.7)
Mean Plasma Glucose: 103 mg/dL (ref <117)

## 2015-10-27 LAB — LITHIUM LEVEL: LITHIUM LVL: 1 meq/L (ref 0.80–1.40)

## 2015-11-06 DIAGNOSIS — F331 Major depressive disorder, recurrent, moderate: Secondary | ICD-10-CM | POA: Diagnosis not present

## 2015-11-09 DIAGNOSIS — F331 Major depressive disorder, recurrent, moderate: Secondary | ICD-10-CM | POA: Diagnosis not present

## 2015-11-15 DIAGNOSIS — B182 Chronic viral hepatitis C: Secondary | ICD-10-CM | POA: Diagnosis not present

## 2015-11-23 ENCOUNTER — Other Ambulatory Visit: Payer: Self-pay | Admitting: Internal Medicine

## 2015-12-05 DIAGNOSIS — F331 Major depressive disorder, recurrent, moderate: Secondary | ICD-10-CM | POA: Diagnosis not present

## 2015-12-12 DIAGNOSIS — F331 Major depressive disorder, recurrent, moderate: Secondary | ICD-10-CM | POA: Diagnosis not present

## 2015-12-20 DIAGNOSIS — B182 Chronic viral hepatitis C: Secondary | ICD-10-CM | POA: Diagnosis not present

## 2015-12-26 ENCOUNTER — Other Ambulatory Visit: Payer: Self-pay | Admitting: Physician Assistant

## 2016-01-02 DIAGNOSIS — F331 Major depressive disorder, recurrent, moderate: Secondary | ICD-10-CM | POA: Diagnosis not present

## 2016-01-03 DIAGNOSIS — Z79899 Other long term (current) drug therapy: Secondary | ICD-10-CM | POA: Diagnosis not present

## 2016-01-03 DIAGNOSIS — K740 Hepatic fibrosis: Secondary | ICD-10-CM | POA: Diagnosis not present

## 2016-01-03 DIAGNOSIS — B182 Chronic viral hepatitis C: Secondary | ICD-10-CM | POA: Diagnosis not present

## 2016-01-08 DIAGNOSIS — F331 Major depressive disorder, recurrent, moderate: Secondary | ICD-10-CM | POA: Diagnosis not present

## 2016-01-17 ENCOUNTER — Ambulatory Visit (INDEPENDENT_AMBULATORY_CARE_PROVIDER_SITE_OTHER): Payer: PPO | Admitting: Physician Assistant

## 2016-01-17 ENCOUNTER — Encounter: Payer: Self-pay | Admitting: Physician Assistant

## 2016-01-17 VITALS — BP 110/72 | HR 74 | Temp 99.5°F | Resp 14 | Ht 64.0 in | Wt 181.6 lb

## 2016-01-17 DIAGNOSIS — F329 Major depressive disorder, single episode, unspecified: Secondary | ICD-10-CM | POA: Diagnosis not present

## 2016-01-17 DIAGNOSIS — F250 Schizoaffective disorder, bipolar type: Secondary | ICD-10-CM | POA: Diagnosis not present

## 2016-01-17 DIAGNOSIS — Z79899 Other long term (current) drug therapy: Secondary | ICD-10-CM | POA: Diagnosis not present

## 2016-01-17 DIAGNOSIS — R7989 Other specified abnormal findings of blood chemistry: Secondary | ICD-10-CM

## 2016-01-17 DIAGNOSIS — E559 Vitamin D deficiency, unspecified: Secondary | ICD-10-CM

## 2016-01-17 DIAGNOSIS — F32A Depression, unspecified: Secondary | ICD-10-CM

## 2016-01-17 DIAGNOSIS — R6889 Other general symptoms and signs: Secondary | ICD-10-CM | POA: Diagnosis not present

## 2016-01-17 DIAGNOSIS — I10 Essential (primary) hypertension: Secondary | ICD-10-CM

## 2016-01-17 DIAGNOSIS — E039 Hypothyroidism, unspecified: Secondary | ICD-10-CM | POA: Diagnosis not present

## 2016-01-17 DIAGNOSIS — Z0001 Encounter for general adult medical examination with abnormal findings: Secondary | ICD-10-CM

## 2016-01-17 DIAGNOSIS — F315 Bipolar disorder, current episode depressed, severe, with psychotic features: Secondary | ICD-10-CM

## 2016-01-17 DIAGNOSIS — R7303 Prediabetes: Secondary | ICD-10-CM

## 2016-01-17 DIAGNOSIS — R0989 Other specified symptoms and signs involving the circulatory and respiratory systems: Secondary | ICD-10-CM

## 2016-01-17 DIAGNOSIS — R300 Dysuria: Secondary | ICD-10-CM

## 2016-01-17 DIAGNOSIS — E782 Mixed hyperlipidemia: Secondary | ICD-10-CM | POA: Diagnosis not present

## 2016-01-17 DIAGNOSIS — R945 Abnormal results of liver function studies: Secondary | ICD-10-CM

## 2016-01-17 DIAGNOSIS — B1920 Unspecified viral hepatitis C without hepatic coma: Secondary | ICD-10-CM

## 2016-01-17 DIAGNOSIS — Z0000 Encounter for general adult medical examination without abnormal findings: Secondary | ICD-10-CM

## 2016-01-17 MED ORDER — CIPROFLOXACIN HCL 500 MG PO TABS: 500.0000 mg | ORAL_TABLET | Freq: Two times a day (BID) | ORAL | Status: AC

## 2016-01-17 NOTE — Progress Notes (Signed)
Patient ID: TERRESSA HUTZELL, female   DOB: [birth year], 60 y.o.   MRN: [id number]  Medicare  Annual  Wellness Visit And Acute visit  Assessment:   1. Medicare visit Will schedule MGM, will consider colonoscopy versus cologuard  2. Labile hypertension  3. Mixed hyperlipidemia  4. Prediabetes  5. Vitamin D deficiency - Vit D  25 hydroxy (rtn osteoporosis monitoring)  6. Depression screen  7. Depression, controlled  8. Hypothyroidism, unspecified hypothyroidism type - TSH  9. Schizoaffective disorder, bipolar type ([place]) Continue follow up  10. Screening for rectal cancer  - POC Hemoccult Bld/Stl (3-Cd Home Screen); Future  11. Hepatitis C virus infection without hepatic coma, unspecified chronicity - continue follow up liver clinic  12. Medication management   13. Morbid obesity, unspecified obesity type ([place])   14. Abnormal LFTs  Future Appointments Date Time Provider [place]  01/30/2016 11:00 AM Unk Pinto, MD [facility] None  08/20/2016 2:00 PM Unk Pinto, MD [facility] None    Plan:   During the course of the visit the patient was educated and counseled about appropriate screening and preventive services including:    Pneumococcal vaccine   Influenza vaccine  Td vaccine  Screening electrocardiogram  Bone densitometry screening  Colorectal cancer screening  Diabetes screening  Glaucoma screening  Nutrition counseling   Advanced directives: requested  Conditions/risks identified: BMI: Discussed weight loss, diet, and increase physical activity.  Increase physical activity: AHA recommends 150 minutes of physical activity a week.  Medications reviewed PreDiabetes is not at goal, ACE/ARB therapy: Not Indicated yet  Urinary Incontinence is not an issue: discussed non pharmacology and pharmacology options.  Fall risk: low- discussed PT, home fall assessment, medications.   Subjective:   SHANDIA PORRATA  presents for [facility]  Annual Wellness Visit and UTI.   This very nice 60 y.o. DWF also presents for follow up with Hypertension, Hyperlipidemia, Pre-Diabetes, Hypothyroidism  and Vitamin D Deficiency.   She has been following with Dr. Zollie Scale at liver care center for hepatitis C, treated with epclusa.   Has been having UTI symptoms x 1 week, increasing frequency, urgency, some pain at right lower AB, some lower back pain, has had chills without fever. No nausea, hematuria, flank pain. No vaginal discharge, itching.   In the spring of 2013 she had a psychotic break and presented with classic Sn's & Sx's of Schizophrenia and had several Psych admissions and she was also Dx'd Bipolar Manic Depressive. Currently she is followed by [facility] by a staff therapist & Psychiatrist to manage her psychotropic medications.   Patient has hx/o labile HTN circa 2005 and has been monitored expectantly & BP has been controlled and today's BP: 110/72 mmHg. Patient has had no complaints of any cardiac type chest pain, palpitations, dyspnea/orthopnea/PND, dizziness, claudication, or dependent edema.  Hyperlipidemia is controlled with diet & meds. Patient denies myalgias or other med SE's. Last Lipids were at goal with  Lab Results  Component Value Date   CHOL 125 10/26/2015   HDL 28* 10/26/2015   LDLCALC 64 10/26/2015   TRIG 164* 10/26/2015   CHOLHDL 4.5 10/26/2015   Also, the patient has history of PreDiabetes with Insulin Resistance with A1c 5.5% and elevated Insulin level 35 in Jan 2014 and has had no symptoms of reactive hypoglycemia, diabetic polys, paresthesias or visual blurring.   Lab Results  Component Value Date   HGBA1C 5.2 10/26/2015   Patient has been on thyroid replacement circa  2005 and remained in therapeutic range on routine monitoring.  Lab Results  Component Value Date   TSH 1.551 10/26/2015   Further, the patient also has history of Vitamin D Deficiency of 29 in June 2015 and she is  not currently on Vit d supplements. Lab Results  Component Value Date   VD25OH 31 07/24/2015   Names of Other Physician/Practitioners you currently use: 1. [facility] here for primary care 2. Dr Sherlean Foot, OD, eye doctor, last visit 2016  3. Dr Clarene Essex, dentist, last visit 2015  Patient Care Team: Unk Pinto, MD as PCP - General (Internal Medicine) Max Villa Herb, DPM as Consulting Physician (Podiatry) Inda Castle, MD as Consulting Physician (Gastroenterology) Thalia Bloodgood, OD as Referring Physician (Optometry)  Medication Review:   Medication List       This list is accurate as of: 01/17/16  2:05 PM.  Always use your most recent med list.               EPCLUSA 400-100 MG Tabs  Generic drug:  Sofosbuvir-Velpatasvir  Take by mouth daily. Take one tablet by mouth daily for 12 weeks.     levothyroxine 75 MCG tablet  Commonly known as:  SYNTHROID, LEVOTHROID  TAKE 1 TABLET BY MOUTH EVERY DAY     lithium 300 MG tablet  Take 600 mg by mouth at bedtime.     sertraline 100 MG tablet  Commonly known as:  ZOLOFT  TK 1 T PO QHS     thiothixene 5 MG capsule  Commonly known as:  NAVANE  Take 5 mg by mouth 2 (two) times daily.     trihexyphenidyl 5 MG tablet  Commonly known as:  ARTANE  Take 5 mg by mouth at bedtime.        Current Problems (verified) Patient Active Problem List   Diagnosis Date Noted  . BMI 31.84,adult 07/24/2015  . Morbid obesity ([place])  (BMI 31.84) 07/24/2015  . Hypothyroidism 12/13/2014  . Mixed hyperlipidemia 09/01/2014  . Vitamin D deficiency 09/01/2014  . Bipolar affective disorder, depressed, severe, with psychotic behavior ([place]) 09/01/2014  . Medication management 03/30/2014  . Other malaise and fatigue 03/30/2014  . Anemia, unspecified 03/30/2014  . Anxiety   . Depression, controlled   . Prediabetes   . Labile hypertension   . Schizoaffective disorder, bipolar type ([place]) 09/12/2011     Screening Tests Immunization History  Administered Date(s) Administered  . Pneumococcal-Unspecified 10/14/2001  . Td 10/15/1995    Preventative care: Last colonoscopy: 03/2001 by Dr Deatra Ina declines another colonoscopy MGM 2001 [place] Ab 09/2015  Declines vaccines  Family History  Problem Relation Age of Onset  . Hyperlipidemia Mother   . Hypertension Mother   . Colon polyps Mother   . Cancer Father     Lung  . Hyperlipidemia Brother    Allergies  Allergen Reactions  . Sulfa Antibiotics Rash    Past Surgical History  Procedure Laterality Date  . Arm fracture    . Rotator cuff repair     Risk Factors: Tobacco Social History  Substance Use Topics  . Smoking status: Never Smoker   . Smokeless tobacco: Never Used  . Alcohol Use: No   She does not smoke.  Patient is not a former smoker. Are there smokers in your home (other than you)?  No Alcohol Current alcohol use: none  Caffeine Current caffeine use: coffee 0-1 cups /day  Exercise Current exercise: walking  Nutrition/Diet Current diet:  in general, a "healthy" diet    Cardiac risk factors: dyslipidemia, obesity (BMI >= 30 kg/m2) and sedentary lifestyle.  Depression Screen (Note: if answer to either of the following is "Yes", a more complete depression screening is indicated)   Q1: Over the past two weeks, have you felt down, depressed or hopeless? No  Q2: Over the past two weeks, have you felt little interest or pleasure in doing things? No  Have you lost interest or pleasure in daily life? No  Do you often feel hopeless? No  Do you cry easily over simple problems? No  Activities of Daily Living In your present state of health, do you have any difficulty performing the following activities?:  Driving? No Managing money?  No Feeding yourself? No Getting from bed to chair? No Climbing a flight of stairs? No Preparing food and eating?: No Bathing or showering? No Getting dressed: No Getting to the  toilet? No Using the toilet:No Moving around from place to place: No In the past year have you fallen or had a near fall?:No  Vision Difficulties: No  Hearing Difficulties: No Do you often ask people to speak up or repeat themselves? No Do you experience ringing or noises in your ears? No Do you have difficulty understanding soft or whispered voices? Sometimes.  Cognition  Do you feel that you have a problem with memory?No  Do you often misplace items? No  Do you feel safe at home?  Yes  Advanced directives Does patient have a [place]? No - given forms Does patient have a Living Will? No - given forms  Objective:     BP 110/72 mmHg  Pulse 74  Temp(Src) 99.5 F (37.5 C) (Temporal)  Resp 14  Ht 5\' 4"  (1.626 m)  Wt 181 lb 9.6 oz (82.373 kg)  BMI 31.16 kg/m2  SpO2 95%  General Appearance: Well nourished, alert, WD/WN, female and in no apparent distress. Eyes: PERRLA, EOMs, conjunctiva no swelling or erythema, normal fundi and vessels. Sinuses: No frontal/maxillary tenderness ENT/Mouth: EACs patent / TMs  nl. Nares clear without erythema, swelling, mucoid exudates. Oral hygiene is good. No erythema, swelling, or exudate. Tongue normal, non-obstructing. Tonsils not swollen or erythematous. Hearing normal.  Neck: Supple, thyroid normal. No bruits, nodes or JVD. Respiratory: Respiratory effort normal.  BS equal and clear bilateral without rales, rhonci, wheezing or stridor. Cardio: Heart sounds are normal with regular rate and rhythm and no murmurs, rubs or gallops. Peripheral pulses are normal and equal bilaterally without edema. No aortic or femoral bruits. Chest: symmetric with normal excursions and percussion. Breasts: declines  Abdomen: Flat, soft  with nl bowel sounds. Diffuse tenderness, no guarding, rebound, hernias, masses, or organomegaly.  No CVA tendernss Lymphatics: Non tender without lymphadenopathy.  Genitourinary:  Musculoskeletal: Full ROM  all peripheral extremities, joint stability, 5/5 strength, and normal gait. Skin: Warm and dry without rashes, lesions, cyanosis, clubbing or  ecchymosis.  Neuro: Cranial nerves intact, reflexes equal bilaterally. Normal muscle tone, no cerebellar symptoms. Sensation intact.  Pysch: Alert and oriented X 3, normal affect, Insight and Judgment appropriate.   Cognitive Testing  Alert? Yes  Normal Appearance?Yes  Oriented to person? Yes  Place? Yes   Time? Yes  Recall of three objects?  Yes  Can perform simple calculations? Yes  Displays appropriate judgment? Yes  Can read the correct time from a watch/clock?Yes  Medicare Attestation I have personally reviewed: The patient's medical and social history Their use of alcohol, tobacco  or illicit drugs Their current medications and supplements The patient's functional ability including ADLs,fall risks, home safety risks, cognitive, and hearing and visual impairment Diet and physical activities Evidence for depression or mood disorders  The patient's weight, height, BMI, and visual acuity have been recorded in the chart.  I have made referrals, counseling, and provided education to the patient based on review of the above and I have provided the patient with a written personalized care plan for preventive services.  Over 40 minutes of exam, counseling, chart review was performed.  Vicie Mutters, PA-C   01/17/2016

## 2016-01-17 NOTE — Patient Instructions (Addendum)
[facility]  7 a.m.-6:30 p.m., Monday 7 a.m.-5 p.m., Tuesday-Friday Schedule an appointment by calling [phone].  [facility] Schedule an appointment by calling [phone].  Encourage you to get the 3D Mammogram  The 3D Mammogram is much more specific and sensitive to pick up breast cancer. For women with fibrocystic breast or lumpy breast it can be hard to determine if it is cancer or not but the 3D mammogram is able to tell this difference which cuts back on unneeded additional tests or scary call backs.   - over 40% increase in detection of breast cancer - over 40% reduction in false positives.  - fewer call backs - reduced anxiety - improved outcomes - [place] [facility]  Your ears and sinuses are connected by the eustachian tube. When your sinuses are inflamed, this can close off the tube and cause fluid to collect in your middle ear. This can then cause dizziness, popping, clicking, ringing, and echoing in your ears. This is often NOT an infection and does NOT require antibiotics, it is caused by inflammation so the treatments help the inflammation. This can take a long time to get better so please be patient.  Here are things you can do to help with this: - Try the Flonase or Nasonex. Remember to spray each nostril twice towards the outer part of your eye.  Do not sniff but instead pinch your nose and tilt your head back to help the medicine get into your sinuses.  The best time to do this is at bedtime.Stop if you get blurred vision or nose bleeds.  -While drinking fluids, pinch and hold nose close and swallow, to help open eustachian tubes to drain fluid behind ear drums. -Please pick one of the over the counter allergy medications below and take it once daily for allergies.  It will also help with fluid behind ear drums. Claritin or loratadine cheapest but likely the weakest  Zyrtec or certizine at night because it can make you sleepy The  strongest is allegra or fexafinadine  Cheapest at [employer], [employer], [employer] -can use decongestant over the counter, please do not use if you have high blood pressure or certain heart conditions.   if worsening HA, changes vision/speech, imbalance, weakness go to the ER   Cologuard is an easy to use noninvasive colon cancer screening test based on the latest advances in stool DNA science.   Colon cancer is 3rd most diagnosed cancer and 2nd leading cause of death in both men and women 23 years of age and older despite being one of the most preventable and treatable cancers if found early.  4 of out 5 people diagnosed with colon cancer have NO prior family history.  When caught EARLY 90% of colon cancer is curable.   You have agreed to do a Cologuard screening and have declined a colonoscopy in spite of being explained the risks and benefits of the colonoscopy in detail, including cancer and death. Please understand that this is test not as sensitive or specific as a colonoscopy and you are still recommended to get a colonoscopy.   If you are NOT medicare please call your insurance company and given them this CPT code, [phone], in order to see how much your insurance company will cover Or you can call [phone] to talk with Cologuard about pricing and coverage.  Out-of-pocket cost for Cologuard can range from $0 - $649 so please call  You will receive a short call from  Cologuard Customer support center at [employer], when you receive a call they will say they are from [place],  to confirm your mailing address and give you more information.  When they calll you, it will appear on the caller ID as "[employer]" or in some cases only this number will appear, [phone].   [employer] will ship your collection kit directly to you. You will collect a single stool sample in the privacy of your own home, no special preparation required. You will return the kit  via [facility] pre-paid shipping or pick-up, in the same box it arrived in. Then I will contact you to discuss your results after I receive them from the laboratory.   If you have any questions or concerns, Cologuard Customer Support Specialist are available 24 hours a day, 7 days a week at [phone] or go to [email or web address].    Urinary Tract Infection Urinary tract infections (UTIs) can develop anywhere along your urinary tract. Your urinary tract is your body's drainage system for removing wastes and extra water. Your urinary tract includes two kidneys, two ureters, a bladder, and a urethra. Your kidneys are a pair of bean-shaped organs. Each kidney is about the size of your fist. They are located below your ribs, one on each side of your spine. CAUSES Infections are caused by microbes, which are microscopic organisms, including fungi, viruses, and bacteria. These organisms are so small that they can only be seen through a microscope. Bacteria are the microbes that most commonly cause UTIs. SYMPTOMS  Symptoms of UTIs may vary by age and gender of the patient and by the location of the infection. Symptoms in young women typically include a frequent and intense urge to urinate and a painful, burning feeling in the bladder or urethra during urination. Older women and men are more likely to be tired, shaky, and weak and have muscle aches and abdominal pain. A fever may mean the infection is in your kidneys. Other symptoms of a kidney infection include pain in your back or sides below the ribs, nausea, and vomiting. DIAGNOSIS To diagnose a UTI, your caregiver will ask you about your symptoms. Your caregiver will also ask you to provide a urine sample. The urine sample will be tested for bacteria and white blood cells. White blood cells are made by your body to help fight infection. TREATMENT  Typically, UTIs can be treated with medication. Because most UTIs are caused by a bacterial infection, they  usually can be treated with the use of antibiotics. The choice of antibiotic and length of treatment depend on your symptoms and the type of bacteria causing your infection. HOME CARE INSTRUCTIONS  If you were prescribed antibiotics, take them exactly as your caregiver instructs you. Finish the medication even if you feel better after you have only taken some of the medication.  Drink enough water and fluids to keep your urine clear or pale yellow.  Avoid caffeine, tea, and carbonated beverages. They tend to irritate your bladder.  Empty your bladder often. Avoid holding urine for long periods of time.  Empty your bladder before and after sexual intercourse.  After a bowel movement, women should cleanse from front to back. Use each tissue only once. SEEK MEDICAL CARE IF:   You have back pain.  You develop a fever.  Your symptoms do not begin to resolve within 3 days. SEEK IMMEDIATE MEDICAL CARE IF:   You have severe back pain or lower abdominal pain.  You  develop chills.  You have nausea or vomiting.  You have continued burning or discomfort with urination. MAKE SURE YOU:   Understand these instructions.  Will watch your condition.  Will get help right away if you are not doing well or get worse.   This information is not intended to replace advice given to you by your health care provider. Make sure you discuss any questions you have with your health care provider.   Document Released: 07/10/2005 Document Revised: 06/21/2015 Document Reviewed: 11/08/2011 Elsevier Interactive Patient Education [employer].

## 2016-01-18 LAB — URINE CULTURE

## 2016-01-18 LAB — URINALYSIS, MICROSCOPIC ONLY
Bacteria, UA: NONE SEEN [HPF]
Casts: NONE SEEN [LPF]
Crystals: NONE SEEN [HPF]
Yeast: NONE SEEN [HPF]

## 2016-01-18 LAB — URINALYSIS, ROUTINE W REFLEX MICROSCOPIC
Bilirubin Urine: NEGATIVE
Glucose, UA: NEGATIVE
Hgb urine dipstick: NEGATIVE
Ketones, ur: NEGATIVE
Nitrite: NEGATIVE
Protein, ur: NEGATIVE
Specific Gravity, Urine: 1.017 (ref 1.001–1.035)
pH: 6 (ref 5.0–8.0)

## 2016-01-19 ENCOUNTER — Other Ambulatory Visit: Payer: Self-pay

## 2016-01-19 DIAGNOSIS — Z1231 Encounter for screening mammogram for malignant neoplasm of breast: Secondary | ICD-10-CM

## 2016-01-27 NOTE — Progress Notes (Signed)
[ [ OV 10.10.2016 -  In the spring of 2013 she had a psychotic break and presented with classic Sn's & Sx's of Schizophrenia and had several Psych admissions and she was also Dx'd Bipolar Manic Depressive. Currently she is followed by [facility] by a staff therapist & Psychiatrist to manage her psychotropic medications. ] ]    Patient ID: Rebekah Pace, female   DOB: September 17, 1956, 60 y.o.   MRN: [id number]   This very nice 60 y.o. DWF presents for  month follow up with Hypertension, Hyperlipidemia, Pre-Diabetes and Vitamin D Deficiency.    Patient is treated for HTN since 2005 & BP has been controlled at home. Today's BP: 122/80 mmHg. Patient has had no complaints of any cardiac type chest pain, palpitations, dyspnea/orthopnea/PND, dizziness, claudication, or dependent edema.   Hyperlipidemia is controlled with diet & meds. Patient denies myalgias or other med SE's. Last Lipids were at goal with  Cholesterol 125; HDL 28*; LDL 64; Triglycerides 164 on 10/26/2015.   Also, the patient has history of PreDiabetes with A1c 5.5% and elevated Insulin 35 in 2014 suspect for Insulin Resistance and has had no symptoms of reactive hypoglycemia, diabetic polys, paresthesias or visual blurring.  Last A1c was  5.2% on 10/26/2015.   Further, the patient also has history of Vitamin D Deficiency of 29 in 2015 and supplements vitamin D sporadically. Last vitamin D was still low 31 on 07/24/2015.     Medication Sig  . levothyroxine  75 MCG  TAKE 1 TABLET BY MOUTH EVERY DAY  . lithium 300 MG tablet Take 600 mg by mouth at bedtime.  . sertraline  100 MG  TK 1 T PO QHS  . Sofosbuvir-Velpatasvir (EPCLUSA) 400-100 MG  Take by mouth daily. Take one tablet by mouth daily for 12 weeks.  Marland Kitchen thiothixene (NAVANE) 5 MG  Take 5 mg by mouth 2 (two) times daily.  . trihexyphenidyl (ARTANE) 5 MG  Take 5 mg by mouth at bedtime.   Allergies  Allergen Reactions  . Sulfa Antibiotics Rash   PMHx:   Past Medical  History  Diagnosis Date  . Mental disorder   . Hepatitis   . Anxiety   . Depression   . Thyroid disease   . Hypothyroidism   . Prediabetes   . Labile hypertension    Immunization History  Administered Date(s) Administered  . Pneumococcal-Unspecified 10/14/2001  . Td 10/15/1995   Past Surgical History  Procedure Laterality Date  . Arm fracture    . Rotator cuff repair     FHx:    Reviewed / unchanged  SHx:    Reviewed / unchanged  Systems Review:  Constitutional: Denies fever, chills, wt changes, headaches, insomnia, fatigue, night sweats, change in appetite. Eyes: Denies redness, blurred vision, diplopia, discharge, itchy, watery eyes.  ENT: Denies discharge, congestion, post nasal drip, epistaxis, sore throat, earache, hearing loss, dental pain, tinnitus, vertigo, sinus pain, snoring.  CV: Denies chest pain, palpitations, irregular heartbeat, syncope, dyspnea, diaphoresis, orthopnea, PND, claudication or edema. Respiratory: denies cough, dyspnea, DOE, pleurisy, hoarseness, laryngitis, wheezing.  Gastrointestinal: Denies dysphagia, odynophagia, heartburn, reflux, water brash, abdominal pain or cramps, nausea, vomiting, bloating, diarrhea, constipation, hematemesis, melena, hematochezia  or hemorrhoids. Genitourinary: Denies dysuria, frequency, urgency, nocturia, hesitancy, discharge, hematuria or flank pain. Musculoskeletal: Denies arthralgias, myalgias, stiffness, jt. swelling, pain, limping or strain/sprain.  Skin: Denies pruritus, rash, hives, warts, acne, eczema or change in skin lesion(s). Neuro: No weakness, tremor, incoordination, spasms,  paresthesia or pain. Psychiatric: Denies confusion, memory loss or sensory loss. Endo: Denies change in weight, skin or hair change.  Heme/Lymph: No excessive bleeding, bruising or enlarged lymph nodes.  Physical Exam  BP 122/80 mmHg  Pulse 68  Temp(Src) 97.5 F (36.4 C)  Resp 16  Ht 5\' 4"  (1.626 m)  Wt 184 lb 6.4 oz (83.643  kg)  BMI 31.64 kg/m2  Appears well nourished and in no distress. Eyes: PERRLA, EOMs, conjunctiva no swelling or erythema. Sinuses: No frontal/maxillary tenderness ENT/Mouth: EAC's clear, TM's nl w/o erythema, bulging. Nares clear w/o erythema, swelling, exudates. Oropharynx clear without erythema or exudates. Oral hygiene is good. Tongue normal, non obstructing. Hearing intact.  Neck: Supple. Thyroid nl. Car 2+/2+ without bruits, nodes or JVD. Chest: Respirations nl with BS clear & equal w/o rales, rhonchi, wheezing or stridor.  Cor: Heart sounds normal w/ regular rate and rhythm without sig. murmurs, gallops, clicks, or rubs. Peripheral pulses normal and equal  without edema.  Abdomen: Soft & bowel sounds normal. Non-tender w/o guarding, rebound, hernias, masses, or organomegaly.  Lymphatics: Unremarkable.  Musculoskeletal: Full ROM all peripheral extremities, joint stability, 5/5 strength, and normal gait.  Skin: Warm, dry without exposed rashes, lesions or ecchymosis apparent.  Neuro: Cranial nerves intact, reflexes equal bilaterally. Sensory-motor testing grossly intact. Tendon reflexes grossly intact.  Pysch: Alert & oriented x 3.  Insight and judgement nl & appropriate. No ideations.  Assessment and Plan:  1. Labile hypertension  - TSH  2. Mixed hyperlipidemia  - Lipid panel - TSH  3. Prediabetes  - Hemoglobin A1c - Insulin, random  4. Vitamin D deficiency  - VITAMIN D 25 Hydroxy   5. Morbid obesity due to excess calories ([place])   6. Hypothyroidism  - TSH  7. Schizoaffective disorder, bipolar type (HCC)  - Lithium level  8. Medication management  - CBC with Differential/Platelet - BASIC METABOLIC PANEL WITH GFR - Hepatic function panel - Magnesium   Recommended regular exercise, BP monitoring, weight control, and discussed med and SE's. Recommended labs to assess and monitor clinical status. Further disposition pending results of labs. Over 30 minutes of  exam, counseling, chart review was performed

## 2016-01-27 NOTE — Patient Instructions (Signed)

## 2016-01-30 ENCOUNTER — Ambulatory Visit (INDEPENDENT_AMBULATORY_CARE_PROVIDER_SITE_OTHER): Payer: PPO | Admitting: Internal Medicine

## 2016-01-30 ENCOUNTER — Encounter: Payer: Self-pay | Admitting: Internal Medicine

## 2016-01-30 VITALS — BP 122/80 | HR 68 | Temp 97.5°F | Resp 16 | Ht 64.0 in | Wt 184.4 lb

## 2016-01-30 DIAGNOSIS — F250 Schizoaffective disorder, bipolar type: Secondary | ICD-10-CM | POA: Diagnosis not present

## 2016-01-30 DIAGNOSIS — I10 Essential (primary) hypertension: Secondary | ICD-10-CM | POA: Diagnosis not present

## 2016-01-30 DIAGNOSIS — R7303 Prediabetes: Secondary | ICD-10-CM | POA: Diagnosis not present

## 2016-01-30 DIAGNOSIS — F259 Schizoaffective disorder, unspecified: Secondary | ICD-10-CM | POA: Diagnosis not present

## 2016-01-30 DIAGNOSIS — E039 Hypothyroidism, unspecified: Secondary | ICD-10-CM

## 2016-01-30 DIAGNOSIS — E782 Mixed hyperlipidemia: Secondary | ICD-10-CM | POA: Diagnosis not present

## 2016-01-30 DIAGNOSIS — F331 Major depressive disorder, recurrent, moderate: Secondary | ICD-10-CM | POA: Diagnosis not present

## 2016-01-30 DIAGNOSIS — E559 Vitamin D deficiency, unspecified: Secondary | ICD-10-CM | POA: Diagnosis not present

## 2016-01-30 DIAGNOSIS — R7309 Other abnormal glucose: Secondary | ICD-10-CM | POA: Diagnosis not present

## 2016-01-30 DIAGNOSIS — R0989 Other specified symptoms and signs involving the circulatory and respiratory systems: Secondary | ICD-10-CM

## 2016-01-30 DIAGNOSIS — Z79899 Other long term (current) drug therapy: Secondary | ICD-10-CM | POA: Diagnosis not present

## 2016-01-30 LAB — CBC WITH DIFFERENTIAL/PLATELET
BASOS ABS: 93 {cells}/uL (ref 0–200)
Basophils Relative: 1 %
EOS PCT: 4 %
Eosinophils Absolute: 372 cells/uL (ref 15–500)
HCT: 36.3 % (ref 35.0–45.0)
HEMOGLOBIN: 12 g/dL (ref 11.7–15.5)
LYMPHS ABS: 1488 {cells}/uL (ref 850–3900)
Lymphocytes Relative: 16 %
MCH: 29.1 pg (ref 27.0–33.0)
MCHC: 33.1 g/dL (ref 32.0–36.0)
MCV: 88.1 fL (ref 80.0–100.0)
MONOS PCT: 6 %
MPV: 10.1 fL (ref 7.5–12.5)
Monocytes Absolute: 558 cells/uL (ref 200–950)
NEUTROS ABS: 6789 {cells}/uL (ref 1500–7800)
NEUTROS PCT: 73 %
PLATELETS: 231 10*3/uL (ref 140–400)
RBC: 4.12 MIL/uL (ref 3.80–5.10)
RDW: 13.4 % (ref 11.0–15.0)
WBC: 9.3 10*3/uL (ref 3.8–10.8)

## 2016-01-30 LAB — TSH: TSH: 4.04 m[IU]/L

## 2016-01-30 LAB — HEMOGLOBIN A1C
HEMOGLOBIN A1C: 4.8 % (ref <5.7)
Mean Plasma Glucose: 91 mg/dL

## 2016-01-31 ENCOUNTER — Inpatient Hospital Stay (HOSPITAL_COMMUNITY)
Admission: EM | Admit: 2016-01-31 | Discharge: 2016-02-04 | DRG: 683 | Disposition: A | Discharge status: Home / Self Care | Payer: PPO | Attending: Internal Medicine | Admitting: Internal Medicine

## 2016-01-31 ENCOUNTER — Encounter (HOSPITAL_COMMUNITY): Payer: Self-pay | Admitting: *Deleted

## 2016-01-31 ENCOUNTER — Other Ambulatory Visit: Payer: Self-pay | Admitting: Internal Medicine

## 2016-01-31 DIAGNOSIS — F329 Major depressive disorder, single episode, unspecified: Secondary | ICD-10-CM

## 2016-01-31 DIAGNOSIS — F259 Schizoaffective disorder, unspecified: Secondary | ICD-10-CM | POA: Diagnosis present

## 2016-01-31 DIAGNOSIS — N179 Acute kidney failure, unspecified: Secondary | ICD-10-CM | POA: Diagnosis not present

## 2016-01-31 DIAGNOSIS — N390 Urinary tract infection, site not specified: Secondary | ICD-10-CM | POA: Diagnosis not present

## 2016-01-31 DIAGNOSIS — Z6831 Body mass index (BMI) 31.0-31.9, adult: Secondary | ICD-10-CM

## 2016-01-31 DIAGNOSIS — E039 Hypothyroidism, unspecified: Secondary | ICD-10-CM | POA: Diagnosis present

## 2016-01-31 DIAGNOSIS — B182 Chronic viral hepatitis C: Secondary | ICD-10-CM | POA: Diagnosis not present

## 2016-01-31 DIAGNOSIS — Z8619 Personal history of other infectious and parasitic diseases: Secondary | ICD-10-CM | POA: Diagnosis present

## 2016-01-31 DIAGNOSIS — R197 Diarrhea, unspecified: Secondary | ICD-10-CM | POA: Diagnosis not present

## 2016-01-31 DIAGNOSIS — F315 Bipolar disorder, current episode depressed, severe, with psychotic features: Secondary | ICD-10-CM | POA: Diagnosis present

## 2016-01-31 DIAGNOSIS — R109 Unspecified abdominal pain: Secondary | ICD-10-CM | POA: Diagnosis not present

## 2016-01-31 DIAGNOSIS — R0989 Other specified symptoms and signs involving the circulatory and respiratory systems: Secondary | ICD-10-CM | POA: Diagnosis present

## 2016-01-31 DIAGNOSIS — F419 Anxiety disorder, unspecified: Secondary | ICD-10-CM | POA: Diagnosis present

## 2016-01-31 DIAGNOSIS — F325 Major depressive disorder, single episode, in full remission: Secondary | ICD-10-CM | POA: Diagnosis present

## 2016-01-31 DIAGNOSIS — R7303 Prediabetes: Secondary | ICD-10-CM | POA: Diagnosis not present

## 2016-01-31 DIAGNOSIS — I10 Essential (primary) hypertension: Secondary | ICD-10-CM | POA: Diagnosis present

## 2016-01-31 DIAGNOSIS — R1011 Right upper quadrant pain: Secondary | ICD-10-CM

## 2016-01-31 DIAGNOSIS — N170 Acute kidney failure with tubular necrosis: Secondary | ICD-10-CM | POA: Diagnosis not present

## 2016-01-31 DIAGNOSIS — E669 Obesity, unspecified: Secondary | ICD-10-CM | POA: Diagnosis present

## 2016-01-31 DIAGNOSIS — B1920 Unspecified viral hepatitis C without hepatic coma: Secondary | ICD-10-CM | POA: Diagnosis not present

## 2016-01-31 LAB — HEPATIC FUNCTION PANEL
ALBUMIN: 4.1 g/dL (ref 3.6–5.1)
ALT: 11 U/L (ref 6–29)
AST: 17 U/L (ref 10–35)
Alkaline Phosphatase: 71 U/L (ref 33–130)
BILIRUBIN DIRECT: 0.1 mg/dL (ref <=0.2)
BILIRUBIN TOTAL: 0.4 mg/dL (ref 0.2–1.2)
Indirect Bilirubin: 0.3 mg/dL (ref 0.2–1.2)
Total Protein: 7.3 g/dL (ref 6.1–8.1)

## 2016-01-31 LAB — BASIC METABOLIC PANEL WITH GFR
BUN: 43 mg/dL — AB (ref 7–25)
CALCIUM: 9.6 mg/dL (ref 8.6–10.4)
CHLORIDE: 101 mmol/L (ref 98–110)
CO2: 20 mmol/L (ref 20–31)
CREATININE: 5.39 mg/dL — AB (ref 0.50–0.99)
GFR, Est African American: 9 mL/min — ABNORMAL LOW (ref >=60)
GFR, Est Non African American: 8 mL/min — ABNORMAL LOW (ref >=60)
Glucose, Bld: 117 mg/dL — ABNORMAL HIGH (ref 65–99)
Potassium: 4.4 mmol/L (ref 3.5–5.3)
SODIUM: 136 mmol/L (ref 135–146)

## 2016-01-31 LAB — CREATININE, URINE, RANDOM: Creatinine, Urine: 134.91 mg/dL

## 2016-01-31 LAB — CBC
HEMATOCRIT: 37.5 % (ref 36.0–46.0)
HEMOGLOBIN: 12.3 g/dL (ref 12.0–15.0)
MCH: 28.7 pg (ref 26.0–34.0)
MCHC: 32.8 g/dL (ref 30.0–36.0)
MCV: 87.6 fL (ref 78.0–100.0)
Platelets: 267 10*3/uL (ref 150–400)
RBC: 4.28 MIL/uL (ref 3.87–5.11)
RDW: 13.3 % (ref 11.5–15.5)
WBC: 9 10*3/uL (ref 4.0–10.5)

## 2016-01-31 LAB — COMPREHENSIVE METABOLIC PANEL
ALBUMIN: 4 g/dL (ref 3.5–5.0)
ALT: 15 U/L (ref 14–54)
ANION GAP: 11 (ref 5–15)
AST: 22 U/L (ref 15–41)
Alkaline Phosphatase: 71 U/L (ref 38–126)
BILIRUBIN TOTAL: 0.5 mg/dL (ref 0.3–1.2)
BUN: 39 mg/dL — ABNORMAL HIGH (ref 6–20)
CHLORIDE: 106 mmol/L (ref 101–111)
CO2: 22 mmol/L (ref 22–32)
Calcium: 9.8 mg/dL (ref 8.9–10.3)
Creatinine, Ser: 4.15 mg/dL — ABNORMAL HIGH (ref 0.44–1.00)
GFR calc Af Amer: 12 mL/min — ABNORMAL LOW (ref >60)
GFR calc non Af Amer: 11 mL/min — ABNORMAL LOW (ref >60)
GLUCOSE: 99 mg/dL (ref 65–99)
POTASSIUM: 4.1 mmol/L (ref 3.5–5.1)
Sodium: 139 mmol/L (ref 135–145)
TOTAL PROTEIN: 7.7 g/dL (ref 6.5–8.1)

## 2016-01-31 LAB — LIPID PANEL
CHOL/HDL RATIO: 7.5 ratio — AB (ref <=5.0)
CHOLESTEROL: 180 mg/dL (ref 125–200)
HDL: 24 mg/dL — AB (ref >=46)
LDL Cholesterol: 110 mg/dL (ref <130)
Triglycerides: 228 mg/dL — ABNORMAL HIGH (ref <150)
VLDL: 46 mg/dL — ABNORMAL HIGH (ref <30)

## 2016-01-31 LAB — CK: Total CK: 33 U/L — ABNORMAL LOW (ref 38–234)

## 2016-01-31 LAB — SODIUM, URINE, RANDOM: Sodium, Ur: 27 mmol/L

## 2016-01-31 LAB — URINALYSIS, ROUTINE W REFLEX MICROSCOPIC
Bilirubin Urine: NEGATIVE
Glucose, UA: NEGATIVE mg/dL
Hgb urine dipstick: NEGATIVE
Ketones, ur: NEGATIVE mg/dL
NITRITE: NEGATIVE
PH: 5.5 (ref 5.0–8.0)
Protein, ur: NEGATIVE mg/dL
SPECIFIC GRAVITY, URINE: 1.012 (ref 1.005–1.030)

## 2016-01-31 LAB — URINE MICROSCOPIC-ADD ON

## 2016-01-31 LAB — LITHIUM LEVEL
LITHIUM LVL: 1.1 mmol/L (ref 0.60–1.20)
Lithium Lvl: 1.2 mEq/L (ref 0.80–1.40)

## 2016-01-31 LAB — PROTIME-INR
INR: 1.34 (ref 0.00–1.49)
PROTHROMBIN TIME: 16.7 s — AB (ref 11.6–15.2)

## 2016-01-31 LAB — LIPASE, BLOOD: LIPASE: 31 U/L (ref 11–51)

## 2016-01-31 LAB — APTT: APTT: 33 s (ref 24–37)

## 2016-01-31 LAB — MAGNESIUM: MAGNESIUM: 2.1 mg/dL (ref 1.5–2.5)

## 2016-01-31 LAB — INSULIN, RANDOM: INSULIN: 38.3 u[IU]/mL — AB (ref 2.0–19.6)

## 2016-01-31 LAB — VITAMIN D 25 HYDROXY (VIT D DEFICIENCY, FRACTURES): VIT D 25 HYDROXY: 18 ng/mL — AB (ref 30–100)

## 2016-01-31 MED ORDER — DEXTROSE 5 % IV SOLN
1.0000 g | INTRAVENOUS | Status: AC
  Administered 2016-02-01 – 2016-02-02 (×2): 1 g via INTRAVENOUS
  Filled 2016-01-31 (×2): qty 10

## 2016-01-31 MED ORDER — ONDANSETRON HCL 4 MG/2ML IJ SOLN
4.0000 mg | Freq: Three times a day (TID) | INTRAMUSCULAR | Status: AC | PRN
Start: 2016-01-31 — End: 2016-02-01

## 2016-01-31 MED ORDER — MORPHINE SULFATE (PF) 2 MG/ML IV SOLN: 2.0000 mg | INTRAVENOUS | Status: DC | PRN

## 2016-01-31 MED ORDER — SODIUM CHLORIDE 0.9 % IV SOLN
INTRAVENOUS | Status: DC
  Administered 2016-01-31 – 2016-02-01 (×3): via INTRAVENOUS

## 2016-01-31 MED ORDER — LEVOTHYROXINE SODIUM 75 MCG PO TABS
75.0000 ug | ORAL_TABLET | Freq: Every day | ORAL | Status: DC
  Administered 2016-02-01 – 2016-02-04 (×4): 75 ug via ORAL
  Filled 2016-01-31 (×4): qty 1

## 2016-01-31 MED ORDER — THIOTHIXENE 5 MG PO CAPS
5.0000 mg | ORAL_CAPSULE | Freq: Two times a day (BID) | ORAL | Status: DC
  Administered 2016-01-31 – 2016-02-04 (×8): 5 mg via ORAL
  Filled 2016-01-31 (×8): qty 1

## 2016-01-31 MED ORDER — SOFOSBUVIR-VELPATASVIR 400-100 MG PO TABS
1.0000 | ORAL_TABLET | ORAL | Status: DC
  Administered 2016-02-01 – 2016-02-04 (×4): 1 via ORAL
  Filled 2016-01-31 (×2): qty 1

## 2016-01-31 MED ORDER — SODIUM CHLORIDE 0.9 % IV BOLUS (SEPSIS)
1000.0000 mL | Freq: Once | INTRAVENOUS | Status: AC
  Administered 2016-01-31: 1000 mL via INTRAVENOUS

## 2016-01-31 MED ORDER — LITHIUM CARBONATE 300 MG PO CAPS
300.0000 mg | ORAL_CAPSULE | Freq: Every day | ORAL | Status: DC
Start: 2016-01-31 — End: 2016-02-04
  Administered 2016-01-31 – 2016-02-03 (×4): 300 mg via ORAL
  Filled 2016-01-31 (×4): qty 1

## 2016-01-31 MED ORDER — DEXTROSE 5 % IV SOLN: 1.0000 g | INTRAVENOUS | Status: DC

## 2016-01-31 MED ORDER — DEXTROSE 5 % IV SOLN
1.0000 g | Freq: Once | INTRAVENOUS | Status: AC
  Administered 2016-01-31: 1 g via INTRAVENOUS
  Filled 2016-01-31: qty 10

## 2016-01-31 MED ORDER — SERTRALINE HCL 100 MG PO TABS
100.0000 mg | ORAL_TABLET | Freq: Every day | ORAL | Status: DC
  Administered 2016-02-01 – 2016-02-04 (×4): 100 mg via ORAL
  Filled 2016-01-31 (×4): qty 1

## 2016-01-31 MED ORDER — TRIHEXYPHENIDYL HCL 5 MG PO TABS
5.0000 mg | ORAL_TABLET | Freq: Every day | ORAL | Status: DC
  Administered 2016-01-31 – 2016-02-03 (×4): 5 mg via ORAL
  Filled 2016-01-31 (×4): qty 1

## 2016-01-31 MED ORDER — SODIUM CHLORIDE 0.9 % IV SOLN: Freq: Once | INTRAVENOUS | Status: DC

## 2016-01-31 MED ORDER — SODIUM CHLORIDE 0.9 % IV SOLN
Freq: Once | INTRAVENOUS | Status: AC
  Administered 2016-01-31: 20:00:00 via INTRAVENOUS

## 2016-01-31 MED ORDER — HEPARIN SODIUM (PORCINE) 5000 UNIT/ML IJ SOLN
5000.0000 [IU] | Freq: Three times a day (TID) | INTRAMUSCULAR | Status: DC
  Filled 2016-01-31 (×3): qty 1

## 2016-01-31 NOTE — H&P (Addendum)
History and Physical   Rebekah Pace [id number] DOB: 07/07/1956 DOA: 01/31/2016  Referring MD/NP/PA:  PCP: Alesia Richards, MD  Outpatient Specialists: none Patient coming from:  Home  Chief Complaint: Diarrhea, abdominal pain, generalized weakness and increased urinary frequency  HPI: Rebekah Pace is a 60 y.o. female with medical history significant of prediabetes, labile blood pressure, hypothyroidism, depression, anxiety, schizo-affective disorder, manic bipolar disorder, HCV being treated with Epclusa since 11/23/15 (for 12 weeks), who presents with diarrhea, abdominal pain, generalized weakness and increased urinary frequency.  Patient reports that she has been having diarrhea for 6 weeks. She has 1 to 3 BM with loose stool daily. She has nausea, but no vomiting. She also has moderate abdominal pain over right upper quadrant for about 1 week, which has improved today. Patient does not have a fever or chills. She has increased urinary frequency, but no dysuria or burning on urination. She also states that she has intermittent muscle pain over bilateral upper thighs recently. No chest pain, cough, shortness of breath ior unilateral weakness. Pt was seen by PCP today and was found to have AKI, therefore sent to ED for further eval and treatment.  ED Course: pt was found to have therapeutic lithium level at I.10, positive urinalysis with moderate amount of leukocytes, lipase is 31, WBC 9.0, temperature normal, no tachycardia, creatinine 4.15, which was normal 3 months ago. Pt is admitted to inpatient for further eval and treatment and observation. Renal, dr. Augustin Coupe was consulted.   Can patient participate in ADLs?  Yes  Review of Systems:   General: no fevers, chills, no changes in body weight, has fatigue HEENT: no blurry vision, hearing changes or sore throat Pulm: no dyspnea, coughing, wheezing CV: no chest pain, no palpitations Abd: has nausea, abdominal pain, diarrhea, no  constipation or vomiting, GU: no dysuria, burning on urination, has increased urinary frequency, no hematuria  Ext: no leg edema Neuro: no unilateral weakness, numbness, or tingling, no vision change or hearing loss Skin: no rash MSK: No muscle spasm, no deformity, no limitation of range of movement in spin Heme: No easy bruising.  Travel history: No recent long distant travel.  Allergy:  Allergies  Allergen Reactions  . Sulfa Antibiotics Rash    Past Medical History  Diagnosis Date  . Mental disorder   . Hepatitis   . Anxiety   . Depression   . Thyroid disease   . Hypothyroidism   . Prediabetes   . Labile hypertension     Past Surgical History  Procedure Laterality Date  . Arm fracture    . Rotator cuff repair      Social History:  reports that she has never smoked. She has never used smokeless tobacco. She reports that she does not drink alcohol or use illicit drugs.  Family History:  Family History  Problem Relation Age of Onset  . Hyperlipidemia Mother   . Hypertension Mother   . Colon polyps Mother   . Cancer Father     Lung  . Hyperlipidemia Brother      Prior to Admission medications   Medication Sig Start Date End Date Taking? Authorizing Provider  levothyroxine (SYNTHROID, LEVOTHROID) 75 MCG tablet TAKE 1 TABLET BY MOUTH EVERY DAY 12/26/15  Yes Unk Pinto, MD  lithium 300 MG tablet Take 300 mg by mouth at bedtime.    Yes Historical Provider, MD  sertraline (ZOLOFT) 100 MG tablet takes 1 tablet by mouth once daily 06/29/15  Yes Historical Provider, MD  Sofosbuvir-Velpatasvir (EPCLUSA) 400-100 MG TABS Take 1 tablet by mouth daily. Take one tablet by mouth daily for 12 weeks.   Yes Historical Provider, MD  thiothixene (NAVANE) 5 MG capsule Take 5 mg by mouth 2 (two) times daily.   Yes Historical Provider, MD  trihexyphenidyl (ARTANE) 5 MG tablet Take 5 mg by mouth at bedtime.   Yes Historical Provider, MD    Physical Exam: Filed Vitals:   01/31/16  2030 01/31/16 2045 01/31/16 2100 01/31/16 2145  BP: 138/84 144/78 138/76 123/77  Pulse: 75 74 73 72  Temp:    98.6 F (37 C)  Resp:    22  Height:      Weight:    83.3 kg (183 lb 10.3 oz)  SpO2: 100% 100% 98% 99%   General: Not in acute distress HEENT:       Eyes: PERRL, EOMI, no scleral icterus.       ENT: No discharge from the ears and nose, no pharynx injection, no tonsillar enlargement.        Neck: No JVD, no bruit, no mass felt. Heme: No neck lymph node enlargement. Cardiac: S1/S2, RRR, No murmurs, No gallops or rubs. Pulm:  No rales, wheezing, rhonchi or rubs. Abd: Soft, nondistended, mild tenderness over RQU, no rebound pain, no organomegaly, BS present. GU: No hematuria Ext: No pitting leg edema bilaterally. 2+DP/PT pulse bilaterally. Musculoskeletal: No joint deformities, No joint redness or warmth, no limitation of ROM in spin. Skin: No rashes. Decubitus ulcers Neuro: Alert, oriented X3, cranial nerves II-XII grossly intact, moves all extremities normally. Psych: Patient is not psychotic, no suicidal or hemocidal ideation.  Labs on Admission: I have personally reviewed following labs and imaging studies  CBC:  Recent Labs Lab 01/30/16 1221 01/31/16 1548  WBC 9.3 9.0  NEUTROABS 6789  --   HGB 12.0 12.3  HCT 36.3 37.5  MCV 88.1 87.6  PLT 231 [id number]   Basic Metabolic Panel:  Recent Labs Lab 01/30/16 1221 01/31/16 1548  NA 136 139  K 4.4 4.1  CL 101 106  CO2 20 22  GLUCOSE 117* 99  BUN 43* 39*  CREATININE 5.39* 4.15*  CALCIUM 9.6 9.8  MG 2.1  --    GFR: Estimated Creatinine Clearance: 15 mL/min (by C-G formula based on Cr of 4.15). Liver Function Tests:  Recent Labs Lab 01/30/16 1221 01/31/16 1548  AST 17 22  ALT 11 15  ALKPHOS 71 71  BILITOT 0.4 0.5  PROT 7.3 7.7  ALBUMIN 4.1 4.0    Recent Labs Lab 01/31/16 1548  LIPASE 31   No results for input(s): AMMONIA in the last 168 hours. Coagulation Profile:  Recent Labs Lab  01/31/16 2120  INR 1.34   Cardiac Enzymes:  Recent Labs Lab 01/31/16 2120  CKTOTAL 33*   BNP (last 3 results) No results for input(s): PROBNP in the last 8760 hours. HbA1C:  Recent Labs  01/30/16 1221  HGBA1C 4.8   CBG: No results for input(s): GLUCAP in the last 168 hours. Lipid Profile:  Recent Labs  01/30/16 1221  CHOL 180  HDL 24*  LDLCALC 110  TRIG 228*  CHOLHDL 7.5*   Thyroid Function Tests:  Recent Labs  01/30/16 1221  TSH 4.04   Anemia Panel: No results for input(s): VITAMINB12, FOLATE, FERRITIN, TIBC, IRON, RETICCTPCT in the last 72 hours. Urine analysis:    Component Value Date/Time   COLORURINE YELLOW 01/31/2016 1546   APPEARANCEUR HAZY* 01/31/2016 1546   LABSPEC 1.012 01/31/2016  [place] 5.5 01/31/2016 1546   GLUCOSEU NEGATIVE 01/31/2016 1546   HGBUR NEGATIVE 01/31/2016 1546   BILIRUBINUR NEGATIVE 01/31/2016 1546   KETONESUR NEGATIVE 01/31/2016 1546   PROTEINUR NEGATIVE 01/31/2016 1546   UROBILINOGEN 0.2 02/09/2012 0244   NITRITE NEGATIVE 01/31/2016 1546   LEUKOCYTESUR MODERATE* 01/31/2016 1546   Sepsis Labs: @LABRCNTIP (procalcitonin:4,lacticidven:4) )No results found for this or any previous visit (from the past 240 hour(s)).   Radiological Exams on Admission: No results found.   EKG: Not done in ED, will get one.   Assessment/Plan Principal Problem:   AKI (acute kidney injury) ([place]) Active Problems:   Depression, controlled   Prediabetes   Labile hypertension   Bipolar affective disorder, depressed, severe, with psychotic behavior ([place])   Hypothyroidism   Morbid obesity (HCC)  (BMI 31.84)   HCV (hepatitis C virus)   Diarrhea   Abdominal pain  AKI (acute kidney injury) ([place]): Etiology is not clear. Renal, Dr. consulted. Per Dr. Augustin Coupe,  differential diagnosis includes prerenal azotemia, ATN, CIN. Though sofosbuvir component of Epclusa can cause rhabdomyolysis, but clinically pt doesn't appear to be rhabdo per Dr. Augustin Coupe.  No renal toxic agents in her MAR; she  denies any NSAID use or herbal remedies.  -Will admit med-surg bed for observation. -highly appreciate Dr. Augustin Coupe 's consultation, with follow-up recommendations. -Aggressive isotonic fluid resuscitation per Dr. Augustin Coupe. IV NS, 1L bolus and then 125 cc/h -per Dr. Augustin Coupe, may benefit from a renal biopsy to define why she has had chronic leukocyturia especially if the urine culture is negative; could this be chronic interstitial nephritis? -check FeNA -pending CK level  Possible UTI: Patient has positive urinalysis with moderate amount of leukocytes. She has a mildly increased urine frequency, indicating possible UTI.  -IV Rocephin started -Follow-up urine culture  Diarrhea: Etiology is not clear. We rule out C. difficile colitis -Check C. difficile PCR and GI pathogen panel -IV fluid as above -Appearing Zofran for nausea and morphine for pain  Abdominal pain over right upper quadrant: Most likely due to hepatitis C. -US-abomen  HCV - on Epclusa. -will continue Epclusa  Schizo affective and manic bipolar disorder: on Lithium with therapeutic level -continue Lithium, Artane and Thiothixene  Hypothyroidism: Last TSH was 4.04 on 4/18/617 -Continue home Synthroid  Depression and anxiety: Stable, no suicidal or homicidal ideations. -Continue home medications: Zolot    DVT ppx: SQ Heparin  (Pt has AKI, sq heparin is better choice than sq Lovenox) Code Status: Full code Family Communication: None at bed side.  Disposition Plan:  Anticipate discharge back to previous home environment Consults called: renal, Dr Augustin Coupe Admission status: obs / medical floor  Date of Service 01/31/2016    Ivor Costa [facility] Pager [phone]  If 7PM-7AM, please contact night-coverage [email or web address] Password [facility] 01/31/2016, 10:32 PM

## 2016-01-31 NOTE — ED Notes (Signed)
The pt had blood drawn 2 days ago and some of her lab work was elevated.  Sent here from her doctors office for tretament

## 2016-01-31 NOTE — ED Notes (Signed)
C/o abd pain and feeling weak for one week

## 2016-01-31 NOTE — ED Provider Notes (Signed)
CSN: [id number]     Arrival date & time 01/31/16  72 History   First MD Initiated Contact with Patient 01/31/16 1815     Chief Complaint  Patient presents with  . Abnormal Lab     (Consider location/radiation/quality/duration/timing/severity/associated sxs/prior Treatment) HPI  The patient is a 60 year old female, she has a known history of anxiety depression, hepatitis C and has recently been placed on a medication for the hepatitis C approximately 2 months ago. She is also on lithium.  She has had some myalgias, Has felt generally unwell but is not more specific but specifically denies fevers, dysuria, diarrhea, swelling, chest pain. She does have occasional coughing. She was seen by her family doctor yesterday, they did blood work in a routine fashion and found her to be in acute renal failure with a creatinine of 4, they sent her to the hospital today for follow-up and admission to the hospital. The patient denies being dehydrated though she has had decreased appetite recently.  Past Medical History  Diagnosis Date  . Mental disorder   . Hepatitis   . Anxiety   . Depression   . Thyroid disease   . Hypothyroidism   . Prediabetes   . Labile hypertension    Past Surgical History  Procedure Laterality Date  . Arm fracture    . Rotator cuff repair     Family History  Problem Relation Age of Onset  . Hyperlipidemia Mother   . Hypertension Mother   . Colon polyps Mother   . Cancer Father     Lung  . Hyperlipidemia Brother    Social History  Substance Use Topics  . Smoking status: Never Smoker   . Smokeless tobacco: Never Used  . Alcohol Use: No   OB History    No data available     Review of Systems  All other systems reviewed and are negative.     Allergies  Sulfa antibiotics  Home Medications   Prior to Admission medications   Medication Sig Start Date End Date Taking? Authorizing Provider  levothyroxine (SYNTHROID, LEVOTHROID) 75 MCG tablet TAKE 1  TABLET BY MOUTH EVERY DAY 12/26/15   Unk Pinto, MD  lithium 300 MG tablet Take 600 mg by mouth at bedtime.    Historical Provider, MD  sertraline (ZOLOFT) 100 MG tablet TK 1 T PO QHS 06/29/15   Historical Provider, MD  Sofosbuvir-Velpatasvir (EPCLUSA) 400-100 MG TABS Take by mouth daily. Take one tablet by mouth daily for 12 weeks.    Historical Provider, MD  thiothixene (NAVANE) 5 MG capsule Take 5 mg by mouth 2 (two) times daily.    Historical Provider, MD  trihexyphenidyl (ARTANE) 5 MG tablet Take 5 mg by mouth at bedtime.    Historical Provider, MD   BP 158/78 mmHg  Pulse 70  Temp(Src) 98.7 F (37.1 C)  Resp 18  Ht 5\' 4"  (1.626 m)  Wt 181 lb 9 oz (82.356 kg)  BMI 31.15 kg/m2  SpO2 98% Physical Exam  Constitutional: She appears well-developed and well-nourished. No distress.  HENT:  Head: Normocephalic and atraumatic.  Mouth/Throat: Oropharynx is clear and moist. No oropharyngeal exudate.  Eyes: Conjunctivae and EOM are normal. Pupils are equal, round, and reactive to light. Right eye exhibits no discharge. Left eye exhibits no discharge. No scleral icterus.  Neck: Normal range of motion. Neck supple. No JVD present. No thyromegaly present.  Cardiovascular: Normal rate, regular rhythm, normal heart sounds and intact distal pulses.  Exam reveals no gallop  and no friction rub.   No murmur heard. Pulmonary/Chest: Effort normal and breath sounds normal. No respiratory distress. She has no wheezes. She has no rales.  Abdominal: Soft. Bowel sounds are normal. She exhibits no distension and no mass. There is no tenderness.  Musculoskeletal: Normal range of motion. She exhibits no edema or tenderness.  Lymphadenopathy:    She has no cervical adenopathy.  Neurological: She is alert. Coordination normal.  Skin: Skin is warm and dry. No rash noted. No erythema.  Psychiatric: She has a normal mood and affect. Her behavior is normal.  Nursing note and vitals reviewed.   ED Course   Procedures (including critical care time) Labs Review Labs Reviewed  COMPREHENSIVE METABOLIC PANEL - Abnormal; Notable for the following:    BUN 39 (*)    Creatinine, Ser 4.15 (*)    GFR calc non Af Amer 11 (*)    GFR calc Af Amer 12 (*)    All other components within normal limits  URINALYSIS, ROUTINE W REFLEX MICROSCOPIC (NOT AT [facility]) - Abnormal; Notable for the following:    APPearance HAZY (*)    Leukocytes, UA MODERATE (*)    All other components within normal limits  URINE MICROSCOPIC-ADD ON - Abnormal; Notable for the following:    Squamous Epithelial / LPF 6-30 (*)    Bacteria, UA FEW (*)    All other components within normal limits  URINE CULTURE  LIPASE, BLOOD  CBC  LITHIUM LEVEL    Imaging Review No results found. I have personally reviewed and evaluated these images and lab results as part of my medical decision-making.   MDM   Final diagnoses:  Acute renal failure, unspecified acute renal failure type ([place])    The patient has a normal exam other than appearing anxious and slightly jittery. Bedside ultrasound reveals no hydronephrosis in the normal appearing bladder without distention. She does not have an obstructive uropathy, she does have acute renal failure with a creatinine of 4 but her BUN is only 39. There is some bacteria and some white blood cells in the urine, nitrite negative, will treat for UTI, culture urine, admitted to the hospital. The patient does take lithium, she will need to have a lithium level as this could potentially be the reason that she is weak and in kidney failure.  Labs reviewed, lithium level within normal limits, vital signs remain normal, this was discussed with the hospitalist who will admit the patient to a medical surgical bed for observation, he requests holding orders.  I appreciate Dr. Edgar Frisk continued care of the patient.  Noemi Chapel, MD 01/31/16 2042

## 2016-01-31 NOTE — Consult Note (Signed)
Reason for Consult: Acute Kidney Injury Referring Physician: Dr. Ivor Costa  Chief Complaint: abnormal chemistry panel  Assessment/Plan: 1. Acute Kidney Injury - differential diagnosis includes prerenal azotemia, ATN, CIN (with leukocyturia noted chronically - unclear if workup has been done other than a urine culture noting multi organisms). Certainly the sofosbuvir component of Epclusa can cause rhabdomyolysis but clinically and by history doesn't appear to be rhabdo. CK pending at the time of consultation. - no acute indication for dialysis. - aggressive isotonic fluid resuscitation; she has a history of n/v/d for over a month and clinically on exam appears to be dry. - no contraindication to aggressive fluid administration as she is very comfortable on room air breathing at a normal rate. - no renal toxic agents in her MAR; she also denies any NSAID use or herbal remedies. - would not stop the Epclusa especially if her renal function continues to imrpove; cr already improving in <24hrs. - Bedside ultrasound has r/o obstructive uropathy. - May benefit from a renal biopsy to define why she has had chronic leukocyturia especially if the urine culture is negative; could this be chronic interstitial nephritis? 2. HCV - on Epclusa. 3. Hypothyroidism - TSH pending. 4. Hypertension - relatively controlled. 5. Schizo affective and manic bipolar disorder - stable @ baseline.   HPI: Rebekah Pace is an 60 y.o. female with schizo-affective disorder + manic bipolar disorder, HCV being treated with Epclusa for a few months already + Lithium which is not at toxic levels. She was told to come to the ED after routine labs revealed renal dysfunction which is new for her. Her last normal creatinine of 0.8 was 3 months ago. She does report n/v/d for over a month + anorexia and poor oral intake. She denies any rashes but does endorse mild muscle aches, fevers, non productive cough. She has some lightheadedness but  has not had any falls or does not report loss of consciousness. She denies any sick contacts, NSAID use or dysuria. She has generalized weakness and malaise.  ROS Pertinent items are noted in HPI.  Chemistry and CBC: CREAT  Date/Time Value Ref Range Status  01/30/2016 12:21 PM 5.39* 0.50 - 0.99 mg/dL Final  10/26/2015 12:11 PM 0.88 0.50 - 0.99 mg/dL Final  07/24/2015 12:50 PM 0.83 0.50 - 0.99 mg/dL Final  03/20/2015 10:48 AM 0.84 0.50 - 1.10 mg/dL Final  12/13/2014 11:45 AM 0.79 0.50 - 1.10 mg/dL Final  09/01/2014 05:38 PM 0.83 0.50 - 1.10 mg/dL Final  03/30/2014 12:15 PM 0.72 0.50 - 1.10 mg/dL Final   CREATININE, SER  Date/Time Value Ref Range Status  01/31/2016 03:48 PM 4.15* 0.44 - 1.00 mg/dL Final  11/05/2013 07:08 PM 0.76 0.50 - 1.10 mg/dL Final  02/10/2012 12:13 PM 0.73 0.50 - 1.10 mg/dL Final  02/09/2012 01:20 PM 0.77 0.50 - 1.10 mg/dL Final  02/09/2012 02:51 AM 0.79 0.50 - 1.10 mg/dL Final  02/04/2012 09:37 PM 0.92 0.50 - 1.10 mg/dL Final  01/21/2012 11:11 PM 0.81 0.50 - 1.10 mg/dL Final  01/19/2012 03:20 PM 0.86 0.50 - 1.10 mg/dL Final  01/18/2012 01:19 PM 0.90 0.50 - 1.10 mg/dL Final  01/14/2012 08:05 PM 0.71 0.50 - 1.10 mg/dL Final  01/13/2012 09:40 AM 0.86 0.50 - 1.10 mg/dL Final  01/01/2012 03:51 PM 0.80 0.50 - 1.10 mg/dL Final  12/30/2011 01:10 PM 0.70 0.50 - 1.10 mg/dL Final  12/27/2011 06:10 PM 0.78 0.50 - 1.10 mg/dL Final  12/26/2011 08:45 AM 0.71 0.50 - 1.10 mg/dL Final  12/18/2011 08:13  PM 0.74 0.50 - 1.10 mg/dL Final  12/15/2011 10:22 AM 0.71 0.50 - 1.10 mg/dL Final  12/02/2011 08:50 AM 0.77 0.50 - 1.10 mg/dL Final  11/27/2011 05:45 PM 0.77 0.50 - 1.10 mg/dL Final  11/17/2011 07:33 PM 0.88 0.50 - 1.10 mg/dL Final  11/14/2011 07:30 PM 1.04 0.50 - 1.10 mg/dL Final  11/13/2011 06:20 PM 1.16* 0.50 - 1.10 mg/dL Final  09/13/2011 07:45 PM 0.91 0.50 - 1.10 mg/dL Final  09/11/2011 10:25 AM 0.80 0.50 - 1.10 mg/dL Final  08/01/2011 06:15 PM 0.70 0.50 - 1.10 mg/dL  Final  06/01/2011 02:24 PM 0.78 0.50 - 1.10 mg/dL Final    Recent Labs Lab 01/30/16 1221 01/31/16 1548  NA 136 139  K 4.4 4.1  CL 101 106  CO2 20 22  GLUCOSE 117* 99  BUN 43* 39*  CREATININE 5.39* 4.15*  CALCIUM 9.6 9.8    Recent Labs Lab 01/30/16 1221 01/31/16 1548  WBC 9.3 9.0  NEUTROABS 6789  --   HGB 12.0 12.3  HCT 36.3 37.5  MCV 88.1 87.6  PLT 231 267   Liver Function Tests:  Recent Labs Lab 01/30/16 1221 01/31/16 1548  AST 17 22  ALT 11 15  ALKPHOS 71 71  BILITOT 0.4 0.5  PROT 7.3 7.7  ALBUMIN 4.1 4.0    Recent Labs Lab 01/31/16 1548  LIPASE 31   No results for input(s): AMMONIA in the last 168 hours. Cardiac Enzymes: No results for input(s): CKTOTAL, CKMB, CKMBINDEX, TROPONINI in the last 168 hours. Iron Studies: No results for input(s): IRON, TIBC, TRANSFERRIN, FERRITIN in the last 72 hours. PT/INR: @LABRCNTIP (inr:5)  Xrays/Other Studies: ) Results for orders placed or performed during the hospital encounter of 01/31/16 (from the past 48 hour(s))  Urinalysis, Routine w reflex microscopic (not at [facility])     Status: Abnormal   Collection Time: 01/31/16  3:46 PM  Result Value Ref Range   Color, Urine YELLOW YELLOW   APPearance HAZY (A) CLEAR   Specific Gravity, Urine 1.012 1.005 - 1.030   pH 5.5 5.0 - 8.0   Glucose, UA NEGATIVE NEGATIVE mg/dL   Hgb urine dipstick NEGATIVE NEGATIVE   Bilirubin Urine NEGATIVE NEGATIVE   Ketones, ur NEGATIVE NEGATIVE mg/dL   Protein, ur NEGATIVE NEGATIVE mg/dL   Nitrite NEGATIVE NEGATIVE   Leukocytes, UA MODERATE (A) NEGATIVE  Urine microscopic-add on     Status: Abnormal   Collection Time: 01/31/16  3:46 PM  Result Value Ref Range   Squamous Epithelial / LPF 6-30 (A) NONE SEEN   WBC, UA 6-30 0 - 5 WBC/hpf   RBC / HPF 0-5 0 - 5 RBC/hpf   Bacteria, UA FEW (A) NONE SEEN  Lipase, blood     Status: None   Collection Time: 01/31/16  3:48 PM  Result Value Ref Range   Lipase 31 11 - 51 U/L  Comprehensive  metabolic panel     Status: Abnormal   Collection Time: 01/31/16  3:48 PM  Result Value Ref Range   Sodium 139 135 - 145 mmol/L   Potassium 4.1 3.5 - 5.1 mmol/L   Chloride 106 101 - 111 mmol/L   CO2 22 22 - 32 mmol/L   Glucose, Bld 99 65 - 99 mg/dL   BUN 39 (H) 6 - 20 mg/dL   Creatinine, Ser 4.15 (H) 0.44 - 1.00 mg/dL   Calcium 9.8 8.9 - 10.3 mg/dL   Total Protein 7.7 6.5 - 8.1 g/dL   Albumin 4.0 3.5 - 5.0 g/dL  AST 22 15 - 41 U/L   ALT 15 14 - 54 U/L   Alkaline Phosphatase 71 38 - 126 U/L   Total Bilirubin 0.5 0.3 - 1.2 mg/dL   GFR calc non Af Amer 11 (L) >60 mL/min   GFR calc Af Amer 12 (L) >60 mL/min    Comment: (NOTE) The eGFR has been calculated using the CKD EPI equation. This calculation has not been validated in all clinical situations. eGFR's persistently <60 mL/min signify possible Chronic Kidney Disease.    Anion gap 11 5 - 15  CBC     Status: None   Collection Time: 01/31/16  3:48 PM  Result Value Ref Range   WBC 9.0 4.0 - 10.5 K/uL   RBC 4.28 3.87 - 5.11 MIL/uL   Hemoglobin 12.3 12.0 - 15.0 g/dL   HCT 37.5 36.0 - 46.0 %   MCV 87.6 78.0 - 100.0 fL   MCH 28.7 26.0 - 34.0 pg   MCHC 32.8 30.0 - 36.0 g/dL   RDW 13.3 11.5 - 15.5 %   Platelets 267 150 - 400 K/uL  Lithium level     Status: None   Collection Time: 01/31/16  7:22 PM  Result Value Ref Range   Lithium Lvl 1.10 0.60 - 1.20 mmol/L   No results found.  PMH:   Past Medical History  Diagnosis Date  . Mental disorder   . Hepatitis   . Anxiety   . Depression   . Thyroid disease   . Hypothyroidism   . Prediabetes   . Labile hypertension     PSH:   Past Surgical History  Procedure Laterality Date  . Arm fracture    . Rotator cuff repair      Allergies:  Allergies  Allergen Reactions  . Sulfa Antibiotics Rash    Medications:   Prior to Admission medications   Medication Sig Start Date End Date Taking? Authorizing Provider  levothyroxine (SYNTHROID, LEVOTHROID) 75 MCG tablet TAKE 1  TABLET BY MOUTH EVERY DAY 12/26/15  Yes Unk Pinto, MD  lithium 300 MG tablet Take 300 mg by mouth at bedtime.    Yes Historical Provider, MD  sertraline (ZOLOFT) 100 MG tablet takes 1 tablet by mouth once daily 06/29/15  Yes Historical Provider, MD  Sofosbuvir-Velpatasvir (EPCLUSA) 400-100 MG TABS Take 1 tablet by mouth daily. Take one tablet by mouth daily for 12 weeks.   Yes Historical Provider, MD  thiothixene (NAVANE) 5 MG capsule Take 5 mg by mouth 2 (two) times daily.   Yes Historical Provider, MD  trihexyphenidyl (ARTANE) 5 MG tablet Take 5 mg by mouth at bedtime.   Yes Historical Provider, MD    Discontinued Meds:  There are no discontinued medications.  Social History:  reports that she has never smoked. She has never used smokeless tobacco. She reports that she does not drink alcohol or use illicit drugs.  Family History:   Family History  Problem Relation Age of Onset  . Hyperlipidemia Mother   . Hypertension Mother   . Colon polyps Mother   . Cancer Father     Lung  . Hyperlipidemia Brother     Blood pressure 138/76, pulse 73, temperature 98.7 F (37.1 C), resp. rate 18, height 5' 4"  (1.626 m), weight 82.356 kg (181 lb 9 oz), SpO2 98 %. General appearance: alert, cooperative and appears stated age Head: Normocephalic, without obvious abnormality, atraumatic Eyes: negative Neck: no adenopathy, no carotid bruit, no JVD, supple, symmetrical, trachea midline and thyroid  not enlarged, symmetric, no tenderness/mass/nodules Back: symmetric, no curvature. ROM normal. No CVA tenderness. Resp: clear to auscultation bilaterally Chest wall: no tenderness Cardio: regular rate and rhythm, S1, S2 normal, no murmur, click, rub or gallop GI: soft, non-tender; bowel sounds normal; no masses,  no organomegaly Extremities: extremities normal, atraumatic, no cyanosis or edema Pulses: 2+ and symmetric Skin: Skin color, texture, turgor normal. No rashes or lesions Lymph nodes: Cervical,  supraclavicular, and axillary nodes normal. Neurologic: Grossly normal       Georgeanna Radziewicz, Hunt Oris, MD 01/31/2016, 9:39 PM

## 2016-01-31 NOTE — ED Notes (Signed)
MD at bedside. 

## 2016-02-01 ENCOUNTER — Observation Stay (HOSPITAL_COMMUNITY): Payer: PPO

## 2016-02-01 DIAGNOSIS — B182 Chronic viral hepatitis C: Secondary | ICD-10-CM

## 2016-02-01 DIAGNOSIS — N170 Acute kidney failure with tubular necrosis: Secondary | ICD-10-CM | POA: Diagnosis not present

## 2016-02-01 DIAGNOSIS — F315 Bipolar disorder, current episode depressed, severe, with psychotic features: Secondary | ICD-10-CM | POA: Diagnosis not present

## 2016-02-01 DIAGNOSIS — E039 Hypothyroidism, unspecified: Secondary | ICD-10-CM | POA: Diagnosis present

## 2016-02-01 DIAGNOSIS — F419 Anxiety disorder, unspecified: Secondary | ICD-10-CM | POA: Diagnosis present

## 2016-02-01 DIAGNOSIS — B1920 Unspecified viral hepatitis C without hepatic coma: Secondary | ICD-10-CM | POA: Diagnosis present

## 2016-02-01 DIAGNOSIS — F259 Schizoaffective disorder, unspecified: Secondary | ICD-10-CM | POA: Diagnosis present

## 2016-02-01 DIAGNOSIS — N390 Urinary tract infection, site not specified: Secondary | ICD-10-CM | POA: Diagnosis present

## 2016-02-01 DIAGNOSIS — R1011 Right upper quadrant pain: Secondary | ICD-10-CM | POA: Diagnosis not present

## 2016-02-01 DIAGNOSIS — I10 Essential (primary) hypertension: Secondary | ICD-10-CM | POA: Diagnosis not present

## 2016-02-01 DIAGNOSIS — R109 Unspecified abdominal pain: Secondary | ICD-10-CM | POA: Diagnosis present

## 2016-02-01 DIAGNOSIS — N179 Acute kidney failure, unspecified: Principal | ICD-10-CM

## 2016-02-01 DIAGNOSIS — R7303 Prediabetes: Secondary | ICD-10-CM | POA: Diagnosis present

## 2016-02-01 DIAGNOSIS — Z6831 Body mass index (BMI) 31.0-31.9, adult: Secondary | ICD-10-CM | POA: Diagnosis not present

## 2016-02-01 DIAGNOSIS — R197 Diarrhea, unspecified: Secondary | ICD-10-CM | POA: Diagnosis not present

## 2016-02-01 LAB — GLUCOSE, CAPILLARY
Glucose-Capillary: 105 mg/dL — ABNORMAL HIGH (ref 65–99)
Glucose-Capillary: 102 mg/dL — ABNORMAL HIGH (ref 65–99)

## 2016-02-01 LAB — BASIC METABOLIC PANEL
Anion gap: 11 (ref 5–15)
BUN: 34 mg/dL — AB (ref 6–20)
CHLORIDE: 107 mmol/L (ref 101–111)
CO2: 20 mmol/L — AB (ref 22–32)
CREATININE: 3.5 mg/dL — AB (ref 0.44–1.00)
Calcium: 8.6 mg/dL — ABNORMAL LOW (ref 8.9–10.3)
GFR calc Af Amer: 15 mL/min — ABNORMAL LOW (ref >60)
GFR calc non Af Amer: 13 mL/min — ABNORMAL LOW (ref >60)
Glucose, Bld: 94 mg/dL (ref 65–99)
Potassium: 3.9 mmol/L (ref 3.5–5.1)
Sodium: 138 mmol/L (ref 135–145)

## 2016-02-01 LAB — CBC
HEMATOCRIT: 32.4 % — AB (ref 36.0–46.0)
Hemoglobin: 10.5 g/dL — ABNORMAL LOW (ref 12.0–15.0)
MCH: 28.5 pg (ref 26.0–34.0)
MCHC: 32.4 g/dL (ref 30.0–36.0)
MCV: 88 fL (ref 78.0–100.0)
PLATELETS: 206 10*3/uL (ref 150–400)
RBC: 3.68 MIL/uL — ABNORMAL LOW (ref 3.87–5.11)
RDW: 13.4 % (ref 11.5–15.5)
WBC: 9 10*3/uL (ref 4.0–10.5)

## 2016-02-01 LAB — C DIFFICILE QUICK SCREEN W PCR REFLEX
C DIFFICILE (CDIFF) INTERP: NEGATIVE
C Diff antigen: NEGATIVE
C Diff toxin: NEGATIVE

## 2016-02-01 NOTE — Care Management Obs Status (Signed)
[facility] NOTIFICATION   Patient Details  Name: Rebekah Pace MRN: [id number] Date of Birth: [birth year]   Medicare Observation Status Notification Given:  Yes    Laquetta Racey, Rory Percy, RN 02/01/2016, 4:45 PM

## 2016-02-01 NOTE — Progress Notes (Signed)
[facility] ROUNDING NOTE   Subjective:   Interval History: urine output improved  Objective:  Vital signs in last 24 hours:  Temp:  [98.6 F (37 C)-98.8 F (37.1 C)] 98.8 F (37.1 C) (04/20 0914) Pulse Rate:  [68-77] 71 (04/20 0914) Resp:  [18-22] 20 (04/20 0914) BP: (123-158)/(62-84) 134/67 mmHg (04/20 0914) SpO2:  [97 %-100 %] 97 % (04/20 0914) Weight:  [82.356 kg (181 lb 9 oz)-83.3 kg (183 lb 10.3 oz)] 83.3 kg (183 lb 10.3 oz) (04/19 2145)  Weight change:  Filed Weights   01/31/16 1544 01/31/16 2145  Weight: 82.356 kg (181 lb 9 oz) 83.3 kg (183 lb 10.3 oz)    Intake/Output:     Intake/Output this shift:  Total I/O In: 1828.8 [P.O.:360; I.V.:1468.8] Out: 0   CVS- RRR RS- CTA ABD- BS present soft non-distended EXT- no edema   Basic Metabolic Panel:  Recent Labs Lab 01/30/16 1221 01/31/16 1548 02/01/16 0314  NA 136 139 138  K 4.4 4.1 3.9  CL 101 106 107  CO2 20 22 20*  GLUCOSE 117* 99 94  BUN 43* 39* 34*  CREATININE 5.39* 4.15* 3.50*  CALCIUM 9.6 9.8 8.6*  MG 2.1  --   --     Liver Function Tests:  Recent Labs Lab 01/30/16 1221 01/31/16 1548  AST 17 22  ALT 11 15  ALKPHOS 71 71  BILITOT 0.4 0.5  PROT 7.3 7.7  ALBUMIN 4.1 4.0    Recent Labs Lab 01/31/16 1548  LIPASE 31   No results for input(s): AMMONIA in the last 168 hours.  CBC:  Recent Labs Lab 01/30/16 1221 01/31/16 1548 02/01/16 0314  WBC 9.3 9.0 9.0  NEUTROABS 6789  --   --   HGB 12.0 12.3 10.5*  HCT 36.3 37.5 32.4*  MCV 88.1 87.6 88.0  PLT 231 267 206    Cardiac Enzymes:  Recent Labs Lab 01/31/16 2120  CKTOTAL 33*    BNP: Invalid input(s): POCBNP  CBG:  Recent Labs Lab 02/01/16 0910  GLUCAP 105*    Microbiology: Results for orders placed or performed in visit on 01/17/16  Urine culture     Status: None   Collection Time: 01/17/16  2:30 PM  Result Value Ref Range Status   Colony Count 40,000 COLONIES/ML  Final   Organism ID,  Bacteria Multiple bacterial morphotypes present, none  Final   Organism ID, Bacteria predominant. Suggest appropriate recollection if   Final   Organism ID, Bacteria clinically indicated.  Final    Coagulation Studies:  Recent Labs  01/31/16 2120  LABPROT 16.7*  INR 1.34    Urinalysis:  Recent Labs  01/31/16 1546  COLORURINE YELLOW  LABSPEC 1.012  PHURINE 5.5  GLUCOSEU NEGATIVE  HGBUR NEGATIVE  BILIRUBINUR NEGATIVE  KETONESUR NEGATIVE  PROTEINUR NEGATIVE  NITRITE NEGATIVE  LEUKOCYTESUR MODERATE*      Imaging: No results found.   Medications:   . sodium chloride 125 mL/hr at 02/01/16 0907   . cefTRIAXone (ROCEPHIN)  IV  1 g Intravenous Q24H  . heparin  5,000 Units Subcutaneous Q8H  . levothyroxine  75 mcg Oral QAC breakfast  . lithium carbonate  300 mg Oral QHS  . sertraline  100 mg Oral Daily  . Sofosbuvir-Velpatasvir  1 tablet Oral Q24H  . thiothixene  5 mg Oral BID  . trihexyphenidyl  5 mg Oral QHS   morphine injection  Assessment/ Plan:   Acute kidney injury  Creatinine improved with the use of  IV fluids  No indications for dialysis  Volume status controlled and hemodynamics stable   LOS:  Vannessa [place] W @TODAY @11 :09 AM

## 2016-02-01 NOTE — Progress Notes (Signed)
PROGRESS NOTE    Rebekah Pace  [id number] DOB: [birth year] DOA: 01/31/2016 PCP: Alesia Richards, MD   Outpatient Specialists:     Brief Narrative:     Assessment & Plan:   Principal Problem:   AKI (acute kidney injury) ([place]) Active Problems:   Depression, controlled   Prediabetes   Labile hypertension   Bipolar affective disorder, depressed, severe, with psychotic behavior ([place])   Hypothyroidism   Morbid obesity ([place])  (BMI 31.84)   HCV (hepatitis C virus)   Diarrhea   Abdominal pain   AKI (acute kidney injury) ([place]):  -differential diagnosis includes prerenal azotemia, ATN, CIN. Though sofosbuvir component of Epclusa can cause rhabdomyolysis, but clinically pt doesn't appear to be rhabdo per Dr. Augustin Coupe. No renal toxic agents in her MAR; she denies any NSAID use or herbal remedies. -Aggressive isotonic fluid resuscitation per Dr. Augustin Coupe. IV NS, 1L bolus and then 125 cc/h -per Dr. Augustin Coupe, may benefit from a renal biopsy to define why she has had chronic leukocyturia especially if the urine culture is negative; could this be chronic interstitial nephritis? -check FeNA -pending CK level  Possible UTI: Patient has positive urinalysis with moderate amount of leukocytes. She has a mildly increased urine frequency, indicating possible UTI.  -IV Rocephin started -Follow-up urine culture  Diarrhea: Etiology is not clear. We rule out C. difficile colitis -Check C. difficile PCR and GI pathogen panel -IV fluid as above -Appearing Zofran for nausea and morphine for pain  Abdominal pain over right upper quadrant: Most likely due to hepatitis C. -u/s shows fatty infiltration  HCV - on Epclusa. -will continue Epclusa  Schizo affective and manic bipolar disorder: on Lithium with therapeutic level -continue Lithium, Artane and Thiothixene  Hypothyroidism: Last TSH was 4.04 on 4/18/617 -Continue home Synthroid  Depression and anxiety: Stable, no suicidal or homicidal  ideations. -Continue home medications: Zolot    DVT prophylaxis:  SQ Heparin  Code Status: Full Code   Family Communication:   Disposition Plan:     Consultants:   renal     Subjective: No SOB, no CP, some nausea still but hungry  Objective: Filed Vitals:   01/31/16 2100 01/31/16 2145 02/01/16 0502 02/01/16 0914  BP: 138/76 123/77 143/78 134/67  Pulse: 73 72 77 71  Temp:  98.6 F (37 C) 98.7 F (37.1 C) 98.8 F (37.1 C)  TempSrc:  Oral  Oral  Resp:  22 21 20   Height:      Weight:  83.3 kg (183 lb 10.3 oz)    SpO2: 98% 99% 98% 97%    Intake/Output Summary (Last 24 hours) at 02/01/16 1149 Last data filed at 02/01/16 1031  Gross per 24 hour  Intake 1828.75 ml  Output      0 ml  Net 1828.75 ml   Filed Weights   01/31/16 1544 01/31/16 2145  Weight: 82.356 kg (181 lb 9 oz) 83.3 kg (183 lb 10.3 oz)    Examination:  General exam: Appears calm and comfortable  Respiratory system: Clear to auscultation. Respiratory effort normal. Cardiovascular system: S1 & S2 heard, RRR. No JVD, murmurs, rubs, gallops or clicks. No pedal edema. Gastrointestinal system: Abdomen is nondistended, soft and nontender. No organomegaly or masses felt. Normal bowel sounds heard. Central nervous system: Alert and oriented. No focal neurological deficits. Extremities: Symmetric 5 x 5 power. Skin: No rashes, lesions or ulcers Psychiatry: Judgement and insight appear normal. Mood & affect appropriate.     Data Reviewed: I have personally reviewed  following labs and imaging studies  CBC:  Recent Labs Lab 01/30/16 1221 01/31/16 1548 02/01/16 0314  WBC 9.3 9.0 9.0  NEUTROABS 6789  --   --   HGB 12.0 12.3 10.5*  HCT 36.3 37.5 32.4*  MCV 88.1 87.6 88.0  PLT 231 267 [id number]   Basic Metabolic Panel:  Recent Labs Lab 01/30/16 1221 01/31/16 1548 02/01/16 0314  NA 136 139 138  K 4.4 4.1 3.9  CL 101 106 107  CO2 20 22 20*  GLUCOSE 117* 99 94  BUN 43* 39* 34*  CREATININE  5.39* 4.15* 3.50*  CALCIUM 9.6 9.8 8.6*  MG 2.1  --   --    GFR: Estimated Creatinine Clearance: 17.8 mL/min (by C-G formula based on Cr of 3.5). Liver Function Tests:  Recent Labs Lab 01/30/16 1221 01/31/16 1548  AST 17 22  ALT 11 15  ALKPHOS 71 71  BILITOT 0.4 0.5  PROT 7.3 7.7  ALBUMIN 4.1 4.0    Recent Labs Lab 01/31/16 1548  LIPASE 31   No results for input(s): AMMONIA in the last 168 hours. Coagulation Profile:  Recent Labs Lab 01/31/16 2120  INR 1.34   Cardiac Enzymes:  Recent Labs Lab 01/31/16 2120  CKTOTAL 33*   BNP (last 3 results) No results for input(s): PROBNP in the last 8760 hours. HbA1C:  Recent Labs  01/30/16 1221  HGBA1C 4.8   CBG:  Recent Labs Lab 02/01/16 0910  GLUCAP 105*   Lipid Profile:  Recent Labs  01/30/16 1221  CHOL 180  HDL 24*  LDLCALC 110  TRIG 228*  CHOLHDL 7.5*   Thyroid Function Tests:  Recent Labs  01/30/16 1221  TSH 4.04   Anemia Panel: No results for input(s): VITAMINB12, FOLATE, FERRITIN, TIBC, IRON, RETICCTPCT in the last 72 hours. Urine analysis:    Component Value Date/Time   COLORURINE YELLOW 01/31/2016 1546   APPEARANCEUR HAZY* 01/31/2016 1546   LABSPEC 1.012 01/31/2016 1546   PHURINE 5.5 01/31/2016 1546   GLUCOSEU NEGATIVE 01/31/2016 1546   HGBUR NEGATIVE 01/31/2016 1546   BILIRUBINUR NEGATIVE 01/31/2016 1546   KETONESUR NEGATIVE 01/31/2016 1546   PROTEINUR NEGATIVE 01/31/2016 1546   UROBILINOGEN 0.2 02/09/2012 0244   NITRITE NEGATIVE 01/31/2016 1546   LEUKOCYTESUR MODERATE* 01/31/2016 1546   )No results found for this or any previous visit (from the past 240 hour(s)).    Anti-infectives    Start     Dose/Rate Route Frequency Ordered Stop   02/01/16 2100  cefTRIAXone (ROCEPHIN) 1 g in dextrose 5 % 50 mL IVPB     1 g 100 mL/hr over 30 Minutes Intravenous Every 24 hours 01/31/16 2141     02/01/16 1000  Sofosbuvir-Velpatasvir 400-100 MG TABS 1 tablet     1 tablet Oral Every 24  hours 01/31/16 2050     01/31/16 2100  cefTRIAXone (ROCEPHIN) 1 g in dextrose 5 % 50 mL IVPB  Status:  Discontinued     1 g 100 mL/hr over 30 Minutes Intravenous Every 24 hours 01/31/16 2050 01/31/16 2141   01/31/16 1845  cefTRIAXone (ROCEPHIN) 1 g in dextrose 5 % 50 mL IVPB     1 g 100 mL/hr over 30 Minutes Intravenous  Once 01/31/16 1838 01/31/16 2011       Radiology Studies: US Abdomen Complete  02/01/2016  CLINICAL DATA:  Right upper quadrant abdominal pain for 1 week, diarrhea. EXAM: ABDOMEN ULTRASOUND COMPLETE COMPARISON:  Ultrasound of October 11, 2015. FINDINGS: Gallbladder: No gallstones or wall  thickening visualized. No sonographic Murphy sign noted by sonographer. 4 mm gallbladder polyp is noted. Common bile duct: Diameter: 4 mm which is within normal limits. Liver: No focal lesion identified. Hepatic parenchyma is slightly increased and heterogeneous in echotexture suggesting fatty infiltration or other diffuse hepatocellular disease. IVC: No abnormality visualized. Pancreas: Visualized portion unremarkable. Spleen: Size and appearance within normal limits. Right Kidney: Length: 11.3 cm. Echogenicity within normal limits. No mass or hydronephrosis visualized. Left Kidney: Length: 10.8 cm. Echogenicity within normal limits. No mass or hydronephrosis visualized. Abdominal aorta: No aneurysm visualized. Other findings: None. IMPRESSION: Slightly increased and heterogeneous echotexture of hepatic parenchyma suggesting fatty infiltration or other diffuse hepatocellular disease. No other abnormality seen in the abdomen. Electronically Signed   By: Marijo Conception, M.D.   On: 02/01/2016 11:31        Scheduled Meds: . cefTRIAXone (ROCEPHIN)  IV  1 g Intravenous Q24H  . heparin  5,000 Units Subcutaneous Q8H  . levothyroxine  75 mcg Oral QAC breakfast  . lithium carbonate  300 mg Oral QHS  . sertraline  100 mg Oral Daily  . Sofosbuvir-Velpatasvir  1 tablet Oral Q24H  . thiothixene  5 mg  Oral BID  . trihexyphenidyl  5 mg Oral QHS   Continuous Infusions: . sodium chloride 125 mL/hr at 02/01/16 [id number]        Time spent: 25 min    [place], DO [facility] Pager [phone]  If 7PM-7AM, please contact night-coverage [email or web address] Password [email or web address] 02/01/2016, 11:49 AM

## 2016-02-02 ENCOUNTER — Other Ambulatory Visit: Payer: Self-pay | Admitting: Internal Medicine

## 2016-02-02 DIAGNOSIS — R197 Diarrhea, unspecified: Secondary | ICD-10-CM

## 2016-02-02 LAB — RENAL FUNCTION PANEL
ANION GAP: 9 (ref 5–15)
Albumin: 3.2 g/dL — ABNORMAL LOW (ref 3.5–5.0)
BUN: 23 mg/dL — AB (ref 6–20)
CHLORIDE: 113 mmol/L — AB (ref 101–111)
CO2: 17 mmol/L — ABNORMAL LOW (ref 22–32)
Calcium: 8.9 mg/dL (ref 8.9–10.3)
Creatinine, Ser: 2.3 mg/dL — ABNORMAL HIGH (ref 0.44–1.00)
GFR calc Af Amer: 25 mL/min — ABNORMAL LOW (ref >60)
GFR calc non Af Amer: 22 mL/min — ABNORMAL LOW (ref >60)
GLUCOSE: 107 mg/dL — AB (ref 65–99)
PHOSPHORUS: 3.3 mg/dL (ref 2.5–4.6)
POTASSIUM: 4.3 mmol/L (ref 3.5–5.1)
Sodium: 139 mmol/L (ref 135–145)

## 2016-02-02 LAB — URINE CULTURE

## 2016-02-02 LAB — GASTROINTESTINAL PANEL BY PCR, STOOL (REPLACES STOOL CULTURE)
Adenovirus F40/41: NOT DETECTED
Astrovirus: NOT DETECTED
CRYPTOSPORIDIUM: NOT DETECTED
CYCLOSPORA CAYETANENSIS: NOT DETECTED
Campylobacter species: NOT DETECTED
E. coli O157: NOT DETECTED
ENTAMOEBA HISTOLYTICA: NOT DETECTED
ENTEROAGGREGATIVE E COLI (EAEC): NOT DETECTED
Enteropathogenic E coli (EPEC): NOT DETECTED
Enterotoxigenic E coli (ETEC): NOT DETECTED
GIARDIA LAMBLIA: NOT DETECTED
Norovirus GI/GII: NOT DETECTED
Plesimonas shigelloides: NOT DETECTED
Rotavirus A: NOT DETECTED
SALMONELLA SPECIES: NOT DETECTED
SHIGELLA/ENTEROINVASIVE E COLI (EIEC): NOT DETECTED
Sapovirus (I, II, IV, and V): NOT DETECTED
Shiga like toxin producing E coli (STEC): NOT DETECTED
VIBRIO CHOLERAE: NOT DETECTED
VIBRIO SPECIES: NOT DETECTED
YERSINIA ENTEROCOLITICA: NOT DETECTED

## 2016-02-02 LAB — GLUCOSE, CAPILLARY: Glucose-Capillary: 128 mg/dL — ABNORMAL HIGH (ref 65–99)

## 2016-02-02 LAB — CBC
HEMATOCRIT: 32.8 % — AB (ref 36.0–46.0)
HEMOGLOBIN: 10.6 g/dL — AB (ref 12.0–15.0)
MCH: 29 pg (ref 26.0–34.0)
MCHC: 32.3 g/dL (ref 30.0–36.0)
MCV: 89.9 fL (ref 78.0–100.0)
Platelets: 207 10*3/uL (ref 150–400)
RBC: 3.65 MIL/uL — ABNORMAL LOW (ref 3.87–5.11)
RDW: 13.4 % (ref 11.5–15.5)
WBC: 8.4 10*3/uL (ref 4.0–10.5)

## 2016-02-02 NOTE — Progress Notes (Signed)
[facility] ROUNDING NOTE   Subjective:   Interval History: continues to improve   Objective:  Vital signs in last 24 hours:  Temp:  [98.3 F (36.8 C)-99.3 F (37.4 C)] 98.5 F (36.9 C) (04/21 0823) Pulse Rate:  [68-108] 68 (04/21 0823) Resp:  [16-19] 17 (04/21 0823) BP: (96-127)/(43-60) 127/56 mmHg (04/21 0823) SpO2:  [97 %-100 %] 97 % (04/21 0823)  Weight change:  Filed Weights   01/31/16 1544 01/31/16 2145  Weight: 82.356 kg (181 lb 9 oz) 83.3 kg (183 lb 10.3 oz)    Intake/Output: I/O last 3 completed shifts: In: 3433.8 [P.O.:840; I.V.:2593.8] Out: 0    Intake/Output this shift:     CVS- RRR RS- CTA ABD- BS present soft non-distended EXT- no edema   Basic Metabolic Panel:  Recent Labs Lab 01/30/16 1221 01/31/16 1548 02/01/16 0314 02/02/16 0929  NA 136 139 138 139  K 4.4 4.1 3.9 4.3  CL 101 106 107 113*  CO2 20 22 20* 17*  GLUCOSE 117* 99 94 107*  BUN 43* 39* 34* 23*  CREATININE 5.39* 4.15* 3.50* 2.30*  CALCIUM 9.6 9.8 8.6* 8.9  MG 2.1  --   --   --   PHOS  --   --   --  3.3    Liver Function Tests:  Recent Labs Lab 01/30/16 1221 01/31/16 1548 02/02/16 0929  AST 17 22  --   ALT 11 15  --   ALKPHOS 71 71  --   BILITOT 0.4 0.5  --   PROT 7.3 7.7  --   ALBUMIN 4.1 4.0 3.2*    Recent Labs Lab 01/31/16 1548  LIPASE 31   No results for input(s): AMMONIA in the last 168 hours.  CBC:  Recent Labs Lab 01/30/16 1221 01/31/16 1548 02/01/16 0314 02/02/16 0929  WBC 9.3 9.0 9.0 8.4  NEUTROABS 6789  --   --   --   HGB 12.0 12.3 10.5* 10.6*  HCT 36.3 37.5 32.4* 32.8*  MCV 88.1 87.6 88.0 89.9  PLT 231 267 206 207    Cardiac Enzymes:  Recent Labs Lab 01/31/16 2120  CKTOTAL 33*    BNP: Invalid input(s): POCBNP  CBG:  Recent Labs Lab 02/01/16 0910 02/01/16 2045 02/02/16 0821  GLUCAP 105* 102* 128*    Microbiology: Results for orders placed or performed during the hospital encounter of 01/31/16  Urine  culture     Status: None   Collection Time: 01/31/16  3:46 PM  Result Value Ref Range Status   Specimen Description URINE, CLEAN CATCH  Final   Special Requests NONE  Final   Culture MULTIPLE SPECIES PRESENT, SUGGEST RECOLLECTION  Final   Report Status 02/02/2016 FINAL  Final  C difficile quick scan w PCR reflex     Status: None   Collection Time: 02/01/16  1:33 PM  Result Value Ref Range Status   C Diff antigen NEGATIVE NEGATIVE Final   C Diff toxin NEGATIVE NEGATIVE Final   C Diff interpretation Negative for toxigenic C. difficile  Final    Coagulation Studies:  Recent Labs  01/31/16 2120  LABPROT 16.7*  INR 1.34    Urinalysis:  Recent Labs  01/31/16 1546  COLORURINE YELLOW  LABSPEC 1.012  PHURINE 5.5  GLUCOSEU NEGATIVE  HGBUR NEGATIVE  BILIRUBINUR NEGATIVE  KETONESUR NEGATIVE  PROTEINUR NEGATIVE  NITRITE NEGATIVE  LEUKOCYTESUR MODERATE*      Imaging: US Abdomen Complete  02/01/2016  CLINICAL DATA:  Right upper quadrant abdominal  pain for 1 week, diarrhea. EXAM: ABDOMEN ULTRASOUND COMPLETE COMPARISON:  Ultrasound of October 11, 2015. FINDINGS: Gallbladder: No gallstones or wall thickening visualized. No sonographic Murphy sign noted by sonographer. 4 mm gallbladder polyp is noted. Common bile duct: Diameter: 4 mm which is within normal limits. Liver: No focal lesion identified. Hepatic parenchyma is slightly increased and heterogeneous in echotexture suggesting fatty infiltration or other diffuse hepatocellular disease. IVC: No abnormality visualized. Pancreas: Visualized portion unremarkable. Spleen: Size and appearance within normal limits. Right Kidney: Length: 11.3 cm. Echogenicity within normal limits. No mass or hydronephrosis visualized. Left Kidney: Length: 10.8 cm. Echogenicity within normal limits. No mass or hydronephrosis visualized. Abdominal aorta: No aneurysm visualized. Other findings: None. IMPRESSION: Slightly increased and heterogeneous echotexture of  hepatic parenchyma suggesting fatty infiltration or other diffuse hepatocellular disease. No other abnormality seen in the abdomen. Electronically Signed   By: Marijo Conception, M.D.   On: 02/01/2016 11:31     Medications:   . sodium chloride 125 mL/hr at 02/01/16 1654   . cefTRIAXone (ROCEPHIN)  IV  1 g Intravenous Q24H  . heparin  5,000 Units Subcutaneous Q8H  . levothyroxine  75 mcg Oral QAC breakfast  . lithium carbonate  300 mg Oral QHS  . sertraline  100 mg Oral Daily  . Sofosbuvir-Velpatasvir  1 tablet Oral Q24H  . thiothixene  5 mg Oral BID  . trihexyphenidyl  5 mg Oral QHS   morphine injection  Assessment/ Plan:   Acute kidney injury Creatinine improved with the use of IV fluids  No indications for dialysis  Volume status controlled and hemodynamics stable  She has continued on  Lithium and will follow creatinine although it appears that this is improving     LOS: 1 Rebekah Pace W @TODAY @10 :49 AM

## 2016-02-02 NOTE — Progress Notes (Signed)
PROGRESS NOTE    Rebekah Pace  [id number] DOB: November 15, 1955 DOA: 01/31/2016 PCP: Alesia Richards, MD   Outpatient Specialists:     Brief Narrative:     Assessment & Plan:   Principal Problem:   AKI (acute kidney injury) ([place]) Active Problems:   Depression, controlled   Prediabetes   Labile hypertension   Bipolar affective disorder, depressed, severe, with psychotic behavior ([place])   Hypothyroidism   Morbid obesity ([place])  (BMI 31.84)   HCV (hepatitis C virus)   Diarrhea   Abdominal pain   AKI (acute kidney injury) ([place]):  Most likely due to diarrhea -differential diagnosis includes prerenal azotemia, ATN, CIN. Though sofosbuvir component of Epclusa can cause rhabdomyolysis, but clinically pt doesn't appear to be rhabdo per Dr. Augustin Coupe. No renal toxic agents in her MAR; she denies any NSAID use or herbal remedies. -Aggressive isotonic fluid resuscitation per Dr. Augustin Coupe. IV NS, 1L bolus and then 125 cc/h -per Dr. Augustin Coupe, may benefit from a renal biopsy to define why she has had chronic leukocyturia especially if the urine culture is negative; could this be chronic interstitial nephritis?-- defer to renal - CK: 33  Possible UTI: Patient has positive urinalysis with moderate amount of leukocytes. She has a mildly increased urine frequency, indicating possible UTI.  -IV Rocephin - will treat for 3 days and then d/c -culture NGTD   Diarrhea: Etiology is not clear. We rule out C. difficile colitis - C. difficile PCR negative and GI pathogen panel pending -IV fluid as above  Abdominal pain over right upper quadrant: Most likely due to hepatitis C. -u/s shows fatty infiltration  HCV - on Epclusa. -will continue Epclusa  Schizo affective and manic bipolar disorder: on Lithium with therapeutic level -continue Lithium, Artane and Thiothixene  Hypothyroidism: Last TSH was 4.04 on 4/18/617 -Continue home Synthroid  Depression and anxiety: Stable, no suicidal or homicidal  ideations. -Continue home medications: Zolot    DVT prophylaxis:  SQ Heparin  Code Status: Full Code   Family Communication:   Disposition Plan:     Consultants:   renal     Subjective: Feeling better  Objective: Filed Vitals:   02/01/16 1705 02/01/16 2122 02/02/16 0552 02/02/16 0823  BP: 120/60 96/43  127/56  Pulse: 108 69 82 68  Temp: 99.3 F (37.4 C) 99.1 F (37.3 C) 98.3 F (36.8 C) 98.5 F (36.9 C)  TempSrc: Oral Oral Oral Oral  Resp: 19 19 16 17   Height:      Weight:      SpO2: 98% 98% 100% 97%    Intake/Output Summary (Last 24 hours) at 02/02/16 1208 Last data filed at 02/01/16 1900  Gross per 24 hour  Intake   1605 ml  Output      0 ml  Net   1605 ml   Filed Weights   01/31/16 1544 01/31/16 2145  Weight: 82.356 kg (181 lb 9 oz) 83.3 kg (183 lb 10.3 oz)    Examination:  General exam: Appears calm and comfortable  Respiratory system: Clear to auscultation. Respiratory effort normal. Cardiovascular system: S1 & S2 heard, RRR. No JVD, murmurs, rubs, gallops or clicks. No pedal edema. Gastrointestinal system: Abdomen is nondistended, soft and nontender. No organomegaly or masses felt. Normal bowel sounds heard. Psychiatry: Judgement and insight appear normal. Mood & affect appropriate.     Data Reviewed: I have personally reviewed following labs and imaging studies  CBC:  Recent Labs Lab 01/30/16 1221 01/31/16 1548 02/01/16 0314 02/02/16 [id number]  WBC 9.3 9.0 9.0 8.4  NEUTROABS 6789  --   --   --   HGB 12.0 12.3 10.5* 10.6*  HCT 36.3 37.5 32.4* 32.8*  MCV 88.1 87.6 88.0 89.9  PLT 231 267 206 [id number]   Basic Metabolic Panel:  Recent Labs Lab 01/30/16 1221 01/31/16 1548 02/01/16 0314 02/02/16 0929  NA 136 139 138 139  K 4.4 4.1 3.9 4.3  CL 101 106 107 113*  CO2 20 22 20* 17*  GLUCOSE 117* 99 94 107*  BUN 43* 39* 34* 23*  CREATININE 5.39* 4.15* 3.50* 2.30*  CALCIUM 9.6 9.8 8.6* 8.9  MG 2.1  --   --   --   PHOS  --   --   --   3.3   GFR: Estimated Creatinine Clearance: 27.1 mL/min (by C-G formula based on Cr of 2.3). Liver Function Tests:  Recent Labs Lab 01/30/16 1221 01/31/16 1548 02/02/16 0929  AST 17 22  --   ALT 11 15  --   ALKPHOS 71 71  --   BILITOT 0.4 0.5  --   PROT 7.3 7.7  --   ALBUMIN 4.1 4.0 3.2*    Recent Labs Lab 01/31/16 1548  LIPASE 31   No results for input(s): AMMONIA in the last 168 hours. Coagulation Profile:  Recent Labs Lab 01/31/16 2120  INR 1.34   Cardiac Enzymes:  Recent Labs Lab 01/31/16 2120  CKTOTAL 33*   BNP (last 3 results) No results for input(s): PROBNP in the last 8760 hours. HbA1C:  Recent Labs  01/30/16 1221  HGBA1C 4.8   CBG:  Recent Labs Lab 02/01/16 0910 02/01/16 2045 02/02/16 0821  GLUCAP 105* 102* 128*   Lipid Profile:  Recent Labs  01/30/16 1221  CHOL 180  HDL 24*  LDLCALC 110  TRIG 228*  CHOLHDL 7.5*   Thyroid Function Tests:  Recent Labs  01/30/16 1221  TSH 4.04   Anemia Panel: No results for input(s): VITAMINB12, FOLATE, FERRITIN, TIBC, IRON, RETICCTPCT in the last 72 hours. Urine analysis:    Component Value Date/Time   COLORURINE YELLOW 01/31/2016 1546   APPEARANCEUR HAZY* 01/31/2016 1546   LABSPEC 1.012 01/31/2016 1546   PHURINE 5.5 01/31/2016 1546   GLUCOSEU NEGATIVE 01/31/2016 1546   HGBUR NEGATIVE 01/31/2016 1546   BILIRUBINUR NEGATIVE 01/31/2016 1546   KETONESUR NEGATIVE 01/31/2016 1546   PROTEINUR NEGATIVE 01/31/2016 1546   UROBILINOGEN 0.2 02/09/2012 0244   NITRITE NEGATIVE 01/31/2016 1546   LEUKOCYTESUR MODERATE* 01/31/2016 1546   ) Recent Results (from the past 240 hour(s))  Urine culture     Status: None   Collection Time: 01/31/16  3:46 PM  Result Value Ref Range Status   Specimen Description URINE, CLEAN CATCH  Final   Special Requests NONE  Final   Culture MULTIPLE SPECIES PRESENT, SUGGEST RECOLLECTION  Final   Report Status 02/02/2016 FINAL  Final  C difficile quick scan w PCR  reflex     Status: None   Collection Time: 02/01/16  1:33 PM  Result Value Ref Range Status   C Diff antigen NEGATIVE NEGATIVE Final   C Diff toxin NEGATIVE NEGATIVE Final   C Diff interpretation Negative for toxigenic C. difficile  Final      Anti-infectives    Start     Dose/Rate Route Frequency Ordered Stop   02/01/16 2100  cefTRIAXone (ROCEPHIN) 1 g in dextrose 5 % 50 mL IVPB     1 g 100 mL/hr over 30 Minutes Intravenous  Every 24 hours 01/31/16 2141     02/01/16 1000  Sofosbuvir-Velpatasvir 400-100 MG TABS 1 tablet     1 tablet Oral Every 24 hours 01/31/16 2050     01/31/16 2100  cefTRIAXone (ROCEPHIN) 1 g in dextrose 5 % 50 mL IVPB  Status:  Discontinued     1 g 100 mL/hr over 30 Minutes Intravenous Every 24 hours 01/31/16 2050 01/31/16 2141   01/31/16 1845  cefTRIAXone (ROCEPHIN) 1 g in dextrose 5 % 50 mL IVPB     1 g 100 mL/hr over 30 Minutes Intravenous  Once 01/31/16 1838 01/31/16 2011       Radiology Studies: US Abdomen Complete  02/01/2016  CLINICAL DATA:  Right upper quadrant abdominal pain for 1 week, diarrhea. EXAM: ABDOMEN ULTRASOUND COMPLETE COMPARISON:  Ultrasound of October 11, 2015. FINDINGS: Gallbladder: No gallstones or wall thickening visualized. No sonographic Murphy sign noted by sonographer. 4 mm gallbladder polyp is noted. Common bile duct: Diameter: 4 mm which is within normal limits. Liver: No focal lesion identified. Hepatic parenchyma is slightly increased and heterogeneous in echotexture suggesting fatty infiltration or other diffuse hepatocellular disease. IVC: No abnormality visualized. Pancreas: Visualized portion unremarkable. Spleen: Size and appearance within normal limits. Right Kidney: Length: 11.3 cm. Echogenicity within normal limits. No mass or hydronephrosis visualized. Left Kidney: Length: 10.8 cm. Echogenicity within normal limits. No mass or hydronephrosis visualized. Abdominal aorta: No aneurysm visualized. Other findings: None. IMPRESSION:  Slightly increased and heterogeneous echotexture of hepatic parenchyma suggesting fatty infiltration or other diffuse hepatocellular disease. No other abnormality seen in the abdomen. Electronically Signed   By: Marijo Conception, M.D.   On: 02/01/2016 11:31        Scheduled Meds: . cefTRIAXone (ROCEPHIN)  IV  1 g Intravenous Q24H  . heparin  5,000 Units Subcutaneous Q8H  . levothyroxine  75 mcg Oral QAC breakfast  . lithium carbonate  300 mg Oral QHS  . sertraline  100 mg Oral Daily  . Sofosbuvir-Velpatasvir  1 tablet Oral Q24H  . thiothixene  5 mg Oral BID  . trihexyphenidyl  5 mg Oral QHS   Continuous Infusions: . sodium chloride 75 mL/hr at 02/02/16 1207     LOS: 1 day    Time spent: 15 min    [place], DO [facility] Pager [phone]  If 7PM-7AM, please contact night-coverage [email or web address] Password [facility] 02/02/2016, 12:08 PM

## 2016-02-03 LAB — RENAL FUNCTION PANEL
Albumin: 3 g/dL — ABNORMAL LOW (ref 3.5–5.0)
Anion gap: 9 (ref 5–15)
BUN: 18 mg/dL (ref 6–20)
CHLORIDE: 110 mmol/L (ref 101–111)
CO2: 21 mmol/L — AB (ref 22–32)
Calcium: 8.7 mg/dL — ABNORMAL LOW (ref 8.9–10.3)
Creatinine, Ser: 2.14 mg/dL — ABNORMAL HIGH (ref 0.44–1.00)
GFR calc Af Amer: 28 mL/min — ABNORMAL LOW (ref >60)
GFR, EST NON AFRICAN AMERICAN: 24 mL/min — AB (ref >60)
GLUCOSE: 105 mg/dL — AB (ref 65–99)
POTASSIUM: 3.6 mmol/L (ref 3.5–5.1)
Phosphorus: 3.4 mg/dL (ref 2.5–4.6)
Sodium: 140 mmol/L (ref 135–145)

## 2016-02-03 NOTE — Progress Notes (Signed)
PROGRESS NOTE    Rebekah Pace  [id number] DOB: July 15, 1956 DOA: 01/31/2016 PCP: Alesia Richards, MD   Outpatient Specialists:     Brief Narrative:     Assessment & Plan:   Principal Problem:   AKI (acute kidney injury) ([place]) Active Problems:   Depression, controlled   Prediabetes   Labile hypertension   Bipolar affective disorder, depressed, severe, with psychotic behavior ([place])   Hypothyroidism   Morbid obesity ([place])  (BMI 31.84)   HCV (hepatitis C virus)   Diarrhea   Abdominal pain   AKI (acute kidney injury) ([place]):  Most likely due to diarrhea -differential diagnosis includes prerenal azotemia, ATN, CIN. Though sofosbuvir component of Epclusa can cause rhabdomyolysis, but clinically pt doesn't appear to be rhabdo per Dr. Augustin Coupe. No renal toxic agents in her MAR; she denies any NSAID use or herbal remedies. -Aggressive isotonic fluid resuscitation per Dr. Augustin Coupe -per Dr. Augustin Coupe, may benefit from a renal biopsy to define why she has had chronic leukocyturia especially if the urine culture is negative; could this be chronic interstitial nephritis?-- defer to renal - CK: 33  Possible UTI: Patient has positive urinalysis with moderate amount of leukocytes. She has a mildly increased urine frequency, indicating possible UTI.  -IV Rocephin - will treat for 3 days and then d/c -culture NGTD  Diarrhea: Etiology is not clear. We ruled out C. difficile colitis -IV fluid as above  Abdominal pain over right upper quadrant: Most likely due to hepatitis C. -u/s shows fatty infiltration  HCV - on Epclusa. -will continue Epclusa  Schizo affective and manic bipolar disorder: on Lithium with therapeutic level -continue Lithium, Artane and Thiothixene  Hypothyroidism: Last TSH was 4.04 on 4/18/617 -Continue home Synthroid  Depression and anxiety: Stable, no suicidal or homicidal ideations. -Continue home medications: Zolot    DVT prophylaxis:  SQ Heparin  Code  Status: Full Code   Family Communication:   Disposition Plan:     Consultants:   renal     Subjective: Wants to have shower and get up and walk around  Objective: Filed Vitals:   02/02/16 0823 02/02/16 1732 02/02/16 2057 02/03/16 0529  BP: 127/56 120/83 142/71 142/78  Pulse: 68 74 65 65  Temp: 98.5 F (36.9 C) 98.9 F (37.2 C) 98.9 F (37.2 C) 98.4 F (36.9 C)  TempSrc: Oral Oral Oral Oral  Resp: 17 16 16 16   Height:      Weight:      SpO2: 97% 97% 98% 98%    Intake/Output Summary (Last 24 hours) at 02/03/16 1304 Last data filed at 02/03/16 0900  Gross per 24 hour  Intake   1416 ml  Output      0 ml  Net   1416 ml   Filed Weights   01/31/16 1544 01/31/16 2145  Weight: 82.356 kg (181 lb 9 oz) 83.3 kg (183 lb 10.3 oz)    Examination:  General exam: Appears calm and comfortable  Respiratory system: Clear to auscultation. Respiratory effort normal. Cardiovascular system: S1 & S2 heard, RRR. No JVD, murmurs, rubs, gallops or clicks. No pedal edema. Gastrointestinal system: Abdomen is nondistended, soft and nontender. No organomegaly or masses felt. Normal bowel sounds heard. Psychiatry: Judgement and insight appear normal. Mood & affect appropriate.     Data Reviewed: I have personally reviewed following labs and imaging studies  CBC:  Recent Labs Lab 01/30/16 1221 01/31/16 1548 02/01/16 0314 02/02/16 0929  WBC 9.3 9.0 9.0 8.4  NEUTROABS 6789  --   --   --  HGB 12.0 12.3 10.5* 10.6*  HCT 36.3 37.5 32.4* 32.8*  MCV 88.1 87.6 88.0 89.9  PLT 231 267 206 [id number]   Basic Metabolic Panel:  Recent Labs Lab 01/30/16 1221 01/31/16 1548 02/01/16 0314 02/02/16 0929 02/03/16 0309  NA 136 139 138 139 140  K 4.4 4.1 3.9 4.3 3.6  CL 101 106 107 113* 110  CO2 20 22 20* 17* 21*  GLUCOSE 117* 99 94 107* 105*  BUN 43* 39* 34* 23* 18  CREATININE 5.39* 4.15* 3.50* 2.30* 2.14*  CALCIUM 9.6 9.8 8.6* 8.9 8.7*  MG 2.1  --   --   --   --   PHOS  --   --    --  3.3 3.4   GFR: Estimated Creatinine Clearance: 29.2 mL/min (by C-G formula based on Cr of 2.14). Liver Function Tests:  Recent Labs Lab 01/30/16 1221 01/31/16 1548 02/02/16 0929 02/03/16 0309  AST 17 22  --   --   ALT 11 15  --   --   ALKPHOS 71 71  --   --   BILITOT 0.4 0.5  --   --   PROT 7.3 7.7  --   --   ALBUMIN 4.1 4.0 3.2* 3.0*    Recent Labs Lab 01/31/16 1548  LIPASE 31   No results for input(s): AMMONIA in the last 168 hours. Coagulation Profile:  Recent Labs Lab 01/31/16 2120  INR 1.34   Cardiac Enzymes:  Recent Labs Lab 01/31/16 2120  CKTOTAL 33*   BNP (last 3 results) No results for input(s): PROBNP in the last 8760 hours. HbA1C: No results for input(s): HGBA1C in the last 72 hours. CBG:  Recent Labs Lab 02/01/16 0910 02/01/16 2045 02/02/16 0821  GLUCAP 105* 102* 128*   Lipid Profile: No results for input(s): CHOL, HDL, LDLCALC, TRIG, CHOLHDL, LDLDIRECT in the last 72 hours. Thyroid Function Tests: No results for input(s): TSH, T4TOTAL, FREET4, T3FREE, THYROIDAB in the last 72 hours. Anemia Panel: No results for input(s): VITAMINB12, FOLATE, FERRITIN, TIBC, IRON, RETICCTPCT in the last 72 hours. Urine analysis:    Component Value Date/Time   COLORURINE YELLOW 01/31/2016 1546   APPEARANCEUR HAZY* 01/31/2016 1546   LABSPEC 1.012 01/31/2016 1546   PHURINE 5.5 01/31/2016 1546   GLUCOSEU NEGATIVE 01/31/2016 1546   HGBUR NEGATIVE 01/31/2016 1546   BILIRUBINUR NEGATIVE 01/31/2016 1546   KETONESUR NEGATIVE 01/31/2016 1546   PROTEINUR NEGATIVE 01/31/2016 1546   UROBILINOGEN 0.2 02/09/2012 0244   NITRITE NEGATIVE 01/31/2016 1546   LEUKOCYTESUR MODERATE* 01/31/2016 1546   ) Recent Results (from the past 240 hour(s))  Urine culture     Status: None   Collection Time: 01/31/16  3:46 PM  Result Value Ref Range Status   Specimen Description URINE, CLEAN CATCH  Final   Special Requests NONE  Final   Culture MULTIPLE SPECIES PRESENT,  SUGGEST RECOLLECTION  Final   Report Status 02/02/2016 FINAL  Final  C difficile quick scan w PCR reflex     Status: None   Collection Time: 02/01/16  1:33 PM  Result Value Ref Range Status   C Diff antigen NEGATIVE NEGATIVE Final   C Diff toxin NEGATIVE NEGATIVE Final   C Diff interpretation Negative for toxigenic C. difficile  Final  Gastrointestinal Panel by PCR , Stool     Status: None   Collection Time: 02/01/16  1:33 PM  Result Value Ref Range Status   Campylobacter species NOT DETECTED NOT DETECTED Final   Plesimonas  shigelloides NOT DETECTED NOT DETECTED Final   Salmonella species NOT DETECTED NOT DETECTED Final   Yersinia enterocolitica NOT DETECTED NOT DETECTED Final   Vibrio species NOT DETECTED NOT DETECTED Final   Vibrio cholerae NOT DETECTED NOT DETECTED Final   Enteroaggregative E coli (EAEC) NOT DETECTED NOT DETECTED Final   Enteropathogenic E coli (EPEC) NOT DETECTED NOT DETECTED Final   Enterotoxigenic E coli (ETEC) NOT DETECTED NOT DETECTED Final   Shiga like toxin producing E coli (STEC) NOT DETECTED NOT DETECTED Final   E. coli O157 NOT DETECTED NOT DETECTED Final   Shigella/Enteroinvasive E coli (EIEC) NOT DETECTED NOT DETECTED Final   Cryptosporidium NOT DETECTED NOT DETECTED Final   Cyclospora cayetanensis NOT DETECTED NOT DETECTED Final   Entamoeba histolytica NOT DETECTED NOT DETECTED Final   Giardia lamblia NOT DETECTED NOT DETECTED Final   Adenovirus F40/41 NOT DETECTED NOT DETECTED Final   Astrovirus NOT DETECTED NOT DETECTED Final   Norovirus GI/GII NOT DETECTED NOT DETECTED Final   Rotavirus A NOT DETECTED NOT DETECTED Final   Sapovirus (I, II, IV, and V) NOT DETECTED NOT DETECTED Final      Anti-infectives    Start     Dose/Rate Route Frequency Ordered Stop   02/01/16 2100  cefTRIAXone (ROCEPHIN) 1 g in dextrose 5 % 50 mL IVPB     1 g 100 mL/hr over 30 Minutes Intravenous Every 24 hours 01/31/16 2141 02/02/16 2138   02/01/16 1000   Sofosbuvir-Velpatasvir 400-100 MG TABS 1 tablet     1 tablet Oral Every 24 hours 01/31/16 2050     01/31/16 2100  cefTRIAXone (ROCEPHIN) 1 g in dextrose 5 % 50 mL IVPB  Status:  Discontinued     1 g 100 mL/hr over 30 Minutes Intravenous Every 24 hours 01/31/16 2050 01/31/16 2141   01/31/16 1845  cefTRIAXone (ROCEPHIN) 1 g in dextrose 5 % 50 mL IVPB     1 g 100 mL/hr over 30 Minutes Intravenous  Once 01/31/16 1838 01/31/16 2011       Radiology Studies: No results found.      Scheduled Meds: . heparin  5,000 Units Subcutaneous Q8H  . levothyroxine  75 mcg Oral QAC breakfast  . lithium carbonate  300 mg Oral QHS  . sertraline  100 mg Oral Daily  . Sofosbuvir-Velpatasvir  1 tablet Oral Q24H  . thiothixene  5 mg Oral BID  . trihexyphenidyl  5 mg Oral QHS   Continuous Infusions: . sodium chloride 75 mL/hr at 02/02/16 1207     LOS: 2 days    Time spent: 15 min    [place], DO [facility] Pager [phone]  If 7PM-7AM, please contact night-coverage [email or web address] Password [facility] 02/03/2016, 1:04 PM

## 2016-02-03 NOTE — Progress Notes (Signed)
[facility] ROUNDING NOTE   Subjective:   Interval History: continues to improve.  We discussed her renal function, which she is happy to hear is improving.  Notes some nausea but no vomiting.  Notes that she is able to take PO.  She voices no other concerns at this time.   Notes she is having UOP.  No voids recorded.  Objective:  Vital signs in last 24 hours:  Temp:  [98.4 F (36.9 C)-98.9 F (37.2 C)] 98.4 F (36.9 C) (04/22 0529) Pulse Rate:  [65-74] 65 (04/22 0529) Resp:  [16] 16 (04/22 0529) BP: (120-142)/(71-83) 142/78 mmHg (04/22 0529) SpO2:  [97 %-98 %] 98 % (04/22 0529)  Weight change:  Filed Weights   01/31/16 1544 01/31/16 2145  Weight: 181 lb 9 oz (82.356 kg) 183 lb 10.3 oz (83.3 kg)    Intake/Output: I/O last 3 completed shifts: In: [id number] [P.O.:600; I.V.:816] Out: 0    Intake/Output this shift:    Exam: Gen: awake, alert, NAD CVS- RRR, no murmurs RS- CTAB, normal WOB on room air ABD- BS present soft non-distended EXT- no LE edema  Basic Metabolic Panel:  Recent Labs Lab 01/30/16 1221 01/31/16 1548 02/01/16 0314 02/02/16 0929 02/03/16 0309  NA 136 139 138 139 140  K 4.4 4.1 3.9 4.3 3.6  CL 101 106 107 113* 110  CO2 20 22 20* 17* 21*  GLUCOSE 117* 99 94 107* 105*  BUN 43* 39* 34* 23* 18  CREATININE 5.39* 4.15* 3.50* 2.30* 2.14*  CALCIUM 9.6 9.8 8.6* 8.9 8.7*  MG 2.1  --   --   --   --   PHOS  --   --   --  3.3 3.4    Liver Function Tests:  Recent Labs Lab 01/30/16 1221 01/31/16 1548 02/02/16 0929 02/03/16 0309  AST 17 22  --   --   ALT 11 15  --   --   ALKPHOS 71 71  --   --   BILITOT 0.4 0.5  --   --   PROT 7.3 7.7  --   --   ALBUMIN 4.1 4.0 3.2* 3.0*    Recent Labs Lab 01/31/16 1548  LIPASE 31   No results for input(s): AMMONIA in the last 168 hours.  CBC:  Recent Labs Lab 01/30/16 1221 01/31/16 1548 02/01/16 0314 02/02/16 0929  WBC 9.3 9.0 9.0 8.4  NEUTROABS 6789  --   --   --   HGB 12.0 12.3  10.5* 10.6*  HCT 36.3 37.5 32.4* 32.8*  MCV 88.1 87.6 88.0 89.9  PLT 231 267 206 207    Cardiac Enzymes:  Recent Labs Lab 01/31/16 2120  CKTOTAL 33*    BNP: Invalid input(s): POCBNP  CBG:  Recent Labs Lab 02/01/16 0910 02/01/16 2045 02/02/16 0821  GLUCAP 105* 102* 128*    Microbiology: Results for orders placed or performed during the hospital encounter of 01/31/16  Urine culture     Status: None   Collection Time: 01/31/16  3:46 PM  Result Value Ref Range Status   Specimen Description URINE, CLEAN CATCH  Final   Special Requests NONE  Final   Culture MULTIPLE SPECIES PRESENT, SUGGEST RECOLLECTION  Final   Report Status 02/02/2016 FINAL  Final  C difficile quick scan w PCR reflex     Status: None   Collection Time: 02/01/16  1:33 PM  Result Value Ref Range Status   C Diff antigen NEGATIVE NEGATIVE Final   C Diff  toxin NEGATIVE NEGATIVE Final   C Diff interpretation Negative for toxigenic C. difficile  Final  Gastrointestinal Panel by PCR , Stool     Status: None   Collection Time: 02/01/16  1:33 PM  Result Value Ref Range Status   Campylobacter species NOT DETECTED NOT DETECTED Final   Plesimonas shigelloides NOT DETECTED NOT DETECTED Final   Salmonella species NOT DETECTED NOT DETECTED Final   Yersinia enterocolitica NOT DETECTED NOT DETECTED Final   Vibrio species NOT DETECTED NOT DETECTED Final   Vibrio cholerae NOT DETECTED NOT DETECTED Final   Enteroaggregative E coli (EAEC) NOT DETECTED NOT DETECTED Final   Enteropathogenic E coli (EPEC) NOT DETECTED NOT DETECTED Final   Enterotoxigenic E coli (ETEC) NOT DETECTED NOT DETECTED Final   Shiga like toxin producing E coli (STEC) NOT DETECTED NOT DETECTED Final   E. coli O157 NOT DETECTED NOT DETECTED Final   Shigella/Enteroinvasive E coli (EIEC) NOT DETECTED NOT DETECTED Final   Cryptosporidium NOT DETECTED NOT DETECTED Final   Cyclospora cayetanensis NOT DETECTED NOT DETECTED Final   Entamoeba histolytica  NOT DETECTED NOT DETECTED Final   Giardia lamblia NOT DETECTED NOT DETECTED Final   Adenovirus F40/41 NOT DETECTED NOT DETECTED Final   Astrovirus NOT DETECTED NOT DETECTED Final   Norovirus GI/GII NOT DETECTED NOT DETECTED Final   Rotavirus A NOT DETECTED NOT DETECTED Final   Sapovirus (I, II, IV, and V) NOT DETECTED NOT DETECTED Final    Coagulation Studies:  Recent Labs  01/31/16 2120  LABPROT 16.7*  INR 1.34    Urinalysis:  Recent Labs  01/31/16 1546  COLORURINE YELLOW  LABSPEC 1.012  PHURINE 5.5  GLUCOSEU NEGATIVE  HGBUR NEGATIVE  BILIRUBINUR NEGATIVE  KETONESUR NEGATIVE  PROTEINUR NEGATIVE  NITRITE NEGATIVE  LEUKOCYTESUR MODERATE*      Imaging: US Abdomen Complete  02/01/2016  CLINICAL DATA:  Right upper quadrant abdominal pain for 1 week, diarrhea. EXAM: ABDOMEN ULTRASOUND COMPLETE COMPARISON:  Ultrasound of October 11, 2015. FINDINGS: Gallbladder: No gallstones or wall thickening visualized. No sonographic Murphy sign noted by sonographer. 4 mm gallbladder polyp is noted. Common bile duct: Diameter: 4 mm which is within normal limits. Liver: No focal lesion identified. Hepatic parenchyma is slightly increased and heterogeneous in echotexture suggesting fatty infiltration or other diffuse hepatocellular disease. IVC: No abnormality visualized. Pancreas: Visualized portion unremarkable. Spleen: Size and appearance within normal limits. Right Kidney: Length: 11.3 cm. Echogenicity within normal limits. No mass or hydronephrosis visualized. Left Kidney: Length: 10.8 cm. Echogenicity within normal limits. No mass or hydronephrosis visualized. Abdominal aorta: No aneurysm visualized. Other findings: None. IMPRESSION: Slightly increased and heterogeneous echotexture of hepatic parenchyma suggesting fatty infiltration or other diffuse hepatocellular disease. No other abnormality seen in the abdomen. Electronically Signed   By: Marijo Conception, M.D.   On: 02/01/2016 11:31      Medications:   . sodium chloride 75 mL/hr at 02/02/16 1207   . heparin  5,000 Units Subcutaneous Q8H  . levothyroxine  75 mcg Oral QAC breakfast  . lithium carbonate  300 mg Oral QHS  . sertraline  100 mg Oral Daily  . Sofosbuvir-Velpatasvir  1 tablet Oral Q24H  . thiothixene  5 mg Oral BID  . trihexyphenidyl  5 mg Oral QHS     Assessment/ Plan:   Acute kidney injury Creatinine continues to improve with the use of IV fluids/ PO intake.  Cr 2.14 this am.  Electrolytes stable.   No indications for dialysis  Volume status  controlled and hemodynamics stable  She has continued on Lithium   Improving renal function w/ fluids.  No indications for HD.  Will sign off at this time.  Please do not hesitate to re-consult if we can be of further assistance.    LOS: 2 Ashly Lajuana Ripple, DO 02/03/16 @9 :06 AM

## 2016-02-04 LAB — RENAL FUNCTION PANEL
ALBUMIN: 3.2 g/dL — AB (ref 3.5–5.0)
ANION GAP: 11 (ref 5–15)
BUN: 15 mg/dL (ref 6–20)
CALCIUM: 9.1 mg/dL (ref 8.9–10.3)
CO2: 20 mmol/L — AB (ref 22–32)
Chloride: 110 mmol/L (ref 101–111)
Creatinine, Ser: 1.58 mg/dL — ABNORMAL HIGH (ref 0.44–1.00)
GFR calc Af Amer: 40 mL/min — ABNORMAL LOW (ref >60)
GFR calc non Af Amer: 35 mL/min — ABNORMAL LOW (ref >60)
GLUCOSE: 99 mg/dL (ref 65–99)
Phosphorus: 3.4 mg/dL (ref 2.5–4.6)
Potassium: 3.7 mmol/L (ref 3.5–5.1)
SODIUM: 141 mmol/L (ref 135–145)

## 2016-02-04 LAB — GLUCOSE, CAPILLARY: GLUCOSE-CAPILLARY: 102 mg/dL — AB (ref 65–99)

## 2016-02-04 NOTE — Discharge Summary (Signed)
Physician Discharge Summary  MIKAH BAUM [id number] DOB: 07/06/1956 DOA: 01/31/2016  PCP: Alesia Richards, MD  Admit date: 01/31/2016 Discharge date: 02/04/2016   Recommendations for Outpatient Follow-Up:   1. BMP 1 week re Cr 2. U/A-- if still with Leukocytes in urine would consider renal biopsy   Discharge Diagnosis:   Principal Problem:   AKI (acute kidney injury) ([place]) Active Problems:   Depression, controlled   Prediabetes   Labile hypertension   Bipolar affective disorder, depressed, severe, with psychotic behavior ([place])   Hypothyroidism   Morbid obesity ([place])  (BMI 31.84)   HCV (hepatitis C virus)   Diarrhea   Abdominal pain   Discharge disposition:  Home.  Discharge Condition: Improved.  Diet recommendation: Low sodium, heart healthy.  Carbohydrate-modified.  Regular.  Wound care: None.   History of Present Illness:   Rebekah Pace is a 60 y.o. female with medical history significant of prediabetes, labile blood pressure, hypothyroidism, depression, anxiety, schizo-affective disorder, manic bipolar disorder, HCV being treated with Epclusa since 11/23/15 (for 12 weeks), who presents with diarrhea, abdominal pain, generalized weakness and increased urinary frequency.  Patient reports that she has been having diarrhea for 6 weeks. She has 1 to 3 BM with loose stool daily. She has nausea, but no vomiting. She also has moderate abdominal pain over right upper quadrant for about 1 week, which has improved today. Patient does not have a fever or chills. She has increased urinary frequency, but no dysuria or burning on urination. She also states that she has intermittent muscle pain over bilateral upper thighs recently. No chest pain, cough, shortness of breath ior unilateral weakness. Pt was seen by PCP today and was found to have AKI, therefore sent to ED for further eval and treatment.  ED Course: pt was found to have therapeutic lithium level at I.10,  positive urinalysis with moderate amount of leukocytes, lipase is 31, WBC 9.0, temperature normal, no tachycardia, creatinine 4.15, which was normal 3 months ago. Pt is admitted to inpatient for further eval and treatment and observation. Renal, dr. Augustin Coupe was consulted.    Hospital Course by Problem:   AKI (acute kidney injury) [facility]): Most likely due to diarrhea -differential diagnosis includes prerenal azotemia, ATN, CIN. Though sofosbuvir component of Epclusa can cause rhabdomyolysis, but clinically pt doesn't appear to be rhabdo per Dr. Augustin Coupe. No renal toxic agents in her MAR; she denies any NSAID use or herbal remedies. -Aggressive isotonic fluid resuscitation per Dr. Augustin Coupe -per Dr. Augustin Coupe, may benefit from a renal biopsy to define why she has had chronic leukocyturia especially if the urine culture is negative; could this be chronic interstitial nephritis?-- defer to renal - CK: 33  Possible UTI: Patient has positive urinalysis with moderate amount of leukocytes. She has a mildly increased urine frequency, indicating possible UTI.  -IV Rocephin - will treat for 3 days and then d/c -culture NGTD  Diarrhea: Etiology is not clear. We ruled out C. difficile colitis -IV fluid as above  Abdominal pain over right upper quadrant: Most likely due to hepatitis C. -u/s shows fatty infiltration  HCV - on Epclusa. -will continue Epclusa  Schizo affective and manic bipolar disorder: on Lithium with therapeutic level -continue Lithium, Artane and Thiothixene  Hypothyroidism: Last TSH was 4.04 on 4/18/617 -Continue home Synthroid  Depression and anxiety: Stable, no suicidal or homicidal ideations. -Continue home medications: [facility]:   renal  Discharge Exam:   Filed Vitals:  02/04/16 0525 02/04/16 0816  BP: 143/68 136/69  Pulse: 62 62  Temp: 98.8 F (37.1 C) 98.2 F (36.8 C)  Resp: 18 18   Filed Vitals:   02/03/16 1801 02/03/16 2131 02/04/16 0525 02/04/16  0816  BP: 153/74 123/93 143/68 136/69  Pulse: 85 73 62 62  Temp: 99.5 F (37.5 C) 98.9 F (37.2 C) 98.8 F (37.1 C) 98.2 F (36.8 C)  TempSrc: Oral Oral Oral Oral  Resp: 17 17 18 18   Height:      Weight:      SpO2: 94% 99% 98% 98%    Gen:  NAD    The results of significant diagnostics from this hospitalization (including imaging, microbiology, ancillary and laboratory) are listed below for reference.     Procedures and Diagnostic Studies:   US Abdomen Complete  02/01/2016  CLINICAL DATA:  Right upper quadrant abdominal pain for 1 week, diarrhea. EXAM: ABDOMEN ULTRASOUND COMPLETE COMPARISON:  Ultrasound of October 11, 2015. FINDINGS: Gallbladder: No gallstones or wall thickening visualized. No sonographic Murphy sign noted by sonographer. 4 mm gallbladder polyp is noted. Common bile duct: Diameter: 4 mm which is within normal limits. Liver: No focal lesion identified. Hepatic parenchyma is slightly increased and heterogeneous in echotexture suggesting fatty infiltration or other diffuse hepatocellular disease. IVC: No abnormality visualized. Pancreas: Visualized portion unremarkable. Spleen: Size and appearance within normal limits. Right Kidney: Length: 11.3 cm. Echogenicity within normal limits. No mass or hydronephrosis visualized. Left Kidney: Length: 10.8 cm. Echogenicity within normal limits. No mass or hydronephrosis visualized. Abdominal aorta: No aneurysm visualized. Other findings: None. IMPRESSION: Slightly increased and heterogeneous echotexture of hepatic parenchyma suggesting fatty infiltration or other diffuse hepatocellular disease. No other abnormality seen in the abdomen. Electronically Signed   By: Marijo Conception, M.D.   On: 02/01/2016 11:31     Labs:   Basic Metabolic Panel:  Recent Labs Lab 01/30/16 1221 01/31/16 1548 02/01/16 0314 02/02/16 0929 02/03/16 0309 02/04/16 0515  NA 136 139 138 139 140 141  K 4.4 4.1 3.9 4.3 3.6 3.7  CL 101 106 107 113* 110  110  CO2 20 22 20* 17* 21* 20*  GLUCOSE 117* 99 94 107* 105* 99  BUN 43* 39* 34* 23* 18 15  CREATININE 5.39* 4.15* 3.50* 2.30* 2.14* 1.58*  CALCIUM 9.6 9.8 8.6* 8.9 8.7* 9.1  MG 2.1  --   --   --   --   --   PHOS  --   --   --  3.3 3.4 3.4   GFR Estimated Creatinine Clearance: 39.5 mL/min (by C-G formula based on Cr of 1.58). Liver Function Tests:  Recent Labs Lab 01/30/16 1221 01/31/16 1548 02/02/16 0929 02/03/16 0309 02/04/16 0515  AST 17 22  --   --   --   ALT 11 15  --   --   --   ALKPHOS 71 71  --   --   --   BILITOT 0.4 0.5  --   --   --   PROT 7.3 7.7  --   --   --   ALBUMIN 4.1 4.0 3.2* 3.0* 3.2*    Recent Labs Lab 01/31/16 1548  LIPASE 31   No results for input(s): AMMONIA in the last 168 hours. Coagulation profile  Recent Labs Lab 01/31/16 2120  INR 1.34    CBC:  Recent Labs Lab 01/30/16 1221 01/31/16 1548 02/01/16 0314 02/02/16 0929  WBC 9.3 9.0 9.0 8.4  NEUTROABS 6789  --   --   --  HGB 12.0 12.3 10.5* 10.6*  HCT 36.3 37.5 32.4* 32.8*  MCV 88.1 87.6 88.0 89.9  PLT 231 267 206 207   Cardiac Enzymes:  Recent Labs Lab 01/31/16 2120  CKTOTAL 33*   BNP: Invalid input(s): POCBNP CBG:  Recent Labs Lab 02/01/16 0910 02/01/16 2045 02/02/16 0821 02/04/16 0818  GLUCAP 105* 102* 128* 102*   D-Dimer No results for input(s): DDIMER in the last 72 hours. Hgb A1c No results for input(s): HGBA1C in the last 72 hours. Lipid Profile No results for input(s): CHOL, HDL, LDLCALC, TRIG, CHOLHDL, LDLDIRECT in the last 72 hours. Thyroid function studies No results for input(s): TSH, T4TOTAL, T3FREE, THYROIDAB in the last 72 hours.  Invalid input(s): FREET3 Anemia work up No results for input(s): VITAMINB12, FOLATE, FERRITIN, TIBC, IRON, RETICCTPCT in the last 72 hours. Microbiology Recent Results (from the past 240 hour(s))  Urine culture     Status: None   Collection Time: 01/31/16  3:46 PM  Result Value Ref Range Status   Specimen  Description URINE, CLEAN CATCH  Final   Special Requests NONE  Final   Culture MULTIPLE SPECIES PRESENT, SUGGEST RECOLLECTION  Final   Report Status 02/02/2016 FINAL  Final  C difficile quick scan w PCR reflex     Status: None   Collection Time: 02/01/16  1:33 PM  Result Value Ref Range Status   C Diff antigen NEGATIVE NEGATIVE Final   C Diff toxin NEGATIVE NEGATIVE Final   C Diff interpretation Negative for toxigenic C. difficile  Final  Gastrointestinal Panel by PCR , Stool     Status: None   Collection Time: 02/01/16  1:33 PM  Result Value Ref Range Status   Campylobacter species NOT DETECTED NOT DETECTED Final   Plesimonas shigelloides NOT DETECTED NOT DETECTED Final   Salmonella species NOT DETECTED NOT DETECTED Final   Yersinia enterocolitica NOT DETECTED NOT DETECTED Final   Vibrio species NOT DETECTED NOT DETECTED Final   Vibrio cholerae NOT DETECTED NOT DETECTED Final   Enteroaggregative E coli (EAEC) NOT DETECTED NOT DETECTED Final   Enteropathogenic E coli (EPEC) NOT DETECTED NOT DETECTED Final   Enterotoxigenic E coli (ETEC) NOT DETECTED NOT DETECTED Final   Shiga like toxin producing E coli (STEC) NOT DETECTED NOT DETECTED Final   E. coli O157 NOT DETECTED NOT DETECTED Final   Shigella/Enteroinvasive E coli (EIEC) NOT DETECTED NOT DETECTED Final   Cryptosporidium NOT DETECTED NOT DETECTED Final   Cyclospora cayetanensis NOT DETECTED NOT DETECTED Final   Entamoeba histolytica NOT DETECTED NOT DETECTED Final   Giardia lamblia NOT DETECTED NOT DETECTED Final   Adenovirus F40/41 NOT DETECTED NOT DETECTED Final   Astrovirus NOT DETECTED NOT DETECTED Final   Norovirus GI/GII NOT DETECTED NOT DETECTED Final   Rotavirus A NOT DETECTED NOT DETECTED Final   Sapovirus (I, II, IV, and V) NOT DETECTED NOT DETECTED Final     Discharge Instructions:   Discharge Instructions    Diet Carb Modified    Complete by:  As directed      Discharge instructions    Complete by:  As  directed   Drink plenty of water- avoid soda and juice     Increase activity slowly    Complete by:  As directed             Medication List    TAKE these medications        EPCLUSA 400-100 MG Tabs  Generic drug:  Sofosbuvir-Velpatasvir  Take 1 tablet  by mouth daily. Take one tablet by mouth daily for 12 weeks.     levothyroxine 75 MCG tablet  Commonly known as:  SYNTHROID, LEVOTHROID  TAKE 1 TABLET BY MOUTH EVERY DAY     lithium 300 MG tablet  Take 300 mg by mouth at bedtime.     sertraline 100 MG tablet  Commonly known as:  ZOLOFT  takes 1 tablet by mouth once daily     thiothixene 5 MG capsule  Commonly known as:  NAVANE  Take 5 mg by mouth 2 (two) times daily.     trihexyphenidyl 5 MG tablet  Commonly known as:  ARTANE  Take 5 mg by mouth at bedtime.           Follow-up Information    Follow up with MCKEOWN,WILLIAM DAVID, MD In 1 week.   Specialty:  Internal Medicine   Why:  for [facility]   Contact information:   [address] [place] [address] [phone]        Time coordinating discharge: 35 min  Signed:  Tallan Sandoz Alison Stalling   [facility] 02/04/2016, 10:18 AM

## 2016-02-04 NOTE — Progress Notes (Signed)
Pt provided with discharge instructions including information on follow up appointments as well as medications. Pt verbalized understanding of all information. IV was dc'd without complication. Pt escorted out but refused wheelchair.

## 2016-02-06 ENCOUNTER — Telehealth: Payer: Self-pay | Admitting: *Deleted

## 2016-02-06 ENCOUNTER — Ambulatory Visit: Payer: Self-pay

## 2016-02-06 NOTE — Telephone Encounter (Signed)
Patient called and states she was discharged from the hospital on 02/06/2016.  She now has a film on her tongue with burning and a strange odor to her breath.  Per Dr Melford Aase, try 1/2 tsp vinegar in a 1/2 glass of water, rinse and hold the solution in your mouth for about one minute.  Repeat the process 4 times a day, after meals and at bedtime.  Patient is aware.

## 2016-02-13 ENCOUNTER — Ambulatory Visit
Admission: RE | Admit: 2016-02-13 | Discharge: 2016-02-13 | Disposition: A | Discharge status: Home / Self Care | Payer: PPO | Source: Ambulatory Visit

## 2016-02-13 DIAGNOSIS — Z1231 Encounter for screening mammogram for malignant neoplasm of breast: Secondary | ICD-10-CM

## 2016-02-14 DIAGNOSIS — B182 Chronic viral hepatitis C: Secondary | ICD-10-CM | POA: Diagnosis not present

## 2016-02-15 ENCOUNTER — Ambulatory Visit (INDEPENDENT_AMBULATORY_CARE_PROVIDER_SITE_OTHER): Payer: PPO | Admitting: Internal Medicine

## 2016-02-15 ENCOUNTER — Other Ambulatory Visit: Payer: Self-pay | Admitting: Internal Medicine

## 2016-02-15 VITALS — BP 116/78 | HR 72 | Temp 97.0°F | Resp 16 | Ht 64.0 in | Wt 178.6 lb

## 2016-02-15 DIAGNOSIS — Z79899 Other long term (current) drug therapy: Secondary | ICD-10-CM

## 2016-02-15 DIAGNOSIS — I10 Essential (primary) hypertension: Secondary | ICD-10-CM

## 2016-02-15 DIAGNOSIS — R0989 Other specified symptoms and signs involving the circulatory and respiratory systems: Secondary | ICD-10-CM

## 2016-02-15 DIAGNOSIS — N179 Acute kidney failure, unspecified: Secondary | ICD-10-CM

## 2016-02-15 DIAGNOSIS — N390 Urinary tract infection, site not specified: Secondary | ICD-10-CM | POA: Diagnosis not present

## 2016-02-16 ENCOUNTER — Encounter: Payer: Self-pay | Admitting: Internal Medicine

## 2016-02-16 LAB — URINALYSIS, MICROSCOPIC ONLY
Bacteria, UA: NONE SEEN [HPF]
CRYSTALS: NONE SEEN [HPF]
Casts: NONE SEEN [LPF]
RBC / HPF: NONE SEEN RBC/HPF (ref <=2)
YEAST: NONE SEEN [HPF]

## 2016-02-16 LAB — BASIC METABOLIC PANEL WITH GFR
BUN: 15 mg/dL (ref 7–25)
CALCIUM: 9.8 mg/dL (ref 8.6–10.4)
CO2: 24 mmol/L (ref 20–31)
Chloride: 104 mmol/L (ref 98–110)
Creat: 1 mg/dL — ABNORMAL HIGH (ref 0.50–0.99)
GFR, EST AFRICAN AMERICAN: 71 mL/min (ref >=60)
GFR, EST NON AFRICAN AMERICAN: 61 mL/min (ref >=60)
Glucose, Bld: 86 mg/dL (ref 65–99)
POTASSIUM: 4.1 mmol/L (ref 3.5–5.3)
Sodium: 137 mmol/L (ref 135–146)

## 2016-02-16 LAB — URINALYSIS, ROUTINE W REFLEX MICROSCOPIC
Bilirubin Urine: NEGATIVE
Glucose, UA: NEGATIVE
HGB URINE DIPSTICK: NEGATIVE
KETONES UR: NEGATIVE
NITRITE: NEGATIVE
PROTEIN: NEGATIVE
Specific Gravity, Urine: 1.011 (ref 1.001–1.035)
pH: 6 (ref 5.0–8.0)

## 2016-02-16 LAB — MICROALBUMIN / CREATININE URINE RATIO
CREATININE, URINE: 105 mg/dL (ref 20–320)
Microalb Creat Ratio: 13 mcg/mg creat (ref <30)
Microalb, Ur: 1.4 mg/dL

## 2016-02-16 NOTE — Progress Notes (Signed)
Patient ID: Rebekah Pace, female   DOB: Sep 14, 1956, 60 y.o.   MRN: [id number]   [facility]                        Unk Pinto, M.D.        Uvaldo Bristle. Silverio Lay, P.A.-C       Starlyn Skeans, P.A.-C   [facility]                [address] [place], [place] SSN-[id number] Telephone [phone] Telefax [phone] _________________________________________________________________________________________________________________   This very nice 60 y.o. DWF presents for hospital  follow up. Patient was contacted post hospital and was scheduled for f/u. She has also been followed for Labile Hypertension, Hyperlipidemia, Pre-Diabetes, Hypothyroidism.  and Vitamin D Deficiency. Patient also has hx/o Bipolar I Manic-Depressive Schizophrenia and of note she had been on Epclusa for Hepatitis C and has since hospitalization completed the 12 week course.    Patient was seen 01/30/2016 for routine f/u and had no major complaints or concerns, but routine labs returned consistent with Acute Renal Failure with BUN 43 & Creatinine 5.39 and patient was directed to the ER for stat confirmation and was subsequently admitted from 4/19-4/23/2017. Work-up in the hospital was non-revealing and Nephrology consultant, Dr Wynn Banker, apparently was perplexed as to the etiology. Patient was hydrated by IVF and renal functions gradually improved with urine output sufficient. Today's BP is 116/68. Patient has had no complaints of any cardiac type chest pain, palpitations, dyspnea/orthopnea/PND, dizziness, claudication, or dependent edema. She presents today for close f/u of her renal functions.    Hyperlipidemia is not controlled with diet. Last Lipids were not at goal with Cholesterol 180; HDL 24*; elevated LDL 110; Triglycerides 228 on 01/30/2016.   Also, the patient has history of PreDiabetes and has had no symptoms of reactive hypoglycemia, diabetic  polys, paresthesias or visual blurring.  Last A1c was 4.8% on  01/30/2016.   Further, the patient also has history of Vitamin D Deficiency and supplements vitamin D without any suspected side-effects. Last vitamin D was  18 on 01/30/2016.  Medication Sig  . levothyroxine  75 MCG TAKE 1 TABLET BY MOUTH EVERY DAY  . lithium 300 MG tablet Take 300 mg by mouth at bedtime.   . sertraline  100 MG tablet takes 1 tablet by mouth once daily  . thiothixene  5 MG capsule Take 5 mg by mouth 2 (two) times daily.  . ARTANE 5 MG tablet Take 5 mg by mouth at bedtime.  . Sofosbuvir-Velpatasvir (EPCLUSA) 400-100 MG TABS Take 1 tablet by mouth daily. Reported on 02/15/2016   Allergies  Allergen Reactions  . Sulfa Antibiotics Rash   PMHx:   Past Medical History  Diagnosis Date  . Mental disorder   . Hepatitis   . Anxiety   . Depression   . Thyroid disease   . Hypothyroidism   . Prediabetes   . Labile hypertension    Immunization History  Administered Date(s) Administered  . Pneumococcal-Unspecified 10/14/2001  . Td 10/15/1995   Past Surgical History  Procedure Laterality Date  . Arm fracture    . Rotator cuff repair     FHx:    Reviewed / unchanged  SHx:    Reviewed / unchanged  Systems Review:  Constitutional: Denies fever, chills, wt changes, headaches, insomnia, fatigue,  night sweats, change in appetite. Eyes: Denies redness, blurred vision, diplopia, discharge, itchy, watery eyes.  ENT: Denies discharge, congestion, post nasal drip, epistaxis, sore throat, earache, hearing loss, dental pain, tinnitus, vertigo, sinus pain, snoring.  CV: Denies chest pain, palpitations, irregular heartbeat, syncope, dyspnea, diaphoresis, orthopnea, PND, claudication or edema. Respiratory: denies cough, dyspnea, DOE, pleurisy, hoarseness, laryngitis, wheezing.  Gastrointestinal: Denies dysphagia, odynophagia, heartburn, reflux, water brash, abdominal pain or cramps, nausea, vomiting, bloating, diarrhea,  constipation, hematemesis, melena, hematochezia  or hemorrhoids. Genitourinary: Denies dysuria, frequency, urgency, nocturia, hesitancy, discharge, hematuria or flank pain. Musculoskeletal: Denies arthralgias, myalgias, stiffness, jt. swelling, pain, limping or strain/sprain.  Skin: Denies pruritus, rash, hives, warts, acne, eczema or change in skin lesion(s). Neuro: No weakness, tremor, incoordination, spasms, paresthesia or pain. Psychiatric: Denies confusion, memory loss or sensory loss. Endo: Denies change in weight, skin or hair change.  Heme/Lymph: No excessive bleeding, bruising or enlarged lymph nodes.  Physical Exam  BP 116/78 mmHg  Pulse 72  Temp(Src) 97 F (36.1 C)  Resp 16  Ht 5\' 4"  (1.626 m)  Wt 178 lb 9.6 oz (81.012 kg)  BMI 30.64 kg/m2  Appears well nourished and in no distress. Eyes: PERRLA, EOMs, conjunctiva no swelling or erythema. Sinuses: No frontal/maxillary tenderness ENT/Mouth: EAC's clear, TM's nl w/o erythema, bulging. Nares clear w/o erythema, swelling, exudates. Oropharynx clear without erythema or exudates. Oral hygiene is good. Tongue normal, non obstructing. Hearing intact.  Neck: Supple. Thyroid nl. Car 2+/2+ without bruits, nodes or JVD. Chest: Respirations nl with BS clear & equal w/o rales, rhonchi, wheezing or stridor.  Cor: Heart sounds normal w/ regular rate and rhythm without sig. murmurs, gallops, clicks, or rubs. Peripheral pulses normal and equal  without edema.  Abdomen: Soft & bowel sounds normal. Non-tender w/o guarding, rebound, hernias, masses, or organomegaly.  Lymphatics: Unremarkable.  Musculoskeletal: Full ROM all peripheral extremities, joint stability, 5/5 strength, and normal gait.  Skin: Warm, dry without exposed rashes, lesions or ecchymosis apparent.  Neuro: Cranial nerves intact, reflexes equal bilaterally. Sensory-motor testing grossly intact. Tendon reflexes grossly intact.  Pysch: Alert & oriented x 3.  Insight and judgement  nl & appropriate. No ideations.  Assessment and Plan:   1. AKI (acute kidney injury) ([place])  - Urinalysis, Routine w reflex microscopic  - Microalbumin / creatinine urine ratio  2. Labile hypertension   3. Medication management  - Urinalysis, Routine w reflex microscopic - BASIC METABOLIC PANEL WITH GFR   Recommended regular exercise, BP monitoring, weight control, and discussed med and SE's. Recommended labs to assess and monitor clinical status. Further disposition pending results of labs. Over 40 minutes of exam, counseling, chart review and high complex critical decision making was performed.

## 2016-02-17 LAB — URINE CULTURE

## 2016-02-18 ENCOUNTER — Other Ambulatory Visit: Payer: Self-pay | Admitting: Internal Medicine

## 2016-02-18 DIAGNOSIS — N390 Urinary tract infection, site not specified: Secondary | ICD-10-CM

## 2016-02-18 MED ORDER — CEPHALEXIN 500 MG PO CAPS: ORAL_CAPSULE | ORAL | Status: DC

## 2016-02-19 ENCOUNTER — Telehealth: Payer: Self-pay | Admitting: *Deleted

## 2016-02-19 NOTE — Telephone Encounter (Signed)
Pt aware of positive urine cult results. An appointment for a nurse visit for a UA and urine culture and sensitivity made.

## 2016-02-20 DIAGNOSIS — K740 Hepatic fibrosis: Secondary | ICD-10-CM | POA: Diagnosis not present

## 2016-02-20 DIAGNOSIS — B182 Chronic viral hepatitis C: Secondary | ICD-10-CM | POA: Diagnosis not present

## 2016-03-21 ENCOUNTER — Other Ambulatory Visit: Payer: PPO

## 2016-03-21 DIAGNOSIS — N390 Urinary tract infection, site not specified: Secondary | ICD-10-CM | POA: Diagnosis not present

## 2016-03-22 LAB — URINALYSIS, ROUTINE W REFLEX MICROSCOPIC
BILIRUBIN URINE: NEGATIVE
GLUCOSE, UA: NEGATIVE
Ketones, ur: NEGATIVE
Nitrite: NEGATIVE
Protein, ur: NEGATIVE
SPECIFIC GRAVITY, URINE: 1.01 (ref 1.001–1.035)
pH: 6.5 (ref 5.0–8.0)

## 2016-03-22 LAB — URINE CULTURE: Colony Count: 40000

## 2016-03-22 LAB — URINALYSIS, MICROSCOPIC ONLY
BACTERIA UA: NONE SEEN [HPF]
Casts: NONE SEEN [LPF]
Crystals: NONE SEEN [HPF]
Yeast: NONE SEEN [HPF]

## 2016-03-25 ENCOUNTER — Other Ambulatory Visit: Payer: Self-pay | Admitting: Internal Medicine

## 2016-04-06 ENCOUNTER — Other Ambulatory Visit: Payer: Self-pay | Admitting: Internal Medicine

## 2016-05-06 ENCOUNTER — Ambulatory Visit: Payer: Self-pay | Admitting: Physician Assistant

## 2016-05-15 ENCOUNTER — Ambulatory Visit: Payer: Self-pay | Admitting: Physician Assistant

## 2016-05-15 NOTE — Progress Notes (Deleted)
Patient ID: Rebekah Pace, female   DOB: [birth year], 60 y.o.   MRN: [id number]  Assessment and Plan:  Hypertension:  -Continue medication,  -monitor blood pressure at home.  -Continue DASH diet.   -Reminder to go to the ER if any CP, SOB, nausea, dizziness, severe HA, changes vision/speech, left arm numbness and tingling, and jaw pain.  Cholesterol: -Continue diet and exercise.  -Check cholesterol.   Pre-diabetes: -Continue diet and exercise.  -Check A1C  Vitamin D Def: -check level -continue medications.   Chronic Hepatitis C -ultrasound shows fibrosis of the liver and some steatosis. -followup with ID -will likely need treatment but no current evidence of tumors  Bipolar with schizoaffective tendencies -cont lithium -followed by psych -lithium level  Continue diet and meds as discussed. Further disposition pending results of labs.  HPI 60 y.o. female  presents for 3 month follow up with hypertension, hyperlipidemia, prediabetes and vitamin D.   Her blood pressure has been controlled at home, today their BP is  .   She does workout. She denies chest pain, shortness of breath, dizziness.  She has been trying to walk but the weather has made it a little more difficult.  She is actively trying to lose some weight.     She is not on cholesterol medication and denies myalgias. Her cholesterol is at goal. The cholesterol last visit was:   Lab Results  Component Value Date   CHOL 180 01/30/2016   HDL 24 (L) 01/30/2016   LDLCALC 110 01/30/2016   TRIG 228 (H) 01/30/2016   CHOLHDL 7.5 (H) 01/30/2016    Last A1C in the office was:  Lab Results  Component Value Date   HGBA1C 4.8 01/30/2016    Patient is on Vitamin D supplement.  Lab Results  Component Value Date   VD25OH 18 (L) 01/30/2016     She has been seeing an infectious disease specialist due to chronic hep C.   She is on thyroid medication. Her medication was changed last visit.   Lab Results  Component Value  Date   TSH 4.04 01/30/2016  .   Current Medications:  Current Outpatient Prescriptions on File Prior to Visit  Medication Sig Dispense Refill  . cephALEXin (KEFLEX) 500 MG capsule Take 1 capsule 4 x/day after meals & bedtime for infection 40 capsule 0  . levothyroxine (SYNTHROID, LEVOTHROID) 75 MCG tablet TAKE 1 TABLET BY MOUTH EVERY DAY 90 tablet 1  . lithium 300 MG tablet Take 300 mg by mouth at bedtime.     . sertraline (ZOLOFT) 100 MG tablet takes 1 tablet by mouth once daily  0  . Sofosbuvir-Velpatasvir (EPCLUSA) 400-100 MG TABS Take 1 tablet by mouth daily. Reported on 02/15/2016    . thiothixene (NAVANE) 5 MG capsule Take 5 mg by mouth 2 (two) times daily.    . trihexyphenidyl (ARTANE) 5 MG tablet Take 5 mg by mouth at bedtime.     No current facility-administered medications on file prior to visit.     Medical History:  Past Medical History:  Diagnosis Date  . Anxiety   . Depression   . Hepatitis   . Hypothyroidism   . Labile hypertension   . Mental disorder   . Prediabetes   . Thyroid disease     Allergies:  Allergies  Allergen Reactions  . Sulfa Antibiotics Rash     Review of Systems:  Review of Systems  Constitutional: Negative for chills, fever and malaise/fatigue.  HENT: Negative for congestion,  ear pain and sore throat.   Respiratory: Negative for cough, sputum production, shortness of breath and wheezing.   Cardiovascular: Negative for chest pain, palpitations and leg swelling.  Gastrointestinal: Negative for abdominal pain, blood in stool, constipation, diarrhea, heartburn and melena.  Genitourinary: Negative.   Skin: Negative.   Neurological: Negative for dizziness, sensory change, loss of consciousness and headaches.  Psychiatric/Behavioral: Negative for depression. The patient is not nervous/anxious and does not have insomnia.     Family history- Review and unchanged  Social history- Review and unchanged  Physical Exam: There were no vitals  taken for this visit. Wt Readings from Last 3 Encounters:  02/16/16 178 lb 9.6 oz (81 kg)  01/31/16 183 lb 10.3 oz (83.3 kg)  01/30/16 184 lb 6.4 oz (83.6 kg)    General Appearance: Well nourished well developed, in no apparent distress. Eyes: PERRLA, EOMs, conjunctiva no swelling or erythema ENT/Mouth: Ear canals normal without obstruction, swelling, erythma, discharge.  TMs normal bilaterally.  Oropharynx moist, clear, without exudate, or postoropharyngeal swelling. Neck: Supple, thyroid normal,no cervical adenopathy  Respiratory: Respiratory effort normal, Breath sounds clear A&P without rhonchi, wheeze, or rale.  No retractions, no accessory usage. Cardio: RRR with no MRGs. Brisk peripheral pulses without edema.  Abdomen: Soft, + BS,  Non tender, no guarding, rebound, hernias, masses. Musculoskeletal: Full ROM, 5/5 strength, Normal gait Skin: Warm, dry without rashes, lesions, ecchymosis.  Neuro: Awake and oriented X 3, Cranial nerves intact. Normal muscle tone, no cerebellar symptoms. Psych: Normal affect, Insight and Judgment appropriate.    Vicie Mutters, PA-C 9:05 AM [facility]

## 2016-05-16 ENCOUNTER — Encounter: Payer: Self-pay | Admitting: Physician Assistant

## 2016-05-16 ENCOUNTER — Ambulatory Visit (INDEPENDENT_AMBULATORY_CARE_PROVIDER_SITE_OTHER): Payer: PPO | Admitting: Physician Assistant

## 2016-05-16 VITALS — BP 130/66 | HR 84 | Temp 97.3°F | Resp 16 | Ht 64.0 in | Wt 185.0 lb

## 2016-05-16 DIAGNOSIS — N179 Acute kidney failure, unspecified: Secondary | ICD-10-CM | POA: Diagnosis not present

## 2016-05-16 DIAGNOSIS — Z79899 Other long term (current) drug therapy: Secondary | ICD-10-CM

## 2016-05-16 DIAGNOSIS — I10 Essential (primary) hypertension: Secondary | ICD-10-CM | POA: Diagnosis not present

## 2016-05-16 DIAGNOSIS — E559 Vitamin D deficiency, unspecified: Secondary | ICD-10-CM

## 2016-05-16 DIAGNOSIS — B182 Chronic viral hepatitis C: Secondary | ICD-10-CM | POA: Diagnosis not present

## 2016-05-16 DIAGNOSIS — F3342 Major depressive disorder, recurrent, in full remission: Secondary | ICD-10-CM

## 2016-05-16 DIAGNOSIS — E782 Mixed hyperlipidemia: Secondary | ICD-10-CM | POA: Diagnosis not present

## 2016-05-16 DIAGNOSIS — R197 Diarrhea, unspecified: Secondary | ICD-10-CM

## 2016-05-16 DIAGNOSIS — M25572 Pain in left ankle and joints of left foot: Secondary | ICD-10-CM

## 2016-05-16 DIAGNOSIS — E039 Hypothyroidism, unspecified: Secondary | ICD-10-CM

## 2016-05-16 DIAGNOSIS — R0989 Other specified symptoms and signs involving the circulatory and respiratory systems: Secondary | ICD-10-CM

## 2016-05-16 DIAGNOSIS — F250 Schizoaffective disorder, bipolar type: Secondary | ICD-10-CM | POA: Diagnosis not present

## 2016-05-16 LAB — BASIC METABOLIC PANEL WITH GFR
BUN: 14 mg/dL (ref 7–25)
CHLORIDE: 105 mmol/L (ref 98–110)
CO2: 25 mmol/L (ref 20–31)
Calcium: 9.8 mg/dL (ref 8.6–10.4)
Creat: 1.04 mg/dL — ABNORMAL HIGH (ref 0.50–0.99)
GFR, EST NON AFRICAN AMERICAN: 59 mL/min — AB (ref >=60)
GFR, Est African American: 67 mL/min (ref >=60)
GLUCOSE: 78 mg/dL (ref 65–99)
POTASSIUM: 4 mmol/L (ref 3.5–5.3)
Sodium: 138 mmol/L (ref 135–146)

## 2016-05-16 LAB — HEPATIC FUNCTION PANEL
ALK PHOS: 91 U/L (ref 33–130)
ALT: 16 U/L (ref 6–29)
AST: 21 U/L (ref 10–35)
Albumin: 4.4 g/dL (ref 3.6–5.1)
BILIRUBIN INDIRECT: 0.4 mg/dL (ref 0.2–1.2)
Bilirubin, Direct: 0.1 mg/dL (ref <=0.2)
TOTAL PROTEIN: 7.5 g/dL (ref 6.1–8.1)
Total Bilirubin: 0.5 mg/dL (ref 0.2–1.2)

## 2016-05-16 LAB — CBC WITH DIFFERENTIAL/PLATELET
BASOS PCT: 1 %
Basophils Absolute: 82 cells/uL (ref 0–200)
Eosinophils Absolute: 328 cells/uL (ref 15–500)
Eosinophils Relative: 4 %
HEMATOCRIT: 39.1 % (ref 35.0–45.0)
HEMOGLOBIN: 12.6 g/dL (ref 11.7–15.5)
LYMPHS ABS: 1968 {cells}/uL (ref 850–3900)
Lymphocytes Relative: 24 %
MCH: 28.8 pg (ref 27.0–33.0)
MCHC: 32.2 g/dL (ref 32.0–36.0)
MCV: 89.5 fL (ref 80.0–100.0)
MONO ABS: 738 {cells}/uL (ref 200–950)
MPV: 9.8 fL (ref 7.5–12.5)
Monocytes Relative: 9 %
Neutro Abs: 5084 cells/uL (ref 1500–7800)
Neutrophils Relative %: 62 %
Platelets: 200 10*3/uL (ref 140–400)
RBC: 4.37 MIL/uL (ref 3.80–5.10)
RDW: 13.3 % (ref 11.0–15.0)
WBC: 8.2 10*3/uL (ref 3.8–10.8)

## 2016-05-16 LAB — LIPID PANEL
Cholesterol: 199 mg/dL (ref 125–200)
HDL: 36 mg/dL — ABNORMAL LOW (ref >=46)
LDL CALC: 119 mg/dL (ref <130)
Total CHOL/HDL Ratio: 5.5 Ratio — ABNORMAL HIGH (ref <=5.0)
Triglycerides: 221 mg/dL — ABNORMAL HIGH (ref <150)
VLDL: 44 mg/dL — ABNORMAL HIGH (ref <30)

## 2016-05-16 LAB — MAGNESIUM: Magnesium: 2 mg/dL (ref 1.5–2.5)

## 2016-05-16 LAB — TSH: TSH: 2.39 mIU/L

## 2016-05-16 LAB — URIC ACID: URIC ACID, SERUM: 7.4 mg/dL — AB (ref 2.5–7.0)

## 2016-05-16 NOTE — Patient Instructions (Addendum)
Avoid milk products Avoid fatty foods/fried foods Add probiotic for diarrhea If still going on we will check stool culture/refer to GI  Ice your ankle, get compression sleeve for that foot Try to rest If not better will refer to ortho  Increase fluids.     Food Choices to Help Relieve Diarrhea, Adult When you have diarrhea, the foods you eat and your eating habits are very important. Choosing the right foods and drinks can help relieve diarrhea. Also, because diarrhea can last up to 7 days, you need to replace lost fluids and electrolytes (such as sodium, potassium, and chloride) in order to help prevent dehydration.  WHAT GENERAL GUIDELINES DO I NEED TO FOLLOW?  Slowly drink 1 cup (8 oz) of fluid for each episode of diarrhea. If you are getting enough fluid, your urine will be clear or pale yellow.  Eat starchy foods. Some good choices include white rice, white toast, pasta, low-fiber cereal, baked potatoes (without the skin), saltine crackers, and bagels.  Avoid large servings of any cooked vegetables.  Limit fruit to two servings per day. A serving is  cup or 1 small piece.  Choose foods with less than 2 g of fiber per serving.  Limit fats to less than 8 tsp (38 g) per day.  Avoid fried foods.  Eat foods that have probiotics in them. Probiotics can be found in certain dairy products.  Avoid foods and beverages that may increase the speed at which food moves through the stomach and intestines (gastrointestinal tract). Things to avoid include:  High-fiber foods, such as dried fruit, raw fruits and vegetables, nuts, seeds, and whole grain foods.  Spicy foods and high-fat foods.  Foods and beverages sweetened with high-fructose corn syrup, honey, or sugar alcohols such as xylitol, sorbitol, and mannitol. WHAT FOODS ARE RECOMMENDED? Grains White rice. White, [place], or pita breads (fresh or toasted), including plain rolls, buns, or bagels. White pasta. Saltine, soda, or  graham crackers. Pretzels. Low-fiber cereal. Cooked cereals made with water (such as cornmeal, farina, or cream cereals). Plain muffins. Matzo. Melba toast. Zwieback.  Vegetables Potatoes (without the skin). Strained tomato and vegetable juices. Most well-cooked and canned vegetables without seeds. Tender lettuce. Fruits Cooked or canned applesauce, apricots, cherries, fruit cocktail, grapefruit, peaches, pears, or plums. Fresh bananas, apples without skin, cherries, grapes, cantaloupe, grapefruit, peaches, oranges, or plums.  Meat and Other Protein Products Baked or boiled chicken. Eggs. Tofu. Fish. Seafood. Smooth peanut butter. Ground or well-cooked tender beef, ham, veal, lamb, pork, or poultry.  Dairy Plain yogurt, kefir, and unsweetened liquid yogurt. Lactose-free milk, buttermilk, or soy milk. Plain hard cheese. Beverages Sport drinks. Clear broths. Diluted fruit juices (except prune). Regular, caffeine-free sodas such as ginger ale. Water. Decaffeinated teas. Oral rehydration solutions. Sugar-free beverages not sweetened with sugar alcohols. Other Bouillon, broth, or soups made from recommended foods.  The items listed above may not be a complete list of recommended foods or beverages. Contact your dietitian for more options. WHAT FOODS ARE NOT RECOMMENDED? Grains Whole grain, whole wheat, bran, or rye breads, rolls, pastas, crackers, and cereals. Wild or brown rice. Cereals that contain more than 2 g of fiber per serving. Corn tortillas or taco shells. Cooked or dry oatmeal. Granola. Popcorn. Vegetables Raw vegetables. Cabbage, broccoli, Brussels sprouts, artichokes, baked beans, beet greens, corn, kale, legumes, peas, sweet potatoes, and yams. Potato skins. Cooked spinach and cabbage. Fruits Dried fruit, including raisins and dates. Raw fruits. Stewed or dried prunes. Fresh apples with skin, apricots,  mangoes, pears, raspberries, and strawberries.  Meat and Other Protein  Products Chunky peanut butter. Nuts and seeds. Beans and lentils. Berniece Salines.  Dairy High-fat cheeses. Milk, chocolate milk, and beverages made with milk, such as milk shakes. Cream. Ice cream. Sweets and Desserts Sweet rolls, doughnuts, and sweet breads. Pancakes and waffles. Fats and Oils Butter. Cream sauces. Margarine. Salad oils. Plain salad dressings. Olives. Avocados.  Beverages Caffeinated beverages (such as coffee, tea, soda, or energy drinks). Alcoholic beverages. Fruit juices with pulp. Prune juice. Soft drinks sweetened with high-fructose corn syrup or sugar alcohols. Other Coconut. Hot sauce. Chili powder. Mayonnaise. Gravy. Cream-based or milk-based soups.  The items listed above may not be a complete list of foods and beverages to avoid. Contact your dietitian for more information. WHAT SHOULD I DO IF I BECOME DEHYDRATED? Diarrhea can sometimes lead to dehydration. Signs of dehydration include dark urine and dry mouth and skin. If you think you are dehydrated, you should rehydrate with an oral rehydration solution. These solutions can be purchased at pharmacies, retail stores, or online.  Drink -1 cup (120-240 mL) of oral rehydration solution each time you have an episode of diarrhea. If drinking this amount makes your diarrhea worse, try drinking smaller amounts more often. For example, drink 1-3 tsp (5-15 mL) every 5-10 minutes.  A general rule for staying hydrated is to drink 1-2 L of fluid per day. Talk to your health care provider about the specific amount you should be drinking each day. Drink enough fluids to keep your urine clear or pale yellow.   This information is not intended to replace advice given to you by your health care provider. Make sure you discuss any questions you have with your health care provider.   Document Released: 12/21/2003 Document Revised: 10/21/2014 Document Reviewed: 08/23/2013 Elsevier Interactive Patient Education 2016 [employer]  Acute  Ankle Sprain With Phase I Rehab An acute ankle sprain is a partial or complete tear in one or more of the ligaments of the ankle due to traumatic injury. The severity of the injury depends on both the number of ligaments sprained and the grade of sprain. There are 3 grades of sprains.   A grade 1 sprain is a mild sprain. There is a slight pull without obvious tearing. There is no loss of strength, and the muscle and ligament are the correct length.  A grade 2 sprain is a moderate sprain. There is tearing of fibers within the substance of the ligament where it connects two bones or two cartilages. The length of the ligament is increased, and there is usually decreased strength.  A grade 3 sprain is a complete rupture of the ligament and is uncommon. In addition to the grade of sprain, there are three types of ankle sprains.  Lateral ankle sprains: This is a sprain of one or more of the three ligaments on the outer side (lateral) of the ankle. These are the most common sprains. Medial ankle sprains: There is one large triangular ligament of the inner side (medial) of the ankle that is susceptible to injury. Medial ankle sprains are less common. Syndesmosis, "high ankle," sprains: The syndesmosis is the ligament that connects the two bones of the lower leg. Syndesmosis sprains usually only occur with very severe ankle sprains. SYMPTOMS  Pain, tenderness, and swelling in the ankle, starting at the side of injury that may progress to the whole ankle and foot with time.  "Pop" or tearing sensation at the time of injury.  Bruising  that may spread to the heel.  Impaired ability to walk soon after injury. CAUSES   Acute ankle sprains are caused by trauma placed on the ankle that temporarily forces or pries the anklebone (talus) out of its normal socket.  Stretching or tearing of the ligaments that normally hold the joint in place (usually due to a twisting injury). RISK INCREASES WITH:  Previous  ankle sprain.  Sports in which the foot may land awkwardly (i.e., basketball, volleyball, or soccer) or walking or running on uneven or rough surfaces.  Shoes with inadequate support to prevent sideways motion when stress occurs.  Poor strength and flexibility.  Poor balance skills.  Contact sports. PREVENTION   Warm up and stretch properly before activity.  Maintain physical fitness:  Ankle and leg flexibility, muscle strength, and endurance.  Cardiovascular fitness.  Balance training activities.  Use proper technique and have a coach correct improper technique.  Taping, protective strapping, bracing, or high-top tennis shoes may help prevent injury. Initially, tape is best; however, it loses most of its support function within 10 to 15 minutes.  Wear proper-fitted protective shoes (High-top shoes with taping or bracing is more effective than either alone).  Provide the ankle with support during sports and practice activities for 12 months following injury. PROGNOSIS   If treated properly, ankle sprains can be expected to recover completely; however, the length of recovery depends on the degree of injury.  A grade 1 sprain usually heals enough in 5 to 7 days to allow modified activity and requires an average of 6 weeks to heal completely.  A grade 2 sprain requires 6 to 10 weeks to heal completely.  A grade 3 sprain requires 12 to 16 weeks to heal.  A syndesmosis sprain often takes more than 3 months to heal. RELATED COMPLICATIONS   Frequent recurrence of symptoms may result in a chronic problem. Appropriately addressing the problem the first time decreases the frequency of recurrence and optimizes healing time. Severity of the initial sprain does not predict the likelihood of later instability.  Injury to other structures (bone, cartilage, or tendon).  A chronically unstable or arthritic ankle joint is a possibility with repeated sprains. TREATMENT Treatment  initially involves the use of ice, medication, and compression bandages to help reduce pain and inflammation. Ankle sprains are usually immobilized in a walking cast or boot to allow for healing. Crutches may be recommended to reduce pressure on the injury. After immobilization, strengthening and stretching exercises may be necessary to regain strength and a full range of motion. Surgery is rarely needed to treat ankle sprains. MEDICATION   Nonsteroidal anti-inflammatory medications, such as aspirin and ibuprofen (do not take for the first 3 days after injury or within 7 days before surgery), or other minor pain relievers, such as acetaminophen, are often recommended. Take these as directed by your caregiver. Contact your caregiver immediately if any bleeding, stomach upset, or signs of an allergic reaction occur from these medications.  Ointments applied to the skin may be helpful.  Pain relievers may be prescribed as necessary by your caregiver. Do not take prescription pain medication for longer than 4 to 7 days. Use only as directed and only as much as you need. HEAT AND COLD  Cold treatment (icing) is used to relieve pain and reduce inflammation for acute and chronic cases. Cold should be applied for 10 to 15 minutes every 2 to 3 hours for inflammation and pain and immediately after any activity that aggravates your symptoms.  Use ice packs or an ice massage.  Heat treatment may be used before performing stretching and strengthening activities prescribed by your caregiver. Use a heat pack or a warm soak. SEEK IMMEDIATE MEDICAL CARE IF:   Pain, swelling, or bruising worsens despite treatment.  You experience pain, numbness, discoloration, or coldness in the foot or toes.  New, unexplained symptoms develop (drugs used in treatment may produce side effects.) EXERCISES  PHASE I EXERCISES RANGE OF MOTION (ROM) AND STRETCHING EXERCISES - Ankle Sprain, Acute Phase I, Weeks 1 to 2 These exercises  may help you when beginning to restore flexibility in your ankle. You will likely work on these exercises for the 1 to 2 weeks after your injury. Once your physician, physical therapist, or athletic trainer sees adequate progress, he or she will advance your exercises. While completing these exercises, remember:   Restoring tissue flexibility helps normal motion to return to the joints. This allows healthier, less painful movement and activity.  An effective stretch should be held for at least 30 seconds.  A stretch should never be painful. You should only feel a gentle lengthening or release in the stretched tissue. RANGE OF MOTION - Dorsi/Plantar Flexion  While sitting with your right / left knee straight, draw the top of your foot upwards by flexing your ankle. Then reverse the motion, pointing your toes downward.  Hold each position for __________ seconds.  After completing your first set of exercises, repeat this exercise with your knee bent. Repeat __________ times. Complete this exercise __________ times per day.  RANGE OF MOTION - Ankle Alphabet  Imagine your right / left big toe is a pen.  Keeping your hip and knee still, write out the entire alphabet with your "pen." Make the letters as large as you can without increasing any discomfort. Repeat __________ times. Complete this exercise __________ times per day.  STRENGTHENING EXERCISES - Ankle Sprain, Acute -Phase I, Weeks 1 to 2 These exercises may help you when beginning to restore strength in your ankle. You will likely work on these exercises for 1 to 2 weeks after your injury. Once your physician, physical therapist, or athletic trainer sees adequate progress, he or she will advance your exercises. While completing these exercises, remember:   Muscles can gain both the endurance and the strength needed for everyday activities through controlled exercises.  Complete these exercises as instructed by your physician, physical  therapist, or athletic trainer. Progress the resistance and repetitions only as guided.  You may experience muscle soreness or fatigue, but the pain or discomfort you are trying to eliminate should never worsen during these exercises. If this pain does worsen, stop and make certain you are following the directions exactly. If the pain is still present after adjustments, discontinue the exercise until you can discuss the trouble with your clinician. STRENGTH - Dorsiflexors  Secure a rubber exercise band/tubing to a fixed object (i.e., table, pole) and loop the other end around your right / left foot.  Sit on the floor facing the fixed object. The band/tubing should be slightly tense when your foot is relaxed.  Slowly draw your foot back toward you using your ankle and toes.  Hold this position for __________ seconds. Slowly release the tension in the band and return your foot to the starting position. Repeat __________ times. Complete this exercise __________ times per day.  STRENGTH - Plantar-flexors   Sit with your right / left leg extended. Holding onto both ends of a rubber exercise band/tubing,  loop it around the ball of your foot. Keep a slight tension in the band.  Slowly push your toes away from you, pointing them downward.  Hold this position for __________ seconds. Return slowly, controlling the tension in the band/tubing. Repeat __________ times. Complete this exercise __________ times per day.  STRENGTH - Ankle Eversion  Secure one end of a rubber exercise band/tubing to a fixed object (table, pole). Loop the other end around your foot just before your toes.  Place your fists between your knees. This will focus your strengthening at your ankle.  Drawing the band/tubing across your opposite foot, slowly, pull your little toe out and up. Make sure the band/tubing is positioned to resist the entire motion.  Hold this position for __________ seconds. Have your muscles resist the  band/tubing as it slowly pulls your foot back to the starting position.  Repeat __________ times. Complete this exercise __________ times per day.  STRENGTH - Ankle Inversion  Secure one end of a rubber exercise band/tubing to a fixed object (table, pole). Loop the other end around your foot just before your toes.  Place your fists between your knees. This will focus your strengthening at your ankle.  Slowly, pull your big toe up and in, making sure the band/tubing is positioned to resist the entire motion.  Hold this position for __________ seconds.  Have your muscles resist the band/tubing as it slowly pulls your foot back to the starting position. Repeat __________ times. Complete this exercises __________ times per day.  STRENGTH - Towel Curls  Sit in a chair positioned on a non-carpeted surface.  Place your right / left foot on a towel, keeping your heel on the floor.  Pull the towel toward your heel by only curling your toes. Keep your heel on the floor.  If instructed by your physician, physical therapist, or athletic trainer, add weight to the end of the towel. Repeat __________ times. Complete this exercise __________ times per day.   This information is not intended to replace advice given to you by your health care provider. Make sure you discuss any questions you have with your health care provider.   Document Released: 05/01/2005 Document Revised: 10/21/2014 Document Reviewed: 01/12/2009 Elsevier Interactive Patient Education [employer].

## 2016-05-16 NOTE — Progress Notes (Signed)
Patient ID: Rebekah Pace, female   DOB: 21-Feb-1956, 60 y.o.   MRN: [id number]  Assessment and Plan:  Hypertension:  -Continue medication,  -monitor blood pressure at home.  -Continue DASH diet.   -Reminder to go to the ER if any CP, SOB, nausea, dizziness, severe HA, changes vision/speech, left arm numbness and tingling, and jaw pain.  Cholesterol: -Continue diet and exercise.  -Check cholesterol.   Vitamin D Def: -check level -continue medications.   Chronic Hepatitis C -followup with ID, follow up in oct ,has completed course  Bipolar with schizoaffective tendencies -cont lithium -followed by psych -lithium level  Hypothyroidism -check TSH level, continue medications the same, reminded to take on an empty stomach 30-103mins before food.   Morbid Obesity with co morbidities - long discussion about weight loss, diet, and exercise  Left ankle pain Some warmth, swelling, check for uric acid If negative likely sprain or tendinopathy, will refer to ortho RICE,compression sleeve, avoid NSAIDS   Diarrhea Benign AB, Check labs, avoid lactulose and fatty foods, add probiotic  AKI Recheck kidney function, avoid NSAIDS, increase fluids.   Continue diet and meds as discussed. Further disposition pending results of labs.  HPI 60 y.o. female  presents for 3 month follow up with hypertension, hyperlipidemia, prediabetes and vitamin D.  She has had left ankle pain x 1 week, walks daily for 38mins, worse with walking. Medial left ankle pain with walking, with movement, some mild swelling, no warmth, effusion, no injury.   Her blood pressure has been controlled at home, today their BP is BP: 130/66.   She does workout. She denies chest pain, shortness of breath, dizziness.  She is walking daily for 48mins. She is actively trying to lose some weight.  No new shoes.   She is not on cholesterol medication and denies myalgias. Her cholesterol is at goal. The cholesterol last visit was:    Lab Results  Component Value Date   CHOL 180 01/30/2016   HDL 24 (L) 01/30/2016   LDLCALC 110 01/30/2016   TRIG 228 (H) 01/30/2016   CHOLHDL 7.5 (H) 01/30/2016   Last A1C in the office was:  Lab Results  Component Value Date   HGBA1C 4.8 01/30/2016    Patient is on Vitamin D supplement.  Lab Results  Component Value Date   VD25OH 18 (L) 01/30/2016     She has been seeing an infectious disease specialist due to chronic hep C.   She is on thyroid medication. Her medication was changed last visit.   Lab Results  Component Value Date   TSH 4.04 01/30/2016   BMI is Body mass index is 31.76 kg/m., she is working on diet and exercise. Wt Readings from Last 3 Encounters:  05/16/16 185 lb (83.9 kg)  02/16/16 178 lb 9.6 oz (81 kg)  01/31/16 183 lb 10.3 oz (83.3 kg)    Current Medications:  Current Outpatient Prescriptions on File Prior to Visit  Medication Sig Dispense Refill  . levothyroxine (SYNTHROID, LEVOTHROID) 75 MCG tablet TAKE 1 TABLET BY MOUTH EVERY DAY 90 tablet 1  . lithium 300 MG tablet Take 300 mg by mouth at bedtime.     . sertraline (ZOLOFT) 100 MG tablet takes 1 tablet by mouth once daily  0  . Sofosbuvir-Velpatasvir (EPCLUSA) 400-100 MG TABS Take 1 tablet by mouth daily. Reported on 02/15/2016    . thiothixene (NAVANE) 5 MG capsule Take 5 mg by mouth 2 (two) times daily.    . trihexyphenidyl (ARTANE)  5 MG tablet Take 5 mg by mouth at bedtime.     No current facility-administered medications on file prior to visit.     Medical History:  Past Medical History:  Diagnosis Date  . Anxiety   . Depression   . Hepatitis   . Hypothyroidism   . Labile hypertension   . Mental disorder   . Prediabetes   . Thyroid disease     Allergies:  Allergies  Allergen Reactions  . Sulfa Antibiotics Rash     Review of Systems:  Review of Systems  Constitutional: Negative for chills, fever and malaise/fatigue.  HENT: Negative for congestion, ear pain and sore throat.    Respiratory: Negative for cough, sputum production, shortness of breath and wheezing.   Cardiovascular: Negative for chest pain, palpitations and leg swelling.  Gastrointestinal: Positive for diarrhea. Negative for abdominal pain, blood in stool, constipation, heartburn and melena.  Genitourinary: Negative.   Musculoskeletal: Positive for joint pain.  Skin: Negative.   Neurological: Negative for dizziness, sensory change, loss of consciousness and headaches.  Psychiatric/Behavioral: Negative for depression. The patient is not nervous/anxious and does not have insomnia.     Family history- Review and unchanged  Social history- Review and unchanged  Physical Exam: BP 130/66   Pulse 84   Temp 97.3 F (36.3 C) (Temporal)   Resp 16   Ht 5\' 4"  (1.626 m)   Wt 185 lb (83.9 kg)   SpO2 97%   BMI 31.76 kg/m  Wt Readings from Last 3 Encounters:  05/16/16 185 lb (83.9 kg)  02/16/16 178 lb 9.6 oz (81 kg)  01/31/16 183 lb 10.3 oz (83.3 kg)    General Appearance: Well nourished well developed, in no apparent distress. Eyes: PERRLA, EOMs, conjunctiva no swelling or erythema ENT/Mouth: Ear canals normal without obstruction, swelling, erythma, discharge.  TMs normal bilaterally.  Oropharynx moist, clear, without exudate, or postoropharyngeal swelling. Neck: Supple, thyroid normal,no cervical adenopathy  Respiratory: Respiratory effort normal, Breath sounds clear A&P without rhonchi, wheeze, or rale.  No retractions, no accessory usage. Cardio: RRR with no MRGs. Brisk peripheral pulses without edema.  Abdomen: Soft, + BS,  Non tender, no guarding, rebound, hernias, masses. Musculoskeletal: Full ROM, 5/5 strength, Normal gait, left medial ankle with some mild swelling and tenderness to mallelous, with some warmth, pain with plantar abduction,no effusion and no ligamentous laxity.  Skin: Warm, dry without rashes, lesions, ecchymosis.  Neuro: Awake and oriented X 3, Cranial nerves intact. Normal  muscle tone, no cerebellar symptoms. Psych: Normal affect, Insight and Judgment appropriate.    Vicie Mutters, PA-C 11:30 AM [facility]

## 2016-05-17 ENCOUNTER — Other Ambulatory Visit: Payer: Self-pay | Admitting: Physician Assistant

## 2016-05-17 ENCOUNTER — Encounter: Payer: Self-pay | Admitting: Physician Assistant

## 2016-05-17 DIAGNOSIS — M109 Gout, unspecified: Secondary | ICD-10-CM

## 2016-05-17 HISTORY — DX: Gout, unspecified: M10.9

## 2016-05-17 LAB — VITAMIN D 25 HYDROXY (VIT D DEFICIENCY, FRACTURES): Vit D, 25-Hydroxy: 15 ng/mL — ABNORMAL LOW (ref 30–100)

## 2016-05-17 LAB — SEDIMENTATION RATE: Sed Rate: 5 mm/hr (ref 0–30)

## 2016-05-17 MED ORDER — PREDNISONE 20 MG PO TABS: ORAL_TABLET | ORAL | 0 refills | Status: DC

## 2016-05-17 MED ORDER — ALLOPURINOL 300 MG PO TABS: 300.0000 mg | ORAL_TABLET | Freq: Every day | ORAL | 2 refills | Status: DC

## 2016-05-27 DIAGNOSIS — B182 Chronic viral hepatitis C: Secondary | ICD-10-CM | POA: Diagnosis not present

## 2016-06-21 ENCOUNTER — Ambulatory Visit (INDEPENDENT_AMBULATORY_CARE_PROVIDER_SITE_OTHER): Payer: PPO | Admitting: Physician Assistant

## 2016-06-21 ENCOUNTER — Encounter: Payer: Self-pay | Admitting: Physician Assistant

## 2016-06-21 VITALS — BP 124/68 | HR 68 | Temp 97.7°F | Resp 14 | Ht 64.0 in | Wt 186.0 lb

## 2016-06-21 DIAGNOSIS — N179 Acute kidney failure, unspecified: Secondary | ICD-10-CM

## 2016-06-21 DIAGNOSIS — M109 Gout, unspecified: Secondary | ICD-10-CM

## 2016-06-21 LAB — CBC WITH DIFFERENTIAL/PLATELET
BASOS PCT: 1 %
Basophils Absolute: 79 cells/uL (ref 0–200)
EOS PCT: 5 %
Eosinophils Absolute: 395 cells/uL (ref 15–500)
HCT: 39.8 % (ref 35.0–45.0)
HEMOGLOBIN: 13.3 g/dL (ref 11.7–15.5)
LYMPHS ABS: 2212 {cells}/uL (ref 850–3900)
Lymphocytes Relative: 28 %
MCH: 29.6 pg (ref 27.0–33.0)
MCHC: 33.4 g/dL (ref 32.0–36.0)
MCV: 88.4 fL (ref 80.0–100.0)
MONOS PCT: 9 %
MPV: 9.6 fL (ref 7.5–12.5)
Monocytes Absolute: 711 cells/uL (ref 200–950)
Neutro Abs: 4503 cells/uL (ref 1500–7800)
Neutrophils Relative %: 57 %
PLATELETS: 211 10*3/uL (ref 140–400)
RBC: 4.5 MIL/uL (ref 3.80–5.10)
RDW: 13.6 % (ref 11.0–15.0)
WBC: 7.9 10*3/uL (ref 3.8–10.8)

## 2016-06-21 LAB — BASIC METABOLIC PANEL WITH GFR
BUN: 13 mg/dL (ref 7–25)
CHLORIDE: 106 mmol/L (ref 98–110)
CO2: 23 mmol/L (ref 20–31)
CREATININE: 1.02 mg/dL — AB (ref 0.50–0.99)
Calcium: 9.7 mg/dL (ref 8.6–10.4)
GFR, EST NON AFRICAN AMERICAN: 60 mL/min (ref >=60)
GFR, Est African American: 69 mL/min (ref >=60)
Glucose, Bld: 82 mg/dL (ref 65–99)
Potassium: 4.2 mmol/L (ref 3.5–5.3)
SODIUM: 138 mmol/L (ref 135–146)

## 2016-06-21 LAB — URIC ACID: URIC ACID, SERUM: 7.9 mg/dL — AB (ref 2.5–7.0)

## 2016-06-21 NOTE — Patient Instructions (Signed)
Fatty Liver Fatty liver is the accumulation of fat in liver cells. It is also called hepatosteatosis or steatohepatitis. It is normal for your liver to contain some fat. If fat is more than 5 to 10% of your liver's weight, you have fatty liver.  There are often no symptoms (problems) for years while damage is still occurring. People often learn about their fatty liver when they have medical tests for other reasons. Fat can damage your liver for years or even decades without causing problems. When it becomes severe, it can cause fatigue, weight loss, weakness, and confusion. This makes you more likely to develop more serious liver problems. The liver is the largest organ in the body. It does a lot of work and often gives no warning signs when it is sick until late in a disease. The liver has many important jobs including:  Breaking down foods.  Storing vitamins, iron, and other minerals.  Making proteins.  Making bile for food digestion.  Breaking down many products including medications, alcohol and some poisons.  PROGNOSIS  Fatty liver may cause no damage or it can lead to an inflammation of the liver. This is, called steatohepatitis.  Over time the liver may become scarred and hardened. This condition is called cirrhosis. Cirrhosis is serious and may lead to liver failure or cancer. NASH is one of the leading causes of cirrhosis. About 10-20% of Americans have fatty liver and a smaller 2-5% has NASH.  TREATMENT   Weight loss, fat restriction, and exercise in overweight patients produces inconsistent results but is worth trying.  Good control of diabetes may reduce fatty liver.  Eat a balanced, healthy diet.  Increase your physical activity.  There are no medical or surgical treatments for a fatty liver or NASH, but improving your diet and increasing your exercise may help prevent or reverse some of the damage.     Gout Gout is an inflammatory arthritis caused by a buildup of uric  acid crystals in the joints. Uric acid is a chemical that is normally present in the blood. When the level of uric acid in the blood is too high it can form crystals that deposit in your joints and tissues. This causes joint redness, soreness, and swelling (inflammation). Repeat attacks are common. Over time, uric acid crystals can form into masses (tophi) near a joint, destroying bone and causing disfigurement. Gout is treatable and often preventable. CAUSES  The disease begins with elevated levels of uric acid in the blood. Uric acid is produced by your body when it breaks down a naturally found substance called purines. Certain foods you eat, such as meats and fish, contain high amounts of purines. Causes of an elevated uric acid level include:  Being passed down from parent to child (heredity).  Diseases that cause increased uric acid production (such as obesity, psoriasis, and certain cancers).  Excessive alcohol use.  Diet, especially diets rich in meat and seafood.  Medicines, including certain cancer-fighting medicines (chemotherapy), water pills (diuretics), and aspirin.  Chronic kidney disease. The kidneys are no longer able to remove uric acid well.  Problems with metabolism. Conditions strongly associated with gout include:  Obesity.  High blood pressure.  High cholesterol.  Diabetes. Not everyone with elevated uric acid levels gets gout. It is not understood why some people get gout and others do not. Surgery, joint injury, and eating too much of certain foods are some of the factors that can lead to gout attacks. SYMPTOMS   An attack  of gout comes on quickly. It causes intense pain with redness, swelling, and warmth in a joint.  Fever can occur.  Often, only one joint is involved. Certain joints are more commonly involved:  Base of the big toe.  Knee.  Ankle.  Wrist.  Finger. Without treatment, an attack usually goes away in a few days to weeks. Between  attacks, you usually will not have symptoms, which is different from many other forms of arthritis. DIAGNOSIS  Your caregiver will suspect gout based on your symptoms and exam. In some cases, tests may be recommended. The tests may include:  Blood tests.  Urine tests.  X-rays.  Joint fluid exam. This exam requires a needle to remove fluid from the joint (arthrocentesis). Using a microscope, gout is confirmed when uric acid crystals are seen in the joint fluid. TREATMENT  There are two phases to gout treatment: treating the sudden onset (acute) attack and preventing attacks (prophylaxis).  Treatment of an Acute Attack.  Medicines are used. These include anti-inflammatory medicines or steroid medicines.  An injection of steroid medicine into the affected joint is sometimes necessary.  The painful joint is rested. Movement can worsen the arthritis.  You may use warm or cold treatments on painful joints, depending which works best for you.  Treatment to Prevent Attacks.  If you suffer from frequent gout attacks, your caregiver may advise preventive medicine. These medicines are started after the acute attack subsides. These medicines either help your kidneys eliminate uric acid from your body or decrease your uric acid production. You may need to stay on these medicines for a very long time.  The early phase of treatment with preventive medicine can be associated with an increase in acute gout attacks. For this reason, during the first few months of treatment, your caregiver may also advise you to take medicines usually used for acute gout treatment. Be sure you understand your caregiver's directions. Your caregiver may make several adjustments to your medicine dose before these medicines are effective.  Discuss dietary treatment with your caregiver or dietitian. Alcohol and drinks high in sugar and fructose and foods such as meat, poultry, and seafood can increase uric acid levels. Your  caregiver or dietitian can advise you on drinks and foods that should be limited. HOME CARE INSTRUCTIONS   Do not take aspirin to relieve pain. This raises uric acid levels.  Only take over-the-counter or prescription medicines for pain, discomfort, or fever as directed by your caregiver.  Rest the joint as much as possible. When in bed, keep sheets and blankets off painful areas.  Keep the affected joint raised (elevated).  Apply warm or cold treatments to painful joints. Use of warm or cold treatments depends on which works best for you.  Use crutches if the painful joint is in your leg.  Drink enough fluids to keep your urine clear or pale yellow. This helps your body get rid of uric acid. Limit alcohol, sugary drinks, and fructose drinks.  Follow your dietary instructions. Pay careful attention to the amount of protein you eat. Your daily diet should emphasize fruits, vegetables, whole grains, and fat-free or low-fat milk products. Discuss the use of coffee, vitamin C, and cherries with your caregiver or dietitian. These may be helpful in lowering uric acid levels.  Maintain a healthy body weight. SEEK MEDICAL CARE IF:   You develop diarrhea, vomiting, or any side effects from medicines.  You do not feel better in 24 hours, or you are getting worse.  SEEK IMMEDIATE MEDICAL CARE IF:   Your joint becomes suddenly more tender, and you have chills or a fever. MAKE SURE YOU:   Understand these instructions.  Will watch your condition.  Will get help right away if you are not doing well or get worse.   This information is not intended to replace advice given to you by your health care provider. Make sure you discuss any questions you have with your health care provider.   Document Released: 09/27/2000 Document Revised: 10/21/2014 Document Reviewed: 05/13/2012 Elsevier Interactive Patient Education [employer].

## 2016-06-21 NOTE — Progress Notes (Signed)
Assessment and Plan: 1. Acute renal failure, unspecified acute renal failure type ([place]) Increase fluids, lengthy discussion about kidney function and labs - BASIC METABOLIC PANEL WITH GFR  2. Morbid obesity due to excess calories ([facility]) Discussed fatty liver and best way to reverse fibrosis and damage is with weight loss.   3. Gout without tophus, unspecified cause, unspecified chronicity, unspecified site Patient declines meds at this time, want to try diet and cherry extract/tumeric, better at this time, will check labs, if uric acid still elevated will add on allopurinol 100mg , lowest effective dose.  - CBC with Differential/Platelet - BASIC METABOLIC PANEL WITH GFR - Uric acid    HPI 60 y.o.female presents for 1 month follow up for her ankle.  Last visit she complained of medial left ankle pain and swelling x 1 week without injury, her uric acid was elevated so she was placed on allopurinol and is here for recheck of her ankle and uric acid. She states she did not start the allopurinol due to fear of her liver and kidney function, she did start taking go-outplex and vinegar. She states her pain is better.   She also has history of AKI/CKD so needs her kidney function rechecked.  Lab Results  Component Value Date   LABURIC 7.4 (H) 05/16/2016   Lab Results  Component Value Date   CREATININE 1.04 (H) 05/16/2016   BUN 14 05/16/2016   NA 138 05/16/2016   K 4.0 05/16/2016   CL 105 05/16/2016   CO2 25 05/16/2016    Lab Results  Component Value Date   GFRNONAA 5 (L) 05/16/2016    BMI is Body mass index is 31.93 kg/m., she is working on diet and exercise. Wt Readings from Last 3 Encounters:  06/21/16 186 lb (84.4 kg)  05/16/16 185 lb (83.9 kg)  02/16/16 178 lb 9.6 oz (81 kg)    Past Medical History:  Diagnosis Date  . Anxiety   . Depression   . Gout 05/17/2016  . Hepatitis   . Hypothyroidism   . Labile hypertension   . Mental disorder   . Prediabetes   . Thyroid  disease      Allergies  Allergen Reactions  . Sulfa Antibiotics Rash      Current Outpatient Prescriptions on File Prior to Visit  Medication Sig Dispense Refill  . levothyroxine (SYNTHROID, LEVOTHROID) 75 MCG tablet TAKE 1 TABLET BY MOUTH EVERY DAY 90 tablet 1  . lithium 300 MG tablet Take 300 mg by mouth at bedtime.     . sertraline (ZOLOFT) 100 MG tablet takes 1 tablet by mouth once daily  0  . Sofosbuvir-Velpatasvir (EPCLUSA) 400-100 MG TABS Take 1 tablet by mouth daily. Reported on 02/15/2016    . thiothixene (NAVANE) 5 MG capsule Take 5 mg by mouth 2 (two) times daily.    . trihexyphenidyl (ARTANE) 5 MG tablet Take 5 mg by mouth at bedtime.     No current facility-administered medications on file prior to visit.     ROS: all negative except above.   Physical Exam: Filed Weights   06/21/16 1012  Weight: 186 lb (84.4 kg)   BP 124/68   Pulse 68   Temp 97.7 F (36.5 C)   Resp 14   Ht 5\' 4"  (1.626 m)   Wt 186 lb (84.4 kg)   SpO2 97%   BMI 31.93 kg/m  General Appearance: Well nourished, in no apparent distress. Eyes: PERRLA, EOMs, conjunctiva no swelling or erythema Sinuses: No Frontal/maxillary  tenderness ENT/Mouth: Ext aud canals clear, TMs without erythema, bulging. No erythema, swelling, or exudate on post pharynx.  Tonsils not swollen or erythematous. Hearing normal.  Neck: Supple, thyroid normal.  Respiratory: Respiratory effort normal, BS equal bilaterally without rales, rhonchi, wheezing or stridor.  Cardio: RRR with no MRGs. Brisk peripheral pulses without edema.  Abdomen: Soft, + BS.  Non tender, no guarding, rebound, hernias, masses. Lymphatics: Non tender without lymphadenopathy.  Musculoskeletal: Full ROM, 5/5 strength, normal gait.  Skin: Warm, dry without rashes, lesions, ecchymosis.  Neuro: Cranial nerves intact. Normal muscle tone, no cerebellar symptoms. Sensation intact.  Psych: Awake and oriented X 3, normal affect, Insight and Judgment appropriate.      Vicie Mutters, PA-C 10:28 AM [facility]

## 2016-06-24 ENCOUNTER — Telehealth: Payer: Self-pay

## 2016-06-24 NOTE — Telephone Encounter (Signed)
Spoke w/pt about her uric acid being high & that the next time she comes in the office we will do lab work & if her numbers have not gotten any lower from diet & supplements then Estill Bamberg would like to treat w/ meds to get those uric acid number down.

## 2016-07-31 DIAGNOSIS — B182 Chronic viral hepatitis C: Secondary | ICD-10-CM | POA: Diagnosis not present

## 2016-08-07 DIAGNOSIS — B182 Chronic viral hepatitis C: Secondary | ICD-10-CM | POA: Diagnosis not present

## 2016-08-07 DIAGNOSIS — K740 Hepatic fibrosis: Secondary | ICD-10-CM | POA: Diagnosis not present

## 2016-08-08 ENCOUNTER — Other Ambulatory Visit: Payer: Self-pay | Admitting: Nurse Practitioner

## 2016-08-08 DIAGNOSIS — K7469 Other cirrhosis of liver: Secondary | ICD-10-CM

## 2016-08-19 ENCOUNTER — Ambulatory Visit
Admission: RE | Admit: 2016-08-19 | Discharge: 2016-08-19 | Disposition: A | Discharge status: Home / Self Care | Payer: PPO | Source: Ambulatory Visit | Attending: Nurse Practitioner | Admitting: Nurse Practitioner

## 2016-08-19 DIAGNOSIS — K824 Cholesterolosis of gallbladder: Secondary | ICD-10-CM | POA: Diagnosis not present

## 2016-08-19 DIAGNOSIS — K7469 Other cirrhosis of liver: Secondary | ICD-10-CM

## 2016-08-20 ENCOUNTER — Ambulatory Visit (INDEPENDENT_AMBULATORY_CARE_PROVIDER_SITE_OTHER): Payer: PPO | Admitting: Internal Medicine

## 2016-08-20 ENCOUNTER — Encounter: Payer: Self-pay | Admitting: Internal Medicine

## 2016-08-20 VITALS — BP 132/76 | HR 72 | Temp 97.4°F | Resp 16 | Ht 64.25 in | Wt 186.2 lb

## 2016-08-20 DIAGNOSIS — E782 Mixed hyperlipidemia: Secondary | ICD-10-CM

## 2016-08-20 DIAGNOSIS — Z79899 Other long term (current) drug therapy: Secondary | ICD-10-CM | POA: Diagnosis not present

## 2016-08-20 DIAGNOSIS — Z136 Encounter for screening for cardiovascular disorders: Secondary | ICD-10-CM | POA: Diagnosis not present

## 2016-08-20 DIAGNOSIS — M1000 Idiopathic gout, unspecified site: Secondary | ICD-10-CM | POA: Diagnosis not present

## 2016-08-20 DIAGNOSIS — F315 Bipolar disorder, current episode depressed, severe, with psychotic features: Secondary | ICD-10-CM | POA: Diagnosis not present

## 2016-08-20 DIAGNOSIS — I10 Essential (primary) hypertension: Secondary | ICD-10-CM | POA: Diagnosis not present

## 2016-08-20 DIAGNOSIS — E032 Hypothyroidism due to medicaments and other exogenous substances: Secondary | ICD-10-CM | POA: Diagnosis not present

## 2016-08-20 DIAGNOSIS — Z1212 Encounter for screening for malignant neoplasm of rectum: Secondary | ICD-10-CM

## 2016-08-20 DIAGNOSIS — R7303 Prediabetes: Secondary | ICD-10-CM | POA: Diagnosis not present

## 2016-08-20 DIAGNOSIS — R0989 Other specified symptoms and signs involving the circulatory and respiratory systems: Secondary | ICD-10-CM

## 2016-08-20 DIAGNOSIS — E559 Vitamin D deficiency, unspecified: Secondary | ICD-10-CM | POA: Diagnosis not present

## 2016-08-20 LAB — LIPID PANEL
CHOL/HDL RATIO: 6.9 ratio — AB (ref <5.0)
Cholesterol: 222 mg/dL — ABNORMAL HIGH (ref <200)
HDL: 32 mg/dL — ABNORMAL LOW (ref >50)
LDL Cholesterol: 155 mg/dL — ABNORMAL HIGH
Triglycerides: 176 mg/dL — ABNORMAL HIGH (ref <150)
VLDL: 35 mg/dL — ABNORMAL HIGH (ref <30)

## 2016-08-20 LAB — CBC WITH DIFFERENTIAL/PLATELET
BASOS ABS: 78 {cells}/uL (ref 0–200)
Basophils Relative: 1 %
EOS PCT: 5 %
Eosinophils Absolute: 390 cells/uL (ref 15–500)
HCT: 39.6 % (ref 35.0–45.0)
HEMOGLOBIN: 13.1 g/dL (ref 11.7–15.5)
LYMPHS ABS: 2184 {cells}/uL (ref 850–3900)
Lymphocytes Relative: 28 %
MCH: 28.9 pg (ref 27.0–33.0)
MCHC: 33.1 g/dL (ref 32.0–36.0)
MCV: 87.2 fL (ref 80.0–100.0)
MPV: 9.5 fL (ref 7.5–12.5)
Monocytes Absolute: 702 cells/uL (ref 200–950)
Monocytes Relative: 9 %
NEUTROS ABS: 4446 {cells}/uL (ref 1500–7800)
Neutrophils Relative %: 57 %
Platelets: 245 10*3/uL (ref 140–400)
RBC: 4.54 MIL/uL (ref 3.80–5.10)
RDW: 13.8 % (ref 11.0–15.0)
WBC: 7.8 10*3/uL (ref 3.8–10.8)

## 2016-08-20 LAB — URIC ACID: URIC ACID, SERUM: 8.1 mg/dL — AB (ref 2.5–7.0)

## 2016-08-20 LAB — BASIC METABOLIC PANEL WITH GFR
BUN: 16 mg/dL (ref 7–25)
CHLORIDE: 105 mmol/L (ref 98–110)
CO2: 20 mmol/L (ref 20–31)
Calcium: 9.8 mg/dL (ref 8.6–10.4)
Creat: 1.12 mg/dL — ABNORMAL HIGH (ref 0.50–0.99)
GFR, EST NON AFRICAN AMERICAN: 53 mL/min — AB (ref >=60)
GFR, Est African American: 61 mL/min (ref >=60)
Glucose, Bld: 88 mg/dL (ref 65–99)
POTASSIUM: 4.1 mmol/L (ref 3.5–5.3)
SODIUM: 137 mmol/L (ref 135–146)

## 2016-08-20 LAB — HEPATIC FUNCTION PANEL
ALK PHOS: 77 U/L (ref 33–130)
ALT: 15 U/L (ref 6–29)
AST: 21 U/L (ref 10–35)
Albumin: 4.5 g/dL (ref 3.6–5.1)
BILIRUBIN DIRECT: 0.1 mg/dL (ref <=0.2)
BILIRUBIN INDIRECT: 0.4 mg/dL (ref 0.2–1.2)
Total Bilirubin: 0.5 mg/dL (ref 0.2–1.2)
Total Protein: 7.6 g/dL (ref 6.1–8.1)

## 2016-08-20 LAB — TSH: TSH: 2.15 m[IU]/L

## 2016-08-20 LAB — MAGNESIUM: Magnesium: 2 mg/dL (ref 1.5–2.5)

## 2016-08-20 NOTE — Progress Notes (Signed)
[facility] Unk Pinto, M.D.    Uvaldo Bristle. Silverio Lay, P.A.-C      Starlyn Skeans, P.A.-C  [facility]                [address] [place], [place] [id number] Telephone [phone] Telefax [phone]  Annual Screening/Preventative Visit & Comprehensive Evaluation &  Examination     This very nice 60 y.o. DWF presents for a Screening/Preventative Visit & comprehensive evaluation and management of multiple medical co-morbidities.  Patient has been followed for HTN, Prediabetes, Hyperlipidemia and Vitamin D Deficiency. Patient also has hx/o Bipolar Manic-Depressive Affective Disorder with Psychosis and seems stable on current med regimen. She had a Psychotic break in 2013 and had several psych admissions and is followed by [facility] by a staff therapist & Psychiatrist to manage her psychotropic medications. Other problems include hx/o Hepatitis and she has completed treatment and blood titers are cleared of infection! 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       Patient has hx/o labile HTN predating circa 2005 and is followed expectantly. Patient's BP has been controlled at home and patient denies any cardiac symptoms as chest pain, palpitations, shortness of breath, dizziness or ankle swelling. Today's BP is  132/76.      Patient's hyperlipidemia is controlled with diet and medications. Patient denies myalgias or other medication SE's. Last lipids were not at goal:  Lab Results  Component Value Date   CHOL 199 05/16/2016   HDL 36 (L) 05/16/2016   LDLCALC 119 05/16/2016   TRIG 221 (H) 05/16/2016   CHOLHDL 5.5 (H) 05/16/2016      Patient has been on Thyroid replacement since 2002.  Patient has Morbid Obesity (BMI 31+) and consequent prediabetes predating since with A1c 5.5% and Insulin 35 in Jan 2014 and  patient denies reactive hypoglycemic symptoms, visual blurring, diabetic polys, or paresthesias. Last A1c was at goal and normal: Lab Results  Component Value Date   HGBA1C 4.8 01/30/2016      Finally, patient has history of Vitamin D Deficiency and last Vitamin D was still very low: Lab Results  Component Value Date   VD25OH 15 (L) 05/16/2016   Current Outpatient Prescriptions on File Prior to Visit  Medication Sig  . levothyroxine75 MCG tablet TAKE 1 TABLET BY MOUTH EVERY DAY  . lithium 300 MG tablet Take 300 mg by mouth at bedtime.   . sertraline (ZOLOFT) 100 MG tablet takes 1 tablet by mouth once daily  . thiothixene (NAVANE) 5 MG capsule Take 5 mg by mouth 2 (two) times daily.  . trihexyphenidyl (ARTANE) 5 MG tablet Take 5 mg by mouth at bedtime.   Allergies  Allergen Reactions  . Sulfa Antibiotics Rash   Past Medical History:  Diagnosis Date  . Anxiety   . Depression   . Gout 05/17/2016  . Hepatitis   . Hypothyroidism   . Labile hypertension   . Mental disorder   . Prediabetes   . Thyroid disease    Health Maintenance  Topic Date Due  . HIV Screening  05/21/1970  . COLONOSCOPY  05/21/2005  . INFLUENZA VACCINE  05/14/2016  . ZOSTAVAX  03/26/2017 (Originally 05/22/2015)  . PAP SMEAR  04/03/2017 (Originally 05/21/1976)  . TETANUS/TDAP  08/08/2017 (Originally 10/14/2005)  .  MAMMOGRAM  02/12/2018  . Hepatitis C Screening  Completed   Immunization History  Administered Date(s) Administered  . Pneumococcal-Unspecified 10/14/2001  . Td 10/15/1995   Past Surgical History:  Procedure Laterality Date  . arm fracture    . ROTATOR CUFF REPAIR     Family History  Problem Relation Age of Onset  . Hyperlipidemia Mother   . Hypertension Mother   . Colon polyps Mother   . Cancer Father     Lung  . Hyperlipidemia Brother    Social History  Substance Use Topics  . Smoking status: Never Smoker  . Smokeless tobacco: Never Used  . Alcohol use No    ROS Constitutional:  Denies fever, chills, weight loss/gain, headaches, insomnia,  night sweats, and change in appetite. Does c/o fatigue. Eyes: Denies redness, blurred vision, diplopia, discharge, itchy, watery eyes.  ENT: Denies discharge, congestion, post nasal drip, epistaxis, sore throat, earache, hearing loss, dental pain, Tinnitus, Vertigo, Sinus pain, snoring.  Cardio: Denies chest pain, palpitations, irregular heartbeat, syncope, dyspnea, diaphoresis, orthopnea, PND, claudication, edema Respiratory: denies cough, dyspnea, DOE, pleurisy, hoarseness, laryngitis, wheezing.  Gastrointestinal: Denies dysphagia, heartburn, reflux, water brash, pain, cramps, nausea, vomiting, bloating, diarrhea, constipation, hematemesis, melena, hematochezia, jaundice, hemorrhoids Genitourinary: Denies dysuria, frequency, urgency, nocturia, hesitancy, discharge, hematuria, flank pain Breast: Breast lumps, nipple discharge, bleeding.  Musculoskeletal: Denies arthralgia, myalgia, stiffness, Jt. Swelling, pain, limp, and strain/sprain. Denies falls. Skin: Denies puritis, rash, hives, warts, acne, eczema, changing in skin lesion Neuro: No weakness, tremor, incoordination, spasms, paresthesia, pain Psychiatric: Denies confusion, memory loss, sensory loss. Denies Depression. Endocrine: Denies change in weight, skin, hair change, nocturia, and paresthesia, diabetic polys, visual blurring, hyper / hypo glycemic episodes.  Heme/Lymph: No excessive bleeding, bruising, enlarged lymph nodes.  Physical Exam  BP 132/76   Pulse 72   Temp 97.4 F (36.3 C)   Resp 16   Ht 5' 4.25" (1.632 m)   Wt 186 lb 3.2 oz (84.5 kg)   BMI 31.71 kg/m   General Appearance: Well nourished and in no apparent distress.  Eyes: PERRLA, EOMs, conjunctiva no swelling or erythema, normal fundi and vessels. Sinuses: No frontal/maxillary tenderness ENT/Mouth: EACs patent / TMs  nl. Nares clear without erythema, swelling, mucoid exudates. Oral hygiene is good. No  erythema, swelling, or exudate. Tongue normal, non-obstructing. Tonsils not swollen or erythematous. Hearing normal.  Neck: Supple, thyroid normal. No bruits, nodes or JVD. Respiratory: Respiratory effort normal.  BS equal and clear bilateral without rales, rhonci, wheezing or stridor. Cardio: Heart sounds are normal with regular rate and rhythm and no murmurs, rubs or gallops. Peripheral pulses are normal and equal bilaterally without edema. No aortic or femoral bruits. Chest: symmetric with normal excursions and percussion. Breasts: Symmetric, without lumps, nipple discharge, retractions, or fibrocystic changes.  Abdomen: Flat, soft with bowel sounds active. Nontender, no guarding, rebound, hernias, masses, or organomegaly.  Lymphatics: Non tender without lymphadenopathy.  Genitourinary:  Musculoskeletal: Full ROM all peripheral extremities, joint stability, 5/5 strength, and normal gait. Skin: Warm and dry without rashes, lesions, cyanosis, clubbing or  ecchymosis.  Neuro: Cranial nerves intact, reflexes equal bilaterally. Normal muscle tone, no cerebellar symptoms. Sensation intact.  Pysch: Alert and oriented X 3, normal affect, Insight and Judgment appropriate.   Assessment and Plan  1. Labile hypertension  - Microalbumin / creatinine urine ratio - EKG 12-Lead - TSH  2. Mixed hyperlipidemia  - Lipid panel - TSH  3. Prediabetes  - Hemoglobin A1c - Insulin, random  4. Vitamin  D deficiency  - VITAMIN D 25 Hydroxy   5. Hypothyroidism  - TSH  6. Idiopathic gout  - Uric acid  7. Bipolar affective disorder, depressed, severe, with psychotic behavior ([place])  - Lithium level  8. Morbid obesity ([place])   9. Screening for rectal cancer  - POC Hemoccult Bld/Stl   10. Medication management  - Urinalysis, Routine w reflex microscopic  - CBC with Differential/Platelet - BASIC METABOLIC PANEL WITH GFR - Hepatic function panel - Magnesium - Lithium  level       Continue prudent diet as discussed, weight control, BP monitoring, regular exercise, and medications. Discussed med's effects and SE's. Screening labs and tests as requested with regular follow-up as recommended. Over 40 minutes of exam, counseling, chart review and high complex critical decision making was performed.

## 2016-08-20 NOTE — Patient Instructions (Addendum)
Preventive Care for Adults  A healthy lifestyle and preventive care can promote health and wellness. Preventive health guidelines for women include the following key practices.  A routine yearly physical is a good way to check with your health care provider about your health and preventive screening. It is a chance to share any concerns and updates on your health and to receive a thorough exam.  Visit your dentist for a routine exam and preventive care every 6 months. Brush your teeth twice a day and floss once a day. Good oral hygiene prevents tooth decay and gum disease.  The frequency of eye exams is based on your age, health, family medical history, use of contact lenses, and other factors. Follow your health care provider's recommendations for frequency of eye exams.  Eat a healthy diet. Foods like vegetables, fruits, whole grains, low-fat dairy products, and lean protein foods contain the nutrients you need without too many calories. Decrease your intake of foods high in solid fats, added sugars, and salt. Eat the right amount of calories for you.Get information about a proper diet from your health care provider, if necessary.  Regular physical exercise is one of the most important things you can do for your health. Most adults should get at least 150 minutes of moderate-intensity exercise (any activity that increases your heart rate and causes you to sweat) each week. In addition, most adults need muscle-strengthening exercises on 2 or more days a week.  Maintain a healthy weight. The body mass index (BMI) is a screening tool to identify possible weight problems. It provides an estimate of body fat based on height and weight. Your health care provider can find your BMI and can help you achieve or maintain a healthy weight.For adults 20 years and older:  A BMI below 18.5 is considered underweight.  A BMI of 18.5 to 24.9 is normal.  A BMI of 25 to 29.9 is considered overweight.  A BMI of  30 and above is considered obese.  Maintain normal blood lipids and cholesterol levels by exercising and minimizing your intake of saturated fat. Eat a balanced diet with plenty of fruit and vegetables. Blood tests for lipids and cholesterol should begin at age 20 and be repeated every 5 years. If your lipid or cholesterol levels are high, you are over 50, or you are at high risk for heart disease, you may need your cholesterol levels checked more frequently.Ongoing high lipid and cholesterol levels should be treated with medicines if diet and exercise are not working.  If you smoke, find out from your health care provider how to quit. If you do not use tobacco, do not start.  Lung cancer screening is recommended for adults aged 55-80 years who are at high risk for developing lung cancer because of a history of smoking. A yearly low-dose CT scan of the lungs is recommended for people who have at least a 30-pack-year history of smoking and are a current smoker or have quit within the past 15 years. A pack year of smoking is smoking an average of 1 pack of cigarettes a day for 1 year (for example: 1 pack a day for 30 years or 2 packs a day for 15 years). Yearly screening should continue until the smoker has stopped smoking for at least 15 years. Yearly screening should be stopped for people who develop a health problem that would prevent them from having lung cancer treatment.  High blood pressure causes heart disease and increases the risk of   stroke. Your blood pressure should be checked at least every 1 to 2 years. Ongoing high blood pressure should be treated with medicines if weight loss and exercise do not work.  If you are 55-79 years old, ask your health care provider if you should take aspirin to prevent strokes.  Diabetes screening involves taking a blood sample to check your fasting blood sugar level. This should be done once every 3 years, after age 45, if you are within normal weight and  without risk factors for diabetes. Testing should be considered at a younger age or be carried out more frequently if you are overweight and have at least 1 risk factor for diabetes.  Breast cancer screening is essential preventive care for women. You should practice "breast self-awareness." This means understanding the normal appearance and feel of your breasts and may include breast self-examination. Any changes detected, no matter how small, should be reported to a health care provider. Women in their 20s and 30s should have a clinical breast exam (CBE) by a health care provider as part of a regular health exam every 1 to 3 years. After age 40, women should have a CBE every year. Starting at age 40, women should consider having a mammogram (breast X-ray test) every year. Women who have a family history of breast cancer should talk to their health care provider about genetic screening. Women at a high risk of breast cancer should talk to their health care providers about having an MRI and a mammogram every year.  Breast cancer gene (BRCA)-related cancer risk assessment is recommended for women who have family members with BRCA-related cancers. BRCA-related cancers include breast, ovarian, tubal, and peritoneal cancers. Having family members with these cancers may be associated with an increased risk for harmful changes (mutations) in the breast cancer genes BRCA1 and BRCA2. Results of the assessment will determine the need for genetic counseling and BRCA1 and BRCA2 testing.  Routine pelvic exams to screen for cancer are no longer recommended for nonpregnant women who are considered low risk for cancer of the pelvic organs (ovaries, uterus, and vagina) and who do not have symptoms. Ask your health care provider if a screening pelvic exam is right for you.  If you have had past treatment for cervical cancer or a condition that could lead to cancer, you need Pap tests and screening for cancer for at least 20  years after your treatment. If Pap tests have been discontinued, your risk factors (such as having a new sexual partner) need to be reassessed to determine if screening should be resumed. Some women have medical problems that increase the chance of getting cervical cancer. In these cases, your health care provider may recommend more frequent screening and Pap tests.  Colorectal cancer can be detected and often prevented. Most routine colorectal cancer screening begins at the age of 50 years and continues through age 75 years. However, your health care provider may recommend screening at an earlier age if you have risk factors for colon cancer. On a yearly basis, your health care provider may provide home test kits to check for hidden blood in the stool. Use of a small camera at the end of a tube, to directly examine the colon (sigmoidoscopy or colonoscopy), can detect the earliest forms of colorectal cancer. Talk to your health care provider about this at age 50, when routine screening begins. Direct exam of the colon should be repeated every 5-10 years through age 75 years, unless early forms of pre-cancerous   polyps or small growths are found.  Hepatitis C blood testing is recommended for all people born from [birth year] through 1965 and any individual with known risks for hepatitis C.  Pra  Osteoporosis is a disease in which the bones lose minerals and strength with aging. This can result in serious bone fractures or breaks. The risk of osteoporosis can be identified using a bone density scan. Women ages 11 years and over and women at risk for fractures or osteoporosis should discuss screening with their health care providers. Ask your health care provider whether you should take a calcium supplement or vitamin D to reduce the rate of osteoporosis.  Menopause can be associated with physical symptoms and risks. Hormone replacement therapy is available to decrease symptoms and risks. You should talk to your  health care provider about whether hormone replacement therapy is right for you.  Use sunscreen. Apply sunscreen liberally and repeatedly throughout the day. You should seek shade when your shadow is shorter than you. Protect yourself by wearing long sleeves, pants, a wide-brimmed hat, and sunglasses year round, whenever you are outdoors.  Once a month, do a whole body skin exam, using a mirror to look at the skin on your back. Tell your health care provider of new moles, moles that have irregular borders, moles that are larger than a pencil eraser, or moles that have changed in shape or color.  Stay current with required vaccines (immunizations).  Influenza vaccine. All adults should be immunized every year.  Tetanus, diphtheria, and acellular pertussis (Td, Tdap) vaccine. Pregnant women should receive 1 dose of Tdap vaccine during each pregnancy. The dose should be obtained regardless of the length of time since the last dose. Immunization is preferred during the 27th-36th week of gestation. An adult who has not previously received Tdap or who does not know her vaccine status should receive 1 dose of Tdap. This initial dose should be followed by tetanus and diphtheria toxoids (Td) booster doses every 10 years. Adults with an unknown or incomplete history of completing a 3-dose immunization series with Td-containing vaccines should begin or complete a primary immunization series including a Tdap dose. Adults should receive a Td booster every 10 years.  Varicella vaccine. An adult without evidence of immunity to varicella should receive 2 doses or a second dose if she has previously received 1 dose. Pregnant females who do not have evidence of immunity should receive the first dose after pregnancy. This first dose should be obtained before leaving the health care facility. The second dose should be obtained 4-8 weeks after the first dose.  Human papillomavirus (HPV) vaccine. Females aged 13-26 years  who have not received the vaccine previously should obtain the 3-dose series. The vaccine is not recommended for use in pregnant females. However, pregnancy testing is not needed before receiving a dose. If a female is found to be pregnant after receiving a dose, no treatment is needed. In that case, the remaining doses should be delayed until after the pregnancy. Immunization is recommended for any person with an immunocompromised condition through the age of 49 years if she did not get any or all doses earlier. During the 3-dose series, the second dose should be obtained 4-8 weeks after the first dose. The third dose should be obtained 24 weeks after the first dose and 16 weeks after the second dose.  Zoster vaccine. One dose is recommended for adults aged 87 years or older unless certain conditions are present.  Measles, mumps, and rubella (  MMR) vaccine. Adults born before [birth year] generally are considered immune to measles and mumps. Adults born in [birth year] or later should have 1 or more doses of MMR vaccine unless there is a contraindication to the vaccine or there is laboratory evidence of immunity to each of the three diseases. A routine second dose of MMR vaccine should be obtained at least 28 days after the first dose for students attending postsecondary schools, health care workers, or international travelers. People who received inactivated measles vaccine or an unknown type of measles vaccine during 1963-1967 should receive 2 doses of MMR vaccine. People who received inactivated mumps vaccine or an unknown type of mumps vaccine before 1979 and are at high risk for mumps infection should consider immunization with 2 doses of MMR vaccine. For females of childbearing age, rubella immunity should be determined. If there is no evidence of immunity, females who are not pregnant should be vaccinated. If there is no evidence of immunity, females who are pregnant should delay immunization until after pregnancy.  Unvaccinated health care workers born before [birth year] who lack laboratory evidence of measles, mumps, or rubella immunity or laboratory confirmation of disease should consider measles and mumps immunization with 2 doses of MMR vaccine or rubella immunization with 1 dose of MMR vaccine.  Pneumococcal 13-valent conjugate (PCV13) vaccine. When indicated, a person who is uncertain of her immunization history and has no record of immunization should receive the PCV13 vaccine. An adult aged 39 years or older who has certain medical conditions and has not been previously immunized should receive 1 dose of PCV13 vaccine. This PCV13 should be followed with a dose of pneumococcal polysaccharide (PPSV23) vaccine. The PPSV23 vaccine dose should be obtained at least 8 weeks after the dose of PCV13 vaccine. An adult aged 62 years or older who has certain medical conditions and previously received 1 or more doses of PPSV23 vaccine should receive 1 dose of PCV13. The PCV13 vaccine dose should be obtained 1 or more years after the last PPSV23 vaccine dose.    Pneumococcal polysaccharide (PPSV23) vaccine. When PCV13 is also indicated, PCV13 should be obtained first. All adults aged 67 years and older should be immunized. An adult younger than age 45 years who has certain medical conditions should be immunized. Any person who resides in a nursing home or long-term care facility should be immunized. An adult smoker should be immunized. People with an immunocompromised condition and certain other conditions should receive both PCV13 and PPSV23 vaccines. People with human immunodeficiency virus (HIV) infection should be immunized as soon as possible after diagnosis. Immunization during chemotherapy or radiation therapy should be avoided. Routine use of PPSV23 vaccine is not recommended for American Indians, [place] Natives, or people younger than 65 years unless there are medical conditions that require PPSV23 vaccine. When indicated,  people who have unknown immunization and have no record of immunization should receive PPSV23 vaccine. One-time revaccination 5 years after the first dose of PPSV23 is recommended for people aged 19-64 years who have chronic kidney failure, nephrotic syndrome, asplenia, or immunocompromised conditions. People who received 1-2 doses of PPSV23 before age 23 years should receive another dose of PPSV23 vaccine at age 35 years or later if at least 5 years have passed since the previous dose. Doses of PPSV23 are not needed for people immunized with PPSV23 at or after age 38 years.  Preventive Services / Frequency   Ages 43 to 86 years  Blood pressure check.  Lipid and cholesterol check.  Lung  cancer screening. / Every year if you are aged 64-80 years and have a 30-pack-year history of smoking and currently smoke or have quit within the past 15 years. Yearly screening is stopped once you have quit smoking for at least 15 years or develop a health problem that would prevent you from having lung cancer treatment.  Clinical breast exam.** / Every year after age 36 years.  BRCA-related cancer risk assessment.** / For women who have family members with a BRCA-related cancer (breast, ovarian, tubal, or peritoneal cancers).  Mammogram.** / Every year beginning at age 109 years and continuing for as long as you are in good health. Consult with your health care provider.  Pap test.** / Every 3 years starting at age 39 years through age 65 or 28 years with a history of 3 consecutive normal Pap tests.  HPV screening.** / Every 3 years from ages 91 years through ages 32 to 78 years with a history of 3 consecutive normal Pap tests.  Fecal occult blood test (FOBT) of stool. / Every year beginning at age 26 years and continuing until age 12 years. You may not need to do this test if you get a colonoscopy every 10 years.  Flexible sigmoidoscopy or colonoscopy.** / Every 5 years for a flexible sigmoidoscopy or  every 10 years for a colonoscopy beginning at age 34 years and continuing until age 60 years.  Hepatitis C blood test.** / For all people born from [birth year] through 1965 and any individual with known risks for hepatitis C.  Skin self-exam. / Monthly.  Influenza vaccine. / Every year.  Tetanus, diphtheria, and acellular pertussis (Tdap/Td) vaccine.** / Consult your health care provider. Pregnant women should receive 1 dose of Tdap vaccine during each pregnancy. 1 dose of Td every 10 years.  Varicella vaccine.** / Consult your health care provider. Pregnant females who do not have evidence of immunity should receive the first dose after pregnancy.  Zoster vaccine.** / 1 dose for adults aged 11 years or older.  Pneumococcal 13-valent conjugate (PCV13) vaccine.** / Consult your health care provider.  Pneumococcal polysaccharide (PPSV23) vaccine.** / 1 to 2 doses if you smoke cigarettes or if you have certain conditions.  Meningococcal vaccine.** / Consult your health care provider.  Hepatitis A vaccine.** / Consult your health care provider.  Hepatitis B vaccine.** / Consult your health care provider. Screening for abdominal aortic aneurysm (AAA)  by ultrasound is recommended for people over 50 who have history of high blood pressure or who are current or former smokers. ++++++++++++++++++ Recommend Adult Low Dose Aspirin or  coated  Aspirin 81 mg daily  To reduce risk of Colon Cancer 20 %,  Skin Cancer 26 % ,  Melanoma 46%  and  Pancreatic cancer 60% +++++++++++++++++++ Vitamin D goal  is between 70-100.  Please make sure that you are taking your Vitamin D as directed.  It is very important as a natural anti-inflammatory  helping hair, skin, and nails, as well as reducing stroke and heart attack risk.  It helps your bones and helps with mood. It also decreases numerous cancer risks so please take it as directed.  Low Vit D is associated with a 200-300% higher risk for CANCER  and  200-300% higher risk for HEART   ATTACK  &  STROKE.   .....................................Marland Kitchen It is also associated with higher death rate at younger ages,  autoimmune diseases like Rheumatoid arthritis, Lupus, Multiple Sclerosis.    Also many other serious conditions, like depression,  Alzheimer's Dementia, infertility, muscle aches, fatigue, fibromyalgia - just to name a few. ++++++++++++++++++ Recommend the book "The END of DIETING" by Dr Excell Seltzer  & the book "The END of DIABETES " by Dr Excell Seltzer At [email or web address] - get book & Audio CD's    Being diabetic has a  300% increased risk for heart attack, stroke, cancer, and alzheimer- type vascular dementia. It is very important that you work harder with diet by avoiding all foods that are white. Avoid white rice (brown & wild rice is OK), white potatoes (sweetpotatoes in moderation is OK), White bread or wheat bread or anything made out of white flour like bagels, donuts, rolls, buns, biscuits, cakes, pastries, cookies, pizza crust, and pasta (made from white flour & egg whites) - vegetarian pasta or spinach or wheat pasta is OK. Multigrain breads like Arnold's or Pepper[employer], or multigrain sandwich thins or flatbreads.  Diet, exercise and weight loss can reverse and cure diabetes in the early stages.  Diet, exercise and weight loss is very important in the control and prevention of complications of diabetes which affects every system in your body, ie. Brain - dementia/stroke, eyes - glaucoma/blindness, heart - heart attack/heart failure, kidneys - dialysis, stomach - gastric paralysis, intestines - malabsorption, nerves - severe painful neuritis, circulation - gangrene & loss of a leg(s), and finally cancer and Alzheimers.    I recommend avoid fried & greasy foods,  sweets/candy, white rice (brown or wild rice or Quinoa is OK), white potatoes (sweet potatoes are OK) - anything made from white flour - bagels, doughnuts, rolls, buns, biscuits,white  and wheat breads, pizza crust and traditional pasta made of white flour & egg white(vegetarian pasta or spinach or wheat pasta is OK).  Multi-grain bread is OK - like multi-grain flat bread or sandwich thins. Avoid alcohol in excess. Exercise is also important.    Eat all the vegetables you want - avoid meat, especially red meat and dairy - especially cheese.  Cheese is the most concentrated form of trans-fats which is the worst thing to clog up our arteries. Veggie cheese is OK which can be found in the fresh produce section at [employer] or [employer] or [employer]  ++++++++++++++++++++++ DASH Eating Plan  DASH stands for "Dietary Approaches to Stop Hypertension."   The DASH eating plan is a healthy eating plan that has been shown to reduce high blood pressure (hypertension). Additional health benefits may include reducing the risk of type 2 diabetes mellitus, heart disease, and stroke. The DASH eating plan may also help with weight loss. WHAT DO I NEED TO KNOW ABOUT THE DASH EATING PLAN? For the DASH eating plan, you will follow these general guidelines:  Choose foods with a percent daily value for sodium of less than 5% (as listed on the food label).  Use salt-free seasonings or herbs instead of table salt or sea salt.  Check with your health care provider or pharmacist before using salt substitutes.  Eat lower-sodium products, often labeled as "lower sodium" or "no salt added."  Eat fresh foods.  Eat more vegetables, fruits, and low-fat dairy products.  Choose whole grains. Look for the word "whole" as the first word in the ingredient list.  Choose fish   Limit sweets, desserts, sugars, and sugary drinks.  Choose heart-healthy fats.  Eat veggie cheese   Eat more home-cooked food and less restaurant, buffet, and fast food.  Limit fried foods.  Cook foods using methods other than frying.  Limit canned  vegetables. If you do use them, rinse them well to decrease the  sodium.  When eating at a restaurant, ask that your food be prepared with less salt, or no salt if possible.                      WHAT FOODS CAN I EAT? Read Dr Fara Olden Fuhrman's books on The End of Dieting & The End of Diabetes  Grains Whole grain or whole wheat bread. Brown rice. Whole grain or whole wheat pasta. Quinoa, bulgur, and whole grain cereals. Low-sodium cereals. Corn or whole wheat flour tortillas. Whole grain cornbread. Whole grain crackers. Low-sodium crackers.  Vegetables Fresh or frozen vegetables (raw, steamed, roasted, or grilled). Low-sodium or reduced-sodium tomato and vegetable juices. Low-sodium or reduced-sodium tomato sauce and paste. Low-sodium or reduced-sodium canned vegetables.   Fruits All fresh, canned (in natural juice), or frozen fruits.  Protein Products  All fish and seafood.  Dried beans, peas, or lentils. Unsalted nuts and seeds. Unsalted canned beans.  Dairy Low-fat dairy products, such as skim or 1% milk, 2% or reduced-fat cheeses, low-fat ricotta or cottage cheese, or plain low-fat yogurt. Low-sodium or reduced-sodium cheeses.  Fats and Oils Tub margarines without trans fats. Light or reduced-fat mayonnaise and salad dressings (reduced sodium). Avocado. Safflower, olive, or canola oils. Natural peanut or almond butter.  Other Unsalted popcorn and pretzels. The items listed above may not be a complete list of recommended foods or beverages. Contact your dietitian for more options.  ++++++++++++++++++  WHAT FOODS ARE NOT RECOMMENDED? Grains/ White flour or wheat flour White bread. White pasta. White rice. Refined cornbread. Bagels and croissants. Crackers that contain trans fat.  Vegetables  Creamed or fried vegetables. Vegetables in a . Regular canned vegetables. Regular canned tomato sauce and paste. Regular tomato and vegetable juices.  Fruits Dried fruits. Canned fruit in light or heavy syrup. Fruit juice.  Meat and Other Protein  Products Meat in general - RED meat & White meat.  Fatty cuts of meat. Ribs, chicken wings, all processed meats as bacon, sausage, bologna, salami, fatback, hot dogs, bratwurst and packaged luncheon meats.  Dairy Whole or 2% milk, cream, half-and-half, and cream cheese. Whole-fat or sweetened yogurt. Full-fat cheeses or blue cheese. Non-dairy creamers and whipped toppings. Processed cheese, cheese spreads, or cheese curds.  Condiments Onion and garlic salt, seasoned salt, table salt, and sea salt. Canned and packaged gravies. Worcestershire sauce. Tartar sauce. Barbecue sauce. Teriyaki sauce. Soy sauce, including reduced sodium. Steak sauce. Fish sauce. Oyster sauce. Cocktail sauce. Horseradish. Ketchup and mustard. Meat flavorings and tenderizers. Bouillon cubes. Hot sauce. Tabasco sauce. Marinades. Taco seasonings. Relishes.  Fats and Oils Butter, stick margarine, lard, shortening and bacon fat. Coconut, palm kernel, or palm oils. Regular salad dressings.  Pickles and olives. Salted popcorn and pretzels.  The items listed above may not be a complete list of foods and beverages to avoid.

## 2016-08-21 LAB — URINALYSIS, MICROSCOPIC ONLY
Crystals: NONE SEEN [HPF]
YEAST: NONE SEEN [HPF]

## 2016-08-21 LAB — MICROALBUMIN / CREATININE URINE RATIO
Creatinine, Urine: 239 mg/dL (ref 20–320)
Microalb Creat Ratio: 25 mcg/mg creat (ref <30)
Microalb, Ur: 6 mg/dL

## 2016-08-21 LAB — INSULIN, RANDOM: Insulin: 13.2 u[IU]/mL (ref 2.0–19.6)

## 2016-08-21 LAB — HEMOGLOBIN A1C
HEMOGLOBIN A1C: 4.7 % (ref <5.7)
Mean Plasma Glucose: 88 mg/dL

## 2016-08-21 LAB — URINALYSIS, ROUTINE W REFLEX MICROSCOPIC
BILIRUBIN URINE: NEGATIVE
Glucose, UA: NEGATIVE
Ketones, ur: NEGATIVE
Nitrite: NEGATIVE
SPECIFIC GRAVITY, URINE: 1.017 (ref 1.001–1.035)
pH: 6 (ref 5.0–8.0)

## 2016-08-21 LAB — VITAMIN D 25 HYDROXY (VIT D DEFICIENCY, FRACTURES): Vit D, 25-Hydroxy: 15 ng/mL — ABNORMAL LOW (ref 30–100)

## 2016-08-21 LAB — LITHIUM LEVEL: Lithium Lvl: 0.5 mmol/L — ABNORMAL LOW (ref 0.6–1.2)

## 2016-10-05 ENCOUNTER — Other Ambulatory Visit: Payer: Self-pay | Admitting: Internal Medicine

## 2016-11-14 DIAGNOSIS — Z79899 Other long term (current) drug therapy: Secondary | ICD-10-CM | POA: Diagnosis not present

## 2016-11-22 ENCOUNTER — Ambulatory Visit (INDEPENDENT_AMBULATORY_CARE_PROVIDER_SITE_OTHER): Payer: PPO | Admitting: Internal Medicine

## 2016-11-22 VITALS — BP 122/76 | HR 76 | Temp 97.1°F | Resp 16 | Ht 64.25 in | Wt 190.6 lb

## 2016-11-22 DIAGNOSIS — R7303 Prediabetes: Secondary | ICD-10-CM

## 2016-11-22 DIAGNOSIS — E782 Mixed hyperlipidemia: Secondary | ICD-10-CM | POA: Diagnosis not present

## 2016-11-22 DIAGNOSIS — M1000 Idiopathic gout, unspecified site: Secondary | ICD-10-CM

## 2016-11-22 DIAGNOSIS — Z79899 Other long term (current) drug therapy: Secondary | ICD-10-CM | POA: Diagnosis not present

## 2016-11-22 DIAGNOSIS — I10 Essential (primary) hypertension: Secondary | ICD-10-CM

## 2016-11-22 DIAGNOSIS — E039 Hypothyroidism, unspecified: Secondary | ICD-10-CM | POA: Diagnosis not present

## 2016-11-22 DIAGNOSIS — E559 Vitamin D deficiency, unspecified: Secondary | ICD-10-CM | POA: Diagnosis not present

## 2016-11-22 DIAGNOSIS — R0989 Other specified symptoms and signs involving the circulatory and respiratory systems: Secondary | ICD-10-CM

## 2016-11-22 DIAGNOSIS — K7400 Hepatic fibrosis, unspecified: Secondary | ICD-10-CM

## 2016-11-22 LAB — BASIC METABOLIC PANEL WITH GFR
BUN: 15 mg/dL (ref 7–25)
CALCIUM: 10.2 mg/dL (ref 8.6–10.4)
CO2: 25 mmol/L (ref 20–31)
CREATININE: 0.96 mg/dL (ref 0.50–0.99)
Chloride: 105 mmol/L (ref 98–110)
GFR, Est African American: 74 mL/min (ref >=60)
GFR, Est Non African American: 64 mL/min (ref >=60)
GLUCOSE: 105 mg/dL — AB (ref 65–99)
Potassium: 4.3 mmol/L (ref 3.5–5.3)
Sodium: 140 mmol/L (ref 135–146)

## 2016-11-22 LAB — CBC WITH DIFFERENTIAL/PLATELET
Basophils Absolute: 77 cells/uL (ref 0–200)
Basophils Relative: 1 %
Eosinophils Absolute: 308 cells/uL (ref 15–500)
Eosinophils Relative: 4 %
HEMATOCRIT: 41.4 % (ref 35.0–45.0)
Hemoglobin: 13.8 g/dL (ref 11.7–15.5)
LYMPHS PCT: 28 %
Lymphs Abs: 2156 cells/uL (ref 850–3900)
MCH: 27.9 pg (ref 27.0–33.0)
MCHC: 33.3 g/dL (ref 32.0–36.0)
MCV: 83.8 fL (ref 80.0–100.0)
MONO ABS: 693 {cells}/uL (ref 200–950)
MPV: 9.7 fL (ref 7.5–12.5)
Monocytes Relative: 9 %
NEUTROS PCT: 58 %
Neutro Abs: 4466 cells/uL (ref 1500–7800)
Platelets: 246 10*3/uL (ref 140–400)
RBC: 4.94 MIL/uL (ref 3.80–5.10)
RDW: 14.4 % (ref 11.0–15.0)
WBC: 7.7 10*3/uL (ref 3.8–10.8)

## 2016-11-22 LAB — LIPID PANEL
CHOLESTEROL: 213 mg/dL — AB (ref <200)
HDL: 31 mg/dL — ABNORMAL LOW (ref >50)
LDL Cholesterol: 129 mg/dL — ABNORMAL HIGH (ref <100)
TRIGLYCERIDES: 265 mg/dL — AB (ref <150)
Total CHOL/HDL Ratio: 6.9 Ratio — ABNORMAL HIGH (ref <5.0)
VLDL: 53 mg/dL — ABNORMAL HIGH (ref <30)

## 2016-11-22 LAB — HEPATIC FUNCTION PANEL
ALBUMIN: 4.4 g/dL (ref 3.6–5.1)
ALT: 19 U/L (ref 6–29)
AST: 23 U/L (ref 10–35)
Alkaline Phosphatase: 95 U/L (ref 33–130)
Bilirubin, Direct: 0.1 mg/dL (ref <=0.2)
Indirect Bilirubin: 0.4 mg/dL (ref 0.2–1.2)
TOTAL PROTEIN: 7.7 g/dL (ref 6.1–8.1)
Total Bilirubin: 0.5 mg/dL (ref 0.2–1.2)

## 2016-11-22 LAB — TSH: TSH: 3.08 mIU/L

## 2016-11-22 NOTE — Patient Instructions (Signed)

## 2016-11-23 ENCOUNTER — Encounter: Payer: Self-pay | Admitting: Internal Medicine

## 2016-11-23 DIAGNOSIS — K7400 Hepatic fibrosis, unspecified: Secondary | ICD-10-CM | POA: Insufficient documentation

## 2016-11-23 DIAGNOSIS — K7469 Other cirrhosis of liver: Secondary | ICD-10-CM | POA: Insufficient documentation

## 2016-11-23 LAB — VITAMIN D 25 HYDROXY (VIT D DEFICIENCY, FRACTURES): Vit D, 25-Hydroxy: 27 ng/mL — ABNORMAL LOW (ref 30–100)

## 2016-11-23 LAB — MAGNESIUM: MAGNESIUM: 1.7 mg/dL (ref 1.5–2.5)

## 2016-11-23 LAB — URIC ACID: URIC ACID, SERUM: 7.5 mg/dL — AB (ref 2.5–7.0)

## 2016-11-23 LAB — HEMOGLOBIN A1C
Hgb A1c MFr Bld: 5 % (ref <5.7)
MEAN PLASMA GLUCOSE: 97 mg/dL

## 2016-11-23 NOTE — Progress Notes (Signed)
[facility] Unk Pinto, M.D.        Uvaldo Bristle. Silverio Lay, P.A.-C       Starlyn Skeans, P.A.-C  [facility]                [address] [place], [place] [id number] Telephone [phone] Telefax [phone] ______________________________________________________________________     This very nice 61 y.o. DWF presents for 6 month follow up with Hypertension, Hyperlipidemia, Pre-Diabetes and Vitamin D Deficiency.  This past year (2017) patient completed a 12 wk curative course for Hepatitis C, but unfortunately has F2-F3 Fibrosis by U/S Elastography and F4 Fibrosis by Liver Fibrotest and will continue biannual f/u at the [facility] clinic for hepatoma surveillance.  She is aware of the urgent need for weight loss with respect to her Morbid Obesity and steatohepatitis.      Patient also has pertinent hx/o Bipolar Manic Depressive Disorder with hx/o Schizophrenia from a Psychotic Break in 2013 with several Psych admissions and she continues Psych f/u for management of her antipsychotic medications. Her Psych therapists have recently d/c'd her Lithium and tapered her Navane and Sertraline and patient relates the she feels mentally much clearer.      Patient  Has hx/o labile HTN (2005) monitored expectantly & BP has been controlled at home. Today's BP is at goal - 122/76. Patient has had no complaints of any cardiac type chest pain, palpitations, dyspnea/orthopnea/PND, dizziness, claudication, or dependent edema.     Hyperlipidemia is not controlled with diet, but given her underlying liver disease she is encouraged stronger attempt at control with diet. .  Current  Lipids are not at goal: Lab Results  Component Value Date   CHOL 213 (H) 11/22/2016   HDL 31 (L) 11/22/2016   LDLCALC 129 (H) 11/22/2016   TRIG 265 (H) 11/22/2016   CHOLHDL 6.9 (H) 11/22/2016      Also, the patient has history of  Morbid Obesity (BMI 32+) and consequent PreDiabetes and has had no symptoms of reactive hypoglycemia, diabetic polys, paresthesias or visual blurring.   Current A1c was at goal:  Lab Results  Component Value Date   HGBA1C 5.0 11/22/2016      Patient was dx'd  Hypothyroid in 2002 and has been on replacement therapy since.  Further, the patient also has history of Vitamin D Deficiency and supplements vitamin D without any suspected side-effects. Current vitamin D is not at goal: Lab Results  Component Value Date   VD25OH 27 (L) 11/22/2016   Current Outpatient Prescriptions on File Prior to Visit  Medication Sig  . allopurinol  300 MG tablet Takes 1 x/daily   . levothyroxine 75 MCG tablet TAKE 1 TAB EVERY DAY  . sertraline (ZOLOFT) 100 MG tablet takes 1/2 tab  once daily  . thiothixene (NAVANE) 2 MG capsule Take  2 times daily.    Allergies  Allergen Reactions  . Sulfa Antibiotics Rash   PMHx:   Past Medical History:  Diagnosis Date  . Anxiety   . Depression   . Gout 05/17/2016  . Hepatitis   . Hypothyroidism   . Labile hypertension   . Mental disorder   . Prediabetes   . Thyroid disease    Immunization History  Administered Date(s) Administered  . Pneumococcal-Unspecified 10/14/2001  . Td 10/15/1995   Past Surgical History:  Procedure Laterality Date  . arm  fracture    . ROTATOR CUFF REPAIR     FHx:    Reviewed / unchanged  SHx:    Reviewed / unchanged  Systems Review:  Constitutional: Denies fever, chills, wt changes, headaches, insomnia, fatigue, night sweats, change in appetite. Eyes: Denies redness, blurred vision, diplopia, discharge, itchy, watery eyes.  ENT: Denies discharge, congestion, post nasal drip, epistaxis, sore throat, earache, hearing loss, dental pain, tinnitus, vertigo, sinus pain, snoring.  CV: Denies chest pain, palpitations, irregular heartbeat, syncope, dyspnea, diaphoresis, orthopnea, PND, claudication or edema. Respiratory: denies cough,  dyspnea, DOE, pleurisy, hoarseness, laryngitis, wheezing.  Gastrointestinal: Denies dysphagia, odynophagia, heartburn, reflux, water brash, abdominal pain or cramps, nausea, vomiting, bloating, diarrhea, constipation, hematemesis, melena, hematochezia  or hemorrhoids. Genitourinary: Denies dysuria, frequency, urgency, nocturia, hesitancy, discharge, hematuria or flank pain. Musculoskeletal: Denies arthralgias, myalgias, stiffness, jt. swelling, pain, limping or strain/sprain.  Skin: Denies pruritus, rash, hives, warts, acne, eczema or change in skin lesion(s). Neuro: No weakness, tremor, incoordination, spasms, paresthesia or pain. Psychiatric: Denies confusion, memory loss or sensory loss. Endo: Denies change in weight, skin or hair change.  Heme/Lymph: No excessive bleeding, bruising or enlarged lymph nodes.  Physical Exam  BP 122/76   Pulse 76   Temp 97.1 F (36.2 C)   Resp 16   Ht 5' 4.25" (1.632 m)   Wt 190 lb 9.6 oz (86.5 kg)   BMI 32.46 kg/m   Appears over nourished and in no distress.  Eyes: PERRLA, EOMs, conjunctiva no swelling or erythema. Sinuses: No frontal/maxillary tenderness ENT/Mouth: EAC's clear, TM's nl w/o erythema, bulging. Nares clear w/o erythema, swelling, exudates. Oropharynx clear without erythema or exudates. Oral hygiene is good. Tongue normal, non obstructing. Hearing intact.  Neck: Supple. Thyroid nl. Car 2+/2+ without bruits, nodes or JVD. Chest: Respirations nl with BS clear & equal w/o rales, rhonchi, wheezing or stridor.  Cor: Heart sounds normal w/ regular rate and rhythm without sig. murmurs, gallops, clicks, or rubs. Peripheral pulses normal and equal  without edema.  Abdomen: Soft, obese & bowel sounds normal. Non-tender w/o guarding, rebound, hernias, masses, or organomegaly.  Lymphatics: Unremarkable.  Musculoskeletal: Full ROM all peripheral extremities, joint stability, 5/5 strength, and normal gait.  Skin: Warm, dry without exposed rashes,  lesions or ecchymosis apparent.  Neuro: Cranial nerves intact, reflexes equal bilaterally. Sensory-motor testing grossly intact. Tendon reflexes grossly intact.  Pysch: Alert & oriented x 3.  Insight and judgement nl & appropriate. No ideations.  Assessment and Plan:  1. Labile hypertension  - Continue medication, monitor blood pressure at home.  - Continue DASH diet. Reminder to go to the ER if any CP,  SOB, nausea, dizziness, severe HA, changes vision/speech,  left arm numbness and tingling and jaw pain.  - CBC with Differential/Platelet - BASIC METABOLIC PANEL WITH GFR - TSH  2. Mixed hyperlipidemia  - Continue diet/meds, exercise,& lifestyle modifications.  - Continue monitor periodic cholesterol/liver & renal functions   - Hepatic function panel - Lipid panel - TSH  3. Prediabetes - Continue diet, exercise, lifestyle modifications.  - Monitor appropriate labs.  - Hemoglobin A1c - Insulin, random  4. Vitamin D deficiency  - Continue supplementation.  - VITAMIN D 25 Hydroxy   5. Hypothyroidism   6. Idiopathic gout  - TSH - Uric acid  7. Medication management  - CBC with Differential/Platelet - BASIC METABOLIC PANEL WITH GFR - Hepatic function panel - Magnesium - Lipid panel - TSH - Hemoglobin A1c - Insulin, random - VITAMIN D 25  Hydroxy  - Uric acid  8. Hepatic fibrosis ([place])       Recommended regular exercise, BP monitoring, weight control, and discussed med and SE's. Recommended labs to assess and monitor clinical status. Further disposition pending results of labs. Over 30 minutes of exam, counseling, chart review was performed

## 2016-11-25 LAB — INSULIN, RANDOM: INSULIN: 39 u[IU]/mL — AB (ref 2.0–19.6)

## 2016-12-30 ENCOUNTER — Encounter: Payer: Self-pay | Admitting: Physician Assistant

## 2016-12-30 ENCOUNTER — Ambulatory Visit (INDEPENDENT_AMBULATORY_CARE_PROVIDER_SITE_OTHER): Payer: PPO | Admitting: Physician Assistant

## 2016-12-30 VITALS — BP 126/80 | HR 97 | Temp 97.2°F | Resp 16 | Ht 64.25 in | Wt 195.2 lb

## 2016-12-30 DIAGNOSIS — J0100 Acute maxillary sinusitis, unspecified: Secondary | ICD-10-CM | POA: Diagnosis not present

## 2016-12-30 MED ORDER — AZITHROMYCIN 250 MG PO TABS: ORAL_TABLET | ORAL | 1 refills | Status: AC

## 2016-12-30 MED ORDER — PROMETHAZINE-DM 6.25-15 MG/5ML PO SYRP: 5.0000 mL | ORAL_SOLUTION | Freq: Four times a day (QID) | ORAL | 1 refills | Status: DC | PRN

## 2016-12-30 MED ORDER — PREDNISONE 20 MG PO TABS: ORAL_TABLET | ORAL | 0 refills | Status: DC

## 2016-12-30 NOTE — Patient Instructions (Signed)

## 2016-12-30 NOTE — Progress Notes (Signed)
Subjective:    Patient ID: Rebekah Pace, female    DOB: 04-27-1956, 61 y.o.   MRN: [id number]  HPI 61 y.o. WF presents with sinus symptoms x 5 days. She has been taking alk plus.   Blood pressure 126/80, pulse 97, temperature 97.2 F (36.2 C), resp. rate 16, height 5' 4.25" (1.632 m), weight 195 lb 3.2 oz (88.5 kg), SpO2 97 %.  Medications Current Outpatient Prescriptions on File Prior to Visit  Medication Sig  . allopurinol (ZYLOPRIM) 300 MG tablet   . levothyroxine (SYNTHROID, LEVOTHROID) 75 MCG tablet TAKE 1 TABLET BY MOUTH EVERY DAY  . sertraline (ZOLOFT) 100 MG tablet takes 1 tablet by mouth once daily  . thiothixene (NAVANE) 2 MG capsule TK 1 C PO TWICE DAILY. DO NOT DRIVE OR OPERATE WITH MACHINERY. DO NOT U ALCOHOL   No current facility-administered medications on file prior to visit.     Problem list She has Schizoaffective disorder, bipolar type ([place]); Major depression in full remission ([place]); Prediabetes; Labile hypertension; Medication management; Mixed hyperlipidemia; Vitamin D deficiency; Bipolar affective disorder, depressed, severe, with psychotic behavior ([place]); Hypothyroidism; BMI 31.84,adult; Morbid obesity ([facility])  (BMI 31.84); HCV (hepatitis C virus); Diarrhea; Gout; and Hepatic fibrosis ([place]) on her problem list.   Review of Systems  Constitutional: Negative for chills and diaphoresis.  HENT: Positive for congestion, postnasal drip, sinus pressure and sneezing. Negative for ear pain and sore throat.   Respiratory: Positive for cough. Negative for chest tightness, shortness of breath and wheezing.   Cardiovascular: Negative.   Gastrointestinal: Negative.   Genitourinary: Negative.   Musculoskeletal: Negative for neck pain.  Neurological: Positive for headaches.       Objective:   Physical Exam  Constitutional: She is oriented to person, place, and time. She appears well-developed and well-nourished.  HENT:  Right Ear: Hearing and external ear normal. No  mastoid tenderness. Tympanic membrane is injected. Tympanic membrane is not perforated, not erythematous, not retracted and not bulging. A middle ear effusion is present.  Left Ear: Hearing and external ear normal. No mastoid tenderness. Tympanic membrane is injected. Tympanic membrane is not perforated, not erythematous, not retracted and not bulging. A middle ear effusion is present.  Nose: Right sinus exhibits maxillary sinus tenderness. Left sinus exhibits maxillary sinus tenderness.  Mouth/Throat: Uvula is midline, oropharynx is clear and moist and mucous membranes are normal.  Eyes: Conjunctivae and EOM are normal. Pupils are equal, round, and reactive to light.  Neck: Neck supple.  Cardiovascular: Normal rate and regular rhythm.   Pulmonary/Chest: Effort normal and breath sounds normal. No respiratory distress. She has no wheezes.  Abdominal: Soft. Bowel sounds are normal.  Musculoskeletal: Normal range of motion.  Lymphadenopathy:    She has no cervical adenopathy.  Neurological: She is alert and oriented to person, place, and time.  Skin: Skin is warm and dry.        Assessment & Plan:  1. Acute maxillary sinusitis, recurrence not specified Will hold the zpak and take if she is not getting better, increase fluids, rest, cont allergy pill - predniSONE (DELTASONE) 20 MG tablet; 2 tablets daily for 3 days, 1 tablet daily for 4 days.  Dispense: 10 tablet; Refill: 0 - azithromycin (ZITHROMAX) 250 MG tablet; Take 2 tablets (500 mg) on  Day 1,  followed by 1 tablet (250 mg) once daily on Days 2 through 5.  Dispense: 6 each; Refill: 1 - promethazine-dextromethorphan (PROMETHAZINE-DM) 6.25-15 MG/5ML syrup; Take 5 mLs by mouth 4 (  four) times daily as needed for cough.  Dispense: 240 mL; Refill: 1

## 2017-01-13 ENCOUNTER — Other Ambulatory Visit: Payer: Self-pay | Admitting: Nurse Practitioner

## 2017-01-13 DIAGNOSIS — K7469 Other cirrhosis of liver: Secondary | ICD-10-CM

## 2017-01-16 ENCOUNTER — Ambulatory Visit: Payer: PPO | Admitting: Internal Medicine

## 2017-01-20 ENCOUNTER — Ambulatory Visit
Admission: RE | Admit: 2017-01-20 | Discharge: 2017-01-20 | Disposition: A | Discharge status: Home / Self Care | Payer: PPO | Source: Ambulatory Visit | Attending: Nurse Practitioner | Admitting: Nurse Practitioner

## 2017-01-20 DIAGNOSIS — K7469 Other cirrhosis of liver: Secondary | ICD-10-CM

## 2017-01-20 DIAGNOSIS — K7460 Unspecified cirrhosis of liver: Secondary | ICD-10-CM | POA: Diagnosis not present

## 2017-01-23 DIAGNOSIS — K760 Fatty (change of) liver, not elsewhere classified: Secondary | ICD-10-CM | POA: Diagnosis not present

## 2017-01-23 DIAGNOSIS — K740 Hepatic fibrosis: Secondary | ICD-10-CM | POA: Diagnosis not present

## 2017-01-28 ENCOUNTER — Emergency Department (HOSPITAL_COMMUNITY): Payer: PPO

## 2017-01-28 ENCOUNTER — Emergency Department (HOSPITAL_COMMUNITY)
Admission: EM | Admit: 2017-01-28 | Discharge: 2017-01-28 | Disposition: A | Discharge status: Home / Self Care | Payer: PPO | Attending: Emergency Medicine | Admitting: Emergency Medicine

## 2017-01-28 ENCOUNTER — Encounter (HOSPITAL_COMMUNITY): Payer: Self-pay | Admitting: Emergency Medicine

## 2017-01-28 ENCOUNTER — Ambulatory Visit: Payer: Self-pay | Admitting: Internal Medicine

## 2017-01-28 DIAGNOSIS — M545 Low back pain, unspecified: Secondary | ICD-10-CM

## 2017-01-28 DIAGNOSIS — N3000 Acute cystitis without hematuria: Secondary | ICD-10-CM

## 2017-01-28 DIAGNOSIS — E039 Hypothyroidism, unspecified: Secondary | ICD-10-CM | POA: Diagnosis not present

## 2017-01-28 DIAGNOSIS — N3090 Cystitis, unspecified without hematuria: Secondary | ICD-10-CM | POA: Diagnosis not present

## 2017-01-28 DIAGNOSIS — Z79899 Other long term (current) drug therapy: Secondary | ICD-10-CM | POA: Insufficient documentation

## 2017-01-28 DIAGNOSIS — R109 Unspecified abdominal pain: Secondary | ICD-10-CM | POA: Diagnosis not present

## 2017-01-28 LAB — URINALYSIS, ROUTINE W REFLEX MICROSCOPIC
Bilirubin Urine: NEGATIVE
GLUCOSE, UA: NEGATIVE mg/dL
Ketones, ur: NEGATIVE mg/dL
NITRITE: NEGATIVE
PH: 6 (ref 5.0–8.0)
Protein, ur: NEGATIVE mg/dL
Specific Gravity, Urine: 1.011 (ref 1.005–1.030)

## 2017-01-28 LAB — BASIC METABOLIC PANEL
Anion gap: 7 (ref 5–15)
BUN: 10 mg/dL (ref 6–20)
CHLORIDE: 105 mmol/L (ref 101–111)
CO2: 25 mmol/L (ref 22–32)
CREATININE: 1 mg/dL (ref 0.44–1.00)
Calcium: 9.6 mg/dL (ref 8.9–10.3)
GFR calc Af Amer: >60 mL/min (ref >60)
GFR, EST NON AFRICAN AMERICAN: 60 mL/min — AB (ref >60)
GLUCOSE: 105 mg/dL — AB (ref 65–99)
POTASSIUM: 4 mmol/L (ref 3.5–5.1)
SODIUM: 137 mmol/L (ref 135–145)

## 2017-01-28 LAB — CBC WITH DIFFERENTIAL/PLATELET
Basophils Absolute: 0.1 10*3/uL (ref 0.0–0.1)
Basophils Relative: 1 %
EOS ABS: 0.4 10*3/uL (ref 0.0–0.7)
EOS PCT: 5 %
HCT: 39.9 % (ref 36.0–46.0)
Hemoglobin: 13.4 g/dL (ref 12.0–15.0)
LYMPHS PCT: 28 %
Lymphs Abs: 2.1 10*3/uL (ref 0.7–4.0)
MCH: 28.6 pg (ref 26.0–34.0)
MCHC: 33.6 g/dL (ref 30.0–36.0)
MCV: 85.3 fL (ref 78.0–100.0)
MONOS PCT: 10 %
Monocytes Absolute: 0.8 10*3/uL (ref 0.1–1.0)
NEUTROS ABS: 4.2 10*3/uL (ref 1.7–7.7)
NEUTROS PCT: 56 %
PLATELETS: 206 10*3/uL (ref 150–400)
RBC: 4.68 MIL/uL (ref 3.87–5.11)
RDW: 14.2 % (ref 11.5–15.5)
WBC: 7.5 10*3/uL (ref 4.0–10.5)

## 2017-01-28 MED ORDER — METHOCARBAMOL 500 MG PO TABS: 500.0000 mg | ORAL_TABLET | Freq: Two times a day (BID) | ORAL | 0 refills | Status: DC

## 2017-01-28 MED ORDER — SODIUM CHLORIDE 0.9 % IV BOLUS (SEPSIS)
1000.0000 mL | Freq: Once | INTRAVENOUS | Status: AC
  Administered 2017-01-28: 1000 mL via INTRAVENOUS

## 2017-01-28 MED ORDER — OXYCODONE-ACETAMINOPHEN 5-325 MG PO TABS: 1.0000 | ORAL_TABLET | Freq: Four times a day (QID) | ORAL | 0 refills | Status: DC | PRN

## 2017-01-28 MED ORDER — MORPHINE SULFATE (PF) 4 MG/ML IV SOLN
4.0000 mg | Freq: Once | INTRAVENOUS | Status: AC
  Administered 2017-01-28: 4 mg via INTRAVENOUS
  Filled 2017-01-28: qty 1

## 2017-01-28 MED ORDER — ONDANSETRON HCL 4 MG/2ML IJ SOLN
4.0000 mg | Freq: Once | INTRAMUSCULAR | Status: AC
Start: 2017-01-28 — End: 2017-01-28
  Administered 2017-01-28: 4 mg via INTRAVENOUS
  Filled 2017-01-28: qty 2

## 2017-01-28 MED ORDER — NAPROXEN 500 MG PO TABS: 500.0000 mg | ORAL_TABLET | Freq: Two times a day (BID) | ORAL | 0 refills | Status: DC

## 2017-01-28 MED ORDER — CEPHALEXIN 500 MG PO CAPS: 500.0000 mg | ORAL_CAPSULE | Freq: Three times a day (TID) | ORAL | 0 refills | Status: DC

## 2017-01-28 NOTE — ED Provider Notes (Signed)
[facility] DEPT Provider Note   CSN: [id number] Arrival date & time: 01/28/17  1023     History   Chief Complaint Chief Complaint  Patient presents with  . Flank Pain    HPI Rebekah Pace is a 61 y.o. female.  HPI  Pt presenting with c/o right flank/back pain.    She states pain began 2 days ago.  She has been uncomfortable at night and having to sleep flat on her back.  Pain is worse with certain positions and certain movements.  She also c/o urinary frequency.  No dysuria.  No fever/chills. No nausea.  No trauma.  No urinary retention. No incontinence of bowel or bladder.  No weakness of legs.  She has not had any treatment prior to arrival.  There are no other associated systemic symptoms, there are no other alleviating or modifying factors.   Past Medical History:  Diagnosis Date  . Anxiety   . Depression   . Gout 05/17/2016  . Hepatitis   . Hypothyroidism   . Labile hypertension   . Mental disorder   . Prediabetes   . Thyroid disease     Patient Active Problem List   Diagnosis Date Noted  . Hepatic fibrosis 11/23/2016  . Gout 05/17/2016  . HCV (hepatitis C virus) 01/31/2016  . Diarrhea 01/31/2016  . BMI 31.84,adult 07/24/2015  . Morbid obesity ([place])  (BMI 31.84) 07/24/2015  . Hypothyroidism 12/13/2014  . Mixed hyperlipidemia 09/01/2014  . Vitamin D deficiency 09/01/2014  . Bipolar affective disorder, depressed, severe, with psychotic behavior ([place]) 09/01/2014  . Medication management 03/30/2014  . Major depression in full remission ([place])   . Prediabetes   . Labile hypertension   . Schizoaffective disorder, bipolar type ([place]) 09/12/2011    Past Surgical History:  Procedure Laterality Date  . arm fracture    . ROTATOR CUFF REPAIR      OB History    No data available       Home Medications    Prior to Admission medications   Medication Sig Start Date End Date Taking? Authorizing Provider  allopurinol (ZYLOPRIM) 300 MG tablet  05/17/16    Historical Provider, MD  cephALEXin (KEFLEX) 500 MG capsule Take 1 capsule (500 mg total) by mouth 3 (three) times daily. 01/28/17   Alfonzo Beers, MD  levothyroxine (SYNTHROID, LEVOTHROID) 75 MCG tablet TAKE 1 TABLET BY MOUTH EVERY DAY 10/05/16   Unk Pinto, MD  MAGNESIUM PO Take by mouth.    Historical Provider, MD  methocarbamol (ROBAXIN) 500 MG tablet Take 1 tablet (500 mg total) by mouth 2 (two) times daily. 01/28/17   Alfonzo Beers, MD  naproxen (NAPROSYN) 500 MG tablet Take 1 tablet (500 mg total) by mouth 2 (two) times daily. 01/28/17   Alfonzo Beers, MD  OVER THE COUNTER MEDICATION     Historical Provider, MD  oxyCODONE-acetaminophen (PERCOCET/ROXICET) 5-325 MG tablet Take 1-2 tablets by mouth every 6 (six) hours as needed for severe pain. 01/28/17   Alfonzo Beers, MD  predniSONE (DELTASONE) 20 MG tablet 2 tablets daily for 3 days, 1 tablet daily for 4 days. 12/30/16   Vicie Mutters, PA-C  promethazine-dextromethorphan (PROMETHAZINE-DM) 6.25-15 MG/5ML syrup Take 5 mLs by mouth 4 (four) times daily as needed for cough. 12/30/16   Vicie Mutters, PA-C  sertraline (ZOLOFT) 100 MG tablet takes 1 tablet by mouth once daily 06/29/15   Historical Provider, MD  thiothixene (NAVANE) 2 MG capsule TK 1 C PO TWICE DAILY. DO NOT DRIVE OR  OPERATE WITH MACHINERY. DO NOT U ALCOHOL 11/21/16   Historical Provider, MD  VITAMIN D, CHOLECALCIFEROL, PO Take by mouth 2 (two) times daily.    Historical Provider, MD    Family History Family History  Problem Relation Age of Onset  . Hyperlipidemia Mother   . Hypertension Mother   . Colon polyps Mother   . Cancer Father     Lung  . Hyperlipidemia Brother     Social History Social History  Substance Use Topics  . Smoking status: Never Smoker  . Smokeless tobacco: Never Used  . Alcohol use No     Allergies   Sulfa antibiotics   Review of Systems Review of Systems  ROS reviewed and all otherwise negative except for mentioned in HPI   Physical  Exam Updated Vital Signs BP (!) 155/95   Pulse 86   Temp 98.6 F (37 C) (Oral)   Resp 16   Ht 5\' 4"  (1.626 m)   Wt 86.2 kg   SpO2 95%   BMI 32.61 kg/m  Vitals reviewed Physical Exam Physical Examination: General appearance - alert, well appearing, and in no distress Mental status - alert, oriented to person, place, and time Eyes - no conjunctival injection, no scleral icterus Chest - clear to auscultation, no wheezes, rales or rhonchi, symmetric air entry Heart - normal rate, regular rhythm, normal S1, S2, no murmurs, rubs, clicks or gallops Abdomen - soft, nontender, nondistended, no masses or organomegaly Back exam - full range of motion, no midline tenderness, no CVA tenderness  palpable spasm, she does have pain on motion Neurological - alert, oriented, normal speech, strength 5/5 in extremities x 4, sensation intact Extremities - peripheral pulses normal, no pedal edema, no clubbing or cyanosis Skin - normal coloration and turgor, no rashes  ED Treatments / Results  Labs (all labs ordered are listed, but only abnormal results are displayed) Labs Reviewed  URINALYSIS, ROUTINE W REFLEX MICROSCOPIC - Abnormal; Notable for the following:       Result Value   APPearance HAZY (*)    Hgb urine dipstick SMALL (*)    Leukocytes, UA LARGE (*)    Bacteria, UA FEW (*)    Squamous Epithelial / LPF 0-5 (*)    Non Squamous Epithelial 0-5 (*)    All other components within normal limits  BASIC METABOLIC PANEL - Abnormal; Notable for the following:    Glucose, Bld 105 (*)    GFR calc non Af Amer 60 (*)    All other components within normal limits  URINE CULTURE  CBC WITH DIFFERENTIAL/PLATELET    EKG  EKG Interpretation None       Radiology Ct Renal Stone Study  Result Date: 01/28/2017 CLINICAL DATA:  Right flank pain radiating to the right lower quadrant since Friday. EXAM: CT ABDOMEN AND PELVIS WITHOUT CONTRAST TECHNIQUE: Multidetector CT imaging of the abdomen and pelvis  was performed following the standard protocol without IV contrast. COMPARISON:  05/31/2010 FINDINGS: Lower chest:  Small fatty posterior right diaphragmatic hernia. Hepatobiliary: No focal liver abnormality. Hepatic steatosis reported on previous sonography is not detected on this scan. No evidence of biliary obstruction or stone. Pancreas: Unremarkable. Spleen: Unremarkable. Adrenals/Urinary Tract: Negative adrenals. No hydronephrosis or stone. Unremarkable bladder. Stomach/Bowel:  No obstruction. No appendicitis. Vascular/Lymphatic: No acute vascular abnormality. No mass or adenopathy. Reproductive:No pathologic findings. Other: No ascites or pneumoperitoneum. Musculoskeletal: No acute or aggressive finding IMPRESSION: No explanation for abdominal pain.  Stable compared to 2011. Electronically Signed  By: Monte Fantasia M.D.   On: 01/28/2017 13:04    Procedures Procedures (including critical care time)  Medications Ordered in ED Medications  morphine 4 MG/ML injection 4 mg (4 mg Intravenous Given 01/28/17 1334)  ondansetron (ZOFRAN) injection 4 mg (4 mg Intravenous Given 01/28/17 1334)  sodium chloride 0.9 % bolus 1,000 mL (0 mLs Intravenous Stopped 01/28/17 1406)     Initial Impression / Assessment and Plan / ED Course  I have reviewed the triage vital signs and the nursing notes.  Pertinent labs & imaging results that were available during my care of the patient were reviewed by me and considered in my medical decision making (see chart for details).     Pt presenting with right sided low back pain.  She also describes urinary frequency.  Urine does have many wbcs, and some RBCs, back pain is not reproducible on exam- although may be muscular in nature due to discomfort in different positions.  However will r/o ureteral stone.  CT is negative for stone.  Will treat for UTI, urine culture sent,  Will also treat for muscular back pain.  Discussed importance of followup with PMD Discharged with  strict return precautions.  Pt agreeable with plan.  Final Clinical Impressions(s) / ED Diagnoses   Final diagnoses:  Acute cystitis without hematuria  Acute right-sided low back pain without sciatica    New Prescriptions Discharge Medication List as of 01/28/2017  1:34 PM    START taking these medications   Details  cephALEXin (KEFLEX) 500 MG capsule Take 1 capsule (500 mg total) by mouth 3 (three) times daily., Starting Tue 01/28/2017, Print    methocarbamol (ROBAXIN) 500 MG tablet Take 1 tablet (500 mg total) by mouth 2 (two) times daily., Starting Tue 01/28/2017, Print    naproxen (NAPROSYN) 500 MG tablet Take 1 tablet (500 mg total) by mouth 2 (two) times daily., Starting Tue 01/28/2017, Print    oxyCODONE-acetaminophen (PERCOCET/ROXICET) 5-325 MG tablet Take 1-2 tablets by mouth every 6 (six) hours as needed for severe pain., Starting Tue 01/28/2017, Print         Alfonzo Beers, MD 01/29/17 [phone]

## 2017-01-28 NOTE — Discharge Instructions (Signed)
Return to the ED with any concerns including fever/chills, vomiting and not able to keep down liquids, weakness of legs, not able to urinate, loss of control of bowel or bladder, decreased level of alertness/lethargy, or any other alarming symptoms

## 2017-01-28 NOTE — ED Triage Notes (Signed)
Pt c/o of right sided flank pain and urinary urgency x2 days.

## 2017-01-29 LAB — URINE CULTURE

## 2017-02-23 ENCOUNTER — Encounter: Payer: Self-pay | Admitting: Internal Medicine

## 2017-02-23 NOTE — Progress Notes (Signed)
This very nice 61 y.o. DWF presents for 3 month follow up with Hypertension, Hyperlipidemia, Pre-Diabetes/Insulin Resistance and Vitamin D Deficiency.  Patient also has hx/o Gout apparently controlled with meds. Patient is generally poorly compliant with diet & meds.     Patient is also on SS Disability for BiPolar Manic Depressive Disorder w/Psychosis. Other problems include treated Hepatitis C in Remission, altho she does have a Fatty liver.      Patient is followed expectantly with labile HTN (2005) & BP has been controlled at home. Today's BP is at goal - 122/80. Patient has had no complaints of any cardiac type chest pain, palpitations, dyspnea/orthopnea/PND, dizziness, claudication, or dependent edema.     Hyperlipidemia is not controlled with diet. Last Lipids were not at goal: Lab Results  Component Value Date   CHOL 213 (H) 11/22/2016   HDL 31 (L) 11/22/2016   LDLCALC 129 (H) 11/22/2016   TRIG 265 (H) 11/22/2016   CHOLHDL 6.9 (H) 11/22/2016      Also, the patient has history of Morbid Obesity (BMI 31+) and consequent PreDiabetes/Insulin Resistance with Nl A1c's and elevated insulin levels and has had no symptoms of reactive hypoglycemia, diabetic polys, paresthesias or visual blurring.  Last A1c 5.0% was at goal with elevated Insulin 39 in Feb 2018.     Patient has been taking Thyroid meds since 2002. Further, the patient also has history of Vitamin D Deficiency ("29" in 2015)  and supplements vitamin D without any suspected side-effects. Last vitamin D was still very low reflecting poor compliance: Lab Results  Component Value Date   VD25OH 27 (L) 11/22/2016   Current Outpatient Prescriptions on File Prior to Visit  Medication Sig  . levothyroxine (SYNTHROID, LEVOTHROID) 75 MCG tablet TAKE 1 TABLET BY MOUTH EVERY DAY  . MAGNESIUM PO Take by mouth.  Marland Kitchen OVER THE COUNTER MEDICATION   . thiothixene (NAVANE) 2 MG capsule TK 1 C PO TWICE DAILY. DO NOT DRIVE OR OPERATE WITH  MACHINERY. DO NOT U ALCOHOL  . VITAMIN D, CHOLECALCIFEROL, PO Take 5,000 Units by mouth 2 (two) times daily.    No current facility-administered medications on file prior to visit.    Allergies  Allergen Reactions  . Sulfa Antibiotics Rash   PMHx:   Past Medical History:  Diagnosis Date  . Anxiety   . Depression   . Gout 05/17/2016  . Hepatitis   . Hypothyroidism   . Labile hypertension   . Mental disorder   . Prediabetes   . Thyroid disease    Immunization History  Administered Date(s) Administered  . Pneumococcal-Unspecified 10/14/2001  . Td 10/15/1995   Past Surgical History:  Procedure Laterality Date  . arm fracture    . ROTATOR CUFF REPAIR     FHx:    Reviewed / unchanged  SHx:    Reviewed / unchanged  Systems Review:  Constitutional: Denies fever, chills, wt changes, headaches, insomnia, fatigue, night sweats, change in appetite. Eyes: Denies redness, blurred vision, diplopia, discharge, itchy, watery eyes.  ENT: Denies discharge, congestion, post nasal drip, epistaxis, sore throat, earache, hearing loss, dental pain, tinnitus, vertigo, sinus pain, snoring.  CV: Denies chest pain, palpitations, irregular heartbeat, syncope, dyspnea, diaphoresis, orthopnea, PND, claudication or edema. Respiratory: denies cough, dyspnea, DOE, pleurisy, hoarseness, laryngitis, wheezing.  Gastrointestinal: Denies dysphagia, odynophagia, heartburn, reflux, water brash, abdominal pain or cramps, nausea, vomiting, bloating, diarrhea, constipation, hematemesis, melena, hematochezia  or hemorrhoids. Genitourinary: Denies dysuria, frequency, urgency, nocturia, hesitancy, discharge, hematuria  or flank pain. Musculoskeletal: Denies arthralgias, myalgias, stiffness, jt. swelling, pain, limping or strain/sprain.  Skin: Denies pruritus, rash, hives, warts, acne, eczema or change in skin lesion(s). Neuro: No weakness, tremor, incoordination, spasms, paresthesia or pain. Psychiatric: Denies  confusion, memory loss or sensory loss. Endo: Denies change in weight, skin or hair change.  Heme/Lymph: No excessive bleeding, bruising or enlarged lymph nodes.  Physical Exam  BP 122/80   Pulse 76   Temp 97.9 F (36.6 C)   Resp 16   Ht 5' 4.25" (1.632 m)   Wt 194 lb (88 kg)   BMI 33.04 kg/m   Appears well nourished, well groomed  and in no distress.  Eyes: PERRLA, EOMs, conjunctiva no swelling or erythema. Sinuses: No frontal/maxillary tenderness ENT/Mouth: EAC's clear, TM's nl w/o erythema, bulging. Nares clear w/o erythema, swelling, exudates. Oropharynx clear without erythema or exudates. Oral hygiene is good. Tongue normal, non obstructing. Hearing intact.  Neck: Supple. Thyroid nl. Car 2+/2+ without bruits, nodes or JVD. Chest: Respirations nl with BS clear & equal w/o rales, rhonchi, wheezing or stridor.  Cor: Heart sounds normal w/ regular rate and rhythm without sig. murmurs, gallops, clicks or rubs. Peripheral pulses normal and equal  without edema.  Abdomen: Soft & bowel sounds normal. Non-tender w/o guarding, rebound, hernias, masses or organomegaly.  Lymphatics: Unremarkable.  Musculoskeletal: Full ROM all peripheral extremities, joint stability, 5/5 strength and normal gait.  Skin: Warm, dry without exposed rashes, lesions or ecchymosis apparent.  Neuro: Cranial nerves intact, reflexes equal bilaterally. Sensory-motor testing grossly intact. Tendon reflexes grossly intact.  Pysch: Alert & oriented x 3.  Insight and judgement nl & appropriate. No ideations.  Assessment and Plan:  1. Labile hypertension  - Continue medication, monitor blood pressure at home.  - Continue DASH diet. Reminder to go to the ER if any CP,  SOB, nausea, dizziness, severe HA, changes vision/speech,  left arm numbness and tingling and jaw pain.  - CBC with Differential/Platelet - BASIC METABOLIC PANEL WITH GFR - Magnesium - TSH  2. Hyperlipidemia, mixed  - Continue diet/meds,  exercise,& lifestyle modifications.  - Continue monitor periodic cholesterol/liver & renal functions   - Hepatic function panel - Lipid panel - TSH  3. Prediabetes  - Continue diet, exercise, lifestyle modifications.  - Monitor appropriate labs. - Hemoglobin A1c - Insulin, random  4. Vitamin D deficiency  - Continue supplementation.  - VITAMIN D 25 Hydroxy    5. Hypothyroidism, unspecified type  - TSH  6. Medication management  - CBC with Differential/Platelet - BASIC METABOLIC PANEL WITH GFR - Hepatic function panel - Magnesium - Lipid panel - TSH - Hemoglobin A1c - Insulin, random - VITAMIN D 25 Hydroxy        Discussed  regular exercise, BP monitoring, weight control to achieve/maintain BMI less than 25 and discussed med and SE's. Recommended labs to assess and monitor clinical status with further disposition pending results of labs. Over 30 minutes of exam, counseling, chart review was performed.

## 2017-02-23 NOTE — Patient Instructions (Signed)

## 2017-02-24 ENCOUNTER — Ambulatory Visit (INDEPENDENT_AMBULATORY_CARE_PROVIDER_SITE_OTHER): Payer: PPO | Admitting: Internal Medicine

## 2017-02-24 ENCOUNTER — Encounter: Payer: Self-pay | Admitting: Internal Medicine

## 2017-02-24 VITALS — BP 122/80 | HR 76 | Temp 97.9°F | Resp 16 | Ht 64.25 in | Wt 194.0 lb

## 2017-02-24 DIAGNOSIS — E559 Vitamin D deficiency, unspecified: Secondary | ICD-10-CM | POA: Diagnosis not present

## 2017-02-24 DIAGNOSIS — Z79899 Other long term (current) drug therapy: Secondary | ICD-10-CM | POA: Diagnosis not present

## 2017-02-24 DIAGNOSIS — E782 Mixed hyperlipidemia: Secondary | ICD-10-CM

## 2017-02-24 DIAGNOSIS — E039 Hypothyroidism, unspecified: Secondary | ICD-10-CM

## 2017-02-24 DIAGNOSIS — R7303 Prediabetes: Secondary | ICD-10-CM

## 2017-02-24 DIAGNOSIS — R0989 Other specified symptoms and signs involving the circulatory and respiratory systems: Secondary | ICD-10-CM | POA: Diagnosis not present

## 2017-02-24 LAB — CBC WITH DIFFERENTIAL/PLATELET
BASOS ABS: 51 {cells}/uL (ref 0–200)
Basophils Relative: 1 %
EOS ABS: 510 {cells}/uL — AB (ref 15–500)
EOS PCT: 10 %
HCT: 38.4 % (ref 35.0–45.0)
Hemoglobin: 12.6 g/dL (ref 11.7–15.5)
LYMPHS PCT: 34 %
Lymphs Abs: 1734 cells/uL (ref 850–3900)
MCH: 28.3 pg (ref 27.0–33.0)
MCHC: 32.8 g/dL (ref 32.0–36.0)
MCV: 86.3 fL (ref 80.0–100.0)
MONOS PCT: 8 %
MPV: 9.7 fL (ref 7.5–12.5)
Monocytes Absolute: 408 cells/uL (ref 200–950)
Neutro Abs: 2397 cells/uL (ref 1500–7800)
Neutrophils Relative %: 47 %
PLATELETS: 206 10*3/uL (ref 140–400)
RBC: 4.45 MIL/uL (ref 3.80–5.10)
RDW: 14.1 % (ref 11.0–15.0)
WBC: 5.1 10*3/uL (ref 3.8–10.8)

## 2017-02-24 LAB — TSH: TSH: 1.52 m[IU]/L

## 2017-02-25 LAB — LIPID PANEL
CHOL/HDL RATIO: 6.8 ratio — AB (ref <5.0)
Cholesterol: 176 mg/dL (ref <200)
HDL: 26 mg/dL — AB (ref >50)
LDL Cholesterol: 112 mg/dL — ABNORMAL HIGH (ref <100)
Triglycerides: 189 mg/dL — ABNORMAL HIGH (ref <150)
VLDL: 38 mg/dL — ABNORMAL HIGH (ref <30)

## 2017-02-25 LAB — HEPATIC FUNCTION PANEL
ALK PHOS: 86 U/L (ref 33–130)
ALT: 15 U/L (ref 6–29)
AST: 20 U/L (ref 10–35)
Albumin: 4 g/dL (ref 3.6–5.1)
BILIRUBIN DIRECT: 0.1 mg/dL (ref <=0.2)
BILIRUBIN INDIRECT: 0.3 mg/dL (ref 0.2–1.2)
BILIRUBIN TOTAL: 0.4 mg/dL (ref 0.2–1.2)
Total Protein: 6.9 g/dL (ref 6.1–8.1)

## 2017-02-25 LAB — INSULIN, RANDOM: Insulin: 69.5 u[IU]/mL — ABNORMAL HIGH (ref 2.0–19.6)

## 2017-02-25 LAB — BASIC METABOLIC PANEL WITH GFR
BUN: 8 mg/dL (ref 7–25)
CO2: 23 mmol/L (ref 20–31)
CREATININE: 0.95 mg/dL (ref 0.50–0.99)
Calcium: 9.2 mg/dL (ref 8.6–10.4)
Chloride: 105 mmol/L (ref 98–110)
GFR, EST AFRICAN AMERICAN: 75 mL/min (ref >=60)
GFR, Est Non African American: 65 mL/min (ref >=60)
Glucose, Bld: 132 mg/dL — ABNORMAL HIGH (ref 65–99)
Potassium: 3.8 mmol/L (ref 3.5–5.3)
SODIUM: 139 mmol/L (ref 135–146)

## 2017-02-25 LAB — HEMOGLOBIN A1C
Hgb A1c MFr Bld: 5 % (ref <5.7)
Mean Plasma Glucose: 97 mg/dL

## 2017-02-25 LAB — VITAMIN D 25 HYDROXY (VIT D DEFICIENCY, FRACTURES): Vit D, 25-Hydroxy: 64 ng/mL (ref 30–100)

## 2017-02-25 LAB — MAGNESIUM: MAGNESIUM: 1.7 mg/dL (ref 1.5–2.5)

## 2017-04-03 ENCOUNTER — Other Ambulatory Visit: Payer: Self-pay | Admitting: Internal Medicine

## 2017-04-03 ENCOUNTER — Other Ambulatory Visit: Payer: PPO

## 2017-04-03 DIAGNOSIS — E782 Mixed hyperlipidemia: Secondary | ICD-10-CM | POA: Diagnosis not present

## 2017-04-04 LAB — LIPID PANEL
CHOLESTEROL: 213 mg/dL — AB (ref <200)
HDL: 39 mg/dL — AB (ref >50)
LDL Cholesterol: 147 mg/dL — ABNORMAL HIGH (ref <100)
Total CHOL/HDL Ratio: 5.5 Ratio — ABNORMAL HIGH (ref <5.0)
Triglycerides: 134 mg/dL (ref <150)
VLDL: 27 mg/dL (ref <30)

## 2017-04-04 NOTE — Progress Notes (Signed)
LVM for pt to return office call for LAB results.

## 2017-04-09 NOTE — Progress Notes (Signed)
Pt aware of lab results & voiced understanding of those results.

## 2017-05-27 NOTE — Progress Notes (Signed)
Patient ID: Rebekah Pace, female   DOB: 1956/09/05, 61 y.o.   MRN: [id number]  Medicare  Annual  Wellness Visit And follow up  Assessment:    Labile hypertension - continue medications, DASH diet, exercise and monitor at home. Call if greater than 130/80.  -     CBC with Differential/Platelet -     BASIC METABOLIC PANEL WITH GFR -     Hepatic function panel -     TSH  Chronic hepatitis C without hepatic coma (HCC) -     Hepatic function panel  Hepatic fibrosis -     Hepatic function panel - Check labs, avoid tylenol, alcohol, weight loss advised.   Hypothyroidism, unspecified type -     TSH Hypothyroidism-check TSH level, continue medications the same, reminded to take on an empty stomach 30-37mins before food.   Schizoaffective disorder, bipolar type ([place]) Continue follow up psych  Recurrent major depressive disorder, in full remission ([place]) Continue follow up psych No SI/HI  Medication management -     Magnesium  Mixed hyperlipidemia -continue medications, check lipids, decrease fatty foods, increase activity.  -     Lipid panel  Prediabetes Discussed general issues about diabetes pathophysiology and management., Educational material distributed., Suggested low cholesterol diet., Encouraged aerobic exercise., Discussed foot care., Reminded to get yearly retinal exam.  Vitamin D deficiency  Morbid obesity ([place])  (BMI 31.84) - long discussion about weight loss, diet, and exercise  Bipolar affective disorder, depressed, severe, with psychotic behavior ([place]) Continue follow up psych  BMI 31.84,adult - long discussion about weight loss, diet, and exercise  Chronic gout without tophus, unspecified cause, unspecified site Gout- recheck Uric acid as needed, Diet discussed, continue medications.  Encounter for Medicare annual wellness exam Due 1 year Colonoscopy- patient declines a colonoscopy even though the risks and benefits were discussed at length. Colon cancer  is 3rd most diagnosed cancer and 2nd leading cause of death in both men and women 36 years of age and older. Patient understands the risk of cancer and death with declining the test however they are willing to do cologuard screening instead. They understand that this is not as sensitive or specific as a colonoscopy and they are still recommended to get a colonoscopy. The cologuard will be sent out to their house.   Advanced care planning/counseling discussion Fill out papers and bring in next OV  Cystocele with uterine prolapse -     Ambulatory referral to Physical Therapy   Future Appointments Date Time Provider [place]  09/30/2017 2:00 PM Unk Pinto, MD [facility] None    Plan:   During the course of the visit the patient was educated and counseled about appropriate screening and preventive services including:    Pneumococcal vaccine   Influenza vaccine  Td vaccine  Screening electrocardiogram  Bone densitometry screening  Colorectal cancer screening  Diabetes screening  Glaucoma screening  Nutrition counseling   Advanced directives: requested   Subjective:   Rebekah Pace, 61 y.o. DWF,  presents for Medicare Annual Wellness Visit and follow up  for follow up with Hypertension, Hyperlipidemia, Pre-Diabetes, Hypothyroidism  and Vitamin D Deficiency.   She has been following with Dr. Zollie [facility] for [facility] C, treated with epclusa.   In the spring of 2013 she had a psychotic break, has had several psych admission, Dx'd Bipolar Manic Depressive. Currently she is followed by [facility] by a staff therapist & Psychiatrist to  manage her psychotropic medications. She is on Zoloft and on Abilify x 4-6 weeks.   Patient has hx/o labile HTN circa 2005 and BP has been controlled and today's BP: 116/62. Patient has had no complaints of any cardiac type chest pain, palpitations, dyspnea/orthopnea/PND, dizziness,  claudication, or dependent edema.  Hyperlipidemia is controlled with diet & meds. Patient denies myalgias or other med SE's. Last Lipids were at goal with  Lab Results  Component Value Date   CHOL 213 (H) 04/03/2017   HDL 39 (L) 04/03/2017   LDLCALC 147 (H) 04/03/2017   TRIG 134 04/03/2017   CHOLHDL 5.5 (H) 04/03/2017   Also, the patient has history of PreDiabetes with Insulin Resistance with A1c 5.5% and elevated Insulin level 35 in Jan 2014 and has had no symptoms of reactive hypoglycemia, diabetic polys, paresthesias or visual blurring.   Lab Results  Component Value Date   HGBA1C 5.0 02/24/2017   Patient has been on thyroid replacement circa 2005 and remained in therapeutic range on routine monitoring.  Lab Results  Component Value Date   TSH 1.52 02/24/2017   Further, the patient also has history of Vitamin D Deficiency of 29 in June 2015 and she is not currently on Vit d supplements. Lab Results  Component Value Date   VD25OH 64 02/24/2017   BMI is Body mass index is 33.11 kg/m., she is working on diet and exercise. Wt Readings from Last 3 Encounters:  05/28/17 194 lb 6.4 oz (88.2 kg)  02/24/17 194 lb (88 kg)  01/28/17 190 lb (86.2 kg)   She is on thyroid medication. Her medication was not changed last visit.   Lab Results  Component Value Date   TSH 1.52 02/24/2017  .   Medication Review: Current Outpatient Prescriptions on File Prior to Visit  Medication Sig  . levothyroxine (SYNTHROID, LEVOTHROID) 75 MCG tablet TAKE 1 TABLET BY MOUTH EVERY DAY  . MAGNESIUM PO Take by mouth.  Marland Kitchen OVER THE COUNTER MEDICATION   . sertraline (ZOLOFT) 50 MG tablet   . VITAMIN D, CHOLECALCIFEROL, PO Take 5,000 Units by mouth 2 (two) times daily.    No current facility-administered medications on file prior to visit.     Current Problems (verified) Patient Active Problem List   Diagnosis Date Noted  . Hepatic fibrosis 11/23/2016  . Gout 05/17/2016  . HCV (hepatitis C virus)  01/31/2016  . Diarrhea 01/31/2016  . BMI 31.84,adult 07/24/2015  . Morbid obesity ([place])  (BMI 31.84) 07/24/2015  . Hypothyroidism 12/13/2014  . Mixed hyperlipidemia 09/01/2014  . Vitamin D deficiency 09/01/2014  . Bipolar affective disorder, depressed, severe, with psychotic behavior ([place]) 09/01/2014  . Medication management 03/30/2014  . Major depression in full remission ([place])   . Prediabetes   . Labile hypertension   . Schizoaffective disorder, bipolar type ([place]) 09/12/2011    Screening Tests Immunization History  Administered Date(s) Administered  . Pneumococcal-Unspecified 10/14/2001  . Td 10/15/1995    Preventative care: Last colonoscopy: 03/2001 by Dr Deatra Ina declines another colonoscopy MGM 02/2016 [place] Ab 09/2015 CXR 2013 PAP 2016  Declines vaccines  Names of Other Physician/Practitioners you currently use: 1. [facility] here for primary care 2. Dr Sherlean Foot, OD, eye doctor, last visit 2014  3. Dr Clarene Essex, dentist, last visit 2016  Patient Care Team: Unk Pinto, MD as PCP - General (Internal Medicine) Garrel Ridgel, DPM as Consulting Physician (Podiatry) Inda Castle, MD as Consulting Physician (Gastroenterology) Thalia Bloodgood, OD as  Referring Physician (Optometry)  Allergies Allergies  Allergen Reactions  . Sulfa Antibiotics Rash    SURGICAL HISTORY She  has a past surgical history that includes arm fracture and Rotator cuff repair. FAMILY HISTORY Her family history includes Cancer in her father; Colon polyps in her mother; Hyperlipidemia in her brother and mother; Hypertension in her mother. SOCIAL HISTORY She  reports that she has never smoked. She has never used smokeless tobacco. She reports that she does not drink alcohol or use drugs.  MEDICARE WELLNESS OBJECTIVES: Physical activity:   Cardiac risk factors:   Depression/mood screen:   Depression screen [facility] 2/9 02/24/2017  Decreased Interest 0   Down, Depressed, Hopeless 0  PHQ - 2 Score 0    ADLs:  In your present state of health, do you have any difficulty performing the following activities: 02/24/2017 02/23/2017  Hearing? N N  Vision? N N  Difficulty concentrating or making decisions? N N  Walking or climbing stairs? N N  Dressing or bathing? N N  Doing errands, shopping? N N  Some recent data might be hidden     Cognitive Testing  Alert? Yes  Normal Appearance?Yes  Oriented to person? Yes  Place? Yes   Time? Yes  Recall of three objects?  Yes  Can perform simple calculations? Yes  Displays appropriate judgment?Yes  Can read the correct time from a watch face?Yes  EOL planning: Does Patient Have a Medical Advance Directive?: No Would patient like information on creating a medical advance directive?: Yes (MAU/Ambulatory/Procedural Areas - Information given)   Objective:     BP 116/62   Pulse 74   Temp 97.6 F (36.4 C)   Resp 14   Ht 5' 4.25" (1.632 m)   Wt 194 lb 6.4 oz (88.2 kg)   SpO2 98%   BMI 33.11 kg/m   General Appearance: Well nourished, alert, WD/WN, female and in no apparent distress. Eyes: PERRLA, EOMs, conjunctiva no swelling or erythema, normal fundi and vessels. Sinuses: No frontal/maxillary tenderness ENT/Mouth: EACs patent / TMs  nl. Nares clear without erythema, swelling, mucoid exudates. Oral hygiene is good. No erythema, swelling, or exudate. Tongue normal, non-obstructing. Tonsils not swollen or erythematous. Hearing normal.  Neck: Supple, thyroid normal. No bruits, nodes or JVD. Respiratory: Respiratory effort normal.  BS equal and clear bilateral without rales, rhonci, wheezing or stridor. Cardio: Heart sounds are normal with regular rate and rhythm and no murmurs, rubs or gallops. Peripheral pulses are normal and equal bilaterally without edema. No aortic or femoral bruits. Chest: symmetric with normal excursions and percussion. Breasts: declines  Abdomen: Flat, soft  with nl bowel  sounds. Diffuse tenderness, no guarding, rebound, hernias, masses, or organomegaly.  No CVA tendernss Lymphatics: Non tender without lymphadenopathy.  Genitourinary:  Musculoskeletal: Full ROM all peripheral extremities, joint stability, 5/5 strength, and normal gait. Skin: Warm and dry without rashes, lesions, cyanosis, clubbing or  ecchymosis.  Neuro: Cranial nerves intact, reflexes equal bilaterally. Normal muscle tone, no cerebellar symptoms. Sensation intact.  Pysch: Alert and oriented X 3, normal affect, Insight and Judgment appropriate.   Medicare Attestation I have personally reviewed: The patient's medical and social history Their use of alcohol, tobacco or illicit drugs Their current medications and supplements The patient's functional ability including ADLs,fall risks, home safety risks, cognitive, and hearing and visual impairment Diet and physical activities Evidence for depression or mood disorders  The patient's weight, height, BMI, and visual acuity have been recorded in the chart.  I have made  referrals, counseling, and provided education to the patient based on review of the above and I have provided the patient with a written personalized care plan for preventive services.  Over 40 minutes of exam, counseling, chart review was performed.  Vicie Mutters, PA-C   05/28/2017

## 2017-05-28 ENCOUNTER — Encounter: Payer: Self-pay | Admitting: Physician Assistant

## 2017-05-28 ENCOUNTER — Ambulatory Visit (INDEPENDENT_AMBULATORY_CARE_PROVIDER_SITE_OTHER): Payer: PPO | Admitting: Physician Assistant

## 2017-05-28 VITALS — BP 116/62 | HR 74 | Temp 97.6°F | Resp 14 | Ht 64.25 in | Wt 194.4 lb

## 2017-05-28 DIAGNOSIS — R0989 Other specified symptoms and signs involving the circulatory and respiratory systems: Secondary | ICD-10-CM | POA: Diagnosis not present

## 2017-05-28 DIAGNOSIS — F250 Schizoaffective disorder, bipolar type: Secondary | ICD-10-CM

## 2017-05-28 DIAGNOSIS — Z7189 Other specified counseling: Secondary | ICD-10-CM

## 2017-05-28 DIAGNOSIS — K7400 Hepatic fibrosis, unspecified: Secondary | ICD-10-CM

## 2017-05-28 DIAGNOSIS — F315 Bipolar disorder, current episode depressed, severe, with psychotic features: Secondary | ICD-10-CM

## 2017-05-28 DIAGNOSIS — E782 Mixed hyperlipidemia: Secondary | ICD-10-CM

## 2017-05-28 DIAGNOSIS — M1A9XX0 Chronic gout, unspecified, without tophus (tophi): Secondary | ICD-10-CM

## 2017-05-28 DIAGNOSIS — R6889 Other general symptoms and signs: Secondary | ICD-10-CM | POA: Diagnosis not present

## 2017-05-28 DIAGNOSIS — E559 Vitamin D deficiency, unspecified: Secondary | ICD-10-CM

## 2017-05-28 DIAGNOSIS — E039 Hypothyroidism, unspecified: Secondary | ICD-10-CM

## 2017-05-28 DIAGNOSIS — F3342 Major depressive disorder, recurrent, in full remission: Secondary | ICD-10-CM

## 2017-05-28 DIAGNOSIS — Z0001 Encounter for general adult medical examination with abnormal findings: Secondary | ICD-10-CM | POA: Diagnosis not present

## 2017-05-28 DIAGNOSIS — Z79899 Other long term (current) drug therapy: Secondary | ICD-10-CM

## 2017-05-28 DIAGNOSIS — R7303 Prediabetes: Secondary | ICD-10-CM

## 2017-05-28 DIAGNOSIS — Z6831 Body mass index (BMI) 31.0-31.9, adult: Secondary | ICD-10-CM

## 2017-05-28 DIAGNOSIS — B182 Chronic viral hepatitis C: Secondary | ICD-10-CM | POA: Diagnosis not present

## 2017-05-28 DIAGNOSIS — N814 Uterovaginal prolapse, unspecified: Secondary | ICD-10-CM

## 2017-05-28 DIAGNOSIS — Z0000 Encounter for general adult medical examination without abnormal findings: Secondary | ICD-10-CM

## 2017-05-28 LAB — HEPATIC FUNCTION PANEL
AG Ratio: 1.5 (calc) (ref 1.0–2.5)
ALKALINE PHOSPHATASE (APISO): 120 U/L (ref 33–130)
ALT: 19 U/L (ref 6–29)
AST: 21 U/L (ref 10–35)
Albumin: 4.4 g/dL (ref 3.6–5.1)
BILIRUBIN DIRECT: 0.1 mg/dL (ref 0.0–0.2)
BILIRUBIN INDIRECT: 0.4 mg/dL (ref 0.2–1.2)
Globulin: 3 g/dL (calc) (ref 1.9–3.7)
TOTAL PROTEIN: 7.4 g/dL (ref 6.1–8.1)
Total Bilirubin: 0.5 mg/dL (ref 0.2–1.2)

## 2017-05-28 LAB — BASIC METABOLIC PANEL WITH GFR
BUN: 16 mg/dL (ref 7–25)
CALCIUM: 9.8 mg/dL (ref 8.6–10.4)
CHLORIDE: 101 mmol/L (ref 98–110)
CO2: 23 mmol/L (ref 20–32)
Creat: 0.92 mg/dL (ref 0.50–0.99)
GFR, Est African American: 77 mL/min/{1.73_m2} (ref >=60)
GFR, Est Non African American: 67 mL/min/{1.73_m2} (ref >=60)
GLUCOSE: 86 mg/dL (ref 65–99)
Potassium: 4.3 mmol/L (ref 3.5–5.3)
Sodium: 135 mmol/L (ref 135–146)

## 2017-05-28 LAB — CBC WITH DIFFERENTIAL/PLATELET
Basophils Absolute: 102 cells/uL (ref 0–200)
Basophils Relative: 1.6 %
EOS ABS: 320 {cells}/uL (ref 15–500)
Eosinophils Relative: 5 %
HCT: 42.4 % (ref 35.0–45.0)
Hemoglobin: 14.1 g/dL (ref 11.7–15.5)
Lymphs Abs: 1862 cells/uL (ref 850–3900)
MCH: 27.5 pg (ref 27.0–33.0)
MCHC: 33.3 g/dL (ref 32.0–36.0)
MCV: 82.8 fL (ref 80.0–100.0)
MONOS PCT: 9.3 %
MPV: 10 fL (ref 7.5–12.5)
Neutro Abs: 3520 cells/uL (ref 1500–7800)
Neutrophils Relative %: 55 %
PLATELETS: 266 10*3/uL (ref 140–400)
RBC: 5.12 10*6/uL — AB (ref 3.80–5.10)
RDW: 12.8 % (ref 11.0–15.0)
TOTAL LYMPHOCYTE: 29.1 %
WBC: 6.4 10*3/uL (ref 3.8–10.8)
WBCMIX: 595 {cells}/uL (ref 200–950)

## 2017-05-28 LAB — TSH: TSH: 2.08 m[IU]/L (ref 0.40–4.50)

## 2017-05-28 LAB — LIPID PANEL
Cholesterol: 208 mg/dL — ABNORMAL HIGH (ref <200)
HDL: 40 mg/dL — ABNORMAL LOW (ref >50)
LDL Cholesterol (Calc): 139 mg/dL (calc) — ABNORMAL HIGH
Non-HDL Cholesterol (Calc): 168 mg/dL (calc) — ABNORMAL HIGH (ref <130)
Total CHOL/HDL Ratio: 5.2 (calc) — ABNORMAL HIGH (ref <5.0)
Triglycerides: 154 mg/dL — ABNORMAL HIGH (ref <150)

## 2017-05-28 LAB — MAGNESIUM: MAGNESIUM: 2.2 mg/dL (ref 1.5–2.5)

## 2017-05-28 NOTE — Patient Instructions (Addendum)
Cologuard is an easy to use noninvasive colon cancer screening test based on the latest advances in stool DNA science.   Colon cancer is 3rd most diagnosed cancer and 2nd leading cause of death in both men and women 62 years of age and older despite being one of the most preventable and treatable cancers if found early.  4 of out 5 people diagnosed with colon cancer have NO prior family history.  When caught EARLY 90% of colon cancer is curable.   You have agreed to do a Cologuard screening and have declined a colonoscopy in spite of being explained the risks and benefits of the colonoscopy in detail, including cancer and death. Please understand that this is test not as sensitive or specific as a colonoscopy and you are still recommended to get a colonoscopy.   You will receive a short call from [place] support center at [employer], when you receive a call they will say they are from [name],  to confirm your mailing address and give you more information.  When they calll you, it will appear on the caller ID as "[employer]" or in some cases only this number will appear, [phone].   [employer] will ship your collection kit directly to you. You will collect a single stool sample in the privacy of your own home, no special preparation required. You will return the kit via [place] pre-paid shipping or pick-up, in the same box it arrived in. Then I will contact you to discuss your results after I receive them from the laboratory.   If you have any questions or concerns, Cologuard Customer Support Specialist are available 24 hours a day, 7 days a week at [phone] or go to [email or web address].    About Cystocele  Overview  The pelvic organs, including the bladder, are normally supported by pelvic floor muscles and ligaments.  When these muscles and ligaments are stretched, weakened or torn, the wall between the bladder and the vagina sags or  herniates causing a prolapse, sometimes called a cystocele.  This condition may cause discomfort and problems with emptying the bladder.  It can be present in various stages.  Some people are not aware of the changes.  Others may notice changes at the vaginal opening or a feeling of the bladder dropping outside the body.  Causes of a Cystocele  A cystocele is usually caused by muscle straining or stretching during childbirth.  In addition, cystocele is more common after menopause, because the hormone estrogen helps keep the elastic tissues around the pelvic organs strong.  A cystocele is more likely to occur when levels of estrogen decrease.  Other causes include: heavy lifting, chronic coughing, previous pelvic surgery and obesity.  Symptoms  A bladder that has dropped from its normal position may cause: unwanted urine leakage (stress incontinence), frequent urination or urge to urinate, incomplete emptying of the bladder (not feeling bladder relief after emptying), pain or discomfort in the vagina, pelvis, groin, lower back or lower abdomen and frequent urinary tract infections.  Mild cases may not cause any symptoms.  Treatment Options  Pelvic floor (Kegel) exercises:  Strength training the muscles in your genital area  Behavioral changes: Treating and preventing constipation, taking time to empty your bladder properly, learning to lift properly and/or avoid heavy lifting when possible, stopping smoking, avoiding weight gain and treating a chronic cough or bronchitis.  A pessary: A vaginal support device is sometimes used to help pelvic support caused by muscle  and ligament changes.  Surgery: Surgical repair may be necessary if symptoms cannot be managed with exercise, behavioral changes and a pessary.  Surgery is usually considered for severe cases.   2007, [employer]

## 2017-05-29 NOTE — Progress Notes (Signed)
LVM for pt to return office call for LAB results.

## 2017-05-29 NOTE — Progress Notes (Signed)
Pt aware of lab results & voiced understanding of those results.

## 2017-06-11 ENCOUNTER — Other Ambulatory Visit: Payer: Self-pay | Admitting: Internal Medicine

## 2017-06-23 DIAGNOSIS — Z1212 Encounter for screening for malignant neoplasm of rectum: Secondary | ICD-10-CM | POA: Diagnosis not present

## 2017-06-23 DIAGNOSIS — Z1211 Encounter for screening for malignant neoplasm of colon: Secondary | ICD-10-CM | POA: Diagnosis not present

## 2017-06-23 LAB — COLOGUARD: Cologuard: NEGATIVE

## 2017-06-25 DIAGNOSIS — N814 Uterovaginal prolapse, unspecified: Secondary | ICD-10-CM | POA: Diagnosis not present

## 2017-06-25 DIAGNOSIS — N3946 Mixed incontinence: Secondary | ICD-10-CM | POA: Diagnosis not present

## 2017-06-25 DIAGNOSIS — N8111 Cystocele, midline: Secondary | ICD-10-CM | POA: Diagnosis not present

## 2017-06-25 DIAGNOSIS — R102 Pelvic and perineal pain: Secondary | ICD-10-CM | POA: Diagnosis not present

## 2017-06-25 DIAGNOSIS — M62838 Other muscle spasm: Secondary | ICD-10-CM | POA: Diagnosis not present

## 2017-06-25 DIAGNOSIS — M6281 Muscle weakness (generalized): Secondary | ICD-10-CM | POA: Diagnosis not present

## 2017-08-05 ENCOUNTER — Other Ambulatory Visit: Payer: Self-pay | Admitting: Nurse Practitioner

## 2017-08-05 DIAGNOSIS — K7460 Unspecified cirrhosis of liver: Secondary | ICD-10-CM

## 2017-08-06 ENCOUNTER — Ambulatory Visit
Admission: RE | Admit: 2017-08-06 | Discharge: 2017-08-06 | Disposition: A | Discharge status: Home / Self Care | Payer: PPO | Source: Ambulatory Visit | Attending: Nurse Practitioner | Admitting: Nurse Practitioner

## 2017-08-06 DIAGNOSIS — K7460 Unspecified cirrhosis of liver: Secondary | ICD-10-CM | POA: Diagnosis not present

## 2017-08-06 DIAGNOSIS — K760 Fatty (change of) liver, not elsewhere classified: Secondary | ICD-10-CM | POA: Diagnosis not present

## 2017-08-06 DIAGNOSIS — K740 Hepatic fibrosis: Secondary | ICD-10-CM | POA: Diagnosis not present

## 2017-08-07 ENCOUNTER — Other Ambulatory Visit: Payer: Self-pay | Admitting: Internal Medicine

## 2017-08-07 ENCOUNTER — Encounter: Payer: Self-pay | Admitting: Physician Assistant

## 2017-08-07 ENCOUNTER — Other Ambulatory Visit: Payer: Self-pay | Admitting: Nurse Practitioner

## 2017-08-07 DIAGNOSIS — K7469 Other cirrhosis of liver: Secondary | ICD-10-CM

## 2017-09-30 ENCOUNTER — Encounter: Payer: Self-pay | Admitting: Internal Medicine

## 2017-09-30 ENCOUNTER — Ambulatory Visit: Payer: PPO | Admitting: Internal Medicine

## 2017-09-30 VITALS — BP 120/76 | HR 80 | Temp 97.4°F | Resp 16 | Ht 64.25 in | Wt 185.0 lb

## 2017-09-30 DIAGNOSIS — Z0001 Encounter for general adult medical examination with abnormal findings: Secondary | ICD-10-CM

## 2017-09-30 DIAGNOSIS — R0989 Other specified symptoms and signs involving the circulatory and respiratory systems: Secondary | ICD-10-CM | POA: Diagnosis not present

## 2017-09-30 DIAGNOSIS — F250 Schizoaffective disorder, bipolar type: Secondary | ICD-10-CM

## 2017-09-30 DIAGNOSIS — Z0000 Encounter for general adult medical examination without abnormal findings: Secondary | ICD-10-CM

## 2017-09-30 DIAGNOSIS — R7303 Prediabetes: Secondary | ICD-10-CM | POA: Diagnosis not present

## 2017-09-30 DIAGNOSIS — I10 Essential (primary) hypertension: Secondary | ICD-10-CM | POA: Diagnosis not present

## 2017-09-30 DIAGNOSIS — Z136 Encounter for screening for cardiovascular disorders: Secondary | ICD-10-CM | POA: Diagnosis not present

## 2017-09-30 DIAGNOSIS — Z1212 Encounter for screening for malignant neoplasm of rectum: Secondary | ICD-10-CM

## 2017-09-30 DIAGNOSIS — Z1211 Encounter for screening for malignant neoplasm of colon: Secondary | ICD-10-CM

## 2017-09-30 DIAGNOSIS — M1A9XX0 Chronic gout, unspecified, without tophus (tophi): Secondary | ICD-10-CM

## 2017-09-30 DIAGNOSIS — E039 Hypothyroidism, unspecified: Secondary | ICD-10-CM | POA: Diagnosis not present

## 2017-09-30 DIAGNOSIS — R7309 Other abnormal glucose: Secondary | ICD-10-CM

## 2017-09-30 DIAGNOSIS — Z79899 Other long term (current) drug therapy: Secondary | ICD-10-CM

## 2017-09-30 DIAGNOSIS — E782 Mixed hyperlipidemia: Secondary | ICD-10-CM

## 2017-09-30 DIAGNOSIS — E559 Vitamin D deficiency, unspecified: Secondary | ICD-10-CM

## 2017-09-30 NOTE — Progress Notes (Signed)
[facility] Rebekah Pace, M.D.     Uvaldo Bristle. Silverio Lay, P.A.-C Liane Comber, [place] [address] [place] [address] Telephone [phone] Telefax [phone] Annual Screening/Preventative Visit & Comprehensive Evaluation &  Examination           This very nice 61 y.o. DWF presents for a Screening/Preventative Visit & comprehensive evaluation and management of multiple medical co-morbidities.  Patient has been followed for HTN, Prediabetes/Ins Resistance, Hyperlipidemia, Hypothyroidism and Vitamin D Deficiency. Patient's Gout is apparently controlled on her Allopurinol. Patient is also on SS Disability for BiPolar Manic Depressive Disorder w/Psychosis. Other problems include hx/o Hep C s/p Epclusa x 90 days treatment to apparent cure w/ F4 fibrosis and hepatic steatosis by U/S. She does endorse poor medication compliance.         Labile HTN monitored expectantly predates since 2005. Patient's BP has been controlled at home and patient denies any cardiac symptoms as chest pain, palpitations, shortness of breath, dizziness or ankle swelling. Today's BP is at goal -  120/76.      Patient's hyperlipidemia is controlled with diet and medications. Patient denies myalgias or other medication SE's. Last lipids were not at goal: Lab Results  Component Value Date   CHOL 208 (H) 05/28/2017   HDL 40 (L) 05/28/2017   LDLCALC 147 (H) 04/03/2017   TRIG 154 (H) 05/28/2017   CHOLHDL 5.2 (H) 05/28/2017      Patient has Morbid Obesity (BMI 31+ ) and is monitored expectantly for prediabetes/ Insulin resistance and patient denies reactive hypoglycemic symptoms, visual blurring, diabetic polys, or paresthesias. Last A1c was at goal; Lab Results  Component Value Date   HGBA1C 5.0 02/24/2017      Patient has been on Thyroid Replacement since 2002.Finally, patient has history of Vitamin D Deficiency ("29"/2015) and last  Vitamin D was at goal: Lab Results  Component Value Date   VD25OH 64 02/24/2017   Current Outpatient Medications on File Prior to Visit  Medication Sig  . ARIPiprazole (ABILIFY) 10 MG tablet Take 10 mg by mouth daily.  Marland Kitchen levothyroxine (SYNTHROID, LEVOTHROID) 75 MCG tablet TAKE 1 TABLET BY MOUTH EVERY DAY  . MAGNESIUM PO Take by mouth.  Marland Kitchen OVER THE COUNTER MEDICATION   . VITAMIN D, CHOLECALCIFEROL, PO Take 5,000 Units by mouth 2 (two) times daily.    No current facility-administered medications on file prior to visit.    Allergies  Allergen Reactions  . Sulfa Antibiotics Rash   Past Medical History:  Diagnosis Date  . Anxiety   . Depression   . Gout 05/17/2016  . Hepatitis   . Hypothyroidism   . Labile hypertension   . Mental disorder   . Prediabetes   . Thyroid disease    Health Maintenance  Topic Date Due  . TETANUS/TDAP  10/14/2005  . INFLUENZA VACCINE  05/14/2017  . MAMMOGRAM  02/12/2018  . PAP SMEAR  07/18/2018  . Fecal DNA (Cologuard)  06/24/2020  . Hepatitis C Screening  Completed  . HIV Screening  Completed   Immunization History  Administered Date(s) Administered  . Pneumococcal-Unspecified 10/14/2001  . Td 10/15/1995   Last Colon -  Last Pap -  Past Surgical History:  Procedure Laterality Date  . arm fracture    . ROTATOR CUFF REPAIR     Family History  Problem Relation Age of Onset  . Hyperlipidemia Mother   . Hypertension Mother   . Colon polyps Mother   .  Cancer Father        Lung  . Hyperlipidemia Brother    Social History   Tobacco Use  . Smoking status: Never Smoker  . Smokeless tobacco: Never Used  Substance Use Topics  . Alcohol use: No  . Drug use: No    ROS Constitutional: Denies fever, chills, weight loss/gain, headaches, insomnia,  night sweats, and change in appetite. Does c/o fatigue. Eyes: Denies redness, blurred vision, diplopia, discharge, itchy, watery eyes.  ENT: Denies discharge, congestion, post nasal drip,  epistaxis, sore throat, earache, hearing loss, dental pain, Tinnitus, Vertigo, Sinus pain, snoring.  Cardio: Denies chest pain, palpitations, irregular heartbeat, syncope, dyspnea, diaphoresis, orthopnea, PND, claudication, edema Respiratory: denies cough, dyspnea, DOE, pleurisy, hoarseness, laryngitis, wheezing.  Gastrointestinal: Denies dysphagia, heartburn, reflux, water brash, pain, cramps, nausea, vomiting, bloating, diarrhea, constipation, hematemesis, melena, hematochezia, jaundice, hemorrhoids Genitourinary: Denies dysuria, frequency, urgency, nocturia, hesitancy, discharge, hematuria, flank pain Breast: Breast lumps, nipple discharge, bleeding.  Musculoskeletal: Denies arthralgia, myalgia, stiffness, Jt. Swelling, pain, limp, and strain/sprain. Denies falls. Skin: Denies puritis, rash, hives, warts, acne, eczema, changing in skin lesion Neuro: No weakness, tremor, incoordination, spasms, paresthesia, pain Psychiatric: Denies confusion, memory loss, sensory loss. Denies Depression. Endocrine: Denies change in weight, skin, hair change, nocturia, and paresthesia, diabetic polys, visual blurring, hyper / hypo glycemic episodes.  Heme/Lymph: No excessive bleeding, bruising, enlarged lymph nodes.  Physical Exam  BP 120/76   Pulse 80   Temp (!) 97.4 F (36.3 C)   Resp 16   Ht 5' 4.25" (1.632 m)   Wt 185 lb (83.9 kg)   BMI 31.51 kg/m   General Appearance: Over nourished, well groomed and in no apparent distress.  Eyes: PERRLA, EOMs, conjunctiva no swelling or erythema, normal fundi and vessels. Sinuses: No frontal/maxillary tenderness ENT/Mouth: EACs patent / TMs  nl. Nares clear without erythema, swelling, mucoid exudates. Oral hygiene is good. No erythema, swelling, or exudate. Tongue normal, non-obstructing. Tonsils not swollen or erythematous. Hearing normal.  Neck: Supple, thyroid normal. No bruits, nodes or JVD. Respiratory: Respiratory effort normal.  BS equal and clear  bilateral without rales, rhonci, wheezing or stridor. Cardio: Heart sounds are normal with regular rate and rhythm and no murmurs, rubs or gallops. Peripheral pulses are normal and equal bilaterally without edema. No aortic or femoral bruits. Chest: symmetric with normal excursions and percussion. Breasts: Symmetric, without lumps, nipple discharge, retractions, or fibrocystic changes.  Abdomen: Flat, soft with bowel sounds active. Nontender, no guarding, rebound, hernias, masses, or organomegaly.  Lymphatics: Non tender without lymphadenopathy.  Musculoskeletal: Full ROM all peripheral extremities, joint stability, 5/5 strength, and normal gait. Skin: Warm and dry without rashes, lesions, cyanosis, clubbing or  ecchymosis.  Neuro: Cranial nerves intact, reflexes equal bilaterally. Normal muscle tone, no cerebellar symptoms. Sensation intact.  Pysch: Alert and oriented X 3, normal affect, Insight and Judgment appropriate.   Assessment and Plan  1. Annual Preventative Screening Examination   2. Labile hypertension  - EKG 12-Lead - Urinalysis, Routine w reflex microscopic - Microalbumin / creatinine urine ratio - CBC with Differential/Platelet - BASIC METABOLIC PANEL WITH GFR - Magnesium - TSH  3. Hyperlipidemia, mixed  - EKG 12-Lead - Hepatic function panel - Lipid panel - TSH  4. Prediabetes  - EKG 12-Lead - Hemoglobin A1c - Insulin, random  5. Vitamin D deficiency  - VITAMIN D 25 Hydroxy  6. Abnormal glucose  - Hemoglobin A1c - Insulin, random  7. Hypothyroidism  - TSH  8. Schizoaffective disorder, bipolar  type ([place])   9. Chronic gout   - Uric acid  10. Encounter for colorectal cancer screening  - POC Hemoccult Bld/Stl   11. Screening for ischemic heart disease  - EKG 12-Lead  12. Medication management  - Urinalysis, Routine w reflex microscopic - Microalbumin / creatinine urine ratio - Uric acid - CBC with Differential/Platelet - BASIC  METABOLIC PANEL WITH GFR - Hepatic function panel - Magnesium - Lipid panel - TSH - Hemoglobin A1c - Insulin, random - VITAMIN D 25 Hydroxy         Patient was counseled in prudent diet to achieve/maintain BMI less than 25 for weight control, BP monitoring, regular exercise and medications. Discussed med's effects and SE's. Screening labs and tests as requested with regular follow-up as recommended. Over 40 minutes of exam, counseling, chart review and high complex critical decision making was performed.

## 2017-09-30 NOTE — Patient Instructions (Signed)

## 2017-10-01 ENCOUNTER — Other Ambulatory Visit: Payer: Self-pay | Admitting: Internal Medicine

## 2017-10-01 DIAGNOSIS — N390 Urinary tract infection, site not specified: Secondary | ICD-10-CM

## 2017-10-01 LAB — BASIC METABOLIC PANEL WITH GFR
BUN / CREAT RATIO: 12 (calc) (ref 6–22)
BUN: 12 mg/dL (ref 7–25)
CALCIUM: 10.3 mg/dL (ref 8.6–10.4)
CO2: 26 mmol/L (ref 20–32)
Chloride: 102 mmol/L (ref 98–110)
Creat: 1.02 mg/dL — ABNORMAL HIGH (ref 0.50–0.99)
GFR, EST AFRICAN AMERICAN: 68 mL/min/{1.73_m2} (ref >=60)
GFR, EST NON AFRICAN AMERICAN: 59 mL/min/{1.73_m2} — AB (ref >=60)
Glucose, Bld: 91 mg/dL (ref 65–99)
Potassium: 4.7 mmol/L (ref 3.5–5.3)
Sodium: 138 mmol/L (ref 135–146)

## 2017-10-01 LAB — LIPID PANEL
CHOL/HDL RATIO: 5.8 (calc) — AB (ref <5.0)
Cholesterol: 207 mg/dL — ABNORMAL HIGH (ref <200)
HDL: 36 mg/dL — ABNORMAL LOW (ref >50)
LDL CHOLESTEROL (CALC): 141 mg/dL — AB
NON-HDL CHOLESTEROL (CALC): 171 mg/dL — AB (ref <130)
TRIGLYCERIDES: 161 mg/dL — AB (ref <150)

## 2017-10-01 LAB — HEMOGLOBIN A1C
HEMOGLOBIN A1C: 5.1 %{Hb} (ref <5.7)
Mean Plasma Glucose: 100 (calc)
eAG (mmol/L): 5.5 (calc)

## 2017-10-01 LAB — URINALYSIS, ROUTINE W REFLEX MICROSCOPIC
BILIRUBIN URINE: NEGATIVE
Bacteria, UA: NONE SEEN /HPF
GLUCOSE, UA: NEGATIVE
HYALINE CAST: NONE SEEN /LPF
Ketones, ur: NEGATIVE
Nitrite: NEGATIVE
PROTEIN: NEGATIVE
Specific Gravity, Urine: 1.017 (ref 1.001–1.03)
pH: 6 (ref 5.0–8.0)

## 2017-10-01 LAB — CBC WITH DIFFERENTIAL/PLATELET
BASOS ABS: 123 {cells}/uL (ref 0–200)
Basophils Relative: 1.5 %
EOS PCT: 4.2 %
Eosinophils Absolute: 344 cells/uL (ref 15–500)
HEMATOCRIT: 43.2 % (ref 35.0–45.0)
Hemoglobin: 14.8 g/dL (ref 11.7–15.5)
LYMPHS ABS: 2321 {cells}/uL (ref 850–3900)
MCH: 28.2 pg (ref 27.0–33.0)
MCHC: 34.3 g/dL (ref 32.0–36.0)
MCV: 82.4 fL (ref 80.0–100.0)
MPV: 9.8 fL (ref 7.5–12.5)
Monocytes Relative: 8 %
NEUTROS PCT: 58 %
Neutro Abs: 4756 cells/uL (ref 1500–7800)
PLATELETS: 260 10*3/uL (ref 140–400)
RBC: 5.24 10*6/uL — ABNORMAL HIGH (ref 3.80–5.10)
RDW: 12.7 % (ref 11.0–15.0)
TOTAL LYMPHOCYTE: 28.3 %
WBC mixed population: 656 cells/uL (ref 200–950)
WBC: 8.2 10*3/uL (ref 3.8–10.8)

## 2017-10-01 LAB — HEPATIC FUNCTION PANEL
AG Ratio: 1.5 (calc) (ref 1.0–2.5)
ALBUMIN MSPROF: 4.8 g/dL (ref 3.6–5.1)
ALT: 21 U/L (ref 6–29)
AST: 26 U/L (ref 10–35)
Alkaline phosphatase (APISO): 125 U/L (ref 33–130)
BILIRUBIN DIRECT: 0.1 mg/dL (ref 0.0–0.2)
BILIRUBIN INDIRECT: 0.6 mg/dL (ref 0.2–1.2)
BILIRUBIN TOTAL: 0.7 mg/dL (ref 0.2–1.2)
GLOBULIN: 3.1 g/dL (ref 1.9–3.7)
Total Protein: 7.9 g/dL (ref 6.1–8.1)

## 2017-10-01 LAB — VITAMIN D 25 HYDROXY (VIT D DEFICIENCY, FRACTURES): Vit D, 25-Hydroxy: 79 ng/mL (ref 30–100)

## 2017-10-01 LAB — MICROALBUMIN / CREATININE URINE RATIO
CREATININE, URINE: 159 mg/dL (ref 20–275)
MICROALB/CREAT RATIO: 17 ug/mg{creat} (ref <30)
Microalb, Ur: 2.7 mg/dL

## 2017-10-01 LAB — INSULIN, RANDOM: INSULIN: 14.4 u[IU]/mL (ref 2.0–19.6)

## 2017-10-01 LAB — MAGNESIUM: MAGNESIUM: 2.1 mg/dL (ref 1.5–2.5)

## 2017-10-01 LAB — URIC ACID: URIC ACID, SERUM: 8.1 mg/dL — AB (ref 2.5–7.0)

## 2017-10-01 LAB — TSH: TSH: 0.4 m[IU]/L (ref 0.40–4.50)

## 2017-10-01 MED ORDER — CIPROFLOXACIN HCL 250 MG PO TABS: ORAL_TABLET | ORAL | 0 refills | Status: DC

## 2017-10-02 ENCOUNTER — Other Ambulatory Visit: Payer: Self-pay | Admitting: *Deleted

## 2017-10-02 MED ORDER — ROSUVASTATIN CALCIUM 40 MG PO TABS
ORAL_TABLET | ORAL | 0 refills | Status: DC
Start: 2017-10-02 — End: 2018-07-15

## 2017-10-02 MED ORDER — ALLOPURINOL 300 MG PO TABS: 300.0000 mg | ORAL_TABLET | Freq: Every day | ORAL | 1 refills | Status: DC

## 2017-10-03 ENCOUNTER — Other Ambulatory Visit: Payer: Self-pay | Admitting: Internal Medicine

## 2017-10-20 ENCOUNTER — Other Ambulatory Visit: Payer: Self-pay | Admitting: Internal Medicine

## 2017-10-20 DIAGNOSIS — Z1231 Encounter for screening mammogram for malignant neoplasm of breast: Secondary | ICD-10-CM

## 2017-11-04 ENCOUNTER — Other Ambulatory Visit: Payer: Self-pay

## 2017-11-04 ENCOUNTER — Ambulatory Visit (INDEPENDENT_AMBULATORY_CARE_PROVIDER_SITE_OTHER): Payer: PPO

## 2017-11-04 DIAGNOSIS — N390 Urinary tract infection, site not specified: Secondary | ICD-10-CM

## 2017-11-04 NOTE — Progress Notes (Signed)
Pt reports she has finished her ABX and has no other issues or concerns at this time.

## 2017-11-04 NOTE — Addendum Note (Signed)
Addended by: Eulis Canner on: 11/04/2017 02:01 PM   Modules accepted: Orders

## 2017-11-05 ENCOUNTER — Other Ambulatory Visit: Payer: Self-pay | Admitting: Internal Medicine

## 2017-11-05 DIAGNOSIS — N3000 Acute cystitis without hematuria: Secondary | ICD-10-CM

## 2017-11-05 LAB — URINE CULTURE
MICRO NUMBER: 90091675
SPECIMEN QUALITY:: ADEQUATE

## 2017-11-05 LAB — URINALYSIS, ROUTINE W REFLEX MICROSCOPIC
BACTERIA UA: NONE SEEN /HPF
Bilirubin Urine: NEGATIVE
Glucose, UA: NEGATIVE
Ketones, ur: NEGATIVE
Nitrite: NEGATIVE
PROTEIN: NEGATIVE
RBC / HPF: NONE SEEN /HPF (ref 0–2)
SQUAMOUS EPITHELIAL / LPF: NONE SEEN /HPF (ref <=5)
Specific Gravity, Urine: 1.014 (ref 1.001–1.03)
pH: 6 (ref 5.0–8.0)

## 2017-11-05 LAB — MICROSCOPIC MESSAGE

## 2017-11-05 MED ORDER — AMOXICILLIN 250 MG PO CAPS: ORAL_CAPSULE | ORAL | 0 refills | Status: DC

## 2017-11-11 ENCOUNTER — Ambulatory Visit
Admission: RE | Admit: 2017-11-11 | Discharge: 2017-11-11 | Disposition: A | Discharge status: Home / Self Care | Payer: PPO | Source: Ambulatory Visit | Attending: Internal Medicine | Admitting: Internal Medicine

## 2017-11-11 DIAGNOSIS — Z1231 Encounter for screening mammogram for malignant neoplasm of breast: Secondary | ICD-10-CM | POA: Diagnosis not present

## 2017-12-09 ENCOUNTER — Ambulatory Visit (INDEPENDENT_AMBULATORY_CARE_PROVIDER_SITE_OTHER): Payer: PPO | Admitting: *Deleted

## 2017-12-09 DIAGNOSIS — N3000 Acute cystitis without hematuria: Secondary | ICD-10-CM

## 2017-12-09 NOTE — Progress Notes (Signed)
Patient here for a NV to recheck a UA and culture.  The patient has finished her Amoxicillin 250 mg and states she is having no discomfort when urinating.

## 2017-12-10 LAB — URINALYSIS, ROUTINE W REFLEX MICROSCOPIC
Bacteria, UA: NONE SEEN /HPF
Bilirubin Urine: NEGATIVE
Glucose, UA: NEGATIVE
HYALINE CAST: NONE SEEN /LPF
KETONES UR: NEGATIVE
Nitrite: NEGATIVE
Protein, ur: NEGATIVE
RBC / HPF: NONE SEEN /HPF (ref 0–2)
SPECIFIC GRAVITY, URINE: 1.017 (ref 1.001–1.03)
pH: 6 (ref 5.0–8.0)

## 2017-12-10 LAB — URINE CULTURE
MICRO NUMBER: 90251816
RESULT: NO GROWTH
SPECIMEN QUALITY: ADEQUATE

## 2017-12-30 ENCOUNTER — Other Ambulatory Visit: Payer: Self-pay | Admitting: Internal Medicine

## 2018-01-06 NOTE — Progress Notes (Signed)
Patient ID: Rebekah Pace, female   DOB: 1956-05-15, 62 y.o.   MRN: [id number]  Medicare  Annual  Wellness Visit And follow up  Assessment:   Encounter for Medicare annual wellness exam 1 year  Labile hypertension - continue medications, DASH diet, exercise and monitor at home. Call if greater than 130/80.  -     CBC with Differential/Platelet -     BASIC METABOLIC PANEL WITH GFR -     Hepatic function panel -     TSH -     Urinalysis, Routine w reflex microscopic -     Urine Culture  Chronic hepatitis C without hepatic coma (HCC) -     Hepatic function panel  Hepatic fibrosis Will send labs to GI Some RUQ pain with palpation and some nausea with food- will check labs She will discuss this with her GI doctor, if alk phos elevated, will repeat Ab [place] sooner due to GB polyp Avoid fatty foods -     Hepatic function panel -     Protime-INR -     AFP Tumor Marker  Schizoaffective disorder, bipolar type ([place]) Continue follow up psych  Recurrent major depressive disorder, in full remission ([place]) Continue follow up psych  Medication management -     Magnesium  Mixed hyperlipidemia -continue medications, check lipids, decrease fatty foods, increase activity.  DID NOT START MEDICATION, WILL CHECK THIS VISIT AND START PENDING LAB- CRESTOR 1/2 PILL 2-3 X A WEEK -     Lipid panel  Bipolar affective disorder, depressed, severe, with psychotic behavior ([place]) Continue follow up psych  Vitamin D deficiency -     VITAMIN D 25 Hydroxy (Vit-D Deficiency, Fractures)  Hypothyroidism, unspecified type Hypothyroidism-check TSH level, continue medications the same, reminded to take on an empty stomach 30-58mns before food.   Chronic gout without tophus, unspecified cause, unspecified site Gout- recheck Uric acid as needed, Diet discussed, continue medications.  BMI 29.0-29.9,adult  Overweight  - long discussion about weight loss, diet, and exercise -recommended diet heavy in fruits and  veggies and low in animal meats, cheeses, and dairy products - continue doing a great job with weight loss!!  Insulin resistance -     Hemoglobin A1c - patient on abilify  Insomnia Insomnia- good sleep hygiene discussed, increase day time activity, try melatonin, if this does not help we will call in sleep medication.  -     traZODone (DESYREL) 50 MG tablet; 1/2-1 tablet for sleep    Future Appointments  Date Time Provider DRenfrow 01/26/2018  8:00 AM [facility] [email or web address]  04/09/2018 11:30 AM MUnk Pinto MD [facility] None  10/22/2018  2:00 PM MUnk Pinto MD [facility] None    Plan:   During the course of the visit the patient was educated and counseled about appropriate screening and preventive services including:    Pneumococcal vaccine   Influenza vaccine  Td vaccine  Screening electrocardiogram  Bone densitometry screening  Colorectal cancer screening  Diabetes screening  Glaucoma screening  Nutrition counseling   Advanced directives: requested   Subjective:   Rebekah Pace 62y.o. DWF,  presents for Medicare Annual Wellness Visit and follow up  for follow up with Hypertension, Hyperlipidemia, Pre-Diabetes, Hypothyroidism  and Vitamin D Deficiency.   She [facility], treated with epclusa. 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 She did not start the crestor due to this. She has  lost 33 lbs.   In the spring of 2013 she had a psychotic break, has had several psych admissions, Dx'd Bipolar Manic Depressive. Currently she is followed by [facility] by a staff therapist & Psychiatrist to manage her psychotropic medications. She is on Abilify, not on zoloft.   Has had trouble getting to sleep x several weeks, she gets up to pee a lot at night. She has been urinating more during the day but denies incontinence, abnormal color/odor, urgency. She has  tried sleepy time tea and 6 mg melatonin.   Patient has hx/o labile HTN circa 2005 and BP has been controlled and today's BP: 130/80She is walking daily, 40 mins daily and denies any cardiac type chest pain, palpitations, dyspnea/orthopnea/PND, dizziness, claudication, or dependent edema.   She has never started the crestor, she has lost 33 lbs and is working hard on diet/exercies. Patient denies myalgias or other med SE's. Last Lipids were at goal with  Lab Results  Component Value Date   CHOL 207 (H) 09/30/2017   HDL 36 (L) 09/30/2017   LDLCALC 141 (H) 09/30/2017   TRIG 161 (H) 09/30/2017   CHOLHDL 5.8 (H) 09/30/2017   Also, the patient has history of Insulin Resistance with A1c 5.5% and elevated Insulin level 35 in Jan 2014 and has had no symptoms of reactive hypoglycemia, diabetic polys, paresthesias or visual blurring.   Lab Results  Component Value Date   HGBA1C 5.1 09/30/2017   Patient has been on thyroid replacement circa 2005 and remained in therapeutic range on routine monitoring.  Lab Results  Component Value Date   TSH 0.40 09/30/2017   Further, the patient also has history of Vitamin D Deficiency of 29 in June 2015 and she is not currently on Vit d supplements. Lab Results  Component Value Date   VD25OH 79 09/30/2017   BMI is Body mass index is 29.4 kg/m., she is working on diet and exercise. She is walking daily.  Wt Readings from Last 3 Encounters:  01/07/18 172 lb 9.6 oz (78.3 kg)  09/30/17 185 lb (83.9 kg)  05/28/17 194 lb 6.4 oz (88.2 kg)   She is on thyroid medication. Her medication was not changed last visit.   Lab Results  Component Value Date   TSH 0.40 09/30/2017  .   Medication Review: Current Outpatient Medications on File Prior to Visit  Medication Sig  . ARIPiprazole (ABILIFY) 10 MG tablet Take 10 mg by mouth daily.  Marland Kitchen levothyroxine (SYNTHROID, LEVOTHROID) 75 MCG tablet TAKE 1 TABLET BY MOUTH EVERY DAY  . MAGNESIUM PO Take by mouth.  Marland Kitchen OVER  THE COUNTER MEDICATION   . rosuvastatin (CRESTOR) 40 MG tablet Take 1/2 to 1 tablet daily as directed.  Marland Kitchen VITAMIN D, CHOLECALCIFEROL, PO Take 5,000 Units by mouth 2 (two) times daily.    No current facility-administered medications on file prior to visit.     Current Problems (verified) Patient Active Problem List   Diagnosis Date Noted  . Hepatic fibrosis 11/23/2016  . Gout 05/17/2016  . HCV (hepatitis C virus) 01/31/2016  . Morbid obesity ([place])  (BMI 31.84) 07/24/2015  . Hypothyroidism 12/13/2014  . Mixed hyperlipidemia 09/01/2014  . Vitamin D deficiency 09/01/2014  . Bipolar affective disorder, depressed, severe, with psychotic behavior ([place]) 09/01/2014  . Medication management 03/30/2014  . Major depression in full remission ([place])   . Labile hypertension   . Schizoaffective disorder, bipolar type ([place]) 09/12/2011    Screening  Tests Immunization History  Administered Date(s) Administered  . Pneumococcal-Unspecified 10/14/2001  . Td 10/15/1995   Preventative care: Last colonoscopy: 03/2001 by Dr Deatra Ina  Cologuard 06/2017 negative MGM 10/2017 Cat B [place] Ab 09/2015 CXR 2013 PAP 2016 Ct renal stone 01/2017  Declines vaccines  Names of Other Physician/Practitioners you currently use: 1. [facility] here for primary care 2. Dr Sherlean Foot, [place], eye doctor, last visit 2014  3. Dr Clarene Essex, dentist, last visit 2018  Patient Care Team: Unk Pinto, MD as PCP - General (Internal Medicine) Garrel Ridgel, [place] as Consulting Physician (Podiatry) Inda Castle, MD (Inactive) as Consulting Physician (Gastroenterology) Thalia Bloodgood, OD as Referring Physician (Optometry)  Allergies Allergies  Allergen Reactions  . Sulfa Antibiotics Rash    SURGICAL HISTORY She  has a past surgical history that includes arm fracture and Rotator cuff repair. FAMILY HISTORY Her family history includes Cancer in her father; Colon polyps in her mother;  Hyperlipidemia in her brother and mother; Hypertension in her mother. SOCIAL HISTORY She  reports that she has never smoked. She has never used smokeless tobacco. She reports that she does not drink alcohol or use drugs.  MEDICARE WELLNESS OBJECTIVES: Physical activity:   Cardiac risk factors:   Depression/mood screen:   Depression screen [facility] 2/9 09/30/2017  Decreased Interest 0  Down, Depressed, Hopeless 0  PHQ - 2 Score 0    ADLs:  In your present state of health, do you have any difficulty performing the following activities: 09/30/2017 05/28/2017  Hearing? N N  Vision? N N  Difficulty concentrating or making decisions? N N  Walking or climbing stairs? N N  Dressing or bathing? N N  Doing errands, shopping? N N  Some recent data might be hidden    Cognitive Testing  Alert? Yes  Normal Appearance?Yes  Oriented to person? Yes  Place? Yes   Time? Yes  Recall of three objects?  Yes  Can perform simple calculations? Yes  Displays appropriate judgment?Yes  Can read the correct time from a watch face?Yes  EOL planning: Does Patient Have a Medical Advance Directive?: No Would patient like information on creating a medical advance directive?: Yes (MAU/Ambulatory/Procedural Areas - Information given)   Objective:     BP 130/80   Pulse 86   Temp (!) 97.4 F (36.3 C)   Resp 14   Ht 5' 4.25" (1.632 m)   Wt 172 lb 9.6 oz (78.3 kg)   SpO2 94%   BMI 29.40 kg/m   General Appearance: Well nourished, alert, WD/WN, female and in no apparent distress. Eyes: PERRLA, EOMs, conjunctiva no swelling or erythema, normal fundi and vessels. Sinuses: No frontal/maxillary tenderness ENT/Mouth: EACs patent / TMs  nl. Nares clear without erythema, swelling, mucoid exudates. Oral hygiene is good. No erythema, swelling, or exudate. Tongue normal, non-obstructing. Tonsils not swollen or erythematous. Hearing normal.  Neck: Supple, thyroid normal. No bruits, nodes or JVD. Respiratory: Respiratory  effort normal.  BS equal and clear bilateral without rales, rhonci, wheezing or stridor. Cardio: Heart sounds are normal with regular rate and rhythm and no murmurs, rubs or gallops. Peripheral pulses are normal and equal bilaterally without edema. No aortic or femoral bruits. Chest: symmetric with normal excursions and percussion. Breasts: declines  Abdomen: Flat, soft  with nl bowel sounds. Diffuse tenderness, no guarding, rebound, hernias, masses, or organomegaly.  No CVA tendernss Lymphatics: Non tender without lymphadenopathy.  Genitourinary:  Musculoskeletal: Full ROM all peripheral extremities, joint stability,  5/5 strength, and normal gait. Skin: Warm and dry without rashes, lesions, cyanosis, clubbing or  ecchymosis.  Neuro: Cranial nerves intact, reflexes equal bilaterally. Normal muscle tone, no cerebellar symptoms. Sensation intact.  Pysch: Alert and oriented X 3, normal affect, Insight and Judgment appropriate.   Medicare Attestation I have personally reviewed: The patient's medical and social history Their use of alcohol, tobacco or illicit drugs Their current medications and supplements The patient's functional ability including ADLs,fall risks, home safety risks, cognitive, and hearing and visual impairment Diet and physical activities Evidence for depression or mood disorders  The patient's weight, height, BMI, and visual acuity have been recorded in the chart.  I have made referrals, counseling, and provided education to the patient based on review of the above and I have provided the patient with a written personalized care plan for preventive services.  Over 40 minutes of exam, counseling, chart review was performed.  Vicie Mutters, PA-C   01/07/2018

## 2018-01-07 ENCOUNTER — Ambulatory Visit (INDEPENDENT_AMBULATORY_CARE_PROVIDER_SITE_OTHER): Payer: PPO | Admitting: Physician Assistant

## 2018-01-07 ENCOUNTER — Encounter: Payer: Self-pay | Admitting: Physician Assistant

## 2018-01-07 VITALS — BP 130/80 | HR 86 | Temp 97.4°F | Resp 14 | Ht 64.25 in | Wt 172.6 lb

## 2018-01-07 DIAGNOSIS — M1A9XX0 Chronic gout, unspecified, without tophus (tophi): Secondary | ICD-10-CM

## 2018-01-07 DIAGNOSIS — F250 Schizoaffective disorder, bipolar type: Secondary | ICD-10-CM | POA: Diagnosis not present

## 2018-01-07 DIAGNOSIS — R6889 Other general symptoms and signs: Secondary | ICD-10-CM | POA: Diagnosis not present

## 2018-01-07 DIAGNOSIS — K7400 Hepatic fibrosis, unspecified: Secondary | ICD-10-CM

## 2018-01-07 DIAGNOSIS — F3342 Major depressive disorder, recurrent, in full remission: Secondary | ICD-10-CM | POA: Diagnosis not present

## 2018-01-07 DIAGNOSIS — E039 Hypothyroidism, unspecified: Secondary | ICD-10-CM | POA: Diagnosis not present

## 2018-01-07 DIAGNOSIS — E8881 Metabolic syndrome: Secondary | ICD-10-CM | POA: Diagnosis not present

## 2018-01-07 DIAGNOSIS — F315 Bipolar disorder, current episode depressed, severe, with psychotic features: Secondary | ICD-10-CM

## 2018-01-07 DIAGNOSIS — E559 Vitamin D deficiency, unspecified: Secondary | ICD-10-CM | POA: Diagnosis not present

## 2018-01-07 DIAGNOSIS — E782 Mixed hyperlipidemia: Secondary | ICD-10-CM

## 2018-01-07 DIAGNOSIS — Z0001 Encounter for general adult medical examination with abnormal findings: Secondary | ICD-10-CM | POA: Diagnosis not present

## 2018-01-07 DIAGNOSIS — E88819 Insulin resistance, unspecified: Secondary | ICD-10-CM

## 2018-01-07 DIAGNOSIS — Z6829 Body mass index (BMI) 29.0-29.9, adult: Secondary | ICD-10-CM | POA: Diagnosis not present

## 2018-01-07 DIAGNOSIS — Z0000 Encounter for general adult medical examination without abnormal findings: Secondary | ICD-10-CM

## 2018-01-07 DIAGNOSIS — Z79899 Other long term (current) drug therapy: Secondary | ICD-10-CM | POA: Diagnosis not present

## 2018-01-07 DIAGNOSIS — B182 Chronic viral hepatitis C: Secondary | ICD-10-CM | POA: Diagnosis not present

## 2018-01-07 DIAGNOSIS — R0989 Other specified symptoms and signs involving the circulatory and respiratory systems: Secondary | ICD-10-CM | POA: Diagnosis not present

## 2018-01-07 MED ORDER — TRAZODONE HCL 50 MG PO TABS: ORAL_TABLET | ORAL | 2 refills | Status: DC

## 2018-01-07 NOTE — Patient Instructions (Addendum)
Try the melatonin 5mg -20mg  dissolvable or gummy 30 mins before bed Going to start you on trazadone, start on 1/2 pill for 3-5 nights, can increase to a whole pill for 1-2 weeks.  Let me know how you are doing with this.   11 Tips to Follow:  1. No caffeine after 3pm: Avoid beverages with caffeine (soda, tea, energy drinks, etc.) especially after 3pm. 2. Don't go to bed hungry: Have your evening meal at least 3 hrs. before going to sleep. It's fine to have a small bedtime snack such as a glass of milk and a few crackers but don't have a big meal. 3. Have a nightly routine before bed: Plan on "winding down" before you go to sleep. Begin relaxing about 1 hour before you go to bed. Try doing a quiet activity such as listening to calming music, reading a book or meditating. 4. Turn off the TV and ALL electronics including video games, tablets, laptops, etc. 1 hour before sleep, and keep them out of the bedroom. 5. Turn off your cell phone and all notifications (new email and text alerts) or even better, leave your phone outside your room while you sleep. Studies have shown that a part of your brain continues to respond to certain lights and sounds even while you're still asleep. 6. Make your bedroom quiet, dark and cool. If you can't control the noise, try wearing earplugs or using a fan to block out other sounds. 7. Practice relaxation techniques. Try reading a book or meditating or drain your brain by writing a list of what you need to do the next day. 8. Don't nap unless you feel sick: you'll have a better night's sleep. 9. Don't smoke, or quit if you do. Nicotine, alcohol, and marijuana can all keep you awake. Talk to your health care provider if you need help with substance use. 10. Most importantly, wake up at the same time every day (or within 1 hour of your usual wake up time) EVEN on the weekends. A regular wake up time promotes sleep hygiene and prevents sleep problems. 11. Reduce exposure to  bright light in the last three hours of the day before going to sleep. Maintaining good sleep hygiene and having good sleep habits lower your risk of developing sleep problems. Getting better sleep can also improve your concentration and alertness. Try the simple steps in this guide. If you still have trouble getting enough rest, make an appointment with your health care provider.     Avoid alcohol, spicy foods, NSAIDS (aleve, ibuprofen) at this time. See foods below.   Food Choices for Gastroesophageal Reflux Disease When you have gastroesophageal reflux disease (GERD), the foods you eat and your eating habits are very important. Choosing the right foods can help ease the discomfort of GERD. WHAT GENERAL GUIDELINES DO I NEED TO FOLLOW?  Choose fruits, vegetables, whole grains, low-fat dairy products, and low-fat meat, fish, and poultry.  Limit fats such as oils, salad dressings, butter, nuts, and avocado.  Keep a food diary to identify foods that cause symptoms.  Avoid foods that cause reflux. These may be different for different people.  Eat frequent small meals instead of three large meals each day.  Eat your meals slowly, in a relaxed setting.  Limit fried foods.  Cook foods using methods other than frying.  Avoid drinking alcohol.  Avoid drinking large amounts of liquids with your meals.  Avoid bending over or lying down until 2-3 hours after eating. WHAT FOODS ARE  NOT RECOMMENDED? The following are some foods and drinks that may worsen your symptoms: Vegetables Tomatoes. Tomato juice. Tomato and spaghetti sauce. Chili peppers. Onion and garlic. Horseradish. Fruits Oranges, grapefruit, and lemon (fruit and juice). Meats High-fat meats, fish, and poultry. This includes hot dogs, ribs, ham, sausage, salami, and bacon. Dairy Whole milk and chocolate milk. Sour cream. Cream. Butter. Ice cream. Cream cheese.  Beverages Coffee and tea, with or without caffeine. Carbonated  beverages or energy drinks. Condiments Hot sauce. Barbecue sauce.  Sweets/Desserts Chocolate and cocoa. Donuts. Peppermint and spearmint. Fats and Oils High-fat foods, including [place] fries and potato chips. Other Vinegar. Strong spices, such as black pepper, white pepper, red pepper, cayenne, curry powder, cloves, ginger, and chili powder.

## 2018-01-08 LAB — URINALYSIS, ROUTINE W REFLEX MICROSCOPIC
BILIRUBIN URINE: NEGATIVE
Bacteria, UA: NONE SEEN /HPF
GLUCOSE, UA: NEGATIVE
Hyaline Cast: NONE SEEN /LPF
Ketones, ur: NEGATIVE
NITRITE: NEGATIVE
PH: 6 (ref 5.0–8.0)
Protein, ur: NEGATIVE
RBC / HPF: NONE SEEN /HPF (ref 0–2)
SPECIFIC GRAVITY, URINE: 1.007 (ref 1.001–1.03)
Squamous Epithelial / LPF: NONE SEEN /HPF (ref <=5)

## 2018-01-08 LAB — CBC WITH DIFFERENTIAL/PLATELET
BASOS ABS: 71 {cells}/uL (ref 0–200)
Basophils Relative: 1.2 %
Eosinophils Absolute: 100 cells/uL (ref 15–500)
Eosinophils Relative: 1.7 %
HEMATOCRIT: 41.4 % (ref 35.0–45.0)
Hemoglobin: 14.3 g/dL (ref 11.7–15.5)
LYMPHS ABS: 1758 {cells}/uL (ref 850–3900)
MCH: 29.1 pg (ref 27.0–33.0)
MCHC: 34.5 g/dL (ref 32.0–36.0)
MCV: 84.3 fL (ref 80.0–100.0)
MPV: 10.1 fL (ref 7.5–12.5)
Monocytes Relative: 6.6 %
NEUTROS PCT: 60.7 %
Neutro Abs: 3581 cells/uL (ref 1500–7800)
Platelets: 228 10*3/uL (ref 140–400)
RBC: 4.91 10*6/uL (ref 3.80–5.10)
RDW: 12.8 % (ref 11.0–15.0)
Total Lymphocyte: 29.8 %
WBC: 5.9 10*3/uL (ref 3.8–10.8)
WBCMIX: 389 {cells}/uL (ref 200–950)

## 2018-01-08 LAB — BASIC METABOLIC PANEL WITH GFR
BUN/Creatinine Ratio: 11 (calc) (ref 6–22)
BUN: 12 mg/dL (ref 7–25)
CALCIUM: 10.2 mg/dL (ref 8.6–10.4)
CO2: 27 mmol/L (ref 20–32)
CREATININE: 1.12 mg/dL — AB (ref 0.50–0.99)
Chloride: 104 mmol/L (ref 98–110)
GFR, EST AFRICAN AMERICAN: 61 mL/min/{1.73_m2} (ref >=60)
GFR, Est Non African American: 53 mL/min/{1.73_m2} — ABNORMAL LOW (ref >=60)
Glucose, Bld: 100 mg/dL — ABNORMAL HIGH (ref 65–99)
POTASSIUM: 4.9 mmol/L (ref 3.5–5.3)
SODIUM: 138 mmol/L (ref 135–146)

## 2018-01-08 LAB — LIPID PANEL
CHOL/HDL RATIO: 4.2 (calc) (ref <5.0)
Cholesterol: 189 mg/dL (ref <200)
HDL: 45 mg/dL — ABNORMAL LOW (ref >50)
LDL CHOLESTEROL (CALC): 120 mg/dL — AB
Non-HDL Cholesterol (Calc): 144 mg/dL (calc) — ABNORMAL HIGH (ref <130)
Triglycerides: 128 mg/dL (ref <150)

## 2018-01-08 LAB — HEPATIC FUNCTION PANEL
AG Ratio: 1.7 (calc) (ref 1.0–2.5)
ALBUMIN MSPROF: 4.7 g/dL (ref 3.6–5.1)
ALT: 17 U/L (ref 6–29)
AST: 19 U/L (ref 10–35)
Alkaline phosphatase (APISO): 113 U/L (ref 33–130)
BILIRUBIN DIRECT: 0.1 mg/dL (ref 0.0–0.2)
BILIRUBIN TOTAL: 0.7 mg/dL (ref 0.2–1.2)
Globulin: 2.7 g/dL (calc) (ref 1.9–3.7)
Indirect Bilirubin: 0.6 mg/dL (calc) (ref 0.2–1.2)
Total Protein: 7.4 g/dL (ref 6.1–8.1)

## 2018-01-08 LAB — URINE CULTURE
MICRO NUMBER:: 90384270
SPECIMEN QUALITY:: ADEQUATE

## 2018-01-08 LAB — PROTIME-INR
INR: 1
PROTHROMBIN TIME: 11.1 s (ref 9.0–11.5)

## 2018-01-08 LAB — VITAMIN D 25 HYDROXY (VIT D DEFICIENCY, FRACTURES): Vit D, 25-Hydroxy: 68 ng/mL (ref 30–100)

## 2018-01-08 LAB — HEMOGLOBIN A1C
EAG (MMOL/L): 5.5 (calc)
Hgb A1c MFr Bld: 5.1 % of total Hgb (ref <5.7)
MEAN PLASMA GLUCOSE: 100 (calc)

## 2018-01-08 LAB — TSH: TSH: 0.69 mIU/L (ref 0.40–4.50)

## 2018-01-08 LAB — MAGNESIUM: MAGNESIUM: 2.3 mg/dL (ref 1.5–2.5)

## 2018-01-08 LAB — AFP TUMOR MARKER: AFP TUMOR MARKER: 12 ng/mL — AB

## 2018-01-14 ENCOUNTER — Telehealth: Payer: Self-pay | Admitting: Physician Assistant

## 2018-01-14 NOTE — Telephone Encounter (Signed)
Patient requests 01-08-18 labs faxed to Dr Claretha Cooper, f [place], [facility] [phone]. fax completed

## 2018-01-26 ENCOUNTER — Ambulatory Visit
Admission: RE | Admit: 2018-01-26 | Discharge: 2018-01-26 | Disposition: A | Discharge status: Home / Self Care | Payer: PPO | Source: Ambulatory Visit | Attending: Nurse Practitioner | Admitting: Nurse Practitioner

## 2018-01-26 DIAGNOSIS — K7460 Unspecified cirrhosis of liver: Secondary | ICD-10-CM | POA: Diagnosis not present

## 2018-01-26 DIAGNOSIS — K7469 Other cirrhosis of liver: Secondary | ICD-10-CM

## 2018-03-31 ENCOUNTER — Other Ambulatory Visit: Payer: Self-pay | Admitting: Adult Health

## 2018-04-08 NOTE — Progress Notes (Signed)
.      This very nice 62 y.o. DWF presents for 3 month follow up with HTN, HLD, Pre-Diabetes and Vitamin D Deficiency. Patient is also on SS Disability for BiPolar Manic Depressive Disorder w/Psychosis. Patient has Gout controlled on her meds.      She also is s/p successful Tx of Hep C hx/o Hep C s/p Epclusa x 90 days treatment  (Mar-May 2017) to apparent cure w/ F4 fibrosis and hepatic steatosis by U/S. She continues 6 monthly f/u at the Hepatitis clinic.      Patient is followed expectantly for labile  HTN since 2005 & BP has been controlled at home. Today's BP is at goal - 122/68. Patient has had no complaints of any cardiac type chest pain, palpitations, dyspnea / orthopnea / PND, dizziness, claudication, or dependent edema.     Hyperlipidemia is not controlled with diet & meds. Patient denies myalgias or other med SE's. Last Lipids were not at goal: Lab Results  Component Value Date   CHOL 189 01/07/2018   HDL 45 (L) 01/07/2018   LDLCALC 120 (H) 01/07/2018   TRIG 128 01/07/2018   CHOLHDL 4.2 01/07/2018      Also, the patient has history of Morbid Obesity (BMI 31+) and PreDiabetes/Insulin resistance and has had no symptoms of reactive hypoglycemia, diabetic polys, paresthesias or visual blurring.  Last A1c was at goal:  Lab Results  Component Value Date   HGBA1C 5.1 01/07/2018      Patient was initiated on Thyroid Replacement in  2002.     Further, the patient also has history of Vitamin D Deficiency (18"/2017 and "29"/2015) and supplements vitamin D without any suspected side-effects. Last vitamin D was at goal: Lab Results  Component Value Date   VD25OH 68 01/07/2018   Current Outpatient Medications on File Prior to Visit  Medication Sig  . ARIPiprazole (ABILIFY) 10 MG tablet Take 10 mg by mouth daily.  Marland Kitchen levothyroxine (SYNTHROID, LEVOTHROID) 75 MCG tablet TAKE 1 TABLET BY MOUTH EVERY DAY  . MAGNESIUM PO Take by mouth.  Marland Kitchen OVER THE COUNTER MEDICATION   . traZODone (DESYREL)  50 MG tablet 1/2-1 tablet for sleep  . VITAMIN D, CHOLECALCIFEROL, PO Take 5,000 Units by mouth 2 (two) times daily.   . rosuvastatin (CRESTOR) 40 MG tablet Take 1/2 to 1 tablet daily as directed. (Patient not taking: Reported on 04/09/2018)   No current facility-administered medications on file prior to visit.    Allergies  Allergen Reactions  . Sulfa Antibiotics Rash   PMHx:   Past Medical History:  Diagnosis Date  . Anxiety   . Depression   . Gout 05/17/2016  . Hepatitis   . Hypothyroidism   . Labile hypertension   . Mental disorder   . Prediabetes   . Thyroid disease    Immunization History  Administered Date(s) Administered  . Pneumococcal-Unspecified 10/14/2001  . Td 10/15/1995   Past Surgical History:  Procedure Laterality Date  . arm fracture    . ROTATOR CUFF REPAIR     FHx:    Reviewed / unchanged  SHx:    Reviewed / unchanged   Systems Review:  Constitutional: Denies fever, chills, wt changes, headaches, insomnia, fatigue, night sweats, change in appetite. Eyes: Denies redness, blurred vision, diplopia, discharge, itchy, watery eyes.  ENT: Denies discharge, congestion, post nasal drip, epistaxis, sore throat, earache, hearing loss, dental pain, tinnitus, vertigo, sinus pain, snoring.  CV: Denies chest pain, palpitations, irregular heartbeat, syncope, dyspnea, diaphoresis, orthopnea,  PND, claudication or edema. Respiratory: denies cough, dyspnea, DOE, pleurisy, hoarseness, laryngitis, wheezing.  Gastrointestinal: Denies dysphagia, odynophagia, heartburn, reflux, water brash, abdominal pain or cramps, nausea, vomiting, bloating, diarrhea, constipation, hematemesis, melena, hematochezia  or hemorrhoids. Genitourinary: Denies dysuria, frequency, urgency, nocturia, hesitancy, discharge, hematuria or flank pain. Musculoskeletal: Denies arthralgias, myalgias, stiffness, jt. swelling, pain, limping or strain/sprain.  Skin: Denies pruritus, rash, hives, warts, acne,  eczema or change in skin lesion(s). Neuro: No weakness, tremor, incoordination, spasms, paresthesia or pain. Psychiatric: Denies confusion, memory loss or sensory loss. Endo: Denies change in weight, skin or hair change.  Heme/Lymph: No excessive bleeding, bruising or enlarged lymph nodes.  Physical Exam  BP 122/68   Pulse 68   Temp 97.8 F (36.6 C)   Resp 16   Ht 5' 4.25" (1.632 m)   Wt 164 lb 3.2 oz (74.5 kg)   SpO2 97%   BMI 27.97 kg/m   Appears  well nourished, well groomed  and in no distress.  Eyes: PERRLA, EOMs, conjunctiva no swelling or erythema. Sinuses: No frontal/maxillary tenderness ENT/Mouth: EAC's clear, TM's nl w/o erythema, bulging. Nares clear w/o erythema, swelling, exudates. Oropharynx clear without erythema or exudates. Oral hygiene is good. Tongue normal, non obstructing. Hearing intact.  Neck: Supple. Thyroid not palpable. Car 2+/2+ without bruits, nodes or JVD. Chest: Respirations nl with BS clear & equal w/o rales, rhonchi, wheezing or stridor.  Cor: Heart sounds normal w/ regular rate and rhythm without sig. murmurs, gallops, clicks or rubs. Peripheral pulses normal and equal  without edema.  Abdomen: Soft & bowel sounds normal. Non-tender w/o guarding, rebound, hernias, masses or organomegaly.  Lymphatics: Unremarkable.  Musculoskeletal: Full ROM all peripheral extremities, joint stability, 5/5 strength and normal gait.  Skin: Warm, dry without exposed rashes, lesions or ecchymosis apparent.  Neuro: Cranial nerves intact, reflexes equal bilaterally. Sensory-motor testing grossly intact. Tendon reflexes grossly intact.  Pysch: Alert & oriented x 3.  Insight and judgement nl & appropriate. No ideations.  Assessment and Plan:  1. Labile hypertension  - Continue medication, monitor blood pressure at home.  - Continue DASH diet.  Reminder to go to the ER if any CP,  SOB, nausea, dizziness, severe HA, changes vision/speech.  - CBC with  Differential/Platelet - COMPLETE METABOLIC PANEL WITH GFR - Magnesium - TSH  2. Hyperlipidemia, mixed  - Continue diet/meds, exercise,& lifestyle modifications.  - Continue monitor periodic cholesterol/liver & renal functions   - Lipid panel - TSH  3. Abnormal glucose  - Continue diet, exercise, lifestyle modifications.  - Monitor appropriate labs.  - Hemoglobin A1c - Insulin, random  4. Vitamin D deficiency  - Continue supplementation.   - VITAMIN D 25 Hydroxyl  5. Insulin resistance  - Hemoglobin A1c - Insulin, random  6. Idiopathic gout  - Uric acid  7. Hypothyroidism  - TSH  8. NAFLD (nonalcoholic fatty liver disease)  - COMPLETE METABOLIC PANEL WITH GFR  9. Medication management  - CBC with Differential/Platelet - COMPLETE METABOLIC PANEL WITH GFR - Magnesium - Lipid panel - TSH - Hemoglobin A1c - Insulin, random - VITAMIN D 25 Hydroxy - Uric acid      Discussed  regular exercise, BP monitoring, weight control to achieve/maintain BMI less than 25 and discussed med and SE's. Recommended labs to assess and monitor clinical status with further disposition pending results of labs. Over 30 minutes of exam, counseling, chart review was performed.

## 2018-04-08 NOTE — Patient Instructions (Signed)

## 2018-04-09 ENCOUNTER — Encounter: Payer: Self-pay | Admitting: Internal Medicine

## 2018-04-09 ENCOUNTER — Ambulatory Visit (INDEPENDENT_AMBULATORY_CARE_PROVIDER_SITE_OTHER): Payer: PPO | Admitting: Internal Medicine

## 2018-04-09 VITALS — BP 122/68 | HR 68 | Temp 97.8°F | Resp 16 | Ht 64.25 in | Wt 164.2 lb

## 2018-04-09 DIAGNOSIS — M1000 Idiopathic gout, unspecified site: Secondary | ICD-10-CM | POA: Diagnosis not present

## 2018-04-09 DIAGNOSIS — R7309 Other abnormal glucose: Secondary | ICD-10-CM | POA: Diagnosis not present

## 2018-04-09 DIAGNOSIS — E039 Hypothyroidism, unspecified: Secondary | ICD-10-CM | POA: Diagnosis not present

## 2018-04-09 DIAGNOSIS — E559 Vitamin D deficiency, unspecified: Secondary | ICD-10-CM

## 2018-04-09 DIAGNOSIS — E782 Mixed hyperlipidemia: Secondary | ICD-10-CM | POA: Diagnosis not present

## 2018-04-09 DIAGNOSIS — K760 Fatty (change of) liver, not elsewhere classified: Secondary | ICD-10-CM

## 2018-04-09 DIAGNOSIS — Z79899 Other long term (current) drug therapy: Secondary | ICD-10-CM | POA: Diagnosis not present

## 2018-04-09 DIAGNOSIS — R0989 Other specified symptoms and signs involving the circulatory and respiratory systems: Secondary | ICD-10-CM | POA: Diagnosis not present

## 2018-04-09 DIAGNOSIS — E8881 Metabolic syndrome: Secondary | ICD-10-CM | POA: Diagnosis not present

## 2018-04-10 LAB — COMPLETE METABOLIC PANEL WITH GFR
AG Ratio: 1.6 (calc) (ref 1.0–2.5)
ALKALINE PHOSPHATASE (APISO): 97 U/L (ref 33–130)
ALT: 11 U/L (ref 6–29)
AST: 16 U/L (ref 10–35)
Albumin: 4.5 g/dL (ref 3.6–5.1)
BUN/Creatinine Ratio: 15 (calc) (ref 6–22)
BUN: 17 mg/dL (ref 7–25)
CO2: 25 mmol/L (ref 20–32)
Calcium: 9.8 mg/dL (ref 8.6–10.4)
Chloride: 102 mmol/L (ref 98–110)
Creat: 1.12 mg/dL — ABNORMAL HIGH (ref 0.50–0.99)
GFR, Est African American: 61 mL/min/{1.73_m2} (ref >=60)
GFR, Est Non African American: 53 mL/min/{1.73_m2} — ABNORMAL LOW (ref >=60)
GLOBULIN: 2.8 g/dL (ref 1.9–3.7)
GLUCOSE: 104 mg/dL — AB (ref 65–99)
Potassium: 4.4 mmol/L (ref 3.5–5.3)
SODIUM: 137 mmol/L (ref 135–146)
Total Bilirubin: 0.7 mg/dL (ref 0.2–1.2)
Total Protein: 7.3 g/dL (ref 6.1–8.1)

## 2018-04-10 LAB — LIPID PANEL
CHOLESTEROL: 195 mg/dL (ref <200)
HDL: 44 mg/dL — ABNORMAL LOW (ref >50)
LDL Cholesterol (Calc): 128 mg/dL (calc) — ABNORMAL HIGH
Non-HDL Cholesterol (Calc): 151 mg/dL (calc) — ABNORMAL HIGH (ref <130)
Total CHOL/HDL Ratio: 4.4 (calc) (ref <5.0)
Triglycerides: 123 mg/dL (ref <150)

## 2018-04-10 LAB — TSH: TSH: 0.82 mIU/L (ref 0.40–4.50)

## 2018-04-10 LAB — HEMOGLOBIN A1C
EAG (MMOL/L): 5.4 (calc)
Hgb A1c MFr Bld: 5 % of total Hgb (ref <5.7)
Mean Plasma Glucose: 97 (calc)

## 2018-04-10 LAB — CBC WITH DIFFERENTIAL/PLATELET
BASOS ABS: 69 {cells}/uL (ref 0–200)
Basophils Relative: 1.3 %
Eosinophils Absolute: 133 cells/uL (ref 15–500)
Eosinophils Relative: 2.5 %
HCT: 42.1 % (ref 35.0–45.0)
Hemoglobin: 14.2 g/dL (ref 11.7–15.5)
Lymphs Abs: 1532 cells/uL (ref 850–3900)
MCH: 29.3 pg (ref 27.0–33.0)
MCHC: 33.7 g/dL (ref 32.0–36.0)
MCV: 86.8 fL (ref 80.0–100.0)
MONOS PCT: 6.4 %
MPV: 9.8 fL (ref 7.5–12.5)
NEUTROS PCT: 60.9 %
Neutro Abs: 3228 cells/uL (ref 1500–7800)
Platelets: 233 10*3/uL (ref 140–400)
RBC: 4.85 10*6/uL (ref 3.80–5.10)
RDW: 12.5 % (ref 11.0–15.0)
TOTAL LYMPHOCYTE: 28.9 %
WBC mixed population: 339 cells/uL (ref 200–950)
WBC: 5.3 10*3/uL (ref 3.8–10.8)

## 2018-04-10 LAB — INSULIN, RANDOM: Insulin: 14.6 u[IU]/mL (ref 2.0–19.6)

## 2018-04-10 LAB — URIC ACID: URIC ACID, SERUM: 6.8 mg/dL (ref 2.5–7.0)

## 2018-04-10 LAB — VITAMIN D 25 HYDROXY (VIT D DEFICIENCY, FRACTURES): Vit D, 25-Hydroxy: 54 ng/mL (ref 30–100)

## 2018-04-10 LAB — MAGNESIUM: MAGNESIUM: 2 mg/dL (ref 1.5–2.5)

## 2018-06-28 ENCOUNTER — Other Ambulatory Visit: Payer: Self-pay | Admitting: Internal Medicine

## 2018-06-30 ENCOUNTER — Other Ambulatory Visit: Payer: Self-pay | Admitting: Nurse Practitioner

## 2018-06-30 DIAGNOSIS — K7400 Hepatic fibrosis, unspecified: Secondary | ICD-10-CM

## 2018-07-12 NOTE — Progress Notes (Signed)
Patient ID: Rebekah Pace, female   DOB: 08-02-1956, 62 y.o.   MRN: [id number]   follow up  Assessment:    Labile hypertension - continue medications, DASH diet, exercise and monitor at home. Call if greater than 130/80.  -     CBC with Differential/Platelet -     BASIC METABOLIC PANEL WITH GFR -     Hepatic function panel -     TSH -     Urinalysis, Routine w reflex microscopic -     Urine Culture  Schizoaffective disorder, bipolar type ([place]) Continue follow up psych  Recurrent major depressive disorder, in full remission ([place]) Continue follow up psych  Medication management -     Magnesium  Mixed hyperlipidemia -continue medications, check lipids, decrease fatty foods, increase activity.  -     Lipid panel  Bipolar affective disorder, depressed, severe, with psychotic behavior ([place]) Continue follow up psych  Vitamin D deficiency -     VITAMIN D 25 Hydroxy (Vit-D Deficiency, Fractures)  Hypothyroidism, unspecified type Hypothyroidism-check TSH level, continue medications the same, reminded to take on an empty stomach 30-20mins before food.   BMI 29.0-29.9,adult  Overweight  - long discussion about weight loss, diet, and exercise -recommended diet heavy in fruits and veggies and low in animal meats, cheeses, and dairy products - continue doing a great job with weight loss!!  Hepatic Fibrosis Labs added to send to [facility] liver care fax [phone]   Future Appointments  Date Time Provider [place]  07/31/2018  8:00 AM [facility] [place] [facility]  10/22/2018  2:00 PM Rebekah Pinto, MD [facility] None      Subjective:   Rebekah Pace, 62 y.o. DWF,  presents for follow up  for follow up with Hypertension, Hyperlipidemia, Pre-Diabetes, Hypothyroidism  and Vitamin D Deficiency.   She states last 2-3 weeks has had redness, swelling, irritation on her tailbone. She put hydrocortisone cream, kept clean, and used neosporin on it, improving.   She has been  following with Dr. Zollie Pace at liver care center for hepatitis C, treated with epclusa. 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  She has lost 33 lbs.   In the spring of 2013 she had a psychotic break, has had several psych admissions, Dx'd Bipolar Manic Depressive. Currently she is followed by Rebekah Pace by a staff therapist & Psychiatrist to manage her psychotropic medications. She is on Abilify, not on zoloft.   Patient has hx/o labile HTN circa 2005 and BP has been controlled and today's BP: 110/70  She is walking daily, 40 mins daily and denies any cardiac type chest pain, palpitations, dyspnea/orthopnea/PND, dizziness, claudication, or dependent edema.   She is on Crestor 40mg  1/2 tablet daily. Patient denies myalgias or other med SE's. Last Lipids were at goal with  Lab Results  Component Value Date   CHOL 195 04/09/2018   HDL 44 (L) 04/09/2018   LDLCALC 128 (H) 04/09/2018   TRIG 123 04/09/2018   CHOLHDL 4.4 04/09/2018   Also, the patient has history of Insulin Resistance with A1c 5.5% and elevated Insulin level 35 in Jan 2014 and has had no symptoms of reactive hypoglycemia, diabetic polys, paresthesias or visual blurring.   Lab Results  Component Value Date   HGBA1C 5.0 04/09/2018   Patient has been on thyroid replacement circa 2005 and remained in therapeutic range on routine monitoring.  Lab Results  Component Value Date   TSH 0.82 04/09/2018  Further, the patient also has history of Vitamin D Deficiency of 34 in June 2015 and she is not currently on Vit d supplements. Lab Results  Component Value Date   VD25OH 54 04/09/2018   BMI is Body mass index is 28.78 kg/m., she is working on diet and exercise. She is walking daily.  Wt Readings from Last 3 Encounters:  07/14/18 169 lb (76.7 kg)  04/09/18 164 lb 3.2 oz (74.5 kg)  01/07/18 172 lb 9.6 oz (78.3 kg)   She is on thyroid medication. Her medication was not changed last visit.   Lab Results   Component Value Date   TSH 0.82 04/09/2018  .   Medication Review: Current Outpatient Medications on File Prior to Visit  Medication Sig  . ARIPiprazole (ABILIFY) 10 MG tablet Take 10 mg by mouth daily.  Marland Kitchen levothyroxine (SYNTHROID, LEVOTHROID) 75 MCG tablet TAKE 1 TABLET BY MOUTH EVERY DAY  . MAGNESIUM PO Take by mouth.  Marland Kitchen OVER THE COUNTER MEDICATION   . rosuvastatin (CRESTOR) 40 MG tablet Take 1/2 to 1 tablet daily as directed.  . traZODone (DESYREL) 50 MG tablet 1/2-1 tablet for sleep  . VITAMIN D, CHOLECALCIFEROL, PO Take 5,000 Units by mouth 2 (two) times daily.    No current facility-administered medications on file prior to visit.     Current Problems (verified) Patient Active Problem List   Diagnosis Date Noted  . Hepatic fibrosis 11/23/2016  . Gout 05/17/2016  . HCV (hepatitis C virus) 01/31/2016  . Morbid obesity ([place])  (BMI 31.84) 07/24/2015  . Hypothyroidism 12/13/2014  . Mixed hyperlipidemia 09/01/2014  . Vitamin D deficiency 09/01/2014  . Bipolar affective disorder, depressed, severe, with psychotic behavior ([place]) 09/01/2014  . Medication management 03/30/2014  . Major depression in full remission ([place])   . Labile hypertension   . Schizoaffective disorder, bipolar type ([place]) 09/12/2011     Objective:     BP 110/70   Pulse 80   Temp 97.9 F (36.6 C)   Ht 5' 4.25" (1.632 m)   Wt 169 lb (76.7 kg)   SpO2 97%   BMI 28.78 kg/m   General Appearance: Well nourished, alert, WD/WN, female and in no apparent distress. Eyes: PERRLA, EOMs, conjunctiva no swelling or erythema, normal fundi and vessels. Sinuses: No frontal/maxillary tenderness ENT/Mouth: EACs patent / TMs  nl. Nares clear without erythema, swelling, mucoid exudates. Oral hygiene is good. No erythema, swelling, or exudate. Tongue normal, non-obstructing. Tonsils not swollen or erythematous. Hearing normal.  Neck: Supple, thyroid normal. No bruits, nodes or JVD. Respiratory: Respiratory effort  normal.  BS equal and clear bilateral without rales, rhonci, wheezing or stridor. Cardio: Heart sounds are normal with regular rate and rhythm and no murmurs, rubs or gallops. Peripheral pulses are normal and equal bilaterally without edema. No aortic or femoral bruits. Chest: symmetric with normal excursions and percussion. Breasts: declines  Abdomen: Flat, soft  with nl bowel sounds. Diffuse tenderness, no guarding, rebound, hernias, masses, or organomegaly.  No CVA tendernss Lymphatics: Non tender without lymphadenopathy.  Genitourinary:  Musculoskeletal: Full ROM all peripheral extremities, joint stability, 5/5 strength, and normal gait. Skin: Warm and dry without rashes, lesions, cyanosis, clubbing or  ecchymosis.  Neuro: Cranial nerves intact, reflexes equal bilaterally. Normal muscle tone, no cerebellar symptoms. Sensation intact.  Pysch: Alert and oriented X 3, normal affect, Insight and Judgment appropriate.    Vicie Mutters, PA-C   07/14/2018

## 2018-07-14 ENCOUNTER — Encounter: Payer: Self-pay | Admitting: Physician Assistant

## 2018-07-14 ENCOUNTER — Ambulatory Visit: Payer: Self-pay | Admitting: Adult Health

## 2018-07-14 ENCOUNTER — Ambulatory Visit (INDEPENDENT_AMBULATORY_CARE_PROVIDER_SITE_OTHER): Payer: PPO | Admitting: Physician Assistant

## 2018-07-14 VITALS — BP 110/70 | HR 80 | Temp 97.9°F | Ht 64.25 in | Wt 169.0 lb

## 2018-07-14 DIAGNOSIS — K7400 Hepatic fibrosis, unspecified: Secondary | ICD-10-CM

## 2018-07-14 DIAGNOSIS — E559 Vitamin D deficiency, unspecified: Secondary | ICD-10-CM | POA: Diagnosis not present

## 2018-07-14 DIAGNOSIS — Z79899 Other long term (current) drug therapy: Secondary | ICD-10-CM | POA: Diagnosis not present

## 2018-07-14 DIAGNOSIS — F315 Bipolar disorder, current episode depressed, severe, with psychotic features: Secondary | ICD-10-CM | POA: Diagnosis not present

## 2018-07-14 DIAGNOSIS — F250 Schizoaffective disorder, bipolar type: Secondary | ICD-10-CM

## 2018-07-14 DIAGNOSIS — E039 Hypothyroidism, unspecified: Secondary | ICD-10-CM | POA: Diagnosis not present

## 2018-07-14 DIAGNOSIS — R0989 Other specified symptoms and signs involving the circulatory and respiratory systems: Secondary | ICD-10-CM

## 2018-07-14 DIAGNOSIS — F3342 Major depressive disorder, recurrent, in full remission: Secondary | ICD-10-CM

## 2018-07-14 DIAGNOSIS — E782 Mixed hyperlipidemia: Secondary | ICD-10-CM | POA: Diagnosis not present

## 2018-07-14 NOTE — Patient Instructions (Signed)
FIBER SUPPLEMENT  Benefiber or Citracel is good for constipation/diarrhea/irritable bowel syndrome, it helps with weight loss and can help lower your bad cholesterol. Please do 1 TBSP in the morning in water, coffee, or tea. It can take up to a month before you can see a difference with your bowel movements. It is cheapest from [employer], [employer], [employer].    Constipation, Adult Constipation is when a person has fewer bowel movements in a week than normal, has difficulty having a bowel movement, or has stools that are dry, hard, or larger than normal. Constipation may be caused by an underlying condition. It may become worse with age if a person takes certain medicines and does not take in enough fluids. Follow these instructions at home: Eating and drinking   Eat foods that have a lot of fiber, such as fresh fruits and vegetables, whole grains, and beans.  Limit foods that are high in fat, low in fiber, or overly processed, such as french fries, hamburgers, cookies, candies, and soda.  Drink enough fluid to keep your urine clear or pale yellow. General instructions  Exercise regularly or as told by your health care provider.  Go to the restroom when you have the urge to go. Do not hold it in.  Take over-the-counter and prescription medicines only as told by your health care provider. These include any fiber supplements.  Practice pelvic floor retraining exercises, such as deep breathing while relaxing the lower abdomen and pelvic floor relaxation during bowel movements.  Watch your condition for any changes.  Keep all follow-up visits as told by your health care provider. This is important. Contact a health care provider if:  You have pain that gets worse.  You have a fever.  You do not have a bowel movement after 4 days.  You vomit.  You are not hungry.  You lose weight.  You are bleeding from the anus.  You have thin, pencil-like stools. Get help right away if:  You have  a fever and your symptoms suddenly get worse.  You leak stool or have blood in your stool.  Your abdomen is bloated.  You have severe pain in your abdomen.  You feel dizzy or you faint. This information is not intended to replace advice given to you by your health care provider. Make sure you discuss any questions you have with your health care provider. Document Released: 06/28/2004 Document Revised: 04/19/2016 Document Reviewed: 03/20/2016 Elsevier Interactive Patient Education  2018 [employer].

## 2018-07-15 LAB — COMPLETE METABOLIC PANEL WITH GFR
AG RATIO: 1.7 (calc) (ref 1.0–2.5)
ALBUMIN MSPROF: 4.6 g/dL (ref 3.6–5.1)
ALKALINE PHOSPHATASE (APISO): 100 U/L (ref 33–130)
ALT: 25 U/L (ref 6–29)
AST: 24 U/L (ref 10–35)
BILIRUBIN TOTAL: 0.6 mg/dL (ref 0.2–1.2)
BUN/Creatinine Ratio: 13 (calc) (ref 6–22)
BUN: 13 mg/dL (ref 7–25)
CHLORIDE: 103 mmol/L (ref 98–110)
CO2: 26 mmol/L (ref 20–32)
Calcium: 10 mg/dL (ref 8.6–10.4)
Creat: 1.03 mg/dL — ABNORMAL HIGH (ref 0.50–0.99)
GFR, Est African American: 67 mL/min/{1.73_m2} (ref >=60)
GFR, Est Non African American: 58 mL/min/{1.73_m2} — ABNORMAL LOW (ref >=60)
GLOBULIN: 2.7 g/dL (ref 1.9–3.7)
Glucose, Bld: 92 mg/dL (ref 65–99)
POTASSIUM: 4.1 mmol/L (ref 3.5–5.3)
SODIUM: 139 mmol/L (ref 135–146)
Total Protein: 7.3 g/dL (ref 6.1–8.1)

## 2018-07-15 LAB — CBC WITH DIFFERENTIAL/PLATELET
BASOS ABS: 67 {cells}/uL (ref 0–200)
Basophils Relative: 0.9 %
EOS ABS: 163 {cells}/uL (ref 15–500)
EOS PCT: 2.2 %
HEMATOCRIT: 40.9 % (ref 35.0–45.0)
HEMOGLOBIN: 13.6 g/dL (ref 11.7–15.5)
LYMPHS ABS: 1576 {cells}/uL (ref 850–3900)
MCH: 29 pg (ref 27.0–33.0)
MCHC: 33.3 g/dL (ref 32.0–36.0)
MCV: 87.2 fL (ref 80.0–100.0)
MPV: 10 fL (ref 7.5–12.5)
Monocytes Relative: 7.1 %
NEUTROS ABS: 5069 {cells}/uL (ref 1500–7800)
Neutrophils Relative %: 68.5 %
Platelets: 208 10*3/uL (ref 140–400)
RBC: 4.69 10*6/uL (ref 3.80–5.10)
RDW: 12.2 % (ref 11.0–15.0)
Total Lymphocyte: 21.3 %
WBC: 7.4 10*3/uL (ref 3.8–10.8)
WBCMIX: 525 {cells}/uL (ref 200–950)

## 2018-07-15 LAB — LIPID PANEL
Cholesterol: 111 mg/dL (ref <200)
HDL: 44 mg/dL — AB (ref >50)
LDL Cholesterol (Calc): 48 mg/dL (calc)
Non-HDL Cholesterol (Calc): 67 mg/dL (calc) (ref <130)
Total CHOL/HDL Ratio: 2.5 (calc) (ref <5.0)
Triglycerides: 100 mg/dL (ref <150)

## 2018-07-15 LAB — PROTIME-INR
INR: 1
PROTHROMBIN TIME: 11.1 s (ref 9.0–11.5)

## 2018-07-15 LAB — TSH: TSH: 0.36 m[IU]/L — AB (ref 0.40–4.50)

## 2018-07-15 LAB — AFP TUMOR MARKER: AFP-Tumor Marker: 11.7 ng/mL — ABNORMAL HIGH

## 2018-07-15 LAB — MAGNESIUM: Magnesium: 2.2 mg/dL (ref 1.5–2.5)

## 2018-07-15 MED ORDER — ROSUVASTATIN CALCIUM 10 MG PO TABS: 10.0000 mg | ORAL_TABLET | Freq: Every day | ORAL | 3 refills | Status: DC

## 2018-07-15 NOTE — Addendum Note (Signed)
Addended by: Vicie Mutters R on: 07/15/2018 08:49 AM   Modules accepted: Orders

## 2018-07-23 ENCOUNTER — Ambulatory Visit: Payer: Self-pay | Admitting: Adult Health

## 2018-07-30 ENCOUNTER — Other Ambulatory Visit: Payer: Self-pay | Admitting: Adult Health

## 2018-07-30 ENCOUNTER — Encounter: Payer: Self-pay | Admitting: Adult Health

## 2018-07-30 ENCOUNTER — Ambulatory Visit (INDEPENDENT_AMBULATORY_CARE_PROVIDER_SITE_OTHER): Payer: PPO | Admitting: Adult Health

## 2018-07-30 VITALS — BP 138/80 | HR 87 | Temp 97.5°F | Ht 64.25 in | Wt 171.4 lb

## 2018-07-30 DIAGNOSIS — R34 Anuria and oliguria: Secondary | ICD-10-CM | POA: Diagnosis not present

## 2018-07-30 DIAGNOSIS — M545 Low back pain, unspecified: Secondary | ICD-10-CM

## 2018-07-30 DIAGNOSIS — N183 Chronic kidney disease, stage 3 unspecified: Secondary | ICD-10-CM

## 2018-07-30 MED ORDER — MELOXICAM 15 MG PO TABS: ORAL_TABLET | ORAL | 1 refills | Status: DC

## 2018-07-30 MED ORDER — TIZANIDINE HCL 4 MG PO CAPS: 4.0000 mg | ORAL_CAPSULE | Freq: Three times a day (TID) | ORAL | 0 refills | Status: DC | PRN

## 2018-07-30 NOTE — Patient Instructions (Addendum)
Take mobic daily with food x 2 weeks, then 1/2 - 1 tab as needed Tizanidine (muscle relaxer) 1 tab in evening, 1/2-1 tab during day if needed and tolerated (may make you drowsy)  Apply heat/take hot epsom salt baths for relief in the evenings  Let me know if new symptoms, numbness, tingling, fever/chills  Normal to take ~12 weeks to fully resolve, but should see some improvement within the first two weeks     Meloxicam tablets What is this medicine? MELOXICAM (mel OX i cam) is a non-steroidal anti-inflammatory drug (NSAID). It is used to reduce swelling and to treat pain. It may be used for osteoarthritis, rheumatoid arthritis, or juvenile rheumatoid arthritis. This medicine may be used for other purposes; ask your health care provider or pharmacist if you have questions. COMMON BRAND NAME(S): Mobic What should I tell my health care provider before I take this medicine? They need to know if you have any of these conditions: -bleeding disorders -cigarette smoker -coronary artery bypass graft (CABG) surgery within the past 2 weeks -drink more than 3 alcohol-containing drinks per day -heart disease -high blood pressure -history of stomach bleeding -kidney disease -liver disease -lung or breathing disease, like asthma -stomach or intestine problems -an unusual or allergic reaction to meloxicam, aspirin, other NSAIDs, other medicines, foods, dyes, or preservatives -pregnant or trying to get pregnant -breast-feeding How should I use this medicine? Take this medicine by mouth with a full glass of water. Follow the directions on the prescription label. You can take it with or without food. If it upsets your stomach, take it with food. Take your medicine at regular intervals. Do not take it more often than directed. Do not stop taking except on your doctor's advice. A special MedGuide will be given to you by the pharmacist with each prescription and refill. Be sure to read this information  carefully each time. Talk to your pediatrician regarding the use of this medicine in children. While this drug may be prescribed for selected conditions, precautions do apply. Patients over 56 years old may have a stronger reaction and need a smaller dose. Overdosage: If you think you have taken too much of this medicine contact a poison control center or emergency room at once. NOTE: This medicine is only for you. Do not share this medicine with others. What if I miss a dose? If you miss a dose, take it as soon as you can. If it is almost time for your next dose, take only that dose. Do not take double or extra doses. What may interact with this medicine? Do not take this medicine with any of the following medications: -cidofovir -ketorolac This medicine may also interact with the following medications: -aspirin and aspirin-like medicines -certain medicines for blood pressure, heart disease, irregular heart beat -certain medicines for depression, anxiety, or psychotic disturbances -certain medicines that treat or prevent blood clots like warfarin, enoxaparin, dalteparin, apixaban, dabigatran, rivaroxaban -cyclosporine -digoxin -diuretics -methotrexate -other NSAIDs, medicines for pain and inflammation, like ibuprofen and naproxen -pemetrexed This list may not describe all possible interactions. Give your health care provider a list of all the medicines, herbs, non-prescription drugs, or dietary supplements you use. Also tell them if you smoke, drink alcohol, or use illegal drugs. Some items may interact with your medicine. What should I watch for while using this medicine? Tell your doctor or healthcare professional if your symptoms do not start to get better or if they get worse. Do not take other medicines that contain  aspirin, ibuprofen, or naproxen with this medicine. Side effects such as stomach upset, nausea, or ulcers may be more likely to occur. Many medicines available without a  prescription should not be taken with this medicine. This medicine can cause ulcers and bleeding in the stomach and intestines at any time during treatment. This can happen with no warning and may cause death. There is increased risk with taking this medicine for a long time. Smoking, drinking alcohol, older age, and poor health can also increase risks. Call your doctor right away if you have stomach pain or blood in your vomit or stool. This medicine does not prevent heart attack or stroke. In fact, this medicine may increase the chance of a heart attack or stroke. The chance may increase with longer use of this medicine and in people who have heart disease. If you take aspirin to prevent heart attack or stroke, talk with your doctor or health care professional. What side effects may I notice from receiving this medicine? Side effects that you should report to your doctor or health care professional as soon as possible: -allergic reactions like skin rash, itching or hives, swelling of the face, lips, or tongue -nausea, vomiting -signs and symptoms of a blood clot such as breathing problems; changes in vision; chest pain; severe, sudden headache; pain, swelling, warmth in the leg; trouble speaking; sudden numbness or weakness of the face, arm, or leg -signs and symptoms of bleeding such as bloody or black, tarry stools; red or dark-brown urine; spitting up blood or brown material that looks like coffee grounds; red spots on the skin; unusual bruising or bleeding from the eye, gums, or nose -signs and symptoms of liver injury like dark yellow or brown urine; general ill feeling or flu-like symptoms; light-colored stools; loss of appetite; nausea; right upper belly pain; unusually weak or tired; yellowing of the eyes or skin -signs and symptoms of stroke like changes in vision; confusion; trouble speaking or understanding; severe headaches; sudden numbness or weakness of the face, arm, or leg; trouble  walking; dizziness; loss of balance or coordination Side effects that usually do not require medical attention (report to your doctor or health care professional if they continue or are bothersome): -constipation -diarrhea -gas This list may not describe all possible side effects. Call your doctor for medical advice about side effects. You may report side effects to FDA at 1-800-FDA-1088. Where should I keep my medicine? Keep out of the reach of children. Store at room temperature between 15 and 30 degrees C (59 and 86 degrees F). Throw away any unused medicine after the expiration date. NOTE: This sheet is a summary. It may not cover all possible information. If you have questions about this medicine, talk to your doctor, pharmacist, or health care provider.  2018 Elsevier/Gold Standard (2015-11-01 19:28:16)   Tizanidine tablets or capsules What is this medicine? TIZANIDINE (tye ZAN i deen) helps to relieve muscle spasms. It may be used to help in the treatment of multiple sclerosis and spinal cord injury. This medicine may be used for other purposes; ask your health care provider or pharmacist if you have questions. COMMON BRAND NAME(S): Zanaflex What should I tell my health care provider before I take this medicine? They need to know if you have any of these conditions: -kidney disease -liver disease -low blood pressure -mental disorder -an unusual or allergic reaction to tizanidine, other medicines, lactose (tablets only), foods, dyes, or preservatives -pregnant or trying to get pregnant -breast-feeding How should  I use this medicine? Take this medicine by mouth with a full glass of water. Take this medicine on an empty stomach, at least 30 minutes before or 2 hours after food. Do not take with food unless you talk with your doctor. Follow the directions on the prescription label. Take your medicine at regular intervals. Do not take your medicine more often than directed. Do not stop  taking except on your doctor's advice. Suddenly stopping the medicine can be very dangerous. Talk to your pediatrician regarding the use of this medicine in children. Patients over 90 years old may have a stronger reaction and need a smaller dose. Overdosage: If you think you have taken too much of this medicine contact a poison control center or emergency room at once. NOTE: This medicine is only for you. Do not share this medicine with others. What if I miss a dose? If you miss a dose, take it as soon as you can. If it is almost time for your next dose, take only that dose. Do not take double or extra doses. What may interact with this medicine? Do not take this medicine with any of the following medications: -ciprofloxacin -cisapride -dofetilide -dronedarone -fluvoxamine -narcotic medicines for cough -pimozide -thiabendazole -thioridazine -ziprasidone This medicine may also interact with the following medications: -acyclovir -alcohol -antihistamines for allergy, cough and cold -baclofen -certain antibiotics like levofloxacin, ofloxacin -certain medicines for anxiety or sleep -certain medicines for blood pressure, heart disease, irregular heart beat -certain medicines for depression like amitriptyline, fluoxetine, sertraline -certain medicines for seizures like phenobarbital, primidone -certain medicines for stomach problems like cimetidine, famotidine -female hormones, like estrogens or progestins and birth control pills, patches, rings, or injections -general anesthetics like halothane, isoflurane, methoxyflurane, propofol -local anesthetics like lidocaine, pramoxine, tetracaine -medicines that relax muscles for surgery -narcotic medicines for pain -other medicines that prolong the QT interval (cause an abnormal heart rhythm) -phenothiazines like chlorpromazine, mesoridazine, prochlorperazine -ticlopidine -zileuton This list may not describe all possible interactions. Give  your health care provider a list of all the medicines, herbs, non-prescription drugs, or dietary supplements you use. Also tell them if you smoke, drink alcohol, or use illegal drugs. Some items may interact with your medicine. What should I watch for while using this medicine? Tell your doctor or health care professional if your symptoms do not start to get better or if they get worse. You may get drowsy or dizzy. Do not drive, use machinery, or do anything that needs mental alertness until you know how this medicine affects you. Do not stand or sit up quickly, especially if you are an older patient. This reduces the risk of dizzy or fainting spells. Alcohol may interfere with the effect of this medicine. Avoid alcoholic drinks. If you are taking another medicine that also causes drowsiness, you may have more side effects. Give your health care provider a list of all medicines you use. Your doctor will tell you how much medicine to take. Do not take more medicine than directed. Call emergency for help if you have problems breathing or unusual sleepiness. Your mouth may get dry. Chewing sugarless gum or sucking hard candy, and drinking plenty of water may help. Contact your doctor if the problem does not go away or is severe. What side effects may I notice from receiving this medicine? Side effects that you should report to your doctor or health care professional as soon as possible: -allergic reactions like skin rash, itching or hives, swelling of the face, lips, or  tongue -breathing problems -hallucinations -signs and symptoms of liver injury like dark yellow or brown urine; general ill feeling or flu-like symptoms; light-colored stools; loss of appetite; nausea; right upper quadrant belly pain; unusually weak or tired; yellowing of the eyes or skin -signs and symptoms of low blood pressure like dizziness; feeling faint or lightheaded, falls; unusually weak or tired -unusually slow  heartbeat -unusually weak or tired Side effects that usually do not require medical attention (report to your doctor or health care professional if they continue or are bothersome): -blurred vision -constipation -dizziness -dry mouth -tiredness This list may not describe all possible side effects. Call your doctor for medical advice about side effects. You may report side effects to FDA at 1-800-FDA-1088. Where should I keep my medicine? Keep out of the reach of children. Store at room temperature between 15 and 30 degrees C (59 and 86 degrees F). Throw away any unused medicine after the expiration date. NOTE: This sheet is a summary. It may not cover all possible information. If you have questions about this medicine, talk to your doctor, pharmacist, or health care provider.  2018 Elsevier/Gold Standard (2015-07-11 13:52:12)    Low Back Strain Rehab Ask your health care provider which exercises are safe for you. Do exercises exactly as told by your health care provider and adjust them as directed. It is normal to feel mild stretching, pulling, tightness, or discomfort as you do these exercises, but you should stop right away if you feel sudden pain or your pain gets worse. Do not begin these exercises until told by your health care provider. Stretching and range of motion exercises These exercises warm up your muscles and joints and improve the movement and flexibility of your back. These exercises also help to relieve pain, numbness, and tingling. Exercise A: Single knee to chest  1. Lie on your back on a firm surface with both legs straight. 2. Bend one of your knees. Use your hands to move your knee up toward your chest until you feel a gentle stretch in your lower back and buttock. ? Hold your leg in this position by holding onto the front of your knee. ? Keep your other leg as straight as possible. 3. Hold for __________ seconds. 4. Slowly return to the starting  position. 5. Repeat with your other leg. Repeat __________ times. Complete this exercise __________ times a day. Exercise B: Prone extension on elbows  1. Lie on your abdomen on a firm surface. 2. Prop yourself up on your elbows. 3. Use your arms to help lift your chest up until you feel a gentle stretch in your abdomen and your lower back. ? This will place some of your body weight on your elbows. If this is uncomfortable, try stacking pillows under your chest. ? Your hips should stay down, against the surface that you are lying on. Keep your hip and back muscles relaxed. 4. Hold for __________ seconds. 5. Slowly relax your upper body and return to the starting position. Repeat __________ times. Complete this exercise __________ times a day. Strengthening exercises These exercises build strength and endurance in your back. Endurance is the ability to use your muscles for a long time, even after they get tired. Exercise C: Pelvic tilt 1. Lie on your back on a firm surface. Bend your knees and keep your feet flat. 2. Tense your abdominal muscles. Tip your pelvis up toward the ceiling and flatten your lower back into the floor. ? To help with this  exercise, you may place a small towel under your lower back and try to push your back into the towel. 3. Hold for __________ seconds. 4. Let your muscles relax completely before you repeat this exercise. Repeat __________ times. Complete this exercise __________ times a day. Exercise D: Alternating arm and leg raises  1. Get on your hands and knees on a firm surface. If you are on a hard floor, you may want to use padding to cushion your knees, such as an exercise mat. 2. Line up your arms and legs. Your hands should be below your shoulders, and your knees should be below your hips. 3. Lift your left leg behind you. At the same time, raise your right arm and straighten it in front of you. ? Do not lift your leg higher than your hip. ? Do not lift  your arm higher than your shoulder. ? Keep your abdominal and back muscles tight. ? Keep your hips facing the ground. ? Do not arch your back. ? Keep your balance carefully, and do not hold your breath. 4. Hold for __________ seconds. 5. Slowly return to the starting position and repeat with your right leg and your left arm. Repeat __________ times. Complete this exercise __________times a day. Exercise J: Single leg lower with bent knees 1. Lie on your back on a firm surface. 2. Tense your abdominal muscles and lift your feet off the floor, one foot at a time, so your knees and hips are bent in an "L" shape (at about 90 degrees). ? Your knees should be over your hips and your lower legs should be parallel to the floor. 3. Keeping your abdominal muscles tense and your knee bent, slowly lower one of your legs so your toe touches the ground. 4. Lift your leg back up to return to the starting position. ? Do not hold your breath. ? Do not let your back arch. Keep your back flat against the ground. 5. Repeat with your other leg. Repeat __________ times. Complete this exercise __________ times a day. Posture and body mechanics  Body mechanics refers to the movements and positions of your body while you do your daily activities. Posture is part of body mechanics. Good posture and healthy body mechanics can help to relieve stress in your body's tissues and joints. Good posture means that your spine is in its natural S-curve position (your spine is neutral), your shoulders are pulled back slightly, and your head is not tipped forward. The following are general guidelines for applying improved posture and body mechanics to your everyday activities. Standing   When standing, keep your spine neutral and your feet about hip-width apart. Keep a slight bend in your knees. Your ears, shoulders, and hips should line up.  When you do a task in which you stand in one place for a long time, place one foot up  on a stable object that is 2-4 inches (5-10 cm) high, such as a footstool. This helps keep your spine neutral. Sitting   When sitting, keep your spine neutral and keep your feet flat on the floor. Use a footrest, if necessary, and keep your thighs parallel to the floor. Avoid rounding your shoulders, and avoid tilting your head forward.  When working at a desk or a computer, keep your desk at a height where your hands are slightly lower than your elbows. Slide your chair under your desk so you are close enough to maintain good posture.  When working at a computer,  place your monitor at a height where you are looking straight ahead and you do not have to tilt your head forward or downward to look at the screen. Resting   When lying down and resting, avoid positions that are most painful for you.  If you have pain with activities such as sitting, bending, stooping, or squatting (flexion-based activities), lie in a position in which your body does not bend very much. For example, avoid curling up on your side with your arms and knees near your chest (fetal position).  If you have pain with activities such as standing for a long time or reaching with your arms (extension-based activities), lie with your spine in a neutral position and bend your knees slightly. Try the following positions: ? Lying on your side with a pillow between your knees. ? Lying on your back with a pillow under your knees. Lifting   When lifting objects, keep your feet at least shoulder-width apart and tighten your abdominal muscles.  Bend your knees and hips and keep your spine neutral. It is important to lift using the strength of your legs, not your back. Do not lock your knees straight out.  Always ask for help to lift heavy or awkward objects. This information is not intended to replace advice given to you by your health care provider. Make sure you discuss any questions you have with your health care  provider. Document Released: 09/30/2005 Document Revised: 06/06/2016 Document Reviewed: 07/12/2015 Elsevier Interactive Patient Education  [employer].

## 2018-07-30 NOTE — Progress Notes (Signed)
Assessment and Plan:  Rebekah Pace was seen today for back pain.  Diagnoses and all orders for this visit:  Acute right-sided low back pain without sciatica Suspect simple muscle spasm/mild strain, neg straight leg raise Prednisone was not prescribed, NSAIDs, RICE, and exercise given Natural history/typical course discussed If not better follow up in office or will refer to PT/orthopedics. -     meloxicam (MOBIC) 15 MG tablet; Take one daily with food for 2 weeks, can take with tylenol, can not take with aleve, iburpofen, then as needed daily for pain -     tiZANidine (ZANAFLEX) 4 MG capsule; Take 1 capsule (4 mg total) by mouth 3 (three) times daily as needed for muscle spasms.  Decreased urine output/ CKD (chronic kidney disease) stage 3, GFR 30-59 ml/min (HCC) Patient is very anxious regarding possible renal involvement with back pain despite education and reassurance. Will check renal functions and UA.  -     BASIC METABOLIC PANEL WITH GFR -     Urinalysis w microscopic + reflex cultur  Further disposition pending results of labs. Discussed med's effects and SE's.   Over 30 minutes of exam, counseling, chart review, and critical decision making was performed.   Future Appointments  Date Time Provider [place]  07/31/2018  8:00 AM [facility] [place] [facility]  10/22/2018  2:00 PM Unk Pinto, MD [facility] None    ------------------------------------------------------------------------------------------------------------------   HPI BP 138/80   Pulse 87   Temp (!) 97.5 F (36.4 C)   Ht 5' 4.25" (1.632 m)   Wt 171 lb 6.4 oz (77.7 kg)   SpO2 97%   BMI 29.19 kg/m   62 y.o.female with hx of depression/anxiety presents for 2 days of lower back pain, bilateral but a bit worse on the right mainly dull with intermittent ache, 4/10, non-radiating, worse with leaning forward; denies history of previous back pain or recent injury. Uncomfortable when attempting to sleep at  night, or sitting for extended periods. Denies numbness, tingling, weakness of lower extremities, severe midline pain.   She has CKD 3 and is concerned about renal functions, concerned that urine output is lower, urine is darker yellow than usual and somewhat frothy. Denies dysuria, urine odor, fever/chills.   Past Medical History:  Diagnosis Date  . Anxiety   . Depression   . Gout 05/17/2016  . Hepatitis   . Hypothyroidism   . Labile hypertension   . Mental disorder   . Prediabetes   . Thyroid disease      Allergies  Allergen Reactions  . Sulfa Antibiotics Rash    Current Outpatient Medications on File Prior to Visit  Medication Sig  . ARIPiprazole (ABILIFY) 10 MG tablet Take 10 mg by mouth daily.  Marland Kitchen levothyroxine (SYNTHROID, LEVOTHROID) 75 MCG tablet TAKE 1 TABLET BY MOUTH EVERY DAY  . MAGNESIUM PO Take by mouth.  Marland Kitchen OVER THE COUNTER MEDICATION   . rosuvastatin (CRESTOR) 10 MG tablet Take 1 tablet (10 mg total) by mouth at bedtime.  . traZODone (DESYREL) 50 MG tablet 1/2-1 tablet for sleep  . VITAMIN D, CHOLECALCIFEROL, PO Take 5,000 Units by mouth 2 (two) times daily.    No current facility-administered medications on file prior to visit.     ROS: all negative except above.   Physical Exam:  BP 138/80   Pulse 87   Temp (!) 97.5 F (36.4 C)   Ht 5' 4.25" (1.632 m)   Wt 171 lb 6.4 oz (77.7 kg)   SpO2  97%   BMI 29.19 kg/m   General Appearance: Well nourished, in no apparent distress. Eyes: conjunctiva no swelling or erythema ENT/Mouth: Hearing normal.  Neck: Supple, thyroid normal.  Respiratory: Respiratory effort normal, BS equal bilaterally without rales, rhonchi, wheezing or stridor.  Cardio: RRR with no MRGs. Brisk peripheral pulses without edema.  Abdomen: Soft, + BS.  Non tender, no guarding, rebound, hernias, masses. Lymphatics: Non tender without lymphadenopathy.  Musculoskeletal: Patient is able to ambulate well. Gait is not  Antalgic. Straight leg  raising with dorsiflexion negative bilaterally for radicular symptoms. Sensory exam in the legs are normal. Knee reflexes are normal Ankle reflexes are normal Strength is normal and symmetric in arms and legs. There is not SI tenderness to palpation.  There is paraspinal muscle spasm.  There is not midline tenderness.  ROM of spine is intact in all spheres.  Skin: Warm, dry without rashes, lesions, ecchymosis.  Neuro: Cranial nerves intact. Normal muscle tone, no cerebellar symptoms. Sensation intact.  Psych: Awake and oriented X 3, normal affect, Insight and Judgment appropriate.    Izora Ribas, NP 11:08 AM [facility]

## 2018-07-31 ENCOUNTER — Encounter: Payer: Self-pay | Admitting: Adult Health

## 2018-07-31 ENCOUNTER — Ambulatory Visit
Admission: RE | Admit: 2018-07-31 | Discharge: 2018-07-31 | Disposition: A | Discharge status: Home / Self Care | Payer: PPO | Source: Ambulatory Visit | Attending: Nurse Practitioner | Admitting: Nurse Practitioner

## 2018-07-31 DIAGNOSIS — K7400 Hepatic fibrosis, unspecified: Secondary | ICD-10-CM

## 2018-07-31 DIAGNOSIS — R3129 Other microscopic hematuria: Secondary | ICD-10-CM | POA: Insufficient documentation

## 2018-08-01 LAB — BASIC METABOLIC PANEL WITH GFR
BUN/Creatinine Ratio: 13 (calc) (ref 6–22)
BUN: 13 mg/dL (ref 7–25)
CALCIUM: 9.6 mg/dL (ref 8.6–10.4)
CO2: 27 mmol/L (ref 20–32)
CREATININE: 1.02 mg/dL — AB (ref 0.50–0.99)
Chloride: 103 mmol/L (ref 98–110)
GFR, EST AFRICAN AMERICAN: 68 mL/min/{1.73_m2} (ref >=60)
GFR, EST NON AFRICAN AMERICAN: 58 mL/min/{1.73_m2} — AB (ref >=60)
Glucose, Bld: 89 mg/dL (ref 65–99)
Potassium: 3.9 mmol/L (ref 3.5–5.3)
Sodium: 137 mmol/L (ref 135–146)

## 2018-08-01 LAB — CULTURE INDICATED

## 2018-08-01 LAB — URINALYSIS W MICROSCOPIC + REFLEX CULTURE
Bacteria, UA: NONE SEEN /HPF
Bilirubin Urine: NEGATIVE
GLUCOSE, UA: NEGATIVE
Hyaline Cast: NONE SEEN /LPF
KETONES UR: NEGATIVE
Nitrites, Initial: NEGATIVE
PH: 6 (ref 5.0–8.0)
Protein, ur: NEGATIVE
RBC / HPF: NONE SEEN /HPF (ref 0–2)
Specific Gravity, Urine: 1.007 (ref 1.001–1.03)
Squamous Epithelial / LPF: NONE SEEN /HPF (ref <=5)
WBC, UA: NONE SEEN /HPF (ref 0–5)

## 2018-08-01 LAB — URINE CULTURE
MICRO NUMBER:: 91255068
RESULT: NO GROWTH
SAMPLE SOURCE: 0
SPECIMEN QUALITY:: ADEQUATE

## 2018-10-22 ENCOUNTER — Ambulatory Visit (INDEPENDENT_AMBULATORY_CARE_PROVIDER_SITE_OTHER): Payer: PPO | Admitting: Internal Medicine

## 2018-10-22 ENCOUNTER — Encounter: Payer: Self-pay | Admitting: Internal Medicine

## 2018-10-22 VITALS — BP 116/84 | HR 72 | Temp 97.7°F | Resp 16 | Ht 64.5 in | Wt 179.2 lb

## 2018-10-22 DIAGNOSIS — Z0000 Encounter for general adult medical examination without abnormal findings: Secondary | ICD-10-CM | POA: Diagnosis not present

## 2018-10-22 DIAGNOSIS — Z1212 Encounter for screening for malignant neoplasm of rectum: Secondary | ICD-10-CM

## 2018-10-22 DIAGNOSIS — N183 Chronic kidney disease, stage 3 unspecified: Secondary | ICD-10-CM

## 2018-10-22 DIAGNOSIS — R0989 Other specified symptoms and signs involving the circulatory and respiratory systems: Secondary | ICD-10-CM

## 2018-10-22 DIAGNOSIS — K760 Fatty (change of) liver, not elsewhere classified: Secondary | ICD-10-CM | POA: Diagnosis not present

## 2018-10-22 DIAGNOSIS — E782 Mixed hyperlipidemia: Secondary | ICD-10-CM

## 2018-10-22 DIAGNOSIS — Z0001 Encounter for general adult medical examination with abnormal findings: Secondary | ICD-10-CM

## 2018-10-22 DIAGNOSIS — M1000 Idiopathic gout, unspecified site: Secondary | ICD-10-CM

## 2018-10-22 DIAGNOSIS — Z79899 Other long term (current) drug therapy: Secondary | ICD-10-CM

## 2018-10-22 DIAGNOSIS — E039 Hypothyroidism, unspecified: Secondary | ICD-10-CM

## 2018-10-22 DIAGNOSIS — Z8249 Family history of ischemic heart disease and other diseases of the circulatory system: Secondary | ICD-10-CM

## 2018-10-22 DIAGNOSIS — R7309 Other abnormal glucose: Secondary | ICD-10-CM

## 2018-10-22 DIAGNOSIS — Z136 Encounter for screening for cardiovascular disorders: Secondary | ICD-10-CM

## 2018-10-22 DIAGNOSIS — Z1211 Encounter for screening for malignant neoplasm of colon: Secondary | ICD-10-CM

## 2018-10-22 DIAGNOSIS — E559 Vitamin D deficiency, unspecified: Secondary | ICD-10-CM

## 2018-10-22 DIAGNOSIS — E8881 Metabolic syndrome: Secondary | ICD-10-CM

## 2018-10-22 NOTE — Progress Notes (Signed)
[facility] Unk Pinto, M.D.     Uvaldo Bristle. Silverio Lay, P.A.-C Liane Comber, [place] [address] [place], [place] [address] Telephone [phone] Telefax [phone] Annual Screening/Preventative Visit & Comprehensive Evaluation &  Examination     This very nice 62 y.o. DWF presents for a Screening /Preventative Visit & comprehensive evaluation and management of multiple medical co-morbidities.  Patient has been followed for HTN, HLD, Prediabetes  and Vitamin D Deficiency.     Patient is on SS Disability for hx/o Bipolar Manic Depressive Illness w/ hx/o Psychosis and is stable on only Abilify. She also has hx/ Treated Hepatis C w/ virologic cure and followed at the [facility] by Dr Zollie Scale and Roosevelt Locks, NP (08/17/2018). She continues f/u q6 months.     Patient has hx/o labile  HTN followed expectantly circa 2005. Patient's BP has been controlled at home and patient denies any cardiac symptoms as chest pain, palpitations, shortness of breath, dizziness or ankle swelling. Today's BP is at goal - 116/84.      Patient's hyperlipidemia is controlled with diet and Crestor. Patient denies myalgias or other medication SE's. Last lipids were at goal: Lab Results  Component Value Date   CHOL 111 07/14/2018   HDL 44 (L) 07/14/2018   LDLCALC 48 07/14/2018   TRIG 100 07/14/2018   CHOLHDL 2.5 07/14/2018      Patient has Moderate Obesity (BMI 30+) and hx/o prediabetes / Insulin Resistance  and patient denies reactive hypoglycemic symptoms, visual blurring, diabetic polys or paresthesias. Last A1c was  Lab Results  Component Value Date   HGBA1C 5.0 04/09/2018      Patient has been on Thyroid Replacement since 2002.      Finally, patient has history of Vitamin D Deficiency(18" / 2017 and "29" / 2015)  and last Vitamin D was near goal (70-100): Lab Results  Component Value Date   VD25OH 54 04/09/2018    Current Outpatient Medications on File Prior to Visit  Medication Sig  . ABILIFY 10 MG  Take 10 mg by mouth daily.  . Levothyroxine 75 MCG  TAKE 1 TABLET BY MOUTH EVERY DAY  . MAGNESIUM  Take by mouth.  . CRESTOR 10 MG  Take 1 tablet (10 mg total) by mouth at bedtime.  . traZODone  50 MG 1/2-1 tablet for sleep  . VITAMIN D Take 5,000 Units by mouth 2 (two) times daily.    Allergies  Allergen Reactions  . Sulfa Antibiotics Rash   Past Medical History:  Diagnosis Date  . Anxiety   . Depression   . Gout 05/17/2016  . Hepatitis   . Hypothyroidism   . Labile hypertension   . Mental disorder   . Prediabetes   . Thyroid disease    Health Maintenance  Topic Date Due  . INFLUENZA VACCINE  05/14/2018  . PAP SMEAR-Modifier  07/18/2018  . TETANUS/TDAP  01/08/2019 (Originally 10/14/2005)  . MAMMOGRAM  11/11/2018  . Fecal DNA (Cologuard)  06/24/2020  . Hepatitis C Screening  Completed  . HIV Screening  Completed   Immunization History  Administered Date(s) Administered  . Pneumococcal-Unspecified 10/14/2001  . Td 10/15/1995   Last Colon -  Last MGM -  Past Surgical History:  Procedure Laterality Date  . arm fracture    . ROTATOR CUFF REPAIR     Family History  Problem Relation Age of Onset  . Hyperlipidemia Mother   . Hypertension Mother   .  Colon polyps Mother   . Cancer Father        Lung  . Hyperlipidemia Brother    Social History   Tobacco Use  . Smoking status: Never Smoker  . Smokeless tobacco: Never Used  Substance Use Topics  . Alcohol use: No  . Drug use: No    ROS Constitutional: Denies fever, chills, weight loss/gain, headaches, insomnia,  night sweats, and change in appetite. Does c/o fatigue. Eyes: Denies redness, blurred vision, diplopia, discharge, itchy, watery eyes.  ENT: Denies discharge, congestion, post nasal drip, epistaxis, sore throat, earache, hearing loss, dental pain, Tinnitus, Vertigo, Sinus pain, snoring.  Cardio: Denies chest pain,  palpitations, irregular heartbeat, syncope, dyspnea, diaphoresis, orthopnea, PND, claudication, edema Respiratory: denies cough, dyspnea, DOE, pleurisy, hoarseness, laryngitis, wheezing.  Gastrointestinal: Denies dysphagia, heartburn, reflux, water brash, pain, cramps, nausea, vomiting, bloating, diarrhea, constipation, hematemesis, melena, hematochezia, jaundice, hemorrhoids Genitourinary: Denies dysuria, frequency, urgency, nocturia, hesitancy, discharge, hematuria, flank pain Breast: Breast lumps, nipple discharge, bleeding.  Musculoskeletal: Denies arthralgia, myalgia, stiffness, Jt. Swelling, pain, limp, and strain/sprain. Denies falls. Skin: Denies puritis, rash, hives, warts, acne, eczema, changing in skin lesion Neuro: No weakness, tremor, incoordination, spasms, paresthesia, pain Psychiatric: Denies confusion, memory loss, sensory loss. Denies Depression. Endocrine: Denies change in weight, skin, hair change, nocturia, and paresthesia, diabetic polys, visual blurring, hyper / hypo glycemic episodes.  Heme/Lymph: No excessive bleeding, bruising, enlarged lymph nodes.  Physical Exam  BP 116/84   Pulse 72   Temp 97.7 F (36.5 C)   Resp 16   Ht 5' 4.5" (1.638 m)   Wt 179 lb 3.2 oz (81.3 kg)   BMI 30.28 kg/m   General Appearance: Over nourished, well groomed and in no apparent distress.  Eyes: PERRLA, EOMs, conjunctiva no swelling or erythema, normal fundi and vessels. Sinuses: No frontal/maxillary tenderness ENT/Mouth: EACs patent / TMs  nl. Nares clear without erythema, swelling, mucoid exudates. Oral hygiene is good. No erythema, swelling, or exudate. Tongue normal, non-obstructing. Tonsils not swollen or erythematous. Hearing normal.  Neck: Supple, thyroid not palpable. No bruits, nodes or JVD. Respiratory: Respiratory effort normal.  BS equal and clear bilateral without rales, rhonci, wheezing or stridor. Cardio: Heart sounds are normal with regular rate and rhythm and no  murmurs, rubs or gallops. Peripheral pulses are normal and equal bilaterally without edema. No aortic or femoral bruits. Chest: symmetric with normal excursions and percussion. Breasts: Symmetric, without lumps, nipple discharge, retractions, or fibrocystic changes.  Abdomen: Flat, soft with bowel sounds active. Nontender, no guarding, rebound, hernias, masses, or organomegaly.  Lymphatics: Non tender without lymphadenopathy.  Musculoskeletal: Full ROM all peripheral extremities, joint stability, 5/5 strength, and normal gait. Skin: Warm and dry without rashes, lesions, cyanosis, clubbing or  ecchymosis.  Neuro: Cranial nerves intact, reflexes equal bilaterally. Normal muscle tone, no cerebellar symptoms. Sensation intact.  Pysch: Alert and oriented X 3, normal affect, Insight and Judgment appropriate.   Assessment and Plan  1. Annual Preventative Screening Examination  2. Labile hypertension  - EKG 12-Lead - Urinalysis, Routine w reflex microscopic - Microalbumin / creatinine urine ratio - CBC with Differential/Platelet - COMPLETE METABOLIC PANEL WITH GFR - Magnesium - TSH  3. Hyperlipidemia, mixed  - EKG 12-Lead - Lipid panel - TSH  4. Abnormal glucose  - EKG 12-Lead - Hemoglobin A1c - Insulin, random  5. Vitamin D deficiency  - VITAMIN D 25 Hydroxyl  6. Hypothyroidism  - TSH  7. Insulin resistance  - EKG 12-Lead - Hemoglobin A1c -  Insulin, random  8. Idiopathic gout  - Uric acid  9. CKD (chronic kidney disease) stage 3, GFR 30-59 ml/min (HCC)  - Urinalysis, Routine w reflex microscopic - Microalbumin / creatinine urine ratio - COMPLETE METABOLIC PANEL WITH GFR  10. NAFLD (nonalcoholic fatty liver disease)  - COMPLETE METABOLIC PANEL WITH GFR  11. Screening for ischemic heart disease  - EKG 12-Lead  12. FH: hypertension  - EKG 12-Lead  13. Screening for colorectal cancer  - POC Hemoccult Bld/Stl  14. Medication management  - Urinalysis,  Routine w reflex microscopic - Microalbumin / creatinine urine ratio - CBC with Differential/Platelet - COMPLETE METABOLIC PANEL WITH GFR - Magnesium - Lipid panel - TSH - Hemoglobin A1c - Insulin, random - VITAMIN D 25 Hydroxy      Patient was counseled in prudent diet to achieve/maintain BMI less than 25 for weight control, BP monitoring, regular exercise and medications. Discussed med's effects and SE's. Screening labs and tests as requested with regular follow-up as recommended. Over 40 minutes of exam, counseling, chart review and high complex critical decision making was performed.

## 2018-10-22 NOTE — Patient Instructions (Addendum)

## 2018-10-23 ENCOUNTER — Other Ambulatory Visit: Payer: Self-pay | Admitting: Internal Medicine

## 2018-10-23 DIAGNOSIS — N3000 Acute cystitis without hematuria: Secondary | ICD-10-CM

## 2018-10-23 LAB — CBC WITH DIFFERENTIAL/PLATELET
Absolute Monocytes: 504 cells/uL (ref 200–950)
Basophils Absolute: 62 cells/uL (ref 0–200)
Basophils Relative: 0.9 %
EOS ABS: 138 {cells}/uL (ref 15–500)
Eosinophils Relative: 2 %
HCT: 40 % (ref 35.0–45.0)
Hemoglobin: 13.5 g/dL (ref 11.7–15.5)
Lymphs Abs: 2187 cells/uL (ref 850–3900)
MCH: 29.2 pg (ref 27.0–33.0)
MCHC: 33.8 g/dL (ref 32.0–36.0)
MCV: 86.4 fL (ref 80.0–100.0)
MONOS PCT: 7.3 %
MPV: 10 fL (ref 7.5–12.5)
Neutro Abs: 4009 cells/uL (ref 1500–7800)
Neutrophils Relative %: 58.1 %
Platelets: 196 10*3/uL (ref 140–400)
RBC: 4.63 10*6/uL (ref 3.80–5.10)
RDW: 12.6 % (ref 11.0–15.0)
TOTAL LYMPHOCYTE: 31.7 %
WBC: 6.9 10*3/uL (ref 3.8–10.8)

## 2018-10-23 LAB — INSULIN, RANDOM: Insulin: 12.1 u[IU]/mL (ref 2.0–19.6)

## 2018-10-23 LAB — COMPLETE METABOLIC PANEL WITH GFR
AG RATIO: 1.7 (calc) (ref 1.0–2.5)
ALBUMIN MSPROF: 4.6 g/dL (ref 3.6–5.1)
ALT: 19 U/L (ref 6–29)
AST: 20 U/L (ref 10–35)
Alkaline phosphatase (APISO): 78 U/L (ref 33–130)
BILIRUBIN TOTAL: 0.8 mg/dL (ref 0.2–1.2)
BUN / CREAT RATIO: 10 (calc) (ref 6–22)
BUN: 11 mg/dL (ref 7–25)
CALCIUM: 10 mg/dL (ref 8.6–10.4)
CHLORIDE: 103 mmol/L (ref 98–110)
CO2: 29 mmol/L (ref 20–32)
Creat: 1.07 mg/dL — ABNORMAL HIGH (ref 0.50–0.99)
GFR, EST AFRICAN AMERICAN: 64 mL/min/{1.73_m2} (ref >=60)
GFR, EST NON AFRICAN AMERICAN: 55 mL/min/{1.73_m2} — AB (ref >=60)
GLOBULIN: 2.7 g/dL (ref 1.9–3.7)
Glucose, Bld: 87 mg/dL (ref 65–99)
POTASSIUM: 4.3 mmol/L (ref 3.5–5.3)
SODIUM: 140 mmol/L (ref 135–146)
Total Protein: 7.3 g/dL (ref 6.1–8.1)

## 2018-10-23 LAB — URINALYSIS, ROUTINE W REFLEX MICROSCOPIC
BACTERIA UA: NONE SEEN /HPF
Bilirubin Urine: NEGATIVE
GLUCOSE, UA: NEGATIVE
HYALINE CAST: NONE SEEN /LPF
KETONES UR: NEGATIVE
Nitrite: NEGATIVE
Protein, ur: NEGATIVE
RBC / HPF: NONE SEEN /HPF (ref 0–2)
SPECIFIC GRAVITY, URINE: 1.004 (ref 1.001–1.03)
Squamous Epithelial / LPF: NONE SEEN /HPF (ref <=5)
pH: 6.5 (ref 5.0–8.0)

## 2018-10-23 LAB — LIPID PANEL
CHOL/HDL RATIO: 2.6 (calc) (ref <5.0)
Cholesterol: 133 mg/dL (ref <200)
HDL: 52 mg/dL (ref >50)
LDL Cholesterol (Calc): 62 mg/dL (calc)
NON-HDL CHOLESTEROL (CALC): 81 mg/dL (ref <130)
Triglycerides: 100 mg/dL (ref <150)

## 2018-10-23 LAB — VITAMIN D 25 HYDROXY (VIT D DEFICIENCY, FRACTURES): Vit D, 25-Hydroxy: 61 ng/mL (ref 30–100)

## 2018-10-23 LAB — TSH: TSH: 1.41 m[IU]/L (ref 0.40–4.50)

## 2018-10-23 LAB — URIC ACID: Uric Acid, Serum: 5.7 mg/dL (ref 2.5–7.0)

## 2018-10-23 LAB — HEMOGLOBIN A1C
HEMOGLOBIN A1C: 5.2 %{Hb} (ref <5.7)
MEAN PLASMA GLUCOSE: 103 (calc)
eAG (mmol/L): 5.7 (calc)

## 2018-10-23 LAB — MICROALBUMIN / CREATININE URINE RATIO
Creatinine, Urine: 30 mg/dL (ref 20–275)
MICROALB UR: 0.4 mg/dL
MICROALB/CREAT RATIO: 13 ug/mg{creat} (ref <30)

## 2018-10-23 LAB — MAGNESIUM: Magnesium: 2.1 mg/dL (ref 1.5–2.5)

## 2018-10-25 ENCOUNTER — Encounter: Payer: Self-pay | Admitting: Internal Medicine

## 2018-10-27 ENCOUNTER — Ambulatory Visit (INDEPENDENT_AMBULATORY_CARE_PROVIDER_SITE_OTHER): Payer: PPO

## 2018-10-27 DIAGNOSIS — Z79899 Other long term (current) drug therapy: Secondary | ICD-10-CM | POA: Diagnosis not present

## 2018-10-27 NOTE — Progress Notes (Signed)
Patient presents to the office for a nurse visit to leave a sample for a urine culture. Patient states that the only symptom she has is urine frequency, no other signs or symptoms.  Vitals taken and recorded.

## 2018-10-27 NOTE — Addendum Note (Signed)
Addended by: Chancy Hurter on: 10/27/2018 02:28 PM   Modules accepted: Orders

## 2018-10-28 LAB — URINE CULTURE
MICRO NUMBER:: 54968
SPECIMEN QUALITY:: ADEQUATE

## 2018-10-29 ENCOUNTER — Other Ambulatory Visit: Payer: Self-pay | Admitting: Internal Medicine

## 2018-10-29 DIAGNOSIS — N3000 Acute cystitis without hematuria: Secondary | ICD-10-CM

## 2018-10-29 MED ORDER — AMOXICILLIN 250 MG PO CAPS: ORAL_CAPSULE | ORAL | 0 refills | Status: DC

## 2018-12-01 ENCOUNTER — Other Ambulatory Visit: Payer: Self-pay

## 2018-12-01 DIAGNOSIS — Z1211 Encounter for screening for malignant neoplasm of colon: Secondary | ICD-10-CM

## 2018-12-01 DIAGNOSIS — Z1212 Encounter for screening for malignant neoplasm of rectum: Principal | ICD-10-CM

## 2018-12-01 LAB — POC HEMOCCULT BLD/STL (HOME/3-CARD/SCREEN)
Card #2 Fecal Occult Blod, POC: NEGATIVE
Card #3 Fecal Occult Blood, POC: NEGATIVE
Fecal Occult Blood, POC: NEGATIVE

## 2018-12-02 ENCOUNTER — Ambulatory Visit (INDEPENDENT_AMBULATORY_CARE_PROVIDER_SITE_OTHER): Payer: PPO

## 2018-12-02 ENCOUNTER — Ambulatory Visit: Payer: Self-pay

## 2018-12-02 DIAGNOSIS — Z1211 Encounter for screening for malignant neoplasm of colon: Secondary | ICD-10-CM | POA: Diagnosis not present

## 2018-12-02 DIAGNOSIS — N3000 Acute cystitis without hematuria: Secondary | ICD-10-CM

## 2018-12-02 NOTE — Progress Notes (Signed)
Patient presents to the office for a nurse visit to leave a urine specimen to recheck  Urinalysis and Culture. Patient states that she is still having urinary frequency but no other symptoms. Antibiotics completed. Vitals taken and recorded.

## 2018-12-03 LAB — URINALYSIS, ROUTINE W REFLEX MICROSCOPIC
BACTERIA UA: NONE SEEN /HPF
Bilirubin Urine: NEGATIVE
Glucose, UA: NEGATIVE
Hyaline Cast: NONE SEEN /LPF
Ketones, ur: NEGATIVE
Nitrite: NEGATIVE
PROTEIN: NEGATIVE
Specific Gravity, Urine: 1.005 (ref 1.001–1.03)
Squamous Epithelial / HPF: NONE SEEN /HPF (ref <=5)
pH: 7 (ref 5.0–8.0)

## 2018-12-03 LAB — URINE CULTURE
MICRO NUMBER:: 216520
SPECIMEN QUALITY:: ADEQUATE

## 2018-12-11 IMAGING — US US ABDOMEN LIMITED
1 series · 14 of 25 positions shown · non-contrast
Comparison: 01/26/2018

CLINICAL DATA: Hepatic fibrosis due to hepatitis C.

EXAM:
ULTRASOUND ABDOMEN LIMITED RIGHT UPPER QUADRANT

[Series 1: us abdomen limited · 0.17mm/px · 14 of 41 slices shown]
[im 1/41]
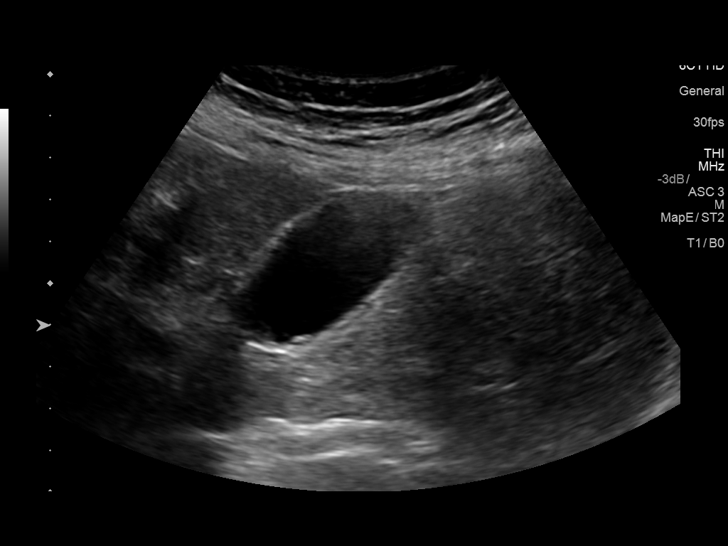
[im 4/41]
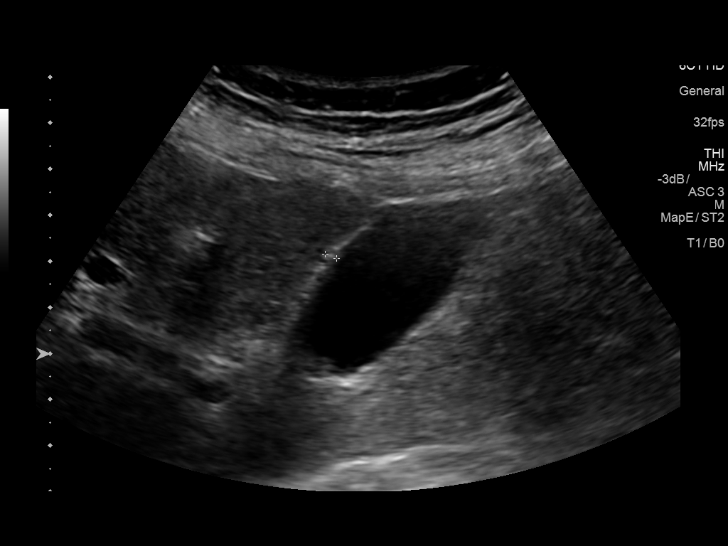
[im 7/41]
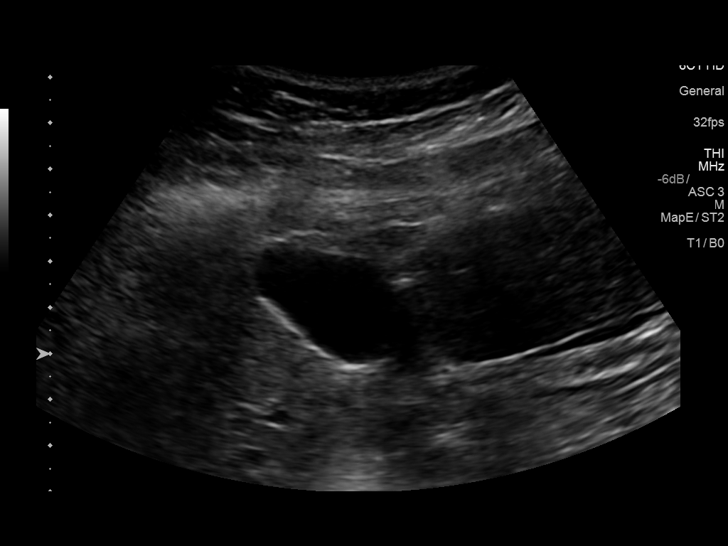
[im 11/41]
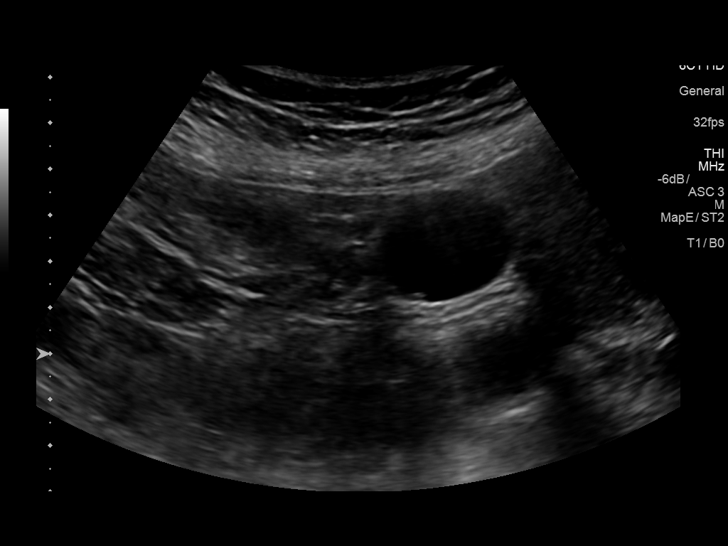
[im 14/41]
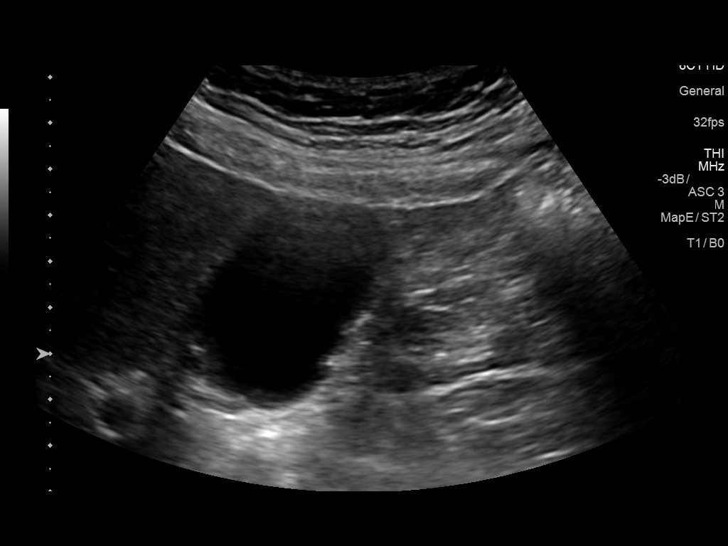
[im 16/41]
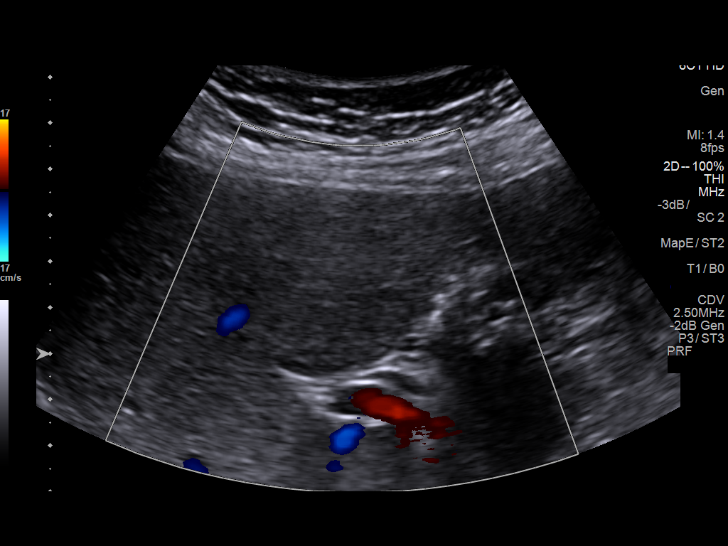
[im 19/41]
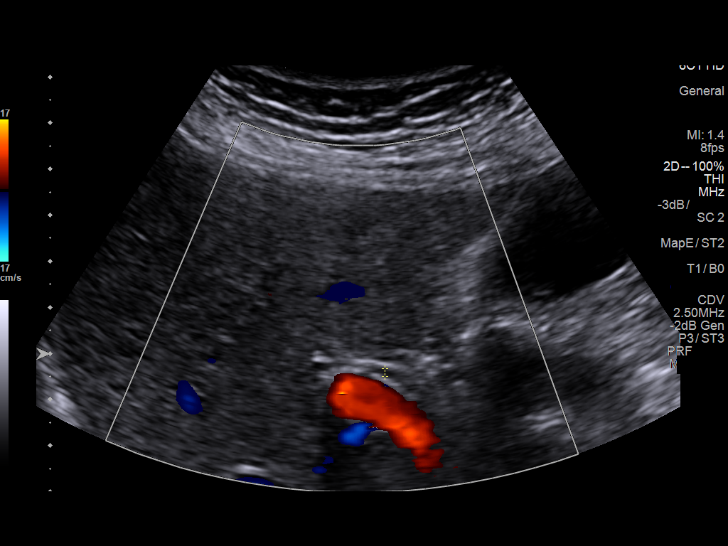
[im 22/41]
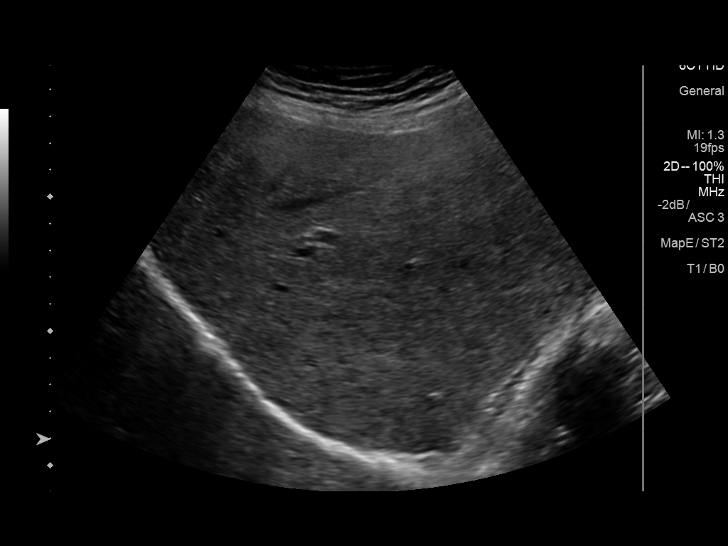
[im 26/41]
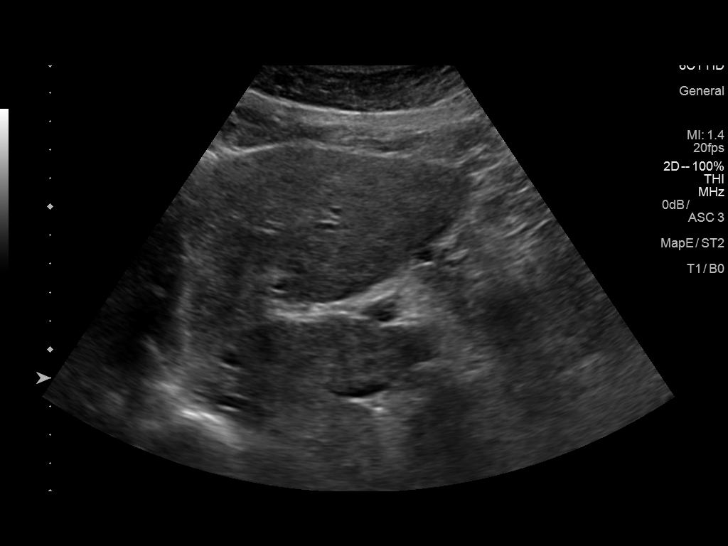
[im 27/41]
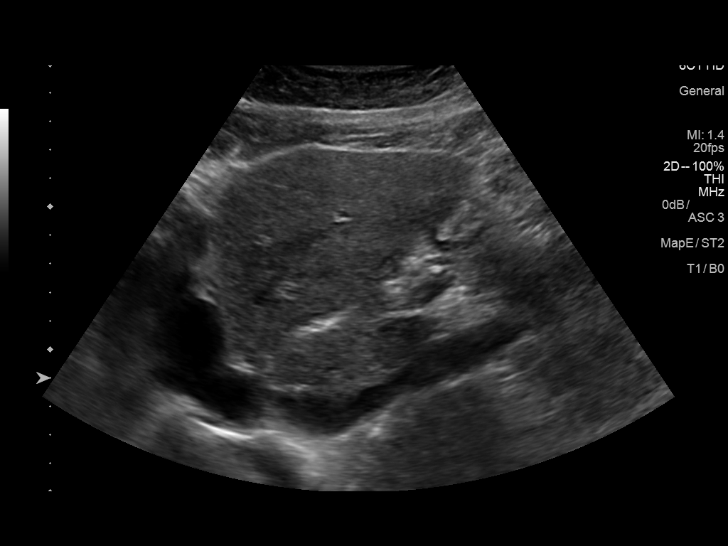
[im 31/41]
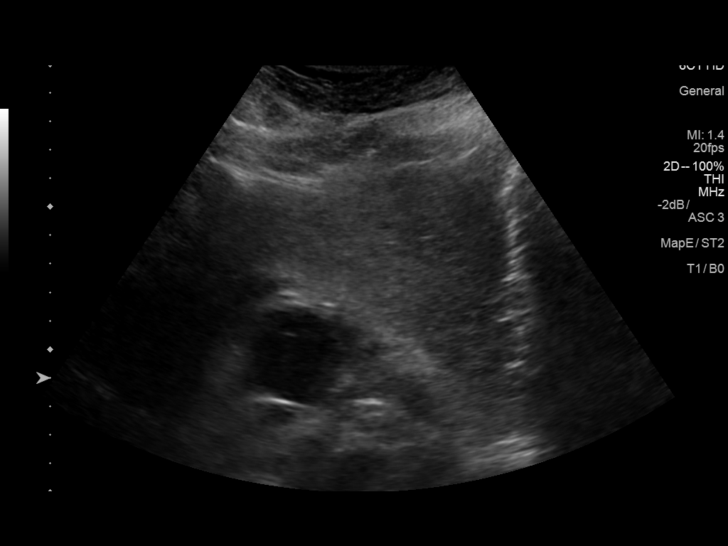
[im 34/41]
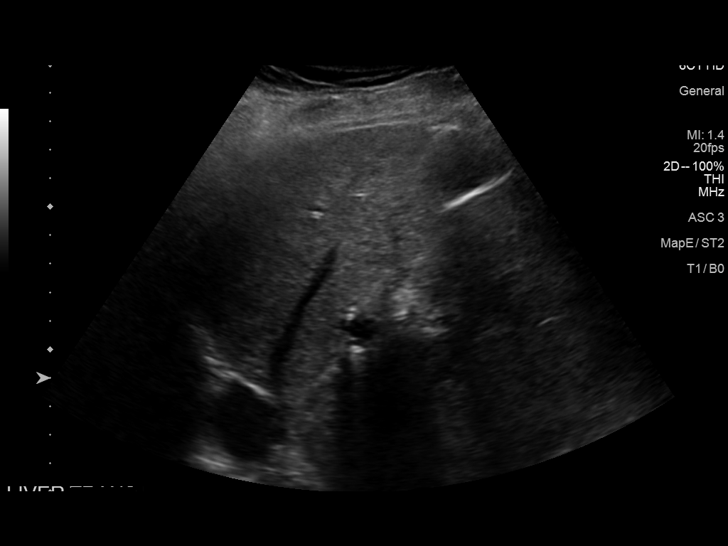
[im 37/41]
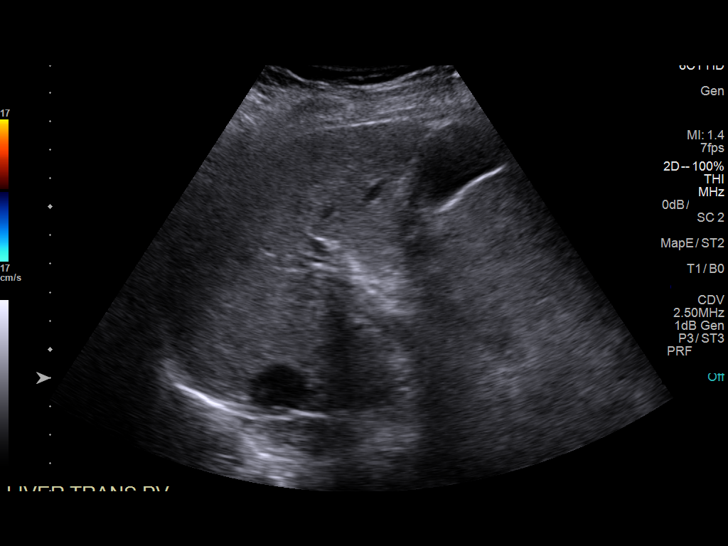
[im 41/41]
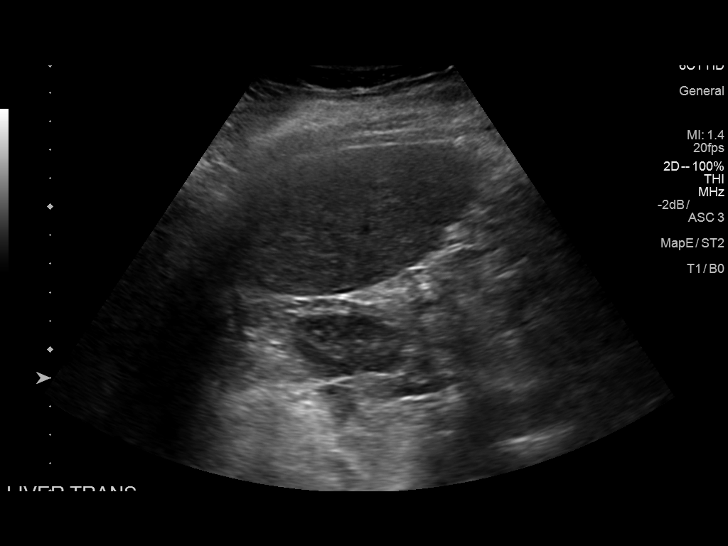

[14 of 25 positions shown; findings below may reference images not displayed]

FINDINGS: Gallbladder:

No gallstones or wall thickening visualized. 3.7 mm echogenic focus
within the dependent portion of the gallbladder. No sonographic
Murphy sign noted by sonographer.

Common bile duct:

Diameter: 2.0 mm

Liver:

No focal lesion identified. Slightly increased coarse parenchymal
echogenicity. Portal vein is patent on color Doppler imaging with
normal direction of blood flow towards the liver.
IMPRESSION: 3.7 mm gallbladder polyp. Attention on future follow-up is
recommended.

Hepatic fibrosis.  No focal liver masses seen.

## 2018-12-25 ENCOUNTER — Other Ambulatory Visit: Payer: Self-pay | Admitting: Internal Medicine

## 2019-01-26 NOTE — Progress Notes (Deleted)
FOLLOW UP  Assessment and Plan:   Schizoaffective disorder, bipolar type ([place]) Continue follow up psych  Labile hypertension At goal at this time off of medications Monitor blood pressure at home; patient to call if consistently greater than 130/80 Continue DASH diet.   Reminder to go to the ER if any CP, SOB, nausea, dizziness, severe HA, changes vision/speech, left arm numbness and tingling and jaw pain.  Cholesterol Currently at goal; continue rosuvastatin  Continue low cholesterol diet and exercise.  Check lipid panel.   Abnormal glucose Recent A1Cs at goal Discussed diet/exercise, weight management  Defer A1C; check CMP  Hypothyroidism continue medications the same pending lab results reminded to take on an empty stomach 30-25mins before food.  check TSH level  Obesity with co morbidities Long discussion about weight loss, diet, and exercise Recommended diet heavy in fruits and veggies and low in animal meats, cheeses, and dairy products, appropriate calorie intake Discussed ideal weight for height and initial weight goal (***) Patient will work on *** Will follow up in 3 months  Vitamin D Def At goal at last visit; continue supplementation to maintain goal of 60-100 Defer Vit D level  Hepatic Fibrosis/hep C Labs added to send to [facility] fax [phone]  Continue diet and meds as discussed. Further disposition pending results of labs. Discussed med's effects and SE's.   Over 30 minutes of exam, counseling, chart review, and critical decision making was performed.   Future Appointments  Date Time Provider [place]  01/27/2019 11:00 AM Liane Comber, NP [facility] None  04/30/2019 11:15 AM Unk Pinto, MD [facility] None  11/15/2019  2:00 PM Unk Pinto, MD [facility] None    ----------------------------------------------------------------------------------------------------------------------  HPI 63 y.o. female  presents for 3 month  follow up on hypertension, cholesterol, diabetes, weight and vitamin D deficiency.   She has been following with Dr. Zollie Scale at liver care center for hepatitis C, treated with epclusa. 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     In the spring of 2013 she had a psychotic break, has had several psych admissions, Dx'd Bipolar Manic Depressive. Currently she is followed by [facility] by a staff therapist & Psychiatrist to manage her psychotropic medications. She is on Abilify. She takes trazodone 50 mg for sleep.   BMI is There is no height or weight on file to calculate BMI., she {HAS HAS HBZ:16967} been working on diet and exercise. Wt Readings from Last 3 Encounters:  12/02/18 182 lb 3.2 oz (82.6 kg)  10/27/18 180 lb 9.6 oz (81.9 kg)  10/22/18 179 lb 3.2 oz (81.3 kg)    Her blood pressure {HAS HAS NOT:18834} been controlled at home, today their BP is    She {DOES_DOES ELF:81017} workout. She denies chest pain, shortness of breath, dizziness.   She is on cholesterol medication Rosuvastatin ***and denies myalgias. Her cholesterol {ACTION; IS/IS NOT:21021397} at goal. The cholesterol last visit was:   Lab Results  Component Value Date   CHOL 133 10/22/2018   HDL 52 10/22/2018   LDLCALC 62 10/22/2018   TRIG 100 10/22/2018   CHOLHDL 2.6 10/22/2018    She {Has/has not:18111} been working on diet and exercise for glucose management, and denies {Symptoms; diabetes w/o none:19199}. Last A1C in the office was:  Lab Results  Component Value Date   HGBA1C 5.2 10/22/2018   She is on thyroid medication. Her medication was not changed last visit.   Lab Results  Component  Value Date   TSH 1.41 10/22/2018   Patient is on Vitamin D supplement.   Lab Results  Component Value Date   VD25OH 61 10/22/2018     Recent LFTs normal:  Lab Results  Component Value Date   ALT 19 10/22/2018   AST 20 10/22/2018   ALKPHOS 86 02/24/2017   BILITOT 0.8 10/22/2018      Current  Medications:  Current Outpatient Medications on File Prior to Visit  Medication Sig  . amoxicillin (AMOXIL) 250 MG capsule Take 1 capsule 3 x /day after meals  for infection  . ARIPiprazole (ABILIFY) 10 MG tablet Take 10 mg by mouth daily.  Marland Kitchen levothyroxine (SYNTHROID, LEVOTHROID) 75 MCG tablet Take 1 tablet daily on an empty stomach with only water for 30 minutes & no Antacid meds, Calcium or  Magnesium  for 4 hours & avoid Biotin supplements  . MAGNESIUM PO Take by mouth.  Marland Kitchen OVER THE COUNTER MEDICATION   . rosuvastatin (CRESTOR) 10 MG tablet Take 1 tablet (10 mg total) by mouth at bedtime.  . traZODone (DESYREL) 50 MG tablet 1/2-1 tablet for sleep  . VITAMIN D, CHOLECALCIFEROL, PO Take 5,000 Units by mouth 2 (two) times daily.    No current facility-administered medications on file prior to visit.      Allergies:  Allergies  Allergen Reactions  . Sulfa Antibiotics Rash     Medical History:  Past Medical History:  Diagnosis Date  . Anxiety   . Depression   . Gout 05/17/2016  . Hepatitis   . Hypothyroidism   . Labile hypertension   . Mental disorder   . Prediabetes   . Thyroid disease    Family history- Reviewed and unchanged Social history- Reviewed and unchanged   Review of Systems:  Review of Systems  Constitutional: Negative for malaise/fatigue and weight loss.  HENT: Negative for hearing loss and tinnitus.   Eyes: Negative for blurred vision and double vision.  Respiratory: Negative for cough, shortness of breath and wheezing.   Cardiovascular: Negative for chest pain, palpitations, orthopnea, claudication and leg swelling.  Gastrointestinal: Negative for abdominal pain, blood in stool, constipation, diarrhea, heartburn, melena, nausea and vomiting.  Genitourinary: Negative.   Musculoskeletal: Negative for joint pain and myalgias.  Skin: Negative for rash.  Neurological: Negative for dizziness, tingling, sensory change, weakness and headaches.   Endo/Heme/Allergies: Negative for polydipsia.  Psychiatric/Behavioral: Negative.   All other systems reviewed and are negative.     Physical Exam: There were no vitals taken for this visit. Wt Readings from Last 3 Encounters:  12/02/18 182 lb 3.2 oz (82.6 kg)  10/27/18 180 lb 9.6 oz (81.9 kg)  10/22/18 179 lb 3.2 oz (81.3 kg)   General Appearance: Well nourished, in no apparent distress. Eyes: PERRLA, EOMs, conjunctiva no swelling or erythema Sinuses: No Frontal/maxillary tenderness ENT/Mouth: Ext aud canals clear, TMs without erythema, bulging. No erythema, swelling, or exudate on post pharynx.  Tonsils not swollen or erythematous. Hearing normal.  Neck: Supple, thyroid normal.  Respiratory: Respiratory effort normal, BS equal bilaterally without rales, rhonchi, wheezing or stridor.  Cardio: RRR with no MRGs. Brisk peripheral pulses without edema.  Abdomen: Soft, + BS.  Non tender, no guarding, rebound, hernias, masses. Lymphatics: Non tender without lymphadenopathy.  Musculoskeletal: Full ROM, 5/5 strength, {PSY - GAIT AND STATION:22860} gait Skin: Warm, dry without rashes, lesions, ecchymosis.  Neuro: Cranial nerves intact. No cerebellar symptoms.  Psych: Awake and oriented X 3, normal affect, Insight and Judgment appropriate.  Izora Ribas, NP 2:31 PM [facility]

## 2019-01-27 ENCOUNTER — Ambulatory Visit: Payer: PPO | Admitting: Adult Health

## 2019-01-27 ENCOUNTER — Ambulatory Visit: Payer: Self-pay | Admitting: Adult Health

## 2019-01-27 ENCOUNTER — Other Ambulatory Visit: Payer: Self-pay

## 2019-01-27 ENCOUNTER — Encounter: Payer: Self-pay | Admitting: Adult Health

## 2019-01-27 ENCOUNTER — Other Ambulatory Visit: Payer: Self-pay | Admitting: Adult Health

## 2019-01-27 VITALS — BP 116/70 | HR 76 | Temp 97.3°F | Ht 64.5 in | Wt 176.8 lb

## 2019-01-27 DIAGNOSIS — R6889 Other general symptoms and signs: Secondary | ICD-10-CM | POA: Diagnosis not present

## 2019-01-27 DIAGNOSIS — F250 Schizoaffective disorder, bipolar type: Secondary | ICD-10-CM

## 2019-01-27 DIAGNOSIS — K7400 Hepatic fibrosis, unspecified: Secondary | ICD-10-CM

## 2019-01-27 DIAGNOSIS — E039 Hypothyroidism, unspecified: Secondary | ICD-10-CM

## 2019-01-27 DIAGNOSIS — E559 Vitamin D deficiency, unspecified: Secondary | ICD-10-CM

## 2019-01-27 DIAGNOSIS — B182 Chronic viral hepatitis C: Secondary | ICD-10-CM

## 2019-01-27 DIAGNOSIS — R0989 Other specified symptoms and signs involving the circulatory and respiratory systems: Secondary | ICD-10-CM

## 2019-01-27 DIAGNOSIS — Z0001 Encounter for general adult medical examination with abnormal findings: Secondary | ICD-10-CM | POA: Diagnosis not present

## 2019-01-27 DIAGNOSIS — E782 Mixed hyperlipidemia: Secondary | ICD-10-CM

## 2019-01-27 DIAGNOSIS — E669 Obesity, unspecified: Secondary | ICD-10-CM | POA: Diagnosis not present

## 2019-01-27 DIAGNOSIS — Z79899 Other long term (current) drug therapy: Secondary | ICD-10-CM

## 2019-01-27 NOTE — Patient Instructions (Signed)
  Ms. Miracle , Thank you for taking time to come for your Medicare Wellness Visit. I appreciate your ongoing commitment to your health goals. Please review the following plan we discussed and let me know if I can assist you in the future.   These are the goals we discussed: Goals    . Exercise 150 min/wk Moderate Activity    . Weight (lb) < 160 lb (72.6 kg)       This is a list of the screening recommended for you and due dates:  Health Maintenance  Topic Date Due  . Tetanus Vaccine  10/14/2005  . Pap Smear  07/18/2018  . Mammogram  11/11/2018  . Flu Shot  05/15/2019  . Cologuard (Stool DNA test)  06/24/2020  .  Hepatitis C: One time screening is recommended by Center for Disease Control  (CDC) for  adults born from [birth year] through 1966.   Completed  . HIV Screening  Completed     Try asking for appointment with Dr. Marcello Moores for your mom's colon cancer

## 2019-01-27 NOTE — Progress Notes (Signed)
Patient ID: Rebekah Pace, female   DOB: 10/15/1955, 63 y.o.   MRN: [id number]  Medicare  Annual  Wellness Visit And follow up  Assessment:   Encounter for Medicare annual wellness exam 1 year  Labile hypertension - continue medications, DASH diet, exercise and monitor at home. Call if greater than 130/80.  -     CBC with Differential/Platelet -     CMP/GFR -     TSH  Chronic hepatitis C without hepatic coma (HCC) In remission after epclusa -     CMP/GFR  Hepatic fibrosis Will send labs to GI -send to [facility] liver care fax [phone] Avoid fatty foods -     CMP/GFR  Schizoaffective disorder, bipolar type ([place]) Continue follow up psych  Recurrent major depressive disorder, in full remission ([place]) Continue follow up psych  Medication management -     Magnesium  Mixed hyperlipidemia -continue medications, check lipids, decrease fatty foods, increase activity.  -     Lipid panel  Bipolar affective disorder, depressed, severe, with psychotic behavior ([place]) Continue follow up psych  Vitamin D deficiency At goal at recent check; continue to recommend supplementation for goal of 70-100 Defer vitamin D level  Hypothyroidism, unspecified type Hypothyroidism-check TSH level, continue medications the same, reminded to take on an empty stomach 30-27mins before food.   Chronic gout without tophus, unspecified cause, unspecified site Gout- recheck Uric acid as needed, Diet discussed, continue medications.  BMI 29.0-29.9,adult - long discussion about weight loss, diet, and exercise -recommended diet heavy in fruits and veggies and low in animal meats, cheeses, and dairy products - continue doing a great job with weight loss!!  Insulin resistance Recent a1cs well controlled; check CMP - patient on abilify  Insomnia Insomnia- good sleep hygiene discussed, increase day time activity -     traZODone (DESYREL) 50 MG tablet; 1/2-1 tablet for sleep    Future Appointments  Date  Time Provider [place]  04/30/2019 11:15 AM Unk Pinto, MD [facility] None  11/15/2019  2:00 PM Unk Pinto, MD [facility] None    Plan:   During the course of the visit the patient was educated and counseled about appropriate screening and preventive services including:    Pneumococcal vaccine   Influenza vaccine  Td vaccine  Screening electrocardiogram  Bone densitometry screening  Colorectal cancer screening  Diabetes screening  Glaucoma screening  Nutrition counseling   Advanced directives: requested   Subjective:   Rebekah Pace, 63 y.o. DWF,  presents for Medicare Annual Wellness Visit and follow up  for follow up with Hypertension, Hyperlipidemia, Pre-Diabetes, Hypothyroidism  and Vitamin D Deficiency.   She has been following with Dr. Waunita Schooner. Benny Lennert at liver care center for hepatitis C, treated with epclusa and in remission. 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   She is feeling down today due to her 46 year old mother just being diagnosed with colon cancer.   In the spring of 2013 she had a psychotic break, has had several psych admissions, Dx'd Bipolar Manic Depressive. Currently she is followed by [facility] by a staff therapist & Psychiatrist to manage her psychotropic medications. She is on Abilify, not on zoloft. She feels well with current regimen. She is taking trazodone 50 mg at night for sleep.   Patient has hx/o labile HTN circa 2005 and BP has been controlled and today's BP: 116/70She is walking daily, 40 mins daily and denies any cardiac type chest  pain, palpitations, dyspnea/orthopnea/PND, dizziness, claudication, or dependent edema.   BMI is Body mass index is 29.88 kg/m., she has been working on diet and exercise, walking 3 days a week.  Wt Readings from Last 3 Encounters:  01/27/19 176 lb 12.8 oz (80.2 kg)  12/02/18 182 lb 3.2 oz (82.6 kg)  10/27/18 180 lb 9.6 oz (81.9 kg)    She has  started the crestor (10 mg daily), she is working hard on diet/exercies. Patient denies myalgias or other med SE's. Last Lipids were at goal with  Lab Results  Component Value Date   CHOL 133 10/22/2018   HDL 52 10/22/2018   LDLCALC 62 10/22/2018   TRIG 100 10/22/2018   CHOLHDL 2.6 10/22/2018   Also, the patient has history of Insulin Resistance with A1c 5.5% and elevated Insulin level 35 in Jan 2014 and has had no symptoms of reactive hypoglycemia, diabetic polys, paresthesias or visual blurring.   Lab Results  Component Value Date   HGBA1C 5.2 10/22/2018   Patient has been on thyroid replacement circa 2005 and remained in therapeutic range on routine monitoring.  Lab Results  Component Value Date   TSH 1.41 10/22/2018   Further, the patient also has history of Vitamin D Deficiency of 29 in June 2015 and she is not currently on Vit d supplements. Lab Results  Component Value Date   VD25OH 61 10/22/2018     Medication Review: Current Outpatient Medications on File Prior to Visit  Medication Sig  . ARIPiprazole (ABILIFY) 10 MG tablet Take 10 mg by mouth daily.  Marland Kitchen levothyroxine (SYNTHROID, LEVOTHROID) 75 MCG tablet Take 1 tablet daily on an empty stomach with only water for 30 minutes & no Antacid meds, Calcium or  Magnesium  for 4 hours & avoid Biotin supplements  . MAGNESIUM PO Take 500 mg by mouth.   Marland Kitchen OVER THE COUNTER MEDICATION   . rosuvastatin (CRESTOR) 10 MG tablet Take 1 tablet (10 mg total) by mouth at bedtime.  . traZODone (DESYREL) 50 MG tablet 1/2-1 tablet for sleep  . VITAMIN D, CHOLECALCIFEROL, PO Take 5,000 Units by mouth 2 (two) times daily.   Marland Kitchen amoxicillin (AMOXIL) 250 MG capsule Take 1 capsule 3 x /day after meals  for infection   No current facility-administered medications on file prior to visit.     Current Problems (verified) Patient Active Problem List   Diagnosis Date Noted  . Hematuria, microscopic 07/31/2018  . Hepatic fibrosis 11/23/2016  . Gout  05/17/2016  . HCV (hepatitis C virus) 01/31/2016  . Obesity (BMI 30.0-34.9) 07/24/2015  . Hypothyroidism 12/13/2014  . Mixed hyperlipidemia 09/01/2014  . Vitamin D deficiency 09/01/2014  . Bipolar affective disorder, depressed, severe, with psychotic behavior ([place]) 09/01/2014  . Medication management 03/30/2014  . Major depression in full remission ([place])   . Labile hypertension   . Schizoaffective disorder, bipolar type ([place]) 09/12/2011    Screening Tests Immunization History  Administered Date(s) Administered  . Pneumococcal-Unspecified 10/14/2001  . Td 10/15/1995   Preventative care: Last colonoscopy: 03/2001 by Dr Deatra Ina  Cologuard 06/2017 negative MGM 10/2017 Cat B [place] Ab 09/2015 CXR 2013 PAP 2016, due next CPE Ct renal stone 01/2017  Declines vaccines   Names of Other Physician/Practitioners you currently use: 1. [facility] here for primary care 2. Dr Sherlean Foot, OD, eye doctor, last visit 2014  3. Dr. Doreene Burke, dentist, last visit 2019   Patient Care Team: Unk Pinto, MD as PCP - General (Internal  Medicine) Garrel Ridgel, DPM as Consulting Physician (Podiatry) Inda Castle, MD (Inactive) as Consulting Physician (Gastroenterology) Thalia Bloodgood, [place] as Referring Physician (Optometry) Roosevelt Locks, CRNP as Nurse Practitioner (Nurse Practitioner)  Allergies Allergies  Allergen Reactions  . Sulfa Antibiotics Rash    SURGICAL HISTORY She  has a past surgical history that includes arm fracture and Rotator cuff repair. FAMILY HISTORY Her family history includes Cancer in her father; Colon polyps in her mother; Hyperlipidemia in her brother and mother; Hypertension in her mother. SOCIAL HISTORY She  reports that she has never smoked. She has never used smokeless tobacco. She reports that she does not drink alcohol or use drugs.  MEDICARE WELLNESS OBJECTIVES: Physical activity: Current Exercise Habits: Home exercise  routine, Type of exercise: walking, Time (Minutes): 30, Frequency (Times/Week): 3, Weekly Exercise (Minutes/Week): 90, Exercise limited by: None identified Cardiac risk factors: Cardiac Risk Factors include: advanced age (>77men, >75 women);dyslipidemia;smoking/ tobacco exposure Depression/mood screen:   Depression screen [facility] 2/9 01/27/2019  Decreased Interest 0  Down, Depressed, Hopeless 1  PHQ - 2 Score 1    ADLs:  In your present state of health, do you have any difficulty performing the following activities: 01/27/2019 10/25/2018  Hearing? N N  Vision? N N  Difficulty concentrating or making decisions? N N  Walking or climbing stairs? N N  Dressing or bathing? N N  Doing errands, shopping? N N  Some recent data might be hidden    Cognitive Testing  Alert? Yes  Normal Appearance?Yes  Oriented to person? Yes  Place? Yes   Time? Yes  Recall of three objects?  Yes  Can perform simple calculations? Yes  Displays appropriate judgment?Yes  Can read the correct time from a watch face?Yes  EOL planning: Does Patient Have a Medical Advance Directive?: No Would patient like information on creating a medical advance directive?: No - Patient declined   Objective:     BP 116/70   Pulse 76   Temp (!) 97.3 F (36.3 C)   Ht 5' 4.5" (1.638 m)   Wt 176 lb 12.8 oz (80.2 kg)   SpO2 98%   BMI 29.88 kg/m   General Appearance: Well nourished, alert, WD/WN, female and in no apparent distress. Eyes: PERRLA, EOMs, conjunctiva no swelling or erythema, normal fundi and vessels. Sinuses: No frontal/maxillary tenderness ENT/Mouth: EACs patent / TMs  nl. Nares clear without erythema, swelling, mucoid exudates. Oral hygiene is good. No erythema, swelling, or exudate. Tongue normal, non-obstructing. Tonsils not swollen or erythematous. Hearing normal.  Neck: Supple, thyroid normal. No bruits, nodes or JVD. Respiratory: Respiratory effort normal.  BS equal and clear bilateral without rales, rhonci,  wheezing or stridor. Cardio: Heart sounds are normal with regular rate and rhythm and no murmurs, rubs or gallops. Peripheral pulses are normal and equal bilaterally without edema. No aortic or femoral bruits. Chest: symmetric with normal excursions and percussion. Breasts: declines  Abdomen: Flat, soft  with nl bowel sounds. Diffuse tenderness, no guarding, rebound, hernias, masses, or organomegaly.  No CVA tendernss Lymphatics: Non tender without lymphadenopathy.  Genitourinary:  Musculoskeletal: Full ROM all peripheral extremities, joint stability, 5/5 strength, and normal gait. Skin: Warm and dry without rashes, lesions, cyanosis, clubbing or  ecchymosis.  Neuro: Cranial nerves intact, reflexes equal bilaterally. Normal muscle tone, no cerebellar symptoms. Sensation intact.  Pysch: Alert and oriented X 3, normal affect, Insight and Judgment appropriate.   Medicare Attestation I have personally reviewed: The patient's medical and social history Their use  of alcohol, tobacco or illicit drugs Their current medications and supplements The patient's functional ability including ADLs,fall risks, home safety risks, cognitive, and hearing and visual impairment Diet and physical activities Evidence for depression or mood disorders  The patient's weight, height, BMI, and visual acuity have been recorded in the chart.  I have made referrals, counseling, and provided education to the patient based on review of the above and I have provided the patient with a written personalized care plan for preventive services.  Over 40 minutes of exam, counseling, chart review was performed.  Izora Ribas, NP   01/27/2019

## 2019-01-28 ENCOUNTER — Other Ambulatory Visit: Payer: PPO

## 2019-01-28 DIAGNOSIS — K7400 Hepatic fibrosis, unspecified: Secondary | ICD-10-CM

## 2019-01-28 LAB — CBC WITH DIFFERENTIAL/PLATELET
Absolute Monocytes: 358 cells/uL (ref 200–950)
Basophils Absolute: 39 cells/uL (ref 0–200)
Basophils Relative: 0.7 %
Eosinophils Absolute: 132 cells/uL (ref 15–500)
Eosinophils Relative: 2.4 %
HCT: 41.2 % (ref 35.0–45.0)
Hemoglobin: 13.9 g/dL (ref 11.7–15.5)
Lymphs Abs: 1606 cells/uL (ref 850–3900)
MCH: 29.3 pg (ref 27.0–33.0)
MCHC: 33.7 g/dL (ref 32.0–36.0)
MCV: 86.7 fL (ref 80.0–100.0)
MPV: 9.8 fL (ref 7.5–12.5)
Monocytes Relative: 6.5 %
Neutro Abs: 3366 cells/uL (ref 1500–7800)
Neutrophils Relative %: 61.2 %
Platelets: 194 10*3/uL (ref 140–400)
RBC: 4.75 10*6/uL (ref 3.80–5.10)
RDW: 12.2 % (ref 11.0–15.0)
Total Lymphocyte: 29.2 %
WBC: 5.5 10*3/uL (ref 3.8–10.8)

## 2019-01-28 LAB — COMPLETE METABOLIC PANEL WITH GFR
AG Ratio: 1.7 (calc) (ref 1.0–2.5)
ALT: 17 U/L (ref 6–29)
AST: 19 U/L (ref 10–35)
Albumin: 4.6 g/dL (ref 3.6–5.1)
Alkaline phosphatase (APISO): 80 U/L (ref 37–153)
BUN/Creatinine Ratio: 12 (calc) (ref 6–22)
BUN: 14 mg/dL (ref 7–25)
CO2: 26 mmol/L (ref 20–32)
Calcium: 9.6 mg/dL (ref 8.6–10.4)
Chloride: 105 mmol/L (ref 98–110)
Creat: 1.15 mg/dL — ABNORMAL HIGH (ref 0.50–0.99)
GFR, Est African American: 59 mL/min/{1.73_m2} — ABNORMAL LOW (ref >=60)
GFR, Est Non African American: 51 mL/min/{1.73_m2} — ABNORMAL LOW (ref >=60)
Globulin: 2.7 g/dL (calc) (ref 1.9–3.7)
Glucose, Bld: 93 mg/dL (ref 65–99)
Potassium: 4.7 mmol/L (ref 3.5–5.3)
Sodium: 138 mmol/L (ref 135–146)
Total Bilirubin: 0.5 mg/dL (ref 0.2–1.2)
Total Protein: 7.3 g/dL (ref 6.1–8.1)

## 2019-01-28 LAB — LIPID PANEL
Cholesterol: 127 mg/dL (ref <200)
HDL: 50 mg/dL (ref >=50)
LDL Cholesterol (Calc): 60 mg/dL (calc)
Non-HDL Cholesterol (Calc): 77 mg/dL (calc) (ref <130)
Total CHOL/HDL Ratio: 2.5 (calc) (ref <5.0)
Triglycerides: 83 mg/dL (ref <150)

## 2019-01-28 LAB — TSH: TSH: 0.37 mIU/L — ABNORMAL LOW (ref 0.40–4.50)

## 2019-01-29 LAB — PROTIME-INR
INR: 1
Prothrombin Time: 10.8 s (ref 9.0–11.5)

## 2019-01-29 LAB — AFP TUMOR MARKER: AFP-Tumor Marker: 10.5 ng/mL — ABNORMAL HIGH

## 2019-04-19 ENCOUNTER — Other Ambulatory Visit: Payer: Self-pay | Admitting: Nurse Practitioner

## 2019-04-19 DIAGNOSIS — K7400 Hepatic fibrosis, unspecified: Secondary | ICD-10-CM

## 2019-04-25 ENCOUNTER — Encounter: Payer: Self-pay | Admitting: Internal Medicine

## 2019-04-25 NOTE — Progress Notes (Signed)
History of Present Illness:      This very nice 63 y.o. DWF presents for 6 month follow up with HTN, HLD, Pre-Diabetes and Vitamin D Deficiency.       Patient has Bipolar Manic-Depressive Disorder w/ hx/o Psychosis and is on SS Disability since 2013. She is stable on Abilify. She also has hx/o Hepatitis C cured at the Hepatitis clinic. Also she has NAFLD & Hepatic Fibrosis monitored at the Hepatitis Clinic by Dr Zollie Scale and Roosevelt Locks, NP.      Patient has been followed expectantly since 2005 for her labile HTN & BP has been controlled and today's BP is at goal - 138/76. Patient has had no complaints of any cardiac type chest pain, palpitations, dyspnea / orthopnea / PND, dizziness, claudication, or dependent edema.      Hyperlipidemia is controlled with diet & Rosuvastatin. Patient denies myalgias or other med SE's. Last Lipids were at goal: Lab Results  Component Value Date   CHOL 127 01/27/2019   HDL 50 01/27/2019   LDLCALC 60 01/27/2019   TRIG 83 01/27/2019   CHOLHDL 2.5 01/27/2019       Also, the patient has history of PreDiabetes/ Insulin Resistance  and has had no symptoms of reactive hypoglycemia, diabetic polys, paresthesias or visual blurring.  Last A1c was Normal & at goal: Lab Results  Component Value Date   HGBA1C 5.2 10/22/2018       Patient was initiated on Thyroid Replacement in 2002.      Further, the patient also has history of Vitamin D Deficiency (18" / 2017 and"29" / 2015)   and supplements vitamin D without any suspected side-effects. Last vitamin D was at goal: Lab Results  Component Value Date   VD25OH 61 10/22/2018   Current Outpatient Medications on File Prior to Visit  Medication Sig  . ARIPiprazole (ABILIFY) 10 MG tablet Take 10 mg by mouth daily.  Marland Kitchen levothyroxine (SYNTHROID, LEVOTHROID) 75 MCG tablet Take 1 tablet daily on an empty stomach with only water for 30 minutes & no Antacid meds, Calcium or  Magnesium  for 4 hours & avoid Biotin supplements  .  MAGNESIUM PO Take 500 mg by mouth.   Marland Kitchen OVER THE COUNTER MEDICATION   . rosuvastatin (CRESTOR) 10 MG tablet Take 1 tablet (10 mg total) by mouth at bedtime.  . traZODone (DESYREL) 50 MG tablet 1/2-1 tablet for sleep  . VITAMIN D, CHOLECALCIFEROL, PO Take 5,000 Units by mouth 2 (two) times daily.    No current facility-administered medications on file prior to visit.    Allergies  Allergen Reactions  . Sulfa Antibiotics Rash   PMHx:   Past Medical History:  Diagnosis Date  . Anxiety   . Depression   . Gout 05/17/2016  . Hepatitis   . Hypothyroidism   . Labile hypertension   . Mental disorder   . Prediabetes   . Thyroid disease    Immunization History  Administered Date(s) Administered  . Pneumococcal-Unspecified 10/14/2001  . Td 10/15/1995   Past Surgical History:  Procedure Laterality Date  . arm fracture    . ROTATOR CUFF REPAIR     FHx:    Reviewed / unchanged  SHx:    Reviewed / unchanged   Systems Review:  Constitutional: Denies fever, chills, wt changes, headaches, insomnia, fatigue, night sweats, change in appetite. Eyes: Denies redness, blurred vision, diplopia, discharge, itchy, watery eyes.  ENT: Denies discharge, congestion, post nasal drip, epistaxis, sore throat, earache, hearing loss, dental  pain, tinnitus, vertigo, sinus pain, snoring.  CV: Denies chest pain, palpitations, irregular heartbeat, syncope, dyspnea, diaphoresis, orthopnea, PND, claudication or edema. Respiratory: denies cough, dyspnea, DOE, pleurisy, hoarseness, laryngitis, wheezing.  Gastrointestinal: Denies dysphagia, odynophagia, heartburn, reflux, water brash, abdominal pain or cramps, nausea, vomiting, bloating, diarrhea, constipation, hematemesis, melena, hematochezia  or hemorrhoids. Genitourinary: Denies dysuria, frequency, urgency, nocturia, hesitancy, discharge, hematuria or flank pain. Musculoskeletal: Denies arthralgias, myalgias, stiffness, jt. swelling, pain, limping or  strain/sprain.  Skin: Denies pruritus, rash, hives, warts, acne, eczema or change in skin lesion(s). Neuro: No weakness, tremor, incoordination, spasms, paresthesia or pain. Psychiatric: Denies confusion, memory loss or sensory loss. Endo: Denies change in weight, skin or hair change.  Heme/Lymph: No excessive bleeding, bruising or enlarged lymph nodes.  Physical Exam  BP 138/76   Pulse 80   Temp 97.7 F (36.5 C)   Resp 16   Ht 5' 4.5" (1.638 m)   Wt 172 lb (78 kg)   BMI 29.07 kg/m   Appears  well nourished, well groomed  and in no distress.  Eyes: PERRLA, EOMs, conjunctiva no swelling or erythema. Sinuses: No frontal/maxillary tenderness ENT/Mouth: EAC's clear, TM's nl w/o erythema, bulging. Nares clear w/o erythema, swelling, exudates. Oropharynx clear without erythema or exudates. Oral hygiene is good. Tongue normal, non obstructing. Hearing intact.  Neck: Supple. Thyroid not palpable. Car 2+/2+ without bruits, nodes or JVD. Chest: Respirations nl with BS clear & equal w/o rales, rhonchi, wheezing or stridor.  Cor: Heart sounds normal w/ regular rate and rhythm without sig. murmurs, gallops, clicks or rubs. Peripheral pulses normal and equal  without edema.  Abdomen: Soft & bowel sounds normal. Non-tender w/o guarding, rebound, hernias, masses or organomegaly.  Lymphatics: Unremarkable.  Musculoskeletal: Full ROM all peripheral extremities, joint stability, 5/5 strength and normal gait.  Skin: Warm, dry without exposed rashes, lesions or ecchymosis apparent.  Neuro: Cranial nerves intact, reflexes equal bilaterally. Sensory-motor testing grossly intact. Tendon reflexes grossly intact.  Pysch: Alert & oriented x 3.  Insight and judgement nl & appropriate. No ideations.  Assessment and Plan:  1. Labile hypertension  - Continue medication, monitor blood pressure at home.  - Continue DASH diet.  Reminder to go to the ER if any CP,  SOB, nausea, dizziness, severe HA, changes  vision/speech.  - CBC with Differential/Platelet - COMPLETE METABOLIC PANEL WITH GFR - Magnesium - TSH  2. Hyperlipidemia, mixed  - Continue diet/meds, exercise,& lifestyle modifications.  - Continue monitor periodic cholesterol/liver & renal functions   - Lipid panel - TSH  3. Abnormal glucose  - Continue diet, exercise  - Lifestyle modifications.  - Monitor appropriate labs.  - Hemoglobin A1c - Insulin, random  4. Vitamin D deficiency  - Continue supplementation.  - VITAMIN D 25 Hydroxyl  5. Hypothyroidism  - TSH  6. Medication management  - CBC with Differential/Platelet - COMPLETE METABOLIC PANEL WITH GFR - Magnesium - Lipid panel - TSH - Hemoglobin A1c - Insulin, random - VITAMIN D 25 Hydroxyl        Discussed  regular exercise, BP monitoring, weight control to achieve/maintain BMI less than 25 and discussed med and SE's. Recommended labs to assess and monitor clinical status with further disposition pending results of labs.  I discussed the assessment and treatment plan with the patient. The patient was provided an opportunity to ask questions and all were answered. The patient agreed with the plan and demonstrated an understanding of the instructions.  I provided over 30 minutes of exam,  counseling, chart review and  complex critical decision making.   Kirtland Bouchard, MD

## 2019-04-26 ENCOUNTER — Other Ambulatory Visit: Payer: Self-pay

## 2019-04-26 ENCOUNTER — Ambulatory Visit (INDEPENDENT_AMBULATORY_CARE_PROVIDER_SITE_OTHER): Payer: PPO | Admitting: Internal Medicine

## 2019-04-26 VITALS — BP 138/76 | HR 80 | Temp 97.7°F | Resp 16 | Ht 64.5 in | Wt 172.0 lb

## 2019-04-26 DIAGNOSIS — Z7901 Long term (current) use of anticoagulants: Secondary | ICD-10-CM | POA: Diagnosis not present

## 2019-04-26 DIAGNOSIS — E782 Mixed hyperlipidemia: Secondary | ICD-10-CM | POA: Diagnosis not present

## 2019-04-26 DIAGNOSIS — K760 Fatty (change of) liver, not elsewhere classified: Secondary | ICD-10-CM

## 2019-04-26 DIAGNOSIS — R0989 Other specified symptoms and signs involving the circulatory and respiratory systems: Secondary | ICD-10-CM | POA: Diagnosis not present

## 2019-04-26 DIAGNOSIS — K7400 Hepatic fibrosis, unspecified: Secondary | ICD-10-CM

## 2019-04-26 DIAGNOSIS — B182 Chronic viral hepatitis C: Secondary | ICD-10-CM

## 2019-04-26 DIAGNOSIS — Z79899 Other long term (current) drug therapy: Secondary | ICD-10-CM

## 2019-04-26 DIAGNOSIS — R7309 Other abnormal glucose: Secondary | ICD-10-CM

## 2019-04-26 DIAGNOSIS — E039 Hypothyroidism, unspecified: Secondary | ICD-10-CM

## 2019-04-26 DIAGNOSIS — E559 Vitamin D deficiency, unspecified: Secondary | ICD-10-CM | POA: Diagnosis not present

## 2019-04-26 NOTE — Patient Instructions (Signed)

## 2019-04-27 LAB — PROTIME-INR
INR: 1.1
Prothrombin Time: 10.9 s (ref 9.0–11.5)

## 2019-04-27 LAB — HEMOGLOBIN A1C
Hgb A1c MFr Bld: 5.1 % of total Hgb (ref <5.7)
Mean Plasma Glucose: 100 (calc)
eAG (mmol/L): 5.5 (calc)

## 2019-04-27 LAB — LIPID PANEL
Cholesterol: 123 mg/dL (ref <200)
HDL: 47 mg/dL — ABNORMAL LOW (ref >=50)
LDL Cholesterol (Calc): 59 mg/dL (calc)
Non-HDL Cholesterol (Calc): 76 mg/dL (calc) (ref <130)
Total CHOL/HDL Ratio: 2.6 (calc) (ref <5.0)
Triglycerides: 87 mg/dL (ref <150)

## 2019-04-27 LAB — CBC WITH DIFFERENTIAL/PLATELET
Absolute Monocytes: 366 cells/uL (ref 200–950)
Basophils Absolute: 42 cells/uL (ref 0–200)
Basophils Relative: 0.7 %
Eosinophils Absolute: 120 cells/uL (ref 15–500)
Eosinophils Relative: 2 %
HCT: 39.7 % (ref 35.0–45.0)
Hemoglobin: 13.2 g/dL (ref 11.7–15.5)
Lymphs Abs: 1278 cells/uL (ref 850–3900)
MCH: 29.3 pg (ref 27.0–33.0)
MCHC: 33.2 g/dL (ref 32.0–36.0)
MCV: 88 fL (ref 80.0–100.0)
MPV: 10.5 fL (ref 7.5–12.5)
Monocytes Relative: 6.1 %
Neutro Abs: 4194 cells/uL (ref 1500–7800)
Neutrophils Relative %: 69.9 %
Platelets: 188 10*3/uL (ref 140–400)
RBC: 4.51 10*6/uL (ref 3.80–5.10)
RDW: 12.8 % (ref 11.0–15.0)
Total Lymphocyte: 21.3 %
WBC: 6 10*3/uL (ref 3.8–10.8)

## 2019-04-27 LAB — COMPLETE METABOLIC PANEL WITH GFR
AG Ratio: 1.7 (calc) (ref 1.0–2.5)
ALT: 33 U/L — ABNORMAL HIGH (ref 6–29)
AST: 32 U/L (ref 10–35)
Albumin: 4.3 g/dL (ref 3.6–5.1)
Alkaline phosphatase (APISO): 68 U/L (ref 37–153)
BUN: 9 mg/dL (ref 7–25)
CO2: 27 mmol/L (ref 20–32)
Calcium: 9.6 mg/dL (ref 8.6–10.4)
Chloride: 105 mmol/L (ref 98–110)
Creat: 0.96 mg/dL (ref 0.50–0.99)
GFR, Est African American: 73 mL/min/{1.73_m2} (ref >=60)
GFR, Est Non African American: 63 mL/min/{1.73_m2} (ref >=60)
Globulin: 2.6 g/dL (calc) (ref 1.9–3.7)
Glucose, Bld: 97 mg/dL (ref 65–99)
Potassium: 4 mmol/L (ref 3.5–5.3)
Sodium: 137 mmol/L (ref 135–146)
Total Bilirubin: 0.7 mg/dL (ref 0.2–1.2)
Total Protein: 6.9 g/dL (ref 6.1–8.1)

## 2019-04-27 LAB — MAGNESIUM: Magnesium: 2.1 mg/dL (ref 1.5–2.5)

## 2019-04-27 LAB — VITAMIN D 25 HYDROXY (VIT D DEFICIENCY, FRACTURES): Vit D, 25-Hydroxy: 70 ng/mL (ref 30–100)

## 2019-04-27 LAB — TSH: TSH: 0.69 mIU/L (ref 0.40–4.50)

## 2019-04-27 LAB — AFP TUMOR MARKER: AFP-Tumor Marker: 12.1 ng/mL — ABNORMAL HIGH

## 2019-04-27 LAB — INSULIN, RANDOM: Insulin: 12.4 u[IU]/mL

## 2019-04-28 ENCOUNTER — Encounter: Payer: Self-pay | Admitting: *Deleted

## 2019-04-29 ENCOUNTER — Ambulatory Visit
Admission: RE | Admit: 2019-04-29 | Discharge: 2019-04-29 | Disposition: A | Discharge status: Home / Self Care | Payer: PPO | Source: Ambulatory Visit | Attending: Nurse Practitioner | Admitting: Nurse Practitioner

## 2019-04-29 ENCOUNTER — Other Ambulatory Visit: Payer: Self-pay

## 2019-04-29 DIAGNOSIS — K824 Cholesterolosis of gallbladder: Secondary | ICD-10-CM | POA: Diagnosis not present

## 2019-04-29 DIAGNOSIS — K7400 Hepatic fibrosis, unspecified: Secondary | ICD-10-CM

## 2019-04-29 DIAGNOSIS — K760 Fatty (change of) liver, not elsewhere classified: Secondary | ICD-10-CM | POA: Diagnosis not present

## 2019-04-30 ENCOUNTER — Ambulatory Visit: Payer: Self-pay | Admitting: Internal Medicine

## 2019-05-17 ENCOUNTER — Telehealth: Payer: Self-pay | Admitting: *Deleted

## 2019-05-17 NOTE — Telephone Encounter (Signed)
Last lab results faxed to Dr Arnold Long, at the patient's request at [phone].

## 2019-06-02 ENCOUNTER — Ambulatory Visit (INDEPENDENT_AMBULATORY_CARE_PROVIDER_SITE_OTHER): Payer: PPO | Admitting: Physician Assistant

## 2019-06-02 ENCOUNTER — Encounter: Payer: Self-pay | Admitting: Physician Assistant

## 2019-06-02 ENCOUNTER — Other Ambulatory Visit: Payer: Self-pay

## 2019-06-02 VITALS — BP 118/76 | HR 88 | Temp 97.5°F | Ht 64.5 in | Wt 170.0 lb

## 2019-06-02 DIAGNOSIS — R350 Frequency of micturition: Secondary | ICD-10-CM | POA: Diagnosis not present

## 2019-06-02 MED ORDER — CIPROFLOXACIN HCL 500 MG PO TABS: 500.0000 mg | ORAL_TABLET | Freq: Two times a day (BID) | ORAL | 0 refills | Status: AC

## 2019-06-02 NOTE — Patient Instructions (Signed)

## 2019-06-02 NOTE — Progress Notes (Signed)
Subjective:    Patient ID: Rebekah Pace, female    DOB: 1956-02-20, 63 y.o.   MRN: [id number]  HPI 63 y.o. WF with history of bipolar, depression, obesity presents with UTI x 3-4 days.  She has been having frequency, with a pain/pressure after emptying her bladder, she also states she has had some blood on the tissue paper after wiping. Some right lower back pain.  She denies chills, fever, flank pain, hematuria,  and urinary incontinence. No vaginal discharge, no nausea.    Blood pressure 118/76, pulse 88, temperature (!) 97.5 F (36.4 C), height 5' 4.5" (1.638 m), weight 170 lb (77.1 kg), SpO2 99 %.  Medications Current Outpatient Medications on File Prior to Visit  Medication Sig  . ARIPiprazole (ABILIFY) 10 MG tablet Take 10 mg by mouth daily.  Marland Kitchen levothyroxine (SYNTHROID, LEVOTHROID) 75 MCG tablet Take 1 tablet daily on an empty stomach with only water for 30 minutes & no Antacid meds, Calcium or  Magnesium  for 4 hours & avoid Biotin supplements  . MAGNESIUM PO Take 500 mg by mouth.   Marland Kitchen OVER THE COUNTER MEDICATION   . rosuvastatin (CRESTOR) 10 MG tablet Take 1 tablet (10 mg total) by mouth at bedtime.  . traZODone (DESYREL) 50 MG tablet 1/2-1 tablet for sleep  . VITAMIN D, CHOLECALCIFEROL, PO Take 5,000 Units by mouth 2 (two) times daily.    No current facility-administered medications on file prior to visit.     Problem list She has Schizoaffective disorder, bipolar type ([place]); Major depression in full remission ([place]); Labile hypertension; Medication management; Mixed hyperlipidemia; Vitamin D deficiency; Bipolar affective disorder, depressed, severe, with psychotic behavior ([place]); Hypothyroidism; Obesity (BMI 30.0-34.9); HCV (hepatitis C virus); Gout; Hepatic fibrosis; and Hematuria, microscopic on their problem list.   Review of Systems  Constitutional: Negative for chills.  HENT: Negative.   Respiratory: Negative.   Cardiovascular: Negative.   Gastrointestinal:  Negative.  Negative for nausea and vomiting.  Genitourinary: Positive for dysuria, frequency and urgency. Negative for decreased urine volume, difficulty urinating, dyspareunia, enuresis, flank pain, genital sores, hematuria, menstrual problem, pelvic pain, vaginal bleeding and vaginal discharge.       Objective:   Physical Exam Constitutional:      Appearance: She is well-developed.  Neck:     Musculoskeletal: Normal range of motion and neck supple.  Cardiovascular:     Rate and Rhythm: Normal rate and regular rhythm.  Pulmonary:     Effort: Pulmonary effort is normal.     Breath sounds: Normal breath sounds.  Abdominal:     General: Bowel sounds are normal. There is no distension.     Palpations: Abdomen is soft. There is no mass.     Tenderness: There is abdominal tenderness. There is no guarding or rebound.  Musculoskeletal: Normal range of motion.        General: No tenderness.  Skin:    General: Skin is warm and dry.  Neurological:     Mental Status: She is alert and oriented to person, place, and time.           Assessment & Plan:   Urinary frequency -     Urinalysis, Routine w reflex microscopic -     Urine Culture -     ciprofloxacin (CIPRO) 500 MG tablet; Take 1 tablet (500 mg total) by mouth 2 (two) times daily for 5 days.  Medications: ciprofloxacin. Maintain adequate hydration. Follow up if symptoms not improving, and as needed.

## 2019-06-03 LAB — URINE CULTURE
MICRO NUMBER:: 789298
SPECIMEN QUALITY:: ADEQUATE

## 2019-06-03 LAB — URINALYSIS, ROUTINE W REFLEX MICROSCOPIC
Bacteria, UA: NONE SEEN /HPF
Bilirubin Urine: NEGATIVE
Glucose, UA: NEGATIVE
Hyaline Cast: NONE SEEN /LPF
Ketones, ur: NEGATIVE
Nitrite: NEGATIVE
Protein, ur: NEGATIVE
RBC / HPF: NONE SEEN /HPF (ref 0–2)
Specific Gravity, Urine: 1.004 (ref 1.001–1.03)
Squamous Epithelial / LPF: NONE SEEN /HPF (ref <=5)
pH: 6 (ref 5.0–8.0)

## 2019-06-22 ENCOUNTER — Other Ambulatory Visit: Payer: Self-pay | Admitting: *Deleted

## 2019-06-22 MED ORDER — LEVOTHYROXINE SODIUM 75 MCG PO TABS: ORAL_TABLET | ORAL | 0 refills | Status: DC

## 2019-07-19 ENCOUNTER — Other Ambulatory Visit: Payer: Self-pay | Admitting: Physician Assistant

## 2019-07-29 NOTE — Progress Notes (Signed)
Patient ID: Rebekah Pace, female   DOB: 1956/01/21, 63 y.o.   MRN: [id number]   follow up  Assessment:    Labile hypertension - continue medications, DASH diet, exercise and monitor at home. Call if greater than 130/80.  -     CBC with Differential/Platelet -     BASIC METABOLIC PANEL WITH GFR -     Hepatic function panel -     TSH -     Urinalysis, Routine w reflex microscopic -     Urine Culture  Schizoaffective disorder, bipolar type ([place]) Continue follow up psych  Recurrent major depressive disorder, in full remission ([place]) Continue follow up psych  Medication management -     Magnesium  Mixed hyperlipidemia -continue medications, check lipids, decrease fatty foods, increase activity.  -     Lipid panel  Bipolar affective disorder, depressed, severe, with psychotic behavior ([place]) Continue follow up psych  Vitamin D deficiency -     VITAMIN D 25 Hydroxy (Vit-D Deficiency, Fractures)  Hypothyroidism, unspecified type Hypothyroidism-check TSH level, continue medications the same, reminded to take on an empty stomach 30-45mins before food.   BMI 29.0-29.9,adult  Overweight  - long discussion about weight loss, diet, and exercise -recommended diet heavy in fruits and veggies and low in animal meats, cheeses, and dairy products - continue doing a great job with weight loss!!    Future Appointments  Date Time Provider [address]  11/15/2019  2:00 PM Unk Pinto, MD [facility] None  02/14/2020 11:15 AM Liane Comber, NP [facility] None      Subjective:   Rebekah Pace, 63 y.o. DWF,  presents for follow up  for follow up with Hypertension, Hyperlipidemia, Pre-Diabetes, Hypothyroidism  and Vitamin D Deficiency.   In the spring of 2013 she had a psychotic break, has had several psych admissions, Dx'd Bipolar Manic Depressive. Currently she is followed by [facility] by a staff therapist & Psychiatrist to manage her psychotropic  medications. She is very upset, her mom is 3 with colon cancer, has decided to not have surgery, I have given her information about hospice and told to talk with her mom's primary care/   She will have nocturia x 3, but no urinary urgency, AB discomfort, hematuria, or dysuria. She does snore at night but she wakes up rested. She states she does drink liquids within 2 hours of bed, does caffiiene after 12 as well.   Patient has hx/o labile HTN circa 2005 and BP has been controlled and today's BP: 126/80  Has not been walking as much, will start back, denies any cardiac type chest pain, palpitations, dyspnea/orthopnea/PND, dizziness, claudication, or dependent edema.   She is on Crestor 40mg  1/2 tablet daily. Patient denies myalgias or other med SE's. Last Lipids were at goal with  Lab Results  Component Value Date   CHOL 123 04/26/2019   HDL 47 (L) 04/26/2019   LDLCALC 59 04/26/2019   TRIG 87 04/26/2019   CHOLHDL 2.6 04/26/2019   Also, the patient has history of Insulin Resistance with A1c 5.5% and elevated Insulin level 35 in Jan 2014 and has had no symptoms of reactive hypoglycemia, diabetic polys, paresthesias or visual blurring.   Lab Results  Component Value Date   HGBA1C 5.1 04/26/2019   Patient has been on thyroid replacement circa 2005 and remained in therapeutic range on routine monitoring.  Lab Results  Component Value Date   TSH 0.69 04/26/2019   Further,  the patient also has history of Vitamin D Deficiency of 82 in June 2015 and she is not currently on Vit d supplements. Lab Results  Component Value Date   VD25OH 70 04/26/2019   BMI is Body mass index is 30.08 kg/m., she is working on diet and exercise. She is walking daily.  Wt Readings from Last 3 Encounters:  08/02/19 178 lb (80.7 kg)  06/02/19 170 lb (77.1 kg)  04/26/19 172 lb (78 kg)   She is on thyroid medication. Her medication was not changed last visit.   Lab Results  Component Value Date   TSH 0.69  04/26/2019  .   Medication Review: Current Outpatient Medications on File Prior to Visit  Medication Sig  . ARIPiprazole (ABILIFY) 10 MG tablet Take 10 mg by mouth daily.  Marland Kitchen levothyroxine (SYNTHROID) 75 MCG tablet Take 1 tablet daily on an empty stomach with only water for 30 minutes & no Antacid meds, Calcium or  Magnesium  for 4 hours & avoid Biotin supplements  . MAGNESIUM PO Take 500 mg by mouth.   Marland Kitchen OVER THE COUNTER MEDICATION   . rosuvastatin (CRESTOR) 10 MG tablet TAKE 1 TABLET(10 MG) BY MOUTH AT BEDTIME  . traZODone (DESYREL) 50 MG tablet 1/2-1 tablet for sleep  . VITAMIN D, CHOLECALCIFEROL, PO Take 5,000 Units by mouth 2 (two) times daily.    No current facility-administered medications on file prior to visit.     Current Problems (verified) Patient Active Problem List   Diagnosis Date Noted  . Hematuria, microscopic 07/31/2018  . Hepatic fibrosis 11/23/2016  . Gout 05/17/2016  . HCV (hepatitis C virus) 01/31/2016  . Obesity (BMI 30.0-34.9) 07/24/2015  . Hypothyroidism 12/13/2014  . Mixed hyperlipidemia 09/01/2014  . Vitamin D deficiency 09/01/2014  . Bipolar affective disorder, depressed, severe, with psychotic behavior ([place]) 09/01/2014  . Medication management 03/30/2014  . Major depression in full remission ([place])   . Labile hypertension   . Schizoaffective disorder, bipolar type ([place]) 09/12/2011     Objective:     BP 126/80   Pulse 64   Temp 97.6 F (36.4 C)   Wt 178 lb (80.7 kg)   SpO2 98%   BMI 30.08 kg/m   General Appearance: Well nourished, alert, WD/WN, female and in no apparent distress. Eyes: PERRLA, EOMs, conjunctiva no swelling or erythema, normal fundi and vessels. Sinuses: No frontal/maxillary tenderness ENT/Mouth: EACs patent / TMs  nl. Nares clear without erythema, swelling, mucoid exudates. Oral hygiene is good. No erythema, swelling, or exudate. Tongue normal, non-obstructing. Tonsils not swollen or erythematous. Hearing normal.  Neck:  Supple, thyroid normal. No bruits, nodes or JVD. Respiratory: Respiratory effort normal.  BS equal and clear bilateral without rales, rhonci, wheezing or stridor. Cardio: Heart sounds are normal with regular rate and rhythm and no murmurs, rubs or gallops. Peripheral pulses are normal and equal bilaterally without edema. No aortic or femoral bruits. Chest: symmetric with normal excursions and percussion. Breasts: declines  Abdomen: Flat, soft  with nl bowel sounds. Diffuse tenderness, no guarding, rebound, hernias, masses, or organomegaly.  No CVA tendernss Lymphatics: Non tender without lymphadenopathy.  Genitourinary:  Musculoskeletal: Full ROM all peripheral extremities, joint stability, 5/5 strength, and normal gait. Skin: Warm and dry without rashes, lesions, cyanosis, clubbing or  ecchymosis.  Neuro: Cranial nerves intact, reflexes equal bilaterally. Normal muscle tone, no cerebellar symptoms. Sensation intact.  Pysch: Alert and oriented X 3, normal affect, Insight and Judgment appropriate.    Vicie Mutters, PA-C  08/02/2019  

## 2019-08-02 ENCOUNTER — Ambulatory Visit (INDEPENDENT_AMBULATORY_CARE_PROVIDER_SITE_OTHER): Payer: PPO | Admitting: Physician Assistant

## 2019-08-02 ENCOUNTER — Encounter: Payer: Self-pay | Admitting: Physician Assistant

## 2019-08-02 ENCOUNTER — Other Ambulatory Visit: Payer: Self-pay

## 2019-08-02 VITALS — BP 126/80 | HR 64 | Temp 97.6°F | Wt 178.0 lb

## 2019-08-02 DIAGNOSIS — E782 Mixed hyperlipidemia: Secondary | ICD-10-CM

## 2019-08-02 DIAGNOSIS — R0989 Other specified symptoms and signs involving the circulatory and respiratory systems: Secondary | ICD-10-CM | POA: Diagnosis not present

## 2019-08-02 DIAGNOSIS — E559 Vitamin D deficiency, unspecified: Secondary | ICD-10-CM

## 2019-08-02 DIAGNOSIS — E039 Hypothyroidism, unspecified: Secondary | ICD-10-CM

## 2019-08-02 DIAGNOSIS — Z23 Encounter for immunization: Secondary | ICD-10-CM | POA: Diagnosis not present

## 2019-08-02 DIAGNOSIS — F250 Schizoaffective disorder, bipolar type: Secondary | ICD-10-CM | POA: Diagnosis not present

## 2019-08-02 DIAGNOSIS — Z79899 Other long term (current) drug therapy: Secondary | ICD-10-CM | POA: Diagnosis not present

## 2019-08-02 DIAGNOSIS — F315 Bipolar disorder, current episode depressed, severe, with psychotic features: Secondary | ICD-10-CM

## 2019-08-02 NOTE — Patient Instructions (Addendum)
Your ears and sinuses are connected by the eustachian tube. When your sinuses are inflamed, this can close off the tube and cause fluid to collect in your middle ear. This can then cause dizziness, popping, clicking, ringing, and echoing in your ears. This is often NOT an infection and does NOT require antibiotics, it is caused by inflammation so the treatments help the inflammation. This can take a long time to get better so please be patient.  Here are things you can do to help with this: - Try the Flonase or Nasonex. Remember to spray each nostril twice towards the outer part of your eye.  Do not sniff but instead pinch your nose and tilt your head back to help the medicine get into your sinuses.  The best time to do this is at bedtime.Stop if you get blurred vision or nose bleeds.  -While drinking fluids, pinch and hold nose close and swallow, to help open eustachian tubes to drain fluid behind ear drums. -Please pick one of the over the counter allergy medications below and take it once daily for allergies.  It will also help with fluid behind ear drums. Claritin or loratadine cheapest but likely the weakest  Zyrtec or certizine at night because it can make you sleepy The strongest is allegra or fexafinadine  Cheapest at [employer], [employer], [employer] -can use decongestant over the counter, please do not use if you have high blood pressure or certain heart conditions.   if worsening HA, changes vision/speech, imbalance, weakness go to the ER    Talk with your mom's primary care about [facility] is a care service which can be used by people who are terminally ill and in whom healing is no longer thought possible.  It is meant to help with the two largest fears near the end of life (the fears of dying and of being alone), as well as pain management, and an attempt to allow people to pass away comfortably at home.  Hospice staff:  Administer appropriate pain relief.  Provide  nursing care.  Offer reassurance and support to loved ones and family members.  Provide services to keep people comfortable at home or in a hospice facility. Together, you can see to it that your loved one is not alone during this last and important phase of life. You, your family, and your caregivers help you decide when hospice services should begin. If your condition improves or the disease goes into remission, you can be discharged from the hospice program. You can return to hospice care at a later time if needed. The hospice philosophy recognizes death as the final stage of life. It helps patients continue an alert, pain-free life, and manage symptoms while surrounded by their loved ones. Hospice affirms life without hurrying death. Hospice care treats the person rather than the disease. It emphasizes quality of life with family-centered care. Hospice care involves the patient and family and helps them make decisions.  The care is designed to:  Relieve or decrease pain.  Control other problems.  Provide as much quality time as possible.  Allow people to die with dignity. The goal of hospice care is to offer as high a quality of life as possible during the end of life. In this way, the last days of life may be spent with dignity.  With hospice care, instead of spending the last weeks or months in a hospital, a person is with loved ones in the home or a homelike  setting. About 90 percent of hospice care is provided at home. But hospice is available wherever a person lives, including a nursing home or assisted-living residence. Some residential hospices designed specifically for hospice care also exist. Hospice care is available for many types of terminal illnesses. Hospice services are meant to serve both the patient and family members.  Comfort. In most cases, the individual stays in his or her home or in homelike surroundings instead of in a hospital. The core of hospice is a cooperative  effort by family, friends and a team of professional and volunteer caregivers working together to meet your loved one's needs. This team supplies all necessary medicines and equipment. It works with both the person involved and family members to relieve pain and symptoms.  Support. Individuals enjoy the support of loved ones by receiving much of the basic care from family and friends. A nurse may lead the team and coordinates the day-to-day care. A doctor is also part of the team. Chaplains and social workers are available to counsel the family and their loved one. They make sure emotional, spiritual, and social needs are being met. Trained volunteers perform a wide variety of tasks as needed, such as:  Providing companionship.  Doing light housekeeping.  Preparing meals.  Running errands.  Providing respite for the family.  Improving quality of life. Caring for someone who is dying is emotionally and physically demanding. This can be particularly true for family members who are primary caregivers. But you can take comfort in knowing that hospice is an act of love that can improve the quality of life for all involved. Professionals are often available to tend to the needs of grieving family members as well.  Spiritual Care. Hospice care emphasizes the spiritual needs of you and your family. People differ in their spiritual needs and religious beliefs so spiritual care is individualized to meet the persons' and family's needs. It may include helping you to look at what death means to you, to say good-bye, or to perform a specific religious ceremony or ritual. HOW TO SELECT A PROGRAM Most hospice programs are run by nonprofit, independent organizations. Some are affiliated with hospitals, nursing homes or home health care agencies. Some are for-profit organizations. You can learn about existing hospice programs in your area from your health caregivers. ASK THE FOLLOWING:  What services are  available to the patient?  What services are offered to the family?  Are bereavement services available?  How involved are the family members?  How involved is the doctor?  Who makes up the hospice care team? How are they trained or screened?  How will the individual's pain and symptoms be managed?  If circumstances change, can services be provided in different settings, such as the home or the hospital?  Is the program reviewed and licensed by the state or certified in some other way?  Are all costs covered by insurance? How much you pay for hospice care can vary greatly. It depends on the length and type of care necessary and your insurance coverage. Medicare and most private insurance plans, including managed care organizations, cover hospice care. Hospice is also covered by Medicaid in most states. Some hospice programs provide services on a sliding fee scale, based on your ability to pay. They may also provide some durable medical equipment for support within the home.

## 2019-08-03 LAB — COMPLETE METABOLIC PANEL WITH GFR
AG Ratio: 1.6 (calc) (ref 1.0–2.5)
ALT: 16 U/L (ref 6–29)
AST: 16 U/L (ref 10–35)
Albumin: 4.5 g/dL (ref 3.6–5.1)
Alkaline phosphatase (APISO): 82 U/L (ref 37–153)
BUN/Creatinine Ratio: 14 (calc) (ref 6–22)
BUN: 15 mg/dL (ref 7–25)
CO2: 24 mmol/L (ref 20–32)
Calcium: 9.7 mg/dL (ref 8.6–10.4)
Chloride: 105 mmol/L (ref 98–110)
Creat: 1.05 mg/dL — ABNORMAL HIGH (ref 0.50–0.99)
GFR, Est African American: 65 mL/min/{1.73_m2} (ref >=60)
GFR, Est Non African American: 56 mL/min/{1.73_m2} — ABNORMAL LOW (ref >=60)
Globulin: 2.8 g/dL (calc) (ref 1.9–3.7)
Glucose, Bld: 93 mg/dL (ref 65–99)
Potassium: 4.6 mmol/L (ref 3.5–5.3)
Sodium: 140 mmol/L (ref 135–146)
Total Bilirubin: 0.6 mg/dL (ref 0.2–1.2)
Total Protein: 7.3 g/dL (ref 6.1–8.1)

## 2019-08-03 LAB — CBC WITH DIFFERENTIAL/PLATELET
Absolute Monocytes: 371 cells/uL (ref 200–950)
Basophils Absolute: 51 cells/uL (ref 0–200)
Basophils Relative: 0.9 %
Eosinophils Absolute: 120 cells/uL (ref 15–500)
Eosinophils Relative: 2.1 %
HCT: 40.7 % (ref 35.0–45.0)
Hemoglobin: 13.4 g/dL (ref 11.7–15.5)
Lymphs Abs: 1345 cells/uL (ref 850–3900)
MCH: 28.8 pg (ref 27.0–33.0)
MCHC: 32.9 g/dL (ref 32.0–36.0)
MCV: 87.5 fL (ref 80.0–100.0)
MPV: 9.7 fL (ref 7.5–12.5)
Monocytes Relative: 6.5 %
Neutro Abs: 3813 cells/uL (ref 1500–7800)
Neutrophils Relative %: 66.9 %
Platelets: 209 10*3/uL (ref 140–400)
RBC: 4.65 10*6/uL (ref 3.80–5.10)
RDW: 12.1 % (ref 11.0–15.0)
Total Lymphocyte: 23.6 %
WBC: 5.7 10*3/uL (ref 3.8–10.8)

## 2019-08-03 LAB — LIPID PANEL
Cholesterol: 131 mg/dL (ref <200)
HDL: 57 mg/dL (ref >=50)
LDL Cholesterol (Calc): 57 mg/dL (calc)
Non-HDL Cholesterol (Calc): 74 mg/dL (calc) (ref <130)
Total CHOL/HDL Ratio: 2.3 (calc) (ref <5.0)
Triglycerides: 90 mg/dL (ref <150)

## 2019-08-03 LAB — MAGNESIUM: Magnesium: 2 mg/dL (ref 1.5–2.5)

## 2019-08-03 LAB — VITAMIN D 25 HYDROXY (VIT D DEFICIENCY, FRACTURES): Vit D, 25-Hydroxy: 75 ng/mL (ref 30–100)

## 2019-08-03 LAB — TSH: TSH: 1.02 mIU/L (ref 0.40–4.50)

## 2019-09-17 ENCOUNTER — Other Ambulatory Visit: Payer: Self-pay

## 2019-09-17 MED ORDER — LEVOTHYROXINE SODIUM 75 MCG PO TABS: ORAL_TABLET | ORAL | 0 refills | Status: DC

## 2019-10-19 ENCOUNTER — Other Ambulatory Visit: Payer: Self-pay | Admitting: Internal Medicine

## 2019-10-19 DIAGNOSIS — K7469 Other cirrhosis of liver: Secondary | ICD-10-CM

## 2019-10-19 DIAGNOSIS — Z79899 Other long term (current) drug therapy: Secondary | ICD-10-CM

## 2019-10-20 ENCOUNTER — Other Ambulatory Visit: Payer: Self-pay | Admitting: Nurse Practitioner

## 2019-10-20 DIAGNOSIS — K7469 Other cirrhosis of liver: Secondary | ICD-10-CM

## 2019-11-08 ENCOUNTER — Ambulatory Visit
Admission: RE | Admit: 2019-11-08 | Discharge: 2019-11-08 | Disposition: A | Discharge status: Home / Self Care | Payer: PPO | Source: Ambulatory Visit | Attending: Nurse Practitioner | Admitting: Nurse Practitioner

## 2019-11-08 DIAGNOSIS — K7460 Unspecified cirrhosis of liver: Secondary | ICD-10-CM | POA: Diagnosis not present

## 2019-11-08 DIAGNOSIS — K7469 Other cirrhosis of liver: Secondary | ICD-10-CM

## 2019-11-08 DIAGNOSIS — B181 Chronic viral hepatitis B without delta-agent: Secondary | ICD-10-CM | POA: Diagnosis not present

## 2019-11-12 ENCOUNTER — Other Ambulatory Visit: Payer: Self-pay | Admitting: Internal Medicine

## 2019-11-12 ENCOUNTER — Telehealth: Payer: Self-pay | Admitting: *Deleted

## 2019-11-12 ENCOUNTER — Other Ambulatory Visit: Payer: Self-pay | Admitting: *Deleted

## 2019-11-12 MED ORDER — BISOPROLOL-HYDROCHLOROTHIAZIDE 10-6.25 MG PO TABS: ORAL_TABLET | ORAL | 1 refills | Status: DC

## 2019-11-12 NOTE — Telephone Encounter (Signed)
Patient called and reported her BP was elevated today at 166/89, at a different doctor's office,  She states she is under a lot of stress, due to her mother is dying. An RX for Ziac 10-6.25 mg tablet sent to her pharmacy and the patient was advised to start with 12 table daily. The patient is aware and has a physical appointment with Dr Melford Aase on 11/15/2019.

## 2019-11-14 ENCOUNTER — Encounter: Payer: Self-pay | Admitting: Internal Medicine

## 2019-11-14 DIAGNOSIS — R7309 Other abnormal glucose: Secondary | ICD-10-CM | POA: Insufficient documentation

## 2019-11-14 NOTE — Patient Instructions (Signed)

## 2019-11-14 NOTE — Progress Notes (Signed)
Annual Screening/Preventative Visit & Comprehensive Evaluation &  Examination     This very nice 64 y.o.  DWF presents for a Screening /Preventative Visit & comprehensive evaluation and management of multiple medical co-morbidities.  Patient has been followed for HTN, HLD, Prediabetes  and Vitamin D Deficiency.     Patient has been on SS Disability since 2013 for Bipolar Manic Depressive Disorder with hx/o Psychosis. She has been stable on Abilify. She also has NAFLD & Hepatic Fibrosis. She has hx/o Hepatitis C -  s/p cure and is followed at the Hepatitis clinic by Dr Zollie Scale & Roosevelt Locks, NP.       Labile HTN followed expectantly predates since 2005 and 3-4 days ago she called with elevated BP's & was started on Rx Ziac-10 x 1/2 tab and BP is normal today. Patient denies any cardiac symptoms as chest pain, palpitations, shortness of breath, dizziness or ankle swelling. Today's BP is at goal - 124/68.      Patient's hyperlipidemia is controlled with diet and Rosuvastatin.  Patient denies myalgias or other medication SE's. Last lipids were at goal:  Lab Results  Component Value Date   CHOL 131 08/02/2019   HDL 57 08/02/2019   LDLCALC 57 08/02/2019   TRIG 90 08/02/2019   CHOLHDL 2.3 08/02/2019       Patient has hx/o prediabetes/Insulin Resistance  and patient denies reactive hypoglycemic symptoms, visual blurring, diabetic polys or paresthesias. Last A1c was Normal & at goal:  Lab Results  Component Value Date   HGBA1C 5.1 04/26/2019      In 2002, patient was dx'd Hypothyroid and initiated on Thyroid Replacement.      Finally, patient has history of Vitamin D Deficiency (18" / 2017 and"29" / 2015)and last Vitamin D was at goal:  Lab Results  Component Value Date   VD25OH 67 08/02/2019    Current Outpatient Medications on File Prior to Visit  Medication Sig  . ARIPiprazole (ABILIFY) 10 MG tablet Take 10 mg by mouth daily.  . bisoprolol-hydrochlorothiazide (ZIAC) 10-6.25 MG  tablet Take 1 tablet every Morning for BP  . levothyroxine (SYNTHROID) 75 MCG tablet Take 1 tablet daily on an empty stomach with only water for 30 minutes & no Antacid meds, Calcium or  Magnesium  for 4 hours & avoid Biotin supplements  . MAGNESIUM PO Take 500 mg by mouth.   Marland Kitchen OVER THE COUNTER MEDICATION   . rosuvastatin (CRESTOR) 10 MG tablet TAKE 1 TABLET(10 MG) BY MOUTH AT BEDTIME  . traZODone (DESYREL) 50 MG tablet 1/2-1 tablet for sleep  . VITAMIN D, CHOLECALCIFEROL, PO Take 5,000 Units by mouth 2 (two) times daily.    No current facility-administered medications on file prior to visit.    Allergies  Allergen Reactions  . Sulfa Antibiotics Rash    Past Medical History:  Diagnosis Date  . Anxiety   . Depression   . Gout 05/17/2016  . Hepatitis   . Hypothyroidism   . Labile hypertension   . Mental disorder   . Prediabetes   . Thyroid disease     Health Maintenance  Topic Date Due  . PAP SMEAR-Modifier  07/18/2018  . MAMMOGRAM  11/11/2018  . TETANUS/TDAP  01/27/2020 (Originally 10/14/2005)  . Fecal DNA (Cologuard)  06/24/2020  . INFLUENZA VACCINE  Completed  . Hepatitis C Screening  Completed  . HIV Screening  Completed    Immunization History  Administered Date(s) Administered  . Influenza Inj Mdck Quad With Preservative 08/02/2019  .  Pneumococcal-Unspecified 10/14/2001  . Td 10/15/1995    Cologard - Negative - 06/24/2017 and recc 3 year f/u due Sept 2021  Last MGM - 11/11/2017 - patient aware she's overdue and avows to schedule  Past Surgical History:  Procedure Laterality Date  . arm fracture    . ROTATOR CUFF REPAIR      Family History  Problem Relation Age of Onset  . Hyperlipidemia Mother   . Hypertension Mother   . Colon polyps Mother   . Cancer Father        Lung  . Hyperlipidemia Brother     Social History   Tobacco Use  . Smoking status: Never Smoker  . Smokeless tobacco: Never Used  Substance Use Topics  . Alcohol use: No  . Drug  use: No     ROS Constitutional: Denies fever, chills, weight loss/gain, headaches, insomnia,  night sweats, and change in appetite. Does c/o fatigue. Eyes: Denies redness, blurred vision, diplopia, discharge, itchy, watery eyes.  ENT: Denies discharge, congestion, post nasal drip, epistaxis, sore throat, earache, hearing loss, dental pain, Tinnitus, Vertigo, Sinus pain, snoring.  Cardio: Denies chest pain, palpitations, irregular heartbeat, syncope, dyspnea, diaphoresis, orthopnea, PND, claudication, edema Respiratory: denies cough, dyspnea, DOE, pleurisy, hoarseness, laryngitis, wheezing.  Gastrointestinal: Denies dysphagia, heartburn, reflux, water brash, pain, cramps, nausea, vomiting, bloating, diarrhea, constipation, hematemesis, melena, hematochezia, jaundice, hemorrhoids Genitourinary: Denies dysuria, frequency, urgency, nocturia, hesitancy, discharge, hematuria, flank pain Breast: Breast lumps, nipple discharge, bleeding.  Musculoskeletal: Denies arthralgia, myalgia, stiffness, Jt. Swelling, pain, limp, and strain/sprain. Denies falls. Skin: Denies puritis, rash, hives, warts, acne, eczema, changing in skin lesion Neuro: No weakness, tremor, incoordination, spasms, paresthesia, pain Psychiatric: Denies confusion, memory loss, sensory loss. Denies Depression. Endocrine: Denies change in weight, skin, hair change, nocturia, and paresthesia, diabetic polys, visual blurring, hyper / hypo glycemic episodes.  Heme/Lymph: No excessive bleeding, bruising, enlarged lymph nodes.  Physical Exam  BP 124/68   Pulse 68   Temp (!) 97.2 F (36.2 C)   Resp 16   Ht 5\' 4"  (1.626 m)   Wt 183 lb 9.6 oz (83.3 kg)   BMI 31.51 kg/m   General Appearance: Well nourished, well groomed and in no apparent distress.  Eyes: PERRLA, EOMs, conjunctiva no swelling or erythema, normal fundi and vessels. Sinuses: No frontal/maxillary tenderness ENT/Mouth: EACs patent / TMs  nl. Nares clear without erythema,  swelling, mucoid exudates. Oral hygiene is good. No erythema, swelling, or exudate. Tongue normal, non-obstructing. Tonsils not swollen or erythematous. Hearing normal.  Neck: Supple, thyroid not palpable. No bruits, nodes or JVD. Respiratory: Respiratory effort normal.  BS equal and clear bilateral without rales, rhonci, wheezing or stridor. Cardio: Heart sounds are normal with regular rate and rhythm and no murmurs, rubs or gallops. Peripheral pulses are normal and equal bilaterally without edema. No aortic or femoral bruits. Chest: symmetric with normal excursions and percussion. Breasts: Symmetric, without lumps, nipple discharge, retractions, or fibrocystic changes.  Abdomen: Flat, soft with bowel sounds active. Nontender, no guarding, rebound, hernias, masses, or organomegaly.  Lymphatics: Non tender without lymphadenopathy.  Musculoskeletal: Full ROM all peripheral extremities, joint stability, 5/5 strength, and normal gait. Skin: Warm and dry without rashes, lesions, cyanosis, clubbing or  ecchymosis.  Neuro: Cranial nerves intact, reflexes equal bilaterally. Normal muscle tone, no cerebellar symptoms. Sensation intact.  Pysch: Alert and oriented X 3, normal affect, Insight and Judgment appropriate.   Assessment and Plan  1. Annual Preventative Screening Examination  2. Essential hypertension  - EKG  12-Lead - Urinalysis, Routine w reflex microscopic - Microalbumin / creatinine urine ratio - CBC with Differential/Platelet - COMPLETE METABOLIC PANEL WITH GFR - Magnesium - TSH  3. Hyperlipidemia, mixed  - EKG 12-Lead - Lipid panel - TSH  4. Abnormal glucose  - EKG 12-Lead - Hemoglobin A1c - Insulin, random  5. Vitamin D deficiency  - VITAMIN D 25 Hydroxy   6. Hypothyroidism  - TSH  7. Schizoaffective disorder, bipolar type ([place])   8. Chronic hepatitis C without hepatic coma (H[facility])  - AFP tumor marker  9. Hepatic fibrosis  - AFP tumor marker  10. NAFLD  (nonalcoholic fatty liver disease)  - COMPLETE METABOLIC PANEL WITH GFR - AFP tumor marker  11. Screening for colorectal cancer  - POC Hemoccult Bld/Stl (3-Cd Home Screen); Future  12. Stage 3 chronic kidney disease, unspecified whether stage 3a or 3b CKD  - COMPLETE METABOLIC PANEL WITH GFR  13. Screening for ischemic heart disease  - EKG 12-Lead  14. FH: hypertension  - EKG 12-Lead  15. Anticoagulation monitoring, for chronic Liver disease  - Protime-INR  16. Medication management  - Urinalysis, Routine w reflex microscopic - Microalbumin / creatinine urine ratio - CBC with Differential/Platelet - COMPLETE METABOLIC PANEL WITH GFR - Magnesium - Lipid panel - TSH - Hemoglobin A1c - Insulin, random - VITAMIN D 25 Hydroxy        Patient was counseled in prudent diet to achieve/maintain BMI less than 25 for weight control, BP monitoring, regular exercise and medications. Discussed med's effects and SE's. Screening labs and tests as requested with regular follow-up as recommended. Over 40 minutes of exam, counseling, chart review and high complex critical decision making was performed.   Kirtland Bouchard, MD

## 2019-11-15 ENCOUNTER — Other Ambulatory Visit: Payer: Self-pay

## 2019-11-15 ENCOUNTER — Ambulatory Visit (INDEPENDENT_AMBULATORY_CARE_PROVIDER_SITE_OTHER): Payer: PPO | Admitting: Internal Medicine

## 2019-11-15 VITALS — BP 124/68 | HR 68 | Temp 97.2°F | Resp 16 | Ht 64.0 in | Wt 183.6 lb

## 2019-11-15 DIAGNOSIS — I10 Essential (primary) hypertension: Secondary | ICD-10-CM | POA: Diagnosis not present

## 2019-11-15 DIAGNOSIS — K760 Fatty (change of) liver, not elsewhere classified: Secondary | ICD-10-CM | POA: Diagnosis not present

## 2019-11-15 DIAGNOSIS — N183 Chronic kidney disease, stage 3 unspecified: Secondary | ICD-10-CM

## 2019-11-15 DIAGNOSIS — E039 Hypothyroidism, unspecified: Secondary | ICD-10-CM

## 2019-11-15 DIAGNOSIS — Z79899 Other long term (current) drug therapy: Secondary | ICD-10-CM

## 2019-11-15 DIAGNOSIS — R7309 Other abnormal glucose: Secondary | ICD-10-CM

## 2019-11-15 DIAGNOSIS — Z1211 Encounter for screening for malignant neoplasm of colon: Secondary | ICD-10-CM

## 2019-11-15 DIAGNOSIS — K7400 Hepatic fibrosis, unspecified: Secondary | ICD-10-CM

## 2019-11-15 DIAGNOSIS — B182 Chronic viral hepatitis C: Secondary | ICD-10-CM | POA: Diagnosis not present

## 2019-11-15 DIAGNOSIS — E782 Mixed hyperlipidemia: Secondary | ICD-10-CM

## 2019-11-15 DIAGNOSIS — F250 Schizoaffective disorder, bipolar type: Secondary | ICD-10-CM

## 2019-11-15 DIAGNOSIS — Z7901 Long term (current) use of anticoagulants: Secondary | ICD-10-CM

## 2019-11-15 DIAGNOSIS — Z136 Encounter for screening for cardiovascular disorders: Secondary | ICD-10-CM

## 2019-11-15 DIAGNOSIS — Z0000 Encounter for general adult medical examination without abnormal findings: Secondary | ICD-10-CM

## 2019-11-15 DIAGNOSIS — Z8249 Family history of ischemic heart disease and other diseases of the circulatory system: Secondary | ICD-10-CM

## 2019-11-15 DIAGNOSIS — E559 Vitamin D deficiency, unspecified: Secondary | ICD-10-CM | POA: Diagnosis not present

## 2019-11-15 DIAGNOSIS — Z0001 Encounter for general adult medical examination with abnormal findings: Secondary | ICD-10-CM

## 2019-11-16 LAB — TSH: TSH: 1.11 mIU/L (ref 0.40–4.50)

## 2019-11-16 LAB — HEMOGLOBIN A1C
Hgb A1c MFr Bld: 5.1 % of total Hgb (ref <5.7)
Mean Plasma Glucose: 100 (calc)
eAG (mmol/L): 5.5 (calc)

## 2019-11-16 LAB — CBC WITH DIFFERENTIAL/PLATELET
Absolute Monocytes: 544 cells/uL (ref 200–950)
Basophils Absolute: 80 cells/uL (ref 0–200)
Basophils Relative: 1 %
Eosinophils Absolute: 200 cells/uL (ref 15–500)
Eosinophils Relative: 2.5 %
HCT: 40.3 % (ref 35.0–45.0)
Hemoglobin: 13.6 g/dL (ref 11.7–15.5)
Lymphs Abs: 1904 cells/uL (ref 850–3900)
MCH: 29.4 pg (ref 27.0–33.0)
MCHC: 33.7 g/dL (ref 32.0–36.0)
MCV: 87.2 fL (ref 80.0–100.0)
MPV: 10 fL (ref 7.5–12.5)
Monocytes Relative: 6.8 %
Neutro Abs: 5272 cells/uL (ref 1500–7800)
Neutrophils Relative %: 65.9 %
Platelets: 216 10*3/uL (ref 140–400)
RBC: 4.62 10*6/uL (ref 3.80–5.10)
RDW: 12.5 % (ref 11.0–15.0)
Total Lymphocyte: 23.8 %
WBC: 8 10*3/uL (ref 3.8–10.8)

## 2019-11-16 LAB — URINALYSIS, ROUTINE W REFLEX MICROSCOPIC
Bacteria, UA: NONE SEEN /HPF
Bilirubin Urine: NEGATIVE
Glucose, UA: NEGATIVE
Hyaline Cast: NONE SEEN /LPF
Ketones, ur: NEGATIVE
Nitrite: NEGATIVE
Protein, ur: NEGATIVE
Specific Gravity, Urine: 1.006 (ref 1.001–1.03)
Squamous Epithelial / HPF: NONE SEEN /HPF (ref <=5)
pH: 6 (ref 5.0–8.0)

## 2019-11-16 LAB — COMPLETE METABOLIC PANEL WITH GFR
AG Ratio: 1.8 (calc) (ref 1.0–2.5)
ALT: 14 U/L (ref 6–29)
AST: 16 U/L (ref 10–35)
Albumin: 4.6 g/dL (ref 3.6–5.1)
Alkaline phosphatase (APISO): 67 U/L (ref 37–153)
BUN/Creatinine Ratio: 14 (calc) (ref 6–22)
BUN: 15 mg/dL (ref 7–25)
CO2: 29 mmol/L (ref 20–32)
Calcium: 10 mg/dL (ref 8.6–10.4)
Chloride: 103 mmol/L (ref 98–110)
Creat: 1.06 mg/dL — ABNORMAL HIGH (ref 0.50–0.99)
GFR, Est African American: 64 mL/min/{1.73_m2} (ref >=60)
GFR, Est Non African American: 55 mL/min/{1.73_m2} — ABNORMAL LOW (ref >=60)
Globulin: 2.6 g/dL (calc) (ref 1.9–3.7)
Glucose, Bld: 98 mg/dL (ref 65–99)
Potassium: 4.4 mmol/L (ref 3.5–5.3)
Sodium: 139 mmol/L (ref 135–146)
Total Bilirubin: 0.6 mg/dL (ref 0.2–1.2)
Total Protein: 7.2 g/dL (ref 6.1–8.1)

## 2019-11-16 LAB — AFP TUMOR MARKER: AFP-Tumor Marker: 10.6 ng/mL — ABNORMAL HIGH

## 2019-11-16 LAB — PROTIME-INR
INR: 1.1
Prothrombin Time: 11 s (ref 9.0–11.5)

## 2019-11-16 LAB — LIPID PANEL
Cholesterol: 118 mg/dL (ref <200)
HDL: 44 mg/dL — ABNORMAL LOW (ref >=50)
LDL Cholesterol (Calc): 51 mg/dL (calc)
Non-HDL Cholesterol (Calc): 74 mg/dL (calc) (ref <130)
Total CHOL/HDL Ratio: 2.7 (calc) (ref <5.0)
Triglycerides: 156 mg/dL — ABNORMAL HIGH (ref <150)

## 2019-11-16 LAB — INSULIN, RANDOM: Insulin: 17.3 u[IU]/mL

## 2019-11-16 LAB — MICROALBUMIN / CREATININE URINE RATIO
Creatinine, Urine: 53 mg/dL (ref 20–275)
Microalb Creat Ratio: 19 mcg/mg creat (ref <30)
Microalb, Ur: 1 mg/dL

## 2019-11-16 LAB — MAGNESIUM: Magnesium: 2.3 mg/dL (ref 1.5–2.5)

## 2019-11-16 LAB — VITAMIN D 25 HYDROXY (VIT D DEFICIENCY, FRACTURES): Vit D, 25-Hydroxy: 68 ng/mL (ref 30–100)

## 2019-11-17 ENCOUNTER — Encounter: Payer: Self-pay | Admitting: *Deleted

## 2019-12-13 DIAGNOSIS — K7402 Hepatic fibrosis, advanced fibrosis: Secondary | ICD-10-CM | POA: Diagnosis not present

## 2019-12-16 ENCOUNTER — Other Ambulatory Visit: Payer: Self-pay | Admitting: Internal Medicine

## 2019-12-16 MED ORDER — LEVOTHYROXINE SODIUM 75 MCG PO TABS: ORAL_TABLET | ORAL | 0 refills | Status: DC

## 2019-12-20 ENCOUNTER — Other Ambulatory Visit: Payer: Self-pay

## 2019-12-20 DIAGNOSIS — Z1211 Encounter for screening for malignant neoplasm of colon: Secondary | ICD-10-CM

## 2019-12-20 LAB — POC HEMOCCULT BLD/STL (HOME/3-CARD/SCREEN)
Card #2 Fecal Occult Blod, POC: NEGATIVE
Card #3 Fecal Occult Blood, POC: NEGATIVE
Fecal Occult Blood, POC: NEGATIVE

## 2019-12-21 DIAGNOSIS — Z1212 Encounter for screening for malignant neoplasm of rectum: Secondary | ICD-10-CM | POA: Diagnosis not present

## 2019-12-21 DIAGNOSIS — Z1211 Encounter for screening for malignant neoplasm of colon: Secondary | ICD-10-CM | POA: Diagnosis not present

## 2020-02-11 NOTE — Progress Notes (Deleted)
Patient ID: Rebekah Pace, female   DOB: January 29, 1956, 64 y.o.   MRN: [id number]  Medicare  Annual  Wellness Visit And follow up  Assessment:   Encounter for Medicare annual wellness exam 1 year  Labile hypertension - continue medications, DASH diet, exercise and monitor at home. Call if greater than 130/80.  -     CBC with Differential/Platelet -     CMP/GFR -     TSH  Chronic hepatitis C without hepatic coma (HCC) In remission after epclusa -     CMP/GFR  Hepatic fibrosis Will send labs to GI -send to [facility] liver care fax [phone] Avoid fatty foods -     CMP/GFR  Schizoaffective disorder, bipolar type ([place]) Continue follow up psych  Recurrent major depressive disorder, in full remission ([place]) Continue follow up psych  Medication management -     Magnesium  Mixed hyperlipidemia -continue medications, check lipids, decrease fatty foods, increase activity.  -     Lipid panel  Bipolar affective disorder, depressed, severe, with psychotic behavior ([place]) Continue follow up psych  Vitamin D deficiency At goal at recent check; continue to recommend supplementation for goal of 60-100 Defer vitamin D level  Hypothyroidism, unspecified type Hypothyroidism-check TSH level, continue medications the same, reminded to take on an empty stomach 30-47mins before food.   Obesity - BMI *** - long discussion about weight loss, diet, and exercise -recommended diet heavy in fruits and veggies and low in animal meats, cheeses, and dairy products - ***  Insulin resistance Recent a1cs well controlled; check CMP - patient on abilify  Insomnia Insomnia- good sleep hygiene discussed, increase day time activity -     traZODone (DESYREL) 50 MG tablet; 1/2-1 tablet for sleep    Future Appointments  Date Time Provider [place]  02/14/2020 11:15 AM Liane Comber, NP [facility] None  05/24/2020 10:30 AM Unk Pinto, MD [facility] None  11/17/2020 11:00 AM Unk Pinto, MD  [facility] None    Plan:   During the course of the visit the patient was educated and counseled about appropriate screening and preventive services including:    Pneumococcal vaccine   Influenza vaccine  Td vaccine  Screening electrocardiogram  Bone densitometry screening  Colorectal cancer screening  Diabetes screening  Glaucoma screening  Nutrition counseling   Advanced directives: requested   Subjective:   Rebekah Pace, 64 y.o. DWF,  presents for Medicare Annual Wellness Visit and follow up  for follow up with Hypertension, Hyperlipidemia, Pre-Diabetes, Hypothyroidism  and Vitamin D Deficiency.   DUE PAP ***  She has been following with Dr. Waunita Schooner. Benny Lennert at liver care center for hepatitis C, treated with epclusa and in remission. She has NASH/hepatic fibrosis and is following with them.   She is feeling down today due to her 24 year old mother just being diagnosed with colon cancer. ***  In the spring of 2013 she had a psychotic break, has had several psych admissions, Dx'd Bipolar Manic Depressive. Currently she is followed by [facility] [place] by a staff therapist & Psychiatrist to manage her psychotropic medications. She is on Abilify and reports she feels well with current regimen. She is taking trazodone 50 mg at night for sleep.   Patient has hx/o labile HTN circa 2005 and BP has been controlled and today's  She is walking daily, 40 mins daily and denies any cardiac type chest pain, palpitations, dyspnea/orthopnea/PND, dizziness, claudication, or dependent edema.   BMI is  There is no height or weight on file to calculate BMI., she has been working on diet and exercise, walking 3 days a week.  Wt Readings from Last 3 Encounters:  11/15/19 183 lb 9.6 oz (83.3 kg)  08/02/19 178 lb (80.7 kg)  06/02/19 170 lb (77.1 kg)   She has started the crestor (10 mg daily), she is working hard on diet/exercies. Patient denies myalgias or other  med SE's. Last LDL was at goal with:  Lab Results  Component Value Date   CHOL 118 11/15/2019   HDL 44 (L) 11/15/2019   LDLCALC 51 11/15/2019   TRIG 156 (H) 11/15/2019   CHOLHDL 2.7 11/15/2019   Also, the patient has history of Insulin Resistance with A1c 5.5% and elevated Insulin level 35 in Jan 2014 and has had no symptoms of reactive hypoglycemia, diabetic polys, paresthesias or visual blurring.  Recent A1C was in normal range:  Lab Results  Component Value Date   HGBA1C 5.1 11/15/2019   Patient has been on thyroid replacement circa 2005 and remained in therapeutic range on routine monitoring.  Lab Results  Component Value Date   TSH 1.11 11/15/2019   Further, the patient also has history of Vitamin D Deficiency and is on vitamin D supplement, *** Lab Results  Component Value Date   VD25OH 68 11/15/2019     Medication Review: Current Outpatient Medications on File Prior to Visit  Medication Sig  . ARIPiprazole (ABILIFY) 10 MG tablet Take 10 mg by mouth daily.  . bisoprolol-hydrochlorothiazide (ZIAC) 10-6.25 MG tablet Take 1 tablet every Morning for BP  . levothyroxine (SYNTHROID) 75 MCG tablet Take 1 tablet daily on an empty stomach with only water for 30 minutes & no Antacid meds, Calcium or  Magnesium  for 4 hours & avoid Biotin supplements  . MAGNESIUM PO Take 500 mg by mouth.   Marland Kitchen OVER THE COUNTER MEDICATION   . rosuvastatin (CRESTOR) 10 MG tablet TAKE 1 TABLET(10 MG) BY MOUTH AT BEDTIME  . traZODone (DESYREL) 50 MG tablet 1/2-1 tablet for sleep  . VITAMIN D, CHOLECALCIFEROL, PO Take 5,000 Units by mouth 2 (two) times daily.    No current facility-administered medications on file prior to visit.    Current Problems (verified) Patient Active Problem List   Diagnosis Date Noted  . Abnormal glucose 11/14/2019  . Hematuria, microscopic 07/31/2018  . Hepatic fibrosis 11/23/2016  . Gout 05/17/2016  . HCV (hepatitis C virus) 01/31/2016  . Obesity (BMI 30.0-34.9)  07/24/2015  . Hypothyroidism 12/13/2014  . Hyperlipidemia, mixed 09/01/2014  . Vitamin D deficiency 09/01/2014  . Bipolar affective disorder, depressed, severe, with psychotic behavior ([place]) 09/01/2014  . Medication management 03/30/2014  . Labile hypertension   . Schizoaffective disorder, bipolar type ([place]) 09/12/2011    Screening Tests Immunization History  Administered Date(s) Administered  . Influenza Inj Mdck Quad With Preservative 08/02/2019  . Pneumococcal-Unspecified 10/14/2001  . Td 10/15/1995   Preventative care: Last colonoscopy: 03/2001 by Dr Deatra Ina  Cologuard 06/2017 negative, due 06/2020 *** MGM 10/2017 Cat B *** [place] Ab 09/2015 CXR 2013 PAP 2016, DUE *** today if time, if not schedule sep Ct renal stone 01/2017  Declines vaccines   Names of Other Physician/Practitioners you currently use: 1. [facility] here for primary care 2. Dr Sherlean Foot, OD, eye doctor, last visit 2014 *** 3. Dr. Doreene Burke, dentist, last visit 2019   Patient Care Team: Unk Pinto, MD as PCP - General (Internal Medicine) [place], [place] T,  DPM as Consulting Physician (Podiatry) Inda Castle, MD (Inactive) as Consulting Physician (Gastroenterology) Thalia Bloodgood, [place] as Referring Physician (Optometry) Roosevelt Locks, CRNP as Nurse Practitioner (Nurse Practitioner) Zamor, Ander Gaster, MD as Consulting Physician (Gastroenterology) Roosevelt Locks, CRNP as Nurse Practitioner (Gastroenterology)  Allergies Allergies  Allergen Reactions  . Sulfa Antibiotics Rash    SURGICAL HISTORY She  has a past surgical history that includes arm fracture and Rotator cuff repair. FAMILY HISTORY Her family history includes Cancer in her father; Colon polyps in her mother; Hyperlipidemia in her brother and mother; Hypertension in her mother. SOCIAL HISTORY She  reports that she has never smoked. She has never used smokeless tobacco. She reports that she does not drink  alcohol or use drugs.  MEDICARE WELLNESS OBJECTIVES: Physical activity:   Cardiac risk factors:   Depression/mood screen:   Depression screen [facility] 2/9 11/14/2019  Decreased Interest 0  Down, Depressed, Hopeless 0  PHQ - 2 Score 0    ADLs:  In your present state of health, do you have any difficulty performing the following activities: 11/14/2019 04/25/2019  Hearing? N N  Vision? N N  Difficulty concentrating or making decisions? N N  Walking or climbing stairs? N N  Dressing or bathing? N N  Doing errands, shopping? N N  Some recent data might be hidden    Cognitive Testing  Alert? Yes  Normal Appearance?Yes  Oriented to person? Yes  Place? Yes   Time? Yes  Recall of three objects?  Yes  Can perform simple calculations? Yes  Displays appropriate judgment?Yes  Can read the correct time from a watch face?Yes  EOL planning:     Objective:     There were no vitals taken for this visit.  General Appearance: Well nourished, alert, WD/WN, female and in no apparent distress. Eyes: PERRLA, EOMs, conjunctiva no swelling or erythema, normal fundi and vessels. Sinuses: No frontal/maxillary tenderness ENT/Mouth: EACs patent / TMs  nl. Nares clear without erythema, swelling, mucoid exudates. Oral hygiene is good. No erythema, swelling, or exudate. Tongue normal, non-obstructing. Tonsils not swollen or erythematous. Hearing normal.  Neck: Supple, thyroid normal. No bruits, nodes or JVD. Respiratory: Respiratory effort normal.  BS equal and clear bilateral without rales, rhonci, wheezing or stridor. Cardio: Heart sounds are normal with regular rate and rhythm and no murmurs, rubs or gallops. Peripheral pulses are normal and equal bilaterally without edema. No aortic or femoral bruits. Chest: symmetric with normal excursions and percussion. Abdomen: Flat, soft  with nl bowel sounds. Diffuse tenderness, no guarding, rebound, hernias, masses, or organomegaly.  No CVA tendernss Lymphatics: Non  tender without lymphadenopathy.  Musculoskeletal: Full ROM all peripheral extremities, joint stability, 5/5 strength, and normal gait. Skin: Warm and dry without rashes, lesions, cyanosis, clubbing or  ecchymosis.  Neuro: Cranial nerves intact, reflexes equal bilaterally. Normal muscle tone, no cerebellar symptoms. Sensation intact.  Pysch: Alert and oriented X 3, normal affect, Insight and Judgment appropriate.   Medicare Attestation I have personally reviewed: The patient's medical and social history Their use of alcohol, tobacco or illicit drugs Their current medications and supplements The patient's functional ability including ADLs,fall risks, home safety risks, cognitive, and hearing and visual impairment Diet and physical activities Evidence for depression or mood disorders  The patient's weight, height, BMI, and visual acuity have been recorded in the chart.  I have made referrals, counseling, and provided education to the patient based on review of the above and I have provided the patient with a  written personalized care plan for preventive services.  Over 40 minutes of exam, counseling, chart review was performed.  Izora Ribas, NP   02/11/2020

## 2020-02-14 ENCOUNTER — Ambulatory Visit: Payer: PPO | Admitting: Adult Health

## 2020-03-01 NOTE — Progress Notes (Signed)
Patient ID: Rebekah MORRING, female   DOB: Jun 15, 1956, 64 y.o.   MRN: [id number]  Medicare  Annual  Wellness Visit And follow up  Assessment:   Encounter for Medicare annual wellness exam 1 year Encouraged patient to schedule covid 19 vaccine Given number to schedule mammogram Schedule PAP next OV in August Encouraged to schedule eye exam  Discussed referral for colonoscopy at next OV due to mother cancer history   Labile hypertension - continue medications, DASH diet, exercise and monitor at home. Call if greater than 130/80.  -     CBC with Differential/Platelet -     CMP/GFR -     TSH  Chronic hepatitis C without hepatic coma (HCC) In remission after epclusa -     CMP/GFR  Hepatic fibrosis Will send labs to GI -send to [facility] liver care fax [phone] Avoid fatty foods -     CMP/GFR  Schizoaffective disorder, bipolar type ([place]) Continue follow up psych  Recurrent major depressive disorder, in full remission ([place]) Continue follow up psych  Medication management -     Magnesium  Mixed hyperlipidemia -continue medications, check lipids, decrease fatty foods, increase activity.  -     Lipid panel  Bipolar affective disorder, depressed, severe, with psychotic behavior ([place]) Continue follow up psych  Vitamin D deficiency At goal at recent check; continue to recommend supplementation for goal of 60-100 Defer vitamin D level  Hypothyroidism, unspecified type Hypothyroidism-check TSH level, continue medications the same, reminded to take on an empty stomach 30-7mins before food.   Obesity - BMI 31 - long discussion about weight loss, diet, and exercise -recommended diet heavy in fruits and veggies and low in animal meats, cheeses, and dairy products - discussed weight loss goal of <170 lb  Insulin resistance Recent a1cs well controlled; check CMP - patient on abilify  Insomnia Insomnia- good sleep hygiene discussed, increase day time activity -     traZODone  (DESYREL) 50 MG tablet; 1/2-1 tablet for sleep    Future Appointments  Date Time Provider [place]  06/05/2020 11:15 AM Liane Comber, NP [facility] None  11/17/2020 11:00 AM Unk Pinto, MD [facility] None    Plan:   During the course of the visit the patient was educated and counseled about appropriate screening and preventive services including:    Pneumococcal vaccine   Influenza vaccine  Td vaccine  Screening electrocardiogram  Bone densitometry screening  Colorectal cancer screening  Diabetes screening  Glaucoma screening  Nutrition counseling   Advanced directives: requested   Subjective:   Rebekah Pace, 64 y.o. DWF,  presents for Medicare Annual Wellness Visit and follow up  for follow up with Hypertension, Hyperlipidemia, Pre-Diabetes, Hypothyroidism  and Vitamin D Deficiency.   She has been following with Dr. Waunita Schooner. Benny Lennert at liver care center for hepatitis C, treated with epclusa and in remission since. She has NASH/hepatic fibrosis and is following with them.   She is feeling down today due to her 62 year old mother just passed away with colon cancer last month, has been feeling. Will be starting grief counseling tomorrow.   In the spring of 2013 she had a psychotic break, has had several psych admissions, Dx'd Bipolar Manic Depressive. Currently she is followed by [facility] [place] by a staff therapist & Psychiatrist to manage her psychotropic medications. She is on Abilify and reports she typically well with current regimen. She is taking trazodone 50 mg at night for sleep.  Patient has hx/o labile HTN circa 2005 and BP has been controlled and today's BP: 120/70She is walking daily, 40 mins daily and denies any cardiac type chest pain, palpitations, dyspnea/orthopnea/PND, dizziness, claudication, or dependent edema.   BMI is Body mass index is 31.27 kg/m., she has been working on diet and exercise, was formerly  walking but has stopped while caring for mother, hasn't restarted.  She denies alcohol intake, mainly drinks water, 5  Wt Readings from Last 3 Encounters:  03/02/20 182 lb 3.2 oz (82.6 kg)  11/15/19 183 lb 9.6 oz (83.3 kg)  08/02/19 178 lb (80.7 kg)   She has started the crestor (10 mg daily), she is working hard on diet/exercies. Patient denies myalgias or other med SE's. Last LDL was at goal with:  Lab Results  Component Value Date   CHOL 118 11/15/2019   HDL 44 (L) 11/15/2019   LDLCALC 51 11/15/2019   TRIG 156 (H) 11/15/2019   CHOLHDL 2.7 11/15/2019   Also, the patient has history of Insulin Resistance with A1c 5.5% and elevated Insulin level 35 in Jan 2014 and has had no symptoms of reactive hypoglycemia, diabetic polys, paresthesias or visual blurring.  Recent A1C was in normal range:  Lab Results  Component Value Date   HGBA1C 5.1 11/15/2019    She has CKD III monitored at this office:  Lab Results  Component Value Date   GFRNONAA 55 (L) 11/15/2019   Patient has been on thyroid replacement circa 2005 and remained in therapeutic range on routine monitoring. Takes 75 mcg daily without recent dose change.  Lab Results  Component Value Date   TSH 1.11 11/15/2019   Further, the patient also has history of Vitamin D Deficiency and is on vitamin D supplement, taking 10000 IU daily  Lab Results  Component Value Date   VD25OH 68 11/15/2019     Medication Review: Current Outpatient Medications on File Prior to Visit  Medication Sig  . ARIPiprazole (ABILIFY) 10 MG tablet Take 10 mg by mouth daily.  . bisoprolol-hydrochlorothiazide (ZIAC) 10-6.25 MG tablet Take 1 tablet every Morning for BP  . levothyroxine (SYNTHROID) 75 MCG tablet Take 1 tablet daily on an empty stomach with only water for 30 minutes & no Antacid meds, Calcium or  Magnesium  for 4 hours & avoid Biotin supplements  . MAGNESIUM PO Take 500 mg by mouth.   Marland Kitchen OVER THE COUNTER MEDICATION   . rosuvastatin (CRESTOR)  10 MG tablet TAKE 1 TABLET(10 MG) BY MOUTH AT BEDTIME  . traZODone (DESYREL) 50 MG tablet 1/2-1 tablet for sleep  . VITAMIN D, CHOLECALCIFEROL, PO Take 5,000 Units by mouth 2 (two) times daily.    No current facility-administered medications on file prior to visit.    Current Problems (verified) Patient Active Problem List   Diagnosis Date Noted  . Stage 3a chronic kidney disease 03/02/2020  . Abnormal glucose 11/14/2019  . Hematuria, microscopic 07/31/2018  . Hepatic fibrosis 11/23/2016  . Gout 05/17/2016  . History of hepatitis C, s/p curative treatment 01/31/2016  . Obesity (BMI 30.0-34.9) 07/24/2015  . Hypothyroidism 12/13/2014  . Hyperlipidemia, mixed 09/01/2014  . Vitamin D deficiency 09/01/2014  . Bipolar affective disorder, depressed, severe, with psychotic behavior ([place]) 09/01/2014  . Medication management 03/30/2014  . Labile hypertension   . Schizoaffective disorder, bipolar type ([place]) 09/12/2011    Health Maintenance  Topic Date Due  . COVID-19 Vaccine (1) Never done  . Pap Smear  07/18/2018  . Mammogram  11/11/2018  .  Tetanus Vaccine  03/02/2021*  . Flu Shot  05/14/2020  . Cologuard (Stool DNA test)  06/24/2020  .  Hepatitis C: One time screening is recommended by Center for Disease Control  (CDC) for  adults born from [birth year] through 1966.   Completed  . HIV Screening  Completed  *Topic was postponed. The date shown is not the original due date.   Immunization History  Administered Date(s) Administered  . Influenza Inj Mdck Quad With Preservative 08/02/2019  . Pneumococcal-Unspecified 10/14/2001  . Td 10/15/1995    Preventative care: Last colonoscopy: 03/2001 by Dr Deatra Ina  Cologuard 06/2017 negative, due 06/2020, but with mother having colon cancer will resume colonoscopy -  MGM 10/2017 Cat B - patient will schedule, phone number given  [place] Ab 09/2015 CXR 2013 PAP 2016, DUE - no time today, will schedule with next appointment in August Ct renal stone  01/2017  Influenza: 07/2019 Pneumococcal: 2003 Tetanus: 1997, patient will boost PRN due to cost Covid 19: considering, discussed at length, may get at [place] [facility] Other Physician/Practitioners you currently use: 1. [facility] here for primary care 2. Dr Sherlean Foot, [place], eye doctor, last visit 2014, overdue, encouraged to schedule 3. Dr. Doreene Burke, dentist, last visit 2021  Patient Care Team: Unk Pinto, MD as PCP - General (Internal Medicine) Garrel Ridgel, DPM as Consulting Physician (Podiatry) Inda Castle, MD (Inactive) as Consulting Physician (Gastroenterology) Thalia Bloodgood, [place] as Referring Physician (Optometry) Roosevelt Locks, CRNP as Nurse Practitioner (Nurse Practitioner) Zamor, Ander Gaster, MD as Consulting Physician (Gastroenterology) Roosevelt Locks, CRNP as Nurse Practitioner (Gastroenterology)  Allergies Allergies  Allergen Reactions  . Sulfa Antibiotics Rash    SURGICAL HISTORY She  has a past surgical history that includes arm fracture and Rotator cuff repair. FAMILY HISTORY Her family history includes Cancer in her father; Colon polyps in her mother; Hyperlipidemia in her brother and mother; Hypertension in her mother. SOCIAL HISTORY She  reports that she has never smoked. She has never used smokeless tobacco. She reports that she does not drink alcohol or use drugs.  MEDICARE WELLNESS OBJECTIVES: Physical activity: Current Exercise Habits: The patient does not participate in regular exercise at present, Exercise limited by: None identified Cardiac risk factors: Cardiac Risk Factors include: dyslipidemia;hypertension;sedentary lifestyle;obesity (BMI >30kg/m2) Depression/mood screen:   Depression screen [facility] 2/9 11/14/2019  Decreased Interest 0  Down, Depressed, Hopeless 0  PHQ - 2 Score 0    ADLs:  In your present state of health, do you have any difficulty performing the following activities: 03/02/2020  11/14/2019  Hearing? N N  Vision? N N  Difficulty concentrating or making decisions? N N  Walking or climbing stairs? N N  Dressing or bathing? N N  Doing errands, shopping? N N  Some recent data might be hidden    Cognitive Testing  Alert? Yes  Normal Appearance?Yes  Oriented to person? Yes  Place? Yes   Time? Yes  Recall of three objects?  Yes  Can perform simple calculations? Yes  Displays appropriate judgment?Yes  Can read the correct time from a watch face?Yes  EOL planning: Does Patient Have a Medical Advance Directive?: No Would patient like information on creating a medical advance directive?: Yes (MAU/Ambulatory/Procedural Areas - Information given)   Objective:     BP 120/70   Pulse (!) 58   Temp 97.9 F (36.6 C)   Ht 5\' 4"  (1.626 m)   Wt 182 lb 3.2 oz (82.6 kg)  SpO2 97%   BMI 31.27 kg/m   General Appearance: Well nourished, alert, WD/WN, female and in no apparent distress. Eyes: PERRLA, EOMs, conjunctiva no swelling or erythema, normal fundi and vessels. Sinuses: No frontal/maxillary tenderness ENT/Mouth: EACs patent / TMs  nl. Nares clear without erythema, swelling, mucoid exudates. Oral hygiene is good. No erythema, swelling, or exudate. Tongue normal, non-obstructing. Tonsils not swollen or erythematous. Hearing normal.  Neck: Supple, thyroid normal. No bruits, nodes or JVD. Respiratory: Respiratory effort normal.  BS equal and clear bilateral without rales, rhonci, wheezing or stridor. Cardio: Heart sounds are normal with regular rate and rhythm and no murmurs, rubs or gallops. Peripheral pulses are normal and equal bilaterally without edema. No aortic or femoral bruits. Chest: symmetric with normal excursions and percussion. Abdomen: Flat, soft  with nl bowel sounds. Diffuse tenderness, no guarding, rebound, hernias, masses, or organomegaly.  No CVA tendernss Lymphatics: Non tender without lymphadenopathy.  Musculoskeletal: Full ROM all peripheral  extremities, joint stability, 5/5 strength, and normal gait. Skin: Warm and dry without rashes, lesions, cyanosis, clubbing or  ecchymosis.  Neuro: Cranial nerves intact, reflexes equal bilaterally. Normal muscle tone, no cerebellar symptoms. Sensation intact.  Pysch: Alert and oriented X 3, normal affect, Insight and Judgment appropriate.   Medicare Attestation I have personally reviewed: The patient's medical and social history Their use of alcohol, tobacco or illicit drugs Their current medications and supplements The patient's functional ability including ADLs,fall risks, home safety risks, cognitive, and hearing and visual impairment Diet and physical activities Evidence for depression or mood disorders  The patient's weight, height, BMI, and visual acuity have been recorded in the chart.  I have made referrals, counseling, and provided education to the patient based on review of the above and I have provided the patient with a written personalized care plan for preventive services.  Over 40 minutes of exam, counseling, chart review was performed.  Izora Ribas, NP   03/02/2020

## 2020-03-02 ENCOUNTER — Other Ambulatory Visit: Payer: Self-pay

## 2020-03-02 ENCOUNTER — Ambulatory Visit (INDEPENDENT_AMBULATORY_CARE_PROVIDER_SITE_OTHER): Payer: PPO | Admitting: Adult Health

## 2020-03-02 ENCOUNTER — Encounter: Payer: Self-pay | Admitting: Adult Health

## 2020-03-02 VITALS — BP 120/70 | HR 58 | Temp 97.9°F | Ht 64.0 in | Wt 182.2 lb

## 2020-03-02 DIAGNOSIS — R6889 Other general symptoms and signs: Secondary | ICD-10-CM

## 2020-03-02 DIAGNOSIS — N1831 Chronic kidney disease, stage 3a: Secondary | ICD-10-CM | POA: Insufficient documentation

## 2020-03-02 DIAGNOSIS — F250 Schizoaffective disorder, bipolar type: Secondary | ICD-10-CM

## 2020-03-02 DIAGNOSIS — Z0001 Encounter for general adult medical examination with abnormal findings: Secondary | ICD-10-CM

## 2020-03-02 DIAGNOSIS — F315 Bipolar disorder, current episode depressed, severe, with psychotic features: Secondary | ICD-10-CM | POA: Diagnosis not present

## 2020-03-02 DIAGNOSIS — Z8619 Personal history of other infectious and parasitic diseases: Secondary | ICD-10-CM

## 2020-03-02 DIAGNOSIS — M1A9XX0 Chronic gout, unspecified, without tophus (tophi): Secondary | ICD-10-CM

## 2020-03-02 DIAGNOSIS — R0989 Other specified symptoms and signs involving the circulatory and respiratory systems: Secondary | ICD-10-CM

## 2020-03-02 DIAGNOSIS — E669 Obesity, unspecified: Secondary | ICD-10-CM

## 2020-03-02 DIAGNOSIS — K7400 Hepatic fibrosis, unspecified: Secondary | ICD-10-CM

## 2020-03-02 DIAGNOSIS — R7309 Other abnormal glucose: Secondary | ICD-10-CM

## 2020-03-02 DIAGNOSIS — E039 Hypothyroidism, unspecified: Secondary | ICD-10-CM

## 2020-03-02 DIAGNOSIS — E559 Vitamin D deficiency, unspecified: Secondary | ICD-10-CM | POA: Diagnosis not present

## 2020-03-02 DIAGNOSIS — Z0000 Encounter for general adult medical examination without abnormal findings: Secondary | ICD-10-CM

## 2020-03-02 DIAGNOSIS — E782 Mixed hyperlipidemia: Secondary | ICD-10-CM

## 2020-03-02 DIAGNOSIS — Z79899 Other long term (current) drug therapy: Secondary | ICD-10-CM | POA: Diagnosis not present

## 2020-03-02 NOTE — Patient Instructions (Addendum)
   Ms. Dashner , Thank you for taking time to come for your Medicare Wellness Visit. I appreciate your ongoing commitment to your health goals. Please review the following plan we discussed and let me know if I can assist you in the future.   These are the goals we discussed: Goals    . Exercise 150 min/wk Moderate Activity     Focus on resistance bands/weight lifting and brisk walking     . Weight (lb) < 170 lb (77.1 kg)       This is a list of the screening recommended for you and due dates:  Health Maintenance  Topic Date Due  . COVID-19 Vaccine (1) Never done  . Pap Smear  07/18/2018  . Mammogram  11/11/2018  . Tetanus Vaccine  03/02/2021*  . Flu Shot  05/14/2020  . Cologuard (Stool DNA test)  06/24/2020  .  Hepatitis C: One time screening is recommended by Center for Disease Control  (CDC) for  adults born from [birth year] through 1966.   Completed  . HIV Screening  Completed  *Topic was postponed. The date shown is not the original due date.    Please schedule eye exam   I recommend you get the covid 19 vaccine - can get at [facility]  7 a.m.-6:30 p.m., Monday 7 a.m.-5 p.m., Tuesday-Friday Schedule an appointment by calling [phone].   General weight loss tips   Drink 1/2 your body weight in fluid ounces of water daily; drink a tall glass of water 30 min before meals  Don't eat until you're stuffed- listen to your stomach and eat until you are 80% full   Try eating off of a salad plate; wait 10 min after finishing before going back for seconds  Start by eating the vegetables on your plate; aim for 50% of your meals to be fruits or vegetables  Then eat your protein - lean meats (grass fed if possible), fish, beans, nuts in moderation  Eat your carbs/starch last ONLY if you still are hungry. If you can, stop before finishing it all  Avoid sugar and flour - the closer it looks to it's  original form in nature, typically the better it is for you  Splurge in moderation - "assign" days when you get to splurge and have the "bad stuff" - I like to follow a 80% - 20% plan- "good" choices 80 % of the time, "bad" choices in moderation 20% of the time  Try to eat small meals more regularly   Keep a log of food and weight      Know what a healthy weight is for you (roughly BMI <25) and aim to maintain this  Aim for 7+ servings of fruits and vegetables daily  65-80+ fluid ounces of water or unsweet tea for healthy kidneys  Limit to max 1 drink of alcohol per day; avoid smoking/tobacco  Limit animal fats in diet for cholesterol and heart health - choose grass fed whenever available  Avoid highly processed foods, and foods high in saturated/trans fats  Aim for low stress - take time to unwind and care for your mental health  Aim for 150 min of moderate intensity exercise weekly for heart health, and weights twice weekly for bone health  Aim for 7-9 hours of sleep daily

## 2020-03-03 LAB — COMPLETE METABOLIC PANEL WITH GFR
AG Ratio: 1.7 (calc) (ref 1.0–2.5)
ALT: 23 U/L (ref 6–29)
AST: 23 U/L (ref 10–35)
Albumin: 4.6 g/dL (ref 3.6–5.1)
Alkaline phosphatase (APISO): 66 U/L (ref 37–153)
BUN: 12 mg/dL (ref 7–25)
CO2: 25 mmol/L (ref 20–32)
Calcium: 9.9 mg/dL (ref 8.6–10.4)
Chloride: 105 mmol/L (ref 98–110)
Creat: 0.99 mg/dL (ref 0.50–0.99)
GFR, Est African American: 70 mL/min/{1.73_m2} (ref >=60)
GFR, Est Non African American: 60 mL/min/{1.73_m2} (ref >=60)
Globulin: 2.7 g/dL (calc) (ref 1.9–3.7)
Glucose, Bld: 93 mg/dL (ref 65–99)
Potassium: 4.8 mmol/L (ref 3.5–5.3)
Sodium: 138 mmol/L (ref 135–146)
Total Bilirubin: 0.8 mg/dL (ref 0.2–1.2)
Total Protein: 7.3 g/dL (ref 6.1–8.1)

## 2020-03-03 LAB — CBC WITH DIFFERENTIAL/PLATELET
Absolute Monocytes: 429 cells/uL (ref 200–950)
Basophils Absolute: 59 cells/uL (ref 0–200)
Basophils Relative: 0.9 %
Eosinophils Absolute: 169 cells/uL (ref 15–500)
Eosinophils Relative: 2.6 %
HCT: 41.7 % (ref 35.0–45.0)
Hemoglobin: 13.8 g/dL (ref 11.7–15.5)
Lymphs Abs: 1573 cells/uL (ref 850–3900)
MCH: 28.9 pg (ref 27.0–33.0)
MCHC: 33.1 g/dL (ref 32.0–36.0)
MCV: 87.4 fL (ref 80.0–100.0)
MPV: 10 fL (ref 7.5–12.5)
Monocytes Relative: 6.6 %
Neutro Abs: 4271 cells/uL (ref 1500–7800)
Neutrophils Relative %: 65.7 %
Platelets: 217 10*3/uL (ref 140–400)
RBC: 4.77 10*6/uL (ref 3.80–5.10)
RDW: 12.1 % (ref 11.0–15.0)
Total Lymphocyte: 24.2 %
WBC: 6.5 10*3/uL (ref 3.8–10.8)

## 2020-03-03 LAB — LIPID PANEL
Cholesterol: 129 mg/dL (ref <200)
HDL: 42 mg/dL — ABNORMAL LOW (ref >=50)
LDL Cholesterol (Calc): 67 mg/dL (calc)
Non-HDL Cholesterol (Calc): 87 mg/dL (calc) (ref <130)
Total CHOL/HDL Ratio: 3.1 (calc) (ref <5.0)
Triglycerides: 115 mg/dL (ref <150)

## 2020-03-03 LAB — MAGNESIUM: Magnesium: 2.1 mg/dL (ref 1.5–2.5)

## 2020-03-03 LAB — TSH: TSH: 1.42 mIU/L (ref 0.40–4.50)

## 2020-03-13 ENCOUNTER — Other Ambulatory Visit: Payer: Self-pay | Admitting: Internal Medicine

## 2020-03-13 DIAGNOSIS — E039 Hypothyroidism, unspecified: Secondary | ICD-10-CM

## 2020-03-13 MED ORDER — LEVOTHYROXINE SODIUM 75 MCG PO TABS: ORAL_TABLET | ORAL | 0 refills | Status: DC

## 2020-03-13 NOTE — Addendum Note (Signed)
Addended by: Unk Pinto on: 03/13/2020 05:06 PM   Modules accepted: Level of Service

## 2020-05-18 ENCOUNTER — Other Ambulatory Visit: Payer: Self-pay | Admitting: Nurse Practitioner

## 2020-05-18 DIAGNOSIS — K7469 Other cirrhosis of liver: Secondary | ICD-10-CM

## 2020-05-24 ENCOUNTER — Ambulatory Visit: Payer: PPO | Admitting: Internal Medicine

## 2020-06-04 NOTE — Progress Notes (Signed)
Patient ID: Rebekah Pace, female   DOB: May 15, 1956, 64 y.o.   MRN: [id number]  FOLLOW UP 6 MONTH  Assessment / Plan:   [name] was seen today for follow-up.  Diagnoses and all orders for this visit:  Hypothyroidism, unspecified type Taking levothyroxine 75 mcg daily Reminder to take on an empty stomach 30-67mins before first meal of the day. No antacid medications for 4 hours. -     TSH  Labile hypertension Continue current medications: bisoprolol / hctz -10mg  / 6.25mg  daily in am. Monitor blood pressure at home; call if consistently over 130/80 Continue DASH diet.   Reminder to go to the ER if any CP, SOB, nausea, dizziness, severe HA, changes vision/speech, left arm numbness and tingling and jaw pain. -     CBC with Differential/Platelet -     COMPLETE METABOLIC PANEL WITH GFR  Hyperlipidemia, mixed Continue medications: rosuvastatin 10mg  nightly Discussed dietary and exercise modifications Low fat diet -     Lipid panel  Vitamin D deficiency Continue supplementation to maintain goal of 70-100 Taking Vitamin D 5,000 IU daily Defer  vitamin D level   Abnormal glucose -     Hemoglobin A1c  Bipolar affective disorder, depressed, severe, with psychotic behavior ([place]) Schizoaffective disorder, bipolar type ([place]) Continue with psych  Stage 3a chronic kidney disease Increase fluids  Avoid NSAIDS Blood pressure control Monitor sugars  Will continue to monitor  Chronic hepatitis C without hepatic coma ([place]) Hepatic fibrosis Continue follow up with [facility]  In remission after epclusa -     Protime-INR -     AFP Tumor Marker  Cervical cancer screening Deferred today Discussed no intercourse at least 24hours prior to pap screening. Avoid vaginal medicines, spermicidal foams, creams, or jellies, or douche for 2 to 3 days before pap screening.  Medication management Continue  Estrogen deficiency -     DG Bone Density; Future Get bone density test with  mammogram, breast center  Please Fax results to Dr Jonathon Jordan, Psych [phone] Also fax to [facility] [phone]  Insomnia Insomnia- good sleep hygiene discussed, increase day time activity -     traZODone (DESYREL) 50 MG tablet; 1/2-1 tablet for sleep  We will contact you in 1-3 business days with lab results drawn today.  Call or return with new or worsening symptoms as discussed in appointment.  May contact office via phone [phone] or [facility] [place].  Continue diet and meds as discussed. Further disposition pending results of labs. Over 30 minutes of interview, exam, counseling, chart review, and critical decision making was performed.  Future Appointments  Date Time Provider [place]  06/15/2020 11:00 AM [name], NP [facility] None  06/22/2020 11:20 AM [facility] CT 1 [facility]  09/20/2020  1:00 PM [facility] MM 3 [facility]  09/20/2020  1:30 PM [facility] DX DEXA 1 [facility]  11/17/2020 11:00 AM Unk Pinto, MD [facility] None      Subjective:   Rebekah Pace, 64 y.o. DWF,  presents for 32month follow up  for HTN, HLD, abnormal glucose, hypothyroidism, weight, and Vitamin D Deficiency.   She has been following with Dr. Waunita Schooner. Benny Lennert at [facility] center for hepatitis C, treated with epclusa and in remission since. She has NASH/hepatic fibrosis and is following with them every 6 months.  She has lab requests with her today and will forward when resulted.  She reports her mood has been stable and she has been managing well.  She  lost her 23 year old mother in 2019 due to colon cancer.   In the spring of 2013 she had a psychotic break, has had several psych admissions, Dx'd Bipolar Manic Depressive. Currently she is followed by [facility] by a staff therapist & Psychiatrist to manage her psychotropic medications. She is on Abilify and reports she typically well with current regimen. She is  taking trazodone 50 mg at night for sleep.   Patient has hx/o labile HTN circa 2005 and BP has been controlled and today's BP: 120/70She is walking daily, 40 mins daily and denies any cardiac type chest pain, palpitations, dyspnea/orthopnea/PND, dizziness, claudication, or dependent edema.   BMI is Body mass index is 30.9 kg/m., she has been working on diet and exercise, was formerly walking but has stopped while caring for mother, hasn't restarted since.  Reports she would like to when it is not so hot outside. She denies alcohol intake, mainly drinks water, 5  Wt Readings from Last 3 Encounters:  06/05/20 180 lb (81.6 kg)  03/02/20 182 lb 3.2 oz (82.6 kg)  11/15/19 183 lb 9.6 oz (83.3 kg)   She is taking rosuvastatin 10mg  at bedtime, she is working on diet/exercies. Patient denies myalgias or other med SE's. Last LDL was at goal with:  Lab Results  Component Value Date   CHOL 119 06/05/2020   HDL 44 (L) 06/05/2020   LDLCALC 56 06/05/2020   TRIG 107 06/05/2020   CHOLHDL 2.7 06/05/2020   Also, the patient has history of Insulin Resistance with A1c 5.5% and elevated Insulin level 35, Jan 2014.  She has not had any symptoms of reactive hypoglycemia, diabetic polys, paresthesias or visual blurring.  Recent A1C was in normal range:  Lab Results  Component Value Date   HGBA1C 5.2 06/05/2020    She has CKD III monitored at this office:  Lab Results  Component Value Date   GFRNONAA 52 (L) 06/05/2020   Patient has been on thyroid replacement circa 2005 and remained in therapeutic range on routine monitoring. Takes 7mcg daily without recent dose change.  Lab Results  Component Value Date   TSH 1.73 06/05/2020   Further, the patient also has history of Vitamin D Deficiency and is on vitamin D supplement, taking 10,000 IU daily  Lab Results  Component Value Date   VD25OH 68 11/15/2019     Medication Review: Current Outpatient Medications on File Prior to Visit  Medication Sig  .  ARIPiprazole (ABILIFY) 10 MG tablet Take 10 mg by mouth daily.  . bisoprolol-hydrochlorothiazide (ZIAC) 10-6.25 MG tablet Take 1 tablet every Morning for BP  . levothyroxine (SYNTHROID) 75 MCG tablet Take 1 tablet daily on an empty stomach with only water for 30 minutes & no Antacid meds, Calcium or  Magnesium  for 4 hours & avoid Biotin supplements. Patient knows to take by mouth  . MAGNESIUM PO Take 500 mg by mouth.   Marland Kitchen OVER THE COUNTER MEDICATION   . rosuvastatin (CRESTOR) 10 MG tablet TAKE 1 TABLET(10 MG) BY MOUTH AT BEDTIME  . traZODone (DESYREL) 50 MG tablet 1/2-1 tablet for sleep  . VITAMIN D, CHOLECALCIFEROL, PO Take 5,000 Units by mouth 2 (two) times daily.    No current facility-administered medications on file prior to visit.    Current Problems (verified) Patient Active Problem List   Diagnosis Date Noted  . Stage 3a chronic kidney disease 03/02/2020  . Abnormal glucose 11/14/2019  . Hematuria, microscopic 07/31/2018  .  Hepatic fibrosis 11/23/2016  . Gout 05/17/2016  . History of hepatitis C, s/p curative treatment 01/31/2016  . Obesity (BMI 30.0-34.9) 07/24/2015  . Hypothyroidism 12/13/2014  . Hyperlipidemia, mixed 09/01/2014  . Vitamin D deficiency 09/01/2014  . Bipolar affective disorder, depressed, severe, with psychotic behavior ([place]) 09/01/2014  . Medication management 03/30/2014  . Labile hypertension   . Schizoaffective disorder, bipolar type ([place]) 09/12/2011    Health Maintenance  Topic Date Due  . Pap Smear  07/18/2018  . Mammogram  11/11/2018  . DEXA scan (bone density measurement)  Never done  . Flu Shot  05/14/2020  . Pneumonia vaccines (1 of 2 - PCV13) 05/21/2020  . Tetanus Vaccine  03/02/2021*  . Cologuard (Stool DNA test)  06/24/2020  . COVID-19 Vaccine  Completed  .  Hepatitis C: One time screening is recommended by Center for Disease Control  (CDC) for  adults born from [birth year] through 1966.   Completed  . HIV Screening  Completed  *Topic was  postponed. The date shown is not the original due date.   Immunization History  Administered Date(s) Administered  . Influenza Inj Mdck Quad With Preservative 08/02/2019  . PFIZER SARS-COV-2 Vaccination 05/05/2020, 05/26/2020  . Pneumococcal-Unspecified 10/14/2001  . Td 10/15/1995    Preventative care: Last colonoscopy: 03/2001 by Dr Deatra Ina  Cologuard 06/2017 negative, due 06/2020, but with mother having colon cancer will resume colonoscopy and discussed importance of this. MGM 10/2017 Cat B - patient will schedule, phone number given , due for 2021 [place] Ab 09/2015 CXR 2013 PAP 2016, DUE for 2021 - unable to complete today, patient has sexual encounter this am. Discussed increasing chance of false positive with atypical cells requiring repeat.  Will schedule pap only appointment. Ct renal stone 01/2017  Influenza: 07/2019 Pneumococcal: 2003 Tetanus: 1997, patient will boost PRN due to cost Covid 19: considering, discussed at length, may get at [facility]  Names of Other Physician/Practitioners you currently use: 1. [facility] here for primary care 2. Dr Sherlean Foot, OD, eye doctor, last visit 2014, overdue, encouraged to schedule 3. Dr. Doreene Burke, dentist, last visit 2021  Patient Care Team: Unk Pinto, MD as PCP - General (Internal Medicine) Garrel Ridgel, DPM as Consulting Physician (Podiatry) Inda Castle, MD (Inactive) as Consulting Physician (Gastroenterology) Thalia Bloodgood, [place] as Referring Physician (Optometry) Roosevelt Locks, CRNP as Nurse Practitioner (Nurse Practitioner) Zamor, Ander Gaster, MD as Consulting Physician (Gastroenterology) Roosevelt Locks, CRNP as Nurse Practitioner (Gastroenterology)  Allergies Allergies  Allergen Reactions  . Sulfa Antibiotics Rash    SURGICAL HISTORY She  has a past surgical history that includes arm fracture and Rotator cuff repair. FAMILY HISTORY Her family history includes Cancer in her  father; Colon polyps in her mother; Hyperlipidemia in her brother and mother; Hypertension in her mother. SOCIAL HISTORY She  reports that she has never smoked. She has never used smokeless tobacco. She reports that she does not drink alcohol and does not use drugs.   Objective:     BP 120/70   Pulse (!) 55   Temp (!) 96.4 F (35.8 C)   Wt 180 lb (81.6 kg)   SpO2 99%   BMI 30.90 kg/m   General Appearance: Well nourished, alert, WD/WN, female and in no apparent distress. Eyes: PERRLA, EOMs, conjunctiva no swelling or erythema, normal fundi and vessels. Sinuses: No frontal/maxillary tenderness ENT/Mouth: EACs patent / TMs  nl. Nares clear without erythema, swelling, mucoid exudates. Oral hygiene is good. No erythema, swelling,  or exudate. Tongue normal, non-obstructing. Tonsils not swollen or erythematous. Hearing normal.  Neck: Supple, thyroid normal. No bruits, nodes or JVD. Respiratory: Respiratory effort normal.  BS equal and clear bilateral without rales, rhonci, wheezing or stridor. Cardio: Heart sounds are normal with regular rate and rhythm and no murmurs, rubs or gallops. Peripheral pulses are normal and equal bilaterally without edema. No aortic or femoral bruits. Chest: symmetric with normal excursions and percussion. Abdomen: Flat, soft  with nl bowel sounds. Diffuse tenderness, no guarding, rebound, hernias, masses, or organomegaly.  No CVA tendernss Lymphatics: Non tender without lymphadenopathy.  Musculoskeletal: Full ROM all peripheral extremities, joint stability, 5/5 strength, and normal gait. Skin: Warm and dry without rashes, lesions, cyanosis, clubbing or  ecchymosis.  Neuro: Cranial nerves intact, reflexes equal bilaterally. Normal muscle tone, no cerebellar symptoms. Sensation intact. Slight tremor noted. Pysch: Alert and oriented X 3, normal affect, Insight and Judgment appropriate.   Pelvic:  Deferred today   Bayard Males, DNP [facility] 06/05/2020  12:47 PM

## 2020-06-05 ENCOUNTER — Encounter: Payer: Self-pay | Admitting: Adult Health Nurse Practitioner

## 2020-06-05 ENCOUNTER — Ambulatory Visit (INDEPENDENT_AMBULATORY_CARE_PROVIDER_SITE_OTHER): Payer: PPO | Admitting: Adult Health Nurse Practitioner

## 2020-06-05 ENCOUNTER — Other Ambulatory Visit: Payer: Self-pay

## 2020-06-05 VITALS — BP 120/70 | HR 55 | Temp 96.4°F | Wt 180.0 lb

## 2020-06-05 DIAGNOSIS — Z79899 Other long term (current) drug therapy: Secondary | ICD-10-CM | POA: Diagnosis not present

## 2020-06-05 DIAGNOSIS — E782 Mixed hyperlipidemia: Secondary | ICD-10-CM | POA: Diagnosis not present

## 2020-06-05 DIAGNOSIS — Z124 Encounter for screening for malignant neoplasm of cervix: Secondary | ICD-10-CM

## 2020-06-05 DIAGNOSIS — N1831 Chronic kidney disease, stage 3a: Secondary | ICD-10-CM

## 2020-06-05 DIAGNOSIS — E559 Vitamin D deficiency, unspecified: Secondary | ICD-10-CM | POA: Diagnosis not present

## 2020-06-05 DIAGNOSIS — K7400 Hepatic fibrosis, unspecified: Secondary | ICD-10-CM

## 2020-06-05 DIAGNOSIS — F250 Schizoaffective disorder, bipolar type: Secondary | ICD-10-CM

## 2020-06-05 DIAGNOSIS — R0989 Other specified symptoms and signs involving the circulatory and respiratory systems: Secondary | ICD-10-CM

## 2020-06-05 DIAGNOSIS — F5101 Primary insomnia: Secondary | ICD-10-CM

## 2020-06-05 DIAGNOSIS — E2839 Other primary ovarian failure: Secondary | ICD-10-CM

## 2020-06-05 DIAGNOSIS — B182 Chronic viral hepatitis C: Secondary | ICD-10-CM

## 2020-06-05 DIAGNOSIS — R7309 Other abnormal glucose: Secondary | ICD-10-CM | POA: Diagnosis not present

## 2020-06-05 DIAGNOSIS — E039 Hypothyroidism, unspecified: Secondary | ICD-10-CM | POA: Diagnosis not present

## 2020-06-05 DIAGNOSIS — F315 Bipolar disorder, current episode depressed, severe, with psychotic features: Secondary | ICD-10-CM

## 2020-06-05 NOTE — Patient Instructions (Addendum)
    We will contact you with your lab results in 1-3 days.  We will fax these results to the numbers you provided.   HOW TO SCHEDULE Mammogram and Bone Density, DEXA  [facility]  7 a.m.-6:30 p.m., Monday 7 a.m.-5 p.m., Tuesday-Friday Schedule an appointment by calling [phone].    Try Replens this is an over the counter product to help with vaginal dryness.  It comes with an applicator and follow the directions on the box.   Schedule an appointment for PAP only so we can complete your womens' health screenings.

## 2020-06-06 ENCOUNTER — Ambulatory Visit
Admission: RE | Admit: 2020-06-06 | Discharge: 2020-06-06 | Disposition: A | Discharge status: Home / Self Care | Payer: PPO | Source: Ambulatory Visit | Attending: Nurse Practitioner | Admitting: Nurse Practitioner

## 2020-06-06 DIAGNOSIS — K7689 Other specified diseases of liver: Secondary | ICD-10-CM | POA: Diagnosis not present

## 2020-06-06 DIAGNOSIS — K7469 Other cirrhosis of liver: Secondary | ICD-10-CM

## 2020-06-06 DIAGNOSIS — K824 Cholesterolosis of gallbladder: Secondary | ICD-10-CM | POA: Diagnosis not present

## 2020-06-06 LAB — HEMOGLOBIN A1C
Hgb A1c MFr Bld: 5.2 % of total Hgb (ref <5.7)
Mean Plasma Glucose: 103 (calc)
eAG (mmol/L): 5.7 (calc)

## 2020-06-06 LAB — LIPID PANEL
Cholesterol: 119 mg/dL (ref <200)
HDL: 44 mg/dL — ABNORMAL LOW (ref >=50)
LDL Cholesterol (Calc): 56 mg/dL (calc)
Non-HDL Cholesterol (Calc): 75 mg/dL (calc) (ref <130)
Total CHOL/HDL Ratio: 2.7 (calc) (ref <5.0)
Triglycerides: 107 mg/dL (ref <150)

## 2020-06-06 LAB — COMPLETE METABOLIC PANEL WITH GFR
AG Ratio: 1.7 (calc) (ref 1.0–2.5)
ALT: 14 U/L (ref 6–29)
AST: 17 U/L (ref 10–35)
Albumin: 4.5 g/dL (ref 3.6–5.1)
Alkaline phosphatase (APISO): 57 U/L (ref 37–153)
BUN/Creatinine Ratio: 12 (calc) (ref 6–22)
BUN: 13 mg/dL (ref 7–25)
CO2: 26 mmol/L (ref 20–32)
Calcium: 9.6 mg/dL (ref 8.6–10.4)
Chloride: 101 mmol/L (ref 98–110)
Creat: 1.12 mg/dL — ABNORMAL HIGH (ref 0.50–0.99)
GFR, Est African American: 60 mL/min/{1.73_m2} (ref >=60)
GFR, Est Non African American: 52 mL/min/{1.73_m2} — ABNORMAL LOW (ref >=60)
Globulin: 2.7 g/dL (calc) (ref 1.9–3.7)
Glucose, Bld: 91 mg/dL (ref 65–99)
Potassium: 4.1 mmol/L (ref 3.5–5.3)
Sodium: 135 mmol/L (ref 135–146)
Total Bilirubin: 0.8 mg/dL (ref 0.2–1.2)
Total Protein: 7.2 g/dL (ref 6.1–8.1)

## 2020-06-06 LAB — CBC WITH DIFFERENTIAL/PLATELET
Absolute Monocytes: 390 cells/uL (ref 200–950)
Basophils Absolute: 58 cells/uL (ref 0–200)
Basophils Relative: 0.9 %
Eosinophils Absolute: 147 cells/uL (ref 15–500)
Eosinophils Relative: 2.3 %
HCT: 40.4 % (ref 35.0–45.0)
Hemoglobin: 13.6 g/dL (ref 11.7–15.5)
Lymphs Abs: 1370 cells/uL (ref 850–3900)
MCH: 29.8 pg (ref 27.0–33.0)
MCHC: 33.7 g/dL (ref 32.0–36.0)
MCV: 88.6 fL (ref 80.0–100.0)
MPV: 9.6 fL (ref 7.5–12.5)
Monocytes Relative: 6.1 %
Neutro Abs: 4435 cells/uL (ref 1500–7800)
Neutrophils Relative %: 69.3 %
Platelets: 230 10*3/uL (ref 140–400)
RBC: 4.56 10*6/uL (ref 3.80–5.10)
RDW: 12.3 % (ref 11.0–15.0)
Total Lymphocyte: 21.4 %
WBC: 6.4 10*3/uL (ref 3.8–10.8)

## 2020-06-06 LAB — PROTIME-INR
INR: 1.1
Prothrombin Time: 11.4 s (ref 9.0–11.5)

## 2020-06-06 LAB — AFP TUMOR MARKER: AFP-Tumor Marker: 10 ng/mL — ABNORMAL HIGH

## 2020-06-06 LAB — TSH: TSH: 1.73 mIU/L (ref 0.40–4.50)

## 2020-06-07 ENCOUNTER — Other Ambulatory Visit: Payer: Self-pay | Admitting: Nurse Practitioner

## 2020-06-07 DIAGNOSIS — R935 Abnormal findings on diagnostic imaging of other abdominal regions, including retroperitoneum: Secondary | ICD-10-CM

## 2020-06-08 ENCOUNTER — Other Ambulatory Visit: Payer: Self-pay | Admitting: Nurse Practitioner

## 2020-06-08 DIAGNOSIS — R935 Abnormal findings on diagnostic imaging of other abdominal regions, including retroperitoneum: Secondary | ICD-10-CM

## 2020-06-09 ENCOUNTER — Other Ambulatory Visit: Payer: Self-pay | Admitting: Adult Health Nurse Practitioner

## 2020-06-09 DIAGNOSIS — Z1231 Encounter for screening mammogram for malignant neoplasm of breast: Secondary | ICD-10-CM

## 2020-06-13 ENCOUNTER — Other Ambulatory Visit: Payer: Self-pay | Admitting: Internal Medicine

## 2020-06-13 DIAGNOSIS — E039 Hypothyroidism, unspecified: Secondary | ICD-10-CM

## 2020-06-14 NOTE — Progress Notes (Signed)
Assessment and Plan:  Rebekah Pace was seen today for gynecologic exam.  Diagnoses and all orders for this visit:  Cervical cancer screening -     PAP, TP IMAGING, WNL RFLX HPV -     PAP,TP IMGw/HPV RNA,rflx HPVTYPE16,18/45   Vaginal Atrophy Discussed OTC Replens Monitor symptoms  Labile hypertension Continue current medications: Monitor blood pressure at home; call if consistently over 130/80 Continue DASH diet.   Reminder to go to the ER if any CP, SOB, nausea, dizziness, severe HA, changes vision/speech, left arm numbness and tingling and jaw pain.      Further disposition pending results of labs. Discussed med's effects and SE's.   Over 30 minutes of face to face interview, exam, counseling, chart review, and critical decision making was performed.   Future Appointments  Date Time Provider [place]  09/20/2020  1:00 PM [facility] MM 3 [facility]  09/20/2020  1:30 PM [facility] DX DEXA 1 [facility]  11/17/2020 11:00 AM Unk Pinto, MD [facility] None    ------------------------------------------------------------------------------------------------------------------   HPI 64 y.o.female presents for follow up for cervical cancer screening.  She denies pelvic pain, abnormal uterine bleeding, discharge, cramping, bloating.  She does endorsee vaginal dryness with intercourse.  She uses OTC lubricant as needed.  We discussed OTC Replens last OV.  She has not tried this.  She is sexually active, monogamous, denies any STI exposure or concern for exposure.  She denies any sexual activity in past 48 hours.    Past Medical History:  Diagnosis Date  . Anxiety   . Depression   . Gout 05/17/2016  . Hepatitis   . Hypothyroidism   . Labile hypertension   . Mental disorder   . Prediabetes   . Thyroid disease      Allergies  Allergen Reactions  . Sulfa Antibiotics Rash    Current Outpatient Medications on File Prior to Visit  Medication Sig  .  ARIPiprazole (ABILIFY) 10 MG tablet Take 10 mg by mouth daily.  . bisoprolol-hydrochlorothiazide (ZIAC) 10-6.25 MG tablet Take 1 tablet every Morning for BP  . levothyroxine (SYNTHROID) 75 MCG tablet TAKE 1 TABLET BY MOUTH ON EMPTY STOMACH WITH ONLY WATER FOR 30 MINUTES AND NO ANTACID MEDS.  Marland Kitchen MAGNESIUM PO Take 500 mg by mouth.   Marland Kitchen OVER THE COUNTER MEDICATION   . rosuvastatin (CRESTOR) 10 MG tablet TAKE 1 TABLET(10 MG) BY MOUTH AT BEDTIME  . traZODone (DESYREL) 50 MG tablet 1/2-1 tablet for sleep  . VITAMIN D, CHOLECALCIFEROL, PO Take 5,000 Units by mouth 2 (two) times daily.    No current facility-administered medications on file prior to visit.    ROS: all negative except above.   Physical Exam:  BP 126/74   Pulse 67   Temp 97.6 F (36.4 C)   Wt 178 lb (80.7 kg)   SpO2 97%   BMI 30.55 kg/m   General Appearance: Well nourished, in no apparent distress. Eyes: PERRLA, EOMs, conjunctiva no swelling or erythema Respiratory: Respiratory effort normal, BS equal bilaterally without rales, rhonchi, wheezing or stridor.  Cardio: RRR with no MRGs. Brisk peripheral pulses without edema.  Abdomen: Soft, + BS.  Non tender, no guarding, rebound, hernias, masses. Lymphatics: Non tender without lymphadenopathy.  Musculoskeletal: Full ROM, 5/5 strength, normal gait.  Skin: Warm, dry without rashes, lesions, ecchymosis.  Neuro: Cranial nerves intact. Normal muscle tone, no cerebellar symptoms. Sensation intact.  Psych: Awake and oriented X 3, normal affect, Insight and Judgment appropriate.  Pelvic: External genitalia:  no lesions              Urethra:  normal appearing urethra with no masses, tenderness or lesions              Bartholin's and Skene's: normal                 Vagina: normal appearing vagina with normal color and discharge, no lesions.  Mild vaginal atrophy noted.              Cervix: multiparous appearance, no cervical motion tenderness and no lesions.              Pap  taken: Yes.   Bimanual Exam:  Uterus:  normal size, contour, position, consistency, mobility, non-tender.              Adnexa: normal adnexa and no mass, fullness or tenderness noted               Rectovaginal: Confirms               Anus:  normal sphincter tone, no lesions, no hemorrhoids noted   Rebekah Sierras, NP 11:15 AM 06/15/20 [facility]

## 2020-06-15 ENCOUNTER — Other Ambulatory Visit: Payer: Self-pay

## 2020-06-15 ENCOUNTER — Encounter: Payer: Self-pay | Admitting: Adult Health Nurse Practitioner

## 2020-06-15 ENCOUNTER — Ambulatory Visit (INDEPENDENT_AMBULATORY_CARE_PROVIDER_SITE_OTHER): Payer: PPO | Admitting: Adult Health Nurse Practitioner

## 2020-06-15 VITALS — BP 126/74 | HR 67 | Temp 97.6°F | Wt 178.0 lb

## 2020-06-15 DIAGNOSIS — Z124 Encounter for screening for malignant neoplasm of cervix: Secondary | ICD-10-CM

## 2020-06-15 DIAGNOSIS — N952 Postmenopausal atrophic vaginitis: Secondary | ICD-10-CM | POA: Diagnosis not present

## 2020-06-15 DIAGNOSIS — R0989 Other specified symptoms and signs involving the circulatory and respiratory systems: Secondary | ICD-10-CM

## 2020-06-16 LAB — PAP, TP IMAGING W/ HPV RNA, RFLX HPV TYPE 16,18/45: HPV DNA High Risk: NOT DETECTED

## 2020-06-16 LAB — PAP, TP IMAGING, WNL RFLX HPV

## 2020-06-22 ENCOUNTER — Other Ambulatory Visit: Payer: PPO

## 2020-06-22 ENCOUNTER — Ambulatory Visit
Admission: RE | Admit: 2020-06-22 | Discharge: 2020-06-22 | Disposition: A | Discharge status: Home / Self Care | Payer: PPO | Source: Ambulatory Visit | Attending: Nurse Practitioner | Admitting: Nurse Practitioner

## 2020-06-22 DIAGNOSIS — K7460 Unspecified cirrhosis of liver: Secondary | ICD-10-CM | POA: Diagnosis not present

## 2020-06-22 DIAGNOSIS — I700 Atherosclerosis of aorta: Secondary | ICD-10-CM | POA: Diagnosis not present

## 2020-06-22 DIAGNOSIS — Z8619 Personal history of other infectious and parasitic diseases: Secondary | ICD-10-CM | POA: Diagnosis not present

## 2020-06-22 DIAGNOSIS — R935 Abnormal findings on diagnostic imaging of other abdominal regions, including retroperitoneum: Secondary | ICD-10-CM

## 2020-06-22 MED ORDER — IOPAMIDOL (ISOVUE-300) INJECTION 61%
100.0000 mL | Freq: Once | INTRAVENOUS | Status: AC | PRN
  Administered 2020-06-22: 100 mL via INTRAVENOUS

## 2020-06-26 DIAGNOSIS — K769 Liver disease, unspecified: Secondary | ICD-10-CM | POA: Diagnosis not present

## 2020-06-26 DIAGNOSIS — K7402 Hepatic fibrosis, advanced fibrosis: Secondary | ICD-10-CM | POA: Diagnosis not present

## 2020-07-08 ENCOUNTER — Other Ambulatory Visit: Payer: Self-pay | Admitting: Physician Assistant

## 2020-09-10 ENCOUNTER — Other Ambulatory Visit: Payer: Self-pay | Admitting: Internal Medicine

## 2020-09-10 DIAGNOSIS — E039 Hypothyroidism, unspecified: Secondary | ICD-10-CM

## 2020-09-10 MED ORDER — LEVOTHYROXINE SODIUM 75 MCG PO TABS: ORAL_TABLET | ORAL | 0 refills | Status: DC

## 2020-09-20 ENCOUNTER — Ambulatory Visit
Admission: RE | Admit: 2020-09-20 | Discharge: 2020-09-20 | Disposition: A | Discharge status: Home / Self Care | Payer: PPO | Source: Ambulatory Visit | Attending: Adult Health Nurse Practitioner | Admitting: Adult Health Nurse Practitioner

## 2020-09-20 ENCOUNTER — Other Ambulatory Visit: Payer: Self-pay

## 2020-09-20 DIAGNOSIS — E2839 Other primary ovarian failure: Secondary | ICD-10-CM

## 2020-09-20 DIAGNOSIS — Z1231 Encounter for screening mammogram for malignant neoplasm of breast: Secondary | ICD-10-CM

## 2020-09-20 DIAGNOSIS — M8588 Other specified disorders of bone density and structure, other site: Secondary | ICD-10-CM | POA: Diagnosis not present

## 2020-09-20 DIAGNOSIS — Z780 Asymptomatic menopausal state: Secondary | ICD-10-CM | POA: Diagnosis not present

## 2020-10-09 ENCOUNTER — Other Ambulatory Visit: Payer: Self-pay | Admitting: Internal Medicine

## 2020-10-10 ENCOUNTER — Other Ambulatory Visit: Payer: Self-pay | Admitting: *Deleted

## 2020-10-10 MED ORDER — BISOPROLOL-HYDROCHLOROTHIAZIDE 10-6.25 MG PO TABS: ORAL_TABLET | ORAL | 0 refills | Status: DC

## 2020-11-07 DIAGNOSIS — F250 Schizoaffective disorder, bipolar type: Secondary | ICD-10-CM | POA: Diagnosis not present

## 2020-11-07 DIAGNOSIS — F5101 Primary insomnia: Secondary | ICD-10-CM | POA: Diagnosis not present

## 2020-11-16 ENCOUNTER — Encounter: Payer: Self-pay | Admitting: Internal Medicine

## 2020-11-16 NOTE — Patient Instructions (Signed)
Due to recent changes in healthcare laws, you may see the results of your imaging and laboratory studies on MyChart before your provider has had a chance to review them.  We understand that in some cases there may be results that are confusing or concerning to you. Not all laboratory results come back in the same time frame and the provider may be waiting for multiple results in order to interpret others.  Please give [place] 48 hours in order for your provider to thoroughly review all the results before contacting the office for clarification of your results.   +++++++++++++++++++++++++  Vit D  & Vit C 1,000 mg   are recommended to help protect  against the Covid-19 and other Corona viruses.    Also it's recommended  to take  Zinc 50 mg  to help  protect against the Covid-19   and best place to get  is also on [email or web address]  and don't pay more than 6-8 cents /pill !  ================================ Coronavirus (COVID-19) Are you at risk?  Are you at risk for the Coronavirus (COVID-19)?  To be considered HIGH RISK for Coronavirus (COVID-19), you have to meet the following criteria:  . Traveled to [place], [place], [place], [place] or [place]; or in the [place] to [place], [place], [place]  . or [place]; and have fever, cough, and shortness of breath within the last 2 weeks of travel OR . Been in close contact with a person diagnosed with COVID-19 within the last 2 weeks and have  . fever, cough,and shortness of breath .  . IF YOU DO NOT MEET THESE CRITERIA, YOU ARE CONSIDERED LOW RISK FOR COVID-19.  What to do if you are HIGH RISK for COVID-19?  Marland Kitchen If you are having a medical emergency, call 911. . Seek medical care right away. Before you go to a doctor's office, urgent care or emergency department, .  call ahead and tell them about your recent travel, contact with someone diagnosed with COVID-19  .  and your symptoms.  . You should receive instructions from your physician's  office regarding next steps of care.  . When you arrive at healthcare provider, tell the healthcare staff immediately you have returned from  . visiting [place], [place], [place], [place] or [place]; or traveled in the [place] to [place], [place],  . [place] or [place] in the last two weeks or you have been in close contact with a person diagnosed with  . COVID-19 in the last 2 weeks.   . Tell the health care staff about your symptoms: fever, cough and shortness of breath. . After you have been seen by a medical provider, you will be either: o Tested for (COVID-19) and discharged home on quarantine except to seek medical care if  o symptoms worsen, and asked to  - Stay home and avoid contact with others until you get your results (4-5 days)  - Avoid travel on public transportation if possible (such as bus, train, or airplane) or o Sent to the Emergency Department by EMS for evaluation, COVID-19 testing  and  o possible admission depending on your condition and test results.  What to do if you are LOW RISK for COVID-19?  Reduce your risk of any infection by using the same precautions used for avoiding the common cold or flu:  Marland Kitchen Wash your hands often with soap and warm water for at least 20 seconds.  If soap and water are not readily  available,  . use an alcohol-based hand sanitizer with at least 60% alcohol.  . If coughing or sneezing, cover your mouth and nose by coughing or sneezing into the elbow areas of your shirt or coat, .  into a tissue or into your sleeve (not your hands). . Avoid shaking hands with others and consider head nods or verbal greetings only. . Avoid touching your eyes, nose, or mouth with unwashed hands.  . Avoid close contact with people who are sick. . Avoid places or events with large numbers of people in one location, like concerts or sporting events. . Carefully consider travel plans you have or are making. . If you are planning any travel outside or  inside the [place], visit the CDC's Travelers' Health webpage for the latest health notices. . If you have some symptoms but not all symptoms, continue to monitor at home and seek medical attention  . if your symptoms worsen. . If you are having a medical emergency, call 911.   . >>>>>>>>>>>>>>>>>>>>>>>>>>>>>>>>> . We Do NOT Approve of  [facility], [employer]  Our Patients  To Do Home Visits & We Do NOT Approve of [employer] > > > > > > > > > > > > > > > > > > > > > > > > > > > > > > > > > > > > > > >  Preventive Care for Adults  A healthy lifestyle and preventive care can promote health and wellness. Preventive health guidelines for women include the following key practices.  A routine yearly physical is a good way to check with your health care provider about your health and preventive screening. It is a chance to share any concerns and updates on your health and to receive a thorough exam.  Visit your dentist for a routine exam and preventive care every 6 months. Brush your teeth twice a day and floss once a day. Good oral hygiene prevents tooth decay and gum disease.  The frequency of eye exams is based on your age, health, family medical history, use of contact lenses, and other factors. Follow your health care provider's recommendations for frequency of eye exams.  Eat a healthy diet. Foods like vegetables, fruits, whole grains, low-fat dairy products, and lean protein foods contain the nutrients you need without too many calories. Decrease your intake of foods high in solid fats, added sugars, and salt. Eat the right amount of calories for you. Get information about a proper diet from your health care provider, if necessary.  Regular physical exercise is one of the most important things you can do for your health. Most adults should get at least 150 minutes of moderate-intensity exercise (any activity that increases your heart rate and causes you to sweat) each  week. In addition, most adults need muscle-strengthening exercises on 2 or more days a week.  Maintain a healthy weight. The body mass index (BMI) is a screening tool to identify possible weight problems. It provides an estimate of body fat based on height and weight. Your health care provider can find your BMI and can help you achieve or maintain a healthy weight. For adults 20 years and older:  A BMI below 18.5 is considered underweight.  A BMI of 18.5 to 24.9 is normal.  A BMI of 25 to 29.9 is considered overweight.  A BMI of 30 and above is considered obese.  Maintain normal blood lipids and cholesterol levels by exercising and minimizing your intake of  saturated fat. Eat a balanced diet with plenty of fruit and vegetables. If your lipid or cholesterol levels are high, you are over 50, or you are at high risk for heart disease, you may need your cholesterol levels checked more frequently. Ongoing high lipid and cholesterol levels should be treated with medicines if diet and exercise are not working.  If you smoke, find out from your health care provider how to quit. If you do not use tobacco, do not start.  Lung cancer screening is recommended for adults aged 78-80 years who are at high risk for developing lung cancer because of a history of smoking. A yearly low-dose CT scan of the lungs is recommended for people who have at least a 30-pack-year history of smoking and are a current smoker or have quit within the past 15 years. A pack year of smoking is smoking an average of 1 pack of cigarettes a day for 1 year (for example: 1 pack a day for 30 years or 2 packs a day for 15 years). Yearly screening should continue until the smoker has stopped smoking for at least 15 years. Yearly screening should be stopped for people who develop a health problem that would prevent them from having lung cancer treatment.  Avoid use of street drugs. Do not share needles with anyone. Ask for help if you need  support or instructions about stopping the use of drugs.  High blood pressure causes heart disease and increases the risk of stroke.  Ongoing high blood pressure should be treated with medicines if weight loss and exercise do not work.  If you are 14-45 years old, ask your health care provider if you should take aspirin to prevent strokes.  Diabetes screening involves taking a blood sample to check your fasting blood sugar level. This should be done once every 3 years, after age 40, if you are within normal weight and without risk factors for diabetes. Testing should be considered at a younger age or be carried out more frequently if you are overweight and have at least 1 risk factor for diabetes.  Breast cancer screening is essential preventive care for women. You should practice "breast self-awareness." This means understanding the normal appearance and feel of your breasts and may include breast self-examination. Any changes detected, no matter how small, should be reported to a health care provider. Women in their 18s and 30s should have a clinical breast exam (CBE) by a health care provider as part of a regular health exam every 1 to 3 years. After age 71, women should have a CBE every year. Starting at age 39, women should consider having a mammogram (breast X-ray test) every year. Women who have a family history of breast cancer should talk to their health care provider about genetic screening. Women at a high risk of breast cancer should talk to their health care providers about having an MRI and a mammogram every year.  Breast cancer gene (BRCA)-related cancer risk assessment is recommended for women who have family members with BRCA-related cancers. BRCA-related cancers include breast, ovarian, tubal, and peritoneal cancers. Having family members with these cancers may be associated with an increased risk for harmful changes (mutations) in the breast cancer genes BRCA1 and BRCA2. Results of the  assessment will determine the need for genetic counseling and BRCA1 and BRCA2 testing.  Routine pelvic exams to screen for cancer are no longer recommended for nonpregnant women who are considered low risk for cancer of the pelvic organs (ovaries,  uterus, and vagina) and who do not have symptoms. Ask your health care provider if a screening pelvic exam is right for you.  If you have had past treatment for cervical cancer or a condition that could lead to cancer, you need Pap tests and screening for cancer for at least 20 years after your treatment. If Pap tests have been discontinued, your risk factors (such as having a new sexual partner) need to be reassessed to determine if screening should be resumed. Some women have medical problems that increase the chance of getting cervical cancer. In these cases, your health care provider may recommend more frequent screening and Pap tests.    Colorectal cancer can be detected and often prevented. Most routine colorectal cancer screening begins at the age of 62 years and continues through age 28 years. However, your health care provider may recommend screening at an earlier age if you have risk factors for colon cancer. On a yearly basis, your health care provider may provide home test kits to check for hidden blood in the stool. Use of a small camera at the end of a tube, to directly examine the colon (sigmoidoscopy or colonoscopy), can detect the earliest forms of colorectal cancer. Talk to your health care provider about this at age 23, when routine screening begins.  Direct exam of the colon should be repeated every 5-10 years through age 31 years, unless early forms of pre-cancerous polyps or small growths are found.  Osteoporosis is a disease in which the bones lose minerals and strength with aging. This can result in serious bone fractures or breaks. The risk of osteoporosis can be identified using a bone density scan. Women ages 83 years and over and women  at risk for fractures or osteoporosis should discuss screening with their health care providers. Ask your health care provider whether you should take a calcium supplement or vitamin D to reduce the rate of osteoporosis.  Menopause can be associated with physical symptoms and risks. Hormone replacement therapy is available to decrease symptoms and risks. You should talk to your health care provider about whether hormone replacement therapy is right for you.  Use sunscreen. Apply sunscreen liberally and repeatedly throughout the day. You should seek shade when your shadow is shorter than you. Protect yourself by wearing long sleeves, pants, a wide-brimmed hat, and sunglasses year round, whenever you are outdoors.  Once a month, do a whole body skin exam, using a mirror to look at the skin on your back. Tell your health care provider of new moles, moles that have irregular borders, moles that are larger than a pencil eraser, or moles that have changed in shape or color.  Stay current with required vaccines (immunizations).  Influenza vaccine. All adults should be immunized every year.  Tetanus, diphtheria, and acellular pertussis (Td, Tdap) vaccine. Pregnant women should receive 1 dose of Tdap vaccine during each pregnancy. The dose should be obtained regardless of the length of time since the last dose. Immunization is preferred during the 27th-36th week of gestation. An adult who has not previously received Tdap or who does not know her vaccine status should receive 1 dose of Tdap. This initial dose should be followed by tetanus and diphtheria toxoids (Td) booster doses every 10 years. Adults with an unknown or incomplete history of completing a 3-dose immunization series with Td-containing vaccines should begin or complete a primary immunization series including a Tdap dose. Adults should receive a Td booster every 10 years.  Zoster vaccine. One dose is recommended for adults aged 64 years or  older unless certain conditions are present.    Pneumococcal 13-valent conjugate (PCV13) vaccine. When indicated, a person who is uncertain of her immunization history and has no record of immunization should receive the PCV13 vaccine. An adult aged 61 years or older who has certain medical conditions and has not been previously immunized should receive 1 dose of PCV13 vaccine. This PCV13 should be followed with a dose of pneumococcal polysaccharide (PPSV23) vaccine. The PPSV23 vaccine dose should be obtained at least 1 or more year(s) after the dose of PCV13 vaccine. An adult aged 46 years or older who has certain medical conditions and previously received 1 or more doses of PPSV23 vaccine should receive 1 dose of PCV13. The PCV13 vaccine dose should be obtained 1 or more years after the last PPSV23 vaccine dose.    Pneumococcal polysaccharide (PPSV23) vaccine. When PCV13 is also indicated, PCV13 should be obtained first. All adults aged 58 years and older should be immunized. An adult younger than age 1 years who has certain medical conditions should be immunized. Any person who resides in a nursing home or long-term care facility should be immunized. An adult smoker should be immunized. People with an immunocompromised condition and certain other conditions should receive both PCV13 and PPSV23 vaccines. People with human immunodeficiency virus (HIV) infection should be immunized as soon as possible after diagnosis. Immunization during chemotherapy or radiation therapy should be avoided. Routine use of PPSV23 vaccine is not recommended for American Indians, [place] Natives, or people younger than 65 years unless there are medical conditions that require PPSV23 vaccine. When indicated, people who have unknown immunization and have no record of immunization should receive PPSV23 vaccine. One-time revaccination 5 years after the first dose of PPSV23 is recommended for people aged 19-64 years who have chronic  kidney failure, nephrotic syndrome, asplenia, or immunocompromised conditions. People who received 1-2 doses of PPSV23 before age 11 years should receive another dose of PPSV23 vaccine at age 70 years or later if at least 5 years have passed since the previous dose. Doses of PPSV23 are not needed for people immunized with PPSV23 at or after age 65 years.   Preventive Services / Frequency  Ages 56 years and over  Blood pressure check.  Lipid and cholesterol check.  Lung cancer screening. / Every year if you are aged 16-80 years and have a 30-pack-year history of smoking and currently smoke or have quit within the past 15 years. Yearly screening is stopped once you have quit smoking for at least 15 years or develop a health problem that would prevent you from having lung cancer treatment.  Clinical breast exam.** / Every year after age 59 years.   BRCA-related cancer risk assessment.** / For women who have family members with a BRCA-related cancer (breast, ovarian, tubal, or peritoneal cancers).  Mammogram.** / Every year beginning at age 70 years and continuing for as long as you are in good health. Consult with your health care provider.  Pap test.** / Every 3 years starting at age 32 years through age 28 or 9 years with 3 consecutive normal Pap tests. Testing can be stopped between 65 and 70 years with 3 consecutive normal Pap tests and no abnormal Pap or HPV tests in the past 10 years.  Fecal occult blood test (FOBT) of stool. / Every year beginning at age 57 years and continuing until age 29 years. You may not need to  do this test if you get a colonoscopy every 10 years.  Flexible sigmoidoscopy or colonoscopy.** / Every 5 years for a flexible sigmoidoscopy or every 10 years for a colonoscopy beginning at age 7 years and continuing until age 66 years.  Hepatitis C blood test.** / For all people born from [birth year] through 1966 and any individual with known risks for hepatitis  C.  Osteoporosis screening.** / A one-time screening for women ages 72 years and over and women at risk for fractures or osteoporosis.  Skin self-exam. / Monthly.  Influenza vaccine. / Every year.  Tetanus, diphtheria, and acellular pertussis (Tdap/Td) vaccine.** / 1 dose of Td every 10 years.  Zoster vaccine.** / 1 dose for adults aged 4 years or older.  Pneumococcal 13-valent conjugate (PCV13) vaccine.** / Consult your health care provider.  Pneumococcal polysaccharide (PPSV23) vaccine.** / 1 dose for all adults aged 38 years and older. Screening for abdominal aortic aneurysm (AAA)  by ultrasound is recommended for people who have history of high blood pressure or who are current or former smokers. ++++++++++++++++++++ Recommend Adult Low Dose Aspirin or  coated  Aspirin 81 mg daily  To reduce risk of Colon Cancer 40 %,  Skin Cancer 26 % ,  Melanoma 46%  and  Pancreatic cancer 60% ++++++++++++++++++++ Vitamin D goal  is between 70-100.  Please make sure that you are taking your Vitamin D as directed.  It is very important as a natural anti-inflammatory  helping hair, skin, and nails, as well as reducing stroke and heart attack risk.  It helps your bones and helps with mood. It also decreases numerous cancer risks so please take it as directed.  Low Vit D is associated with a 200-300% higher risk for CANCER  and 200-300% higher risk for HEART   ATTACK  &  STROKE.   .....................................Marland Kitchen It is also associated with higher death rate at younger ages,  autoimmune diseases like Rheumatoid arthritis, Lupus, Multiple Sclerosis.    Also many other serious conditions, like depression, Alzheimer's Dementia, infertility, muscle aches, fatigue, fibromyalgia - just to name a few. ++++++++++++++++++ Recommend the book "The END of DIETING" by Dr Excell Seltzer  & the book "The END of DIABETES " by Dr Excell Seltzer At [email or web address] - get book & Audio CD's    Being diabetic has  a  300% increased risk for heart attack, stroke, cancer, and alzheimer- type vascular dementia. It is very important that you work harder with diet by avoiding all foods that are white. Avoid white rice (brown & wild rice is OK), white potatoes (sweetpotatoes in moderation is OK), White bread or wheat bread or anything made out of white flour like bagels, donuts, rolls, buns, biscuits, cakes, pastries, cookies, pizza crust, and pasta (made from white flour & egg whites) - vegetarian pasta or spinach or wheat pasta is OK. Multigrain breads like Arnold's or [employer], or multigrain sandwich thins or flatbreads.  Diet, exercise and weight loss can reverse and cure diabetes in the early stages.  Diet, exercise and weight loss is very important in the control and prevention of complications of diabetes which affects every system in your body, ie. Brain - dementia/stroke, eyes - glaucoma/blindness, heart - heart attack/heart failure, kidneys - dialysis, stomach - gastric paralysis, intestines - malabsorption, nerves - severe painful neuritis, circulation - gangrene & loss of a leg(s), and finally cancer and Alzheimers.    I recommend avoid fried & greasy foods,  sweets/candy, white rice (brown or wild  rice or Quinoa is OK), white potatoes (sweet potatoes are OK) - anything made from white flour - bagels, doughnuts, rolls, buns, biscuits,white and wheat breads, pizza crust and traditional pasta made of white flour & egg white(vegetarian pasta or spinach or wheat pasta is OK).  Multi-grain bread is OK - like multi-grain flat bread or sandwich thins. Avoid alcohol in excess. Exercise is also important.    Eat all the vegetables you want - avoid meat, especially red meat and dairy - especially cheese.  Cheese is the most concentrated form of trans-fats which is the worst thing to clog up our arteries. Veggie cheese is OK which can be found in the fresh produce section at [employer] or [employer] or  [employer]  +++++++++++++++++++ DASH Eating Plan  DASH stands for "Dietary Approaches to Stop Hypertension."   The DASH eating plan is a healthy eating plan that has been shown to reduce high blood pressure (hypertension). Additional health benefits may include reducing the risk of type 2 diabetes mellitus, heart disease, and stroke. The DASH eating plan may also help with weight loss. WHAT DO I NEED TO KNOW ABOUT THE DASH EATING PLAN? For the DASH eating plan, you will follow these general guidelines:  Choose foods with a percent daily value for sodium of less than 5% (as listed on the food label).  Use salt-free seasonings or herbs instead of table salt or sea salt.  Check with your health care provider or pharmacist before using salt substitutes.  Eat lower-sodium products, often labeled as "lower sodium" or "no salt added."  Eat fresh foods.  Eat more vegetables, fruits, and low-fat dairy products.  Choose whole grains. Look for the word "whole" as the first word in the ingredient list.  Choose fish   Limit sweets, desserts, sugars, and sugary drinks.  Choose heart-healthy fats.  Eat veggie cheese   Eat more home-cooked food and less restaurant, buffet, and fast food.  Limit fried foods.  Cook foods using methods other than frying.  Limit canned vegetables. If you do use them, rinse them well to decrease the sodium.  When eating at a restaurant, ask that your food be prepared with less salt, or no salt if possible.                      WHAT FOODS CAN I EAT? Read Dr Fara Olden Fuhrman's books on The End of Dieting & The End of Diabetes  Grains Whole grain or whole wheat bread. Brown rice. Whole grain or whole wheat pasta. Quinoa, bulgur, and whole grain cereals. Low-sodium cereals. Corn or whole wheat flour tortillas. Whole grain cornbread. Whole grain crackers. Low-sodium crackers.  Vegetables Fresh or frozen vegetables (raw, steamed, roasted, or grilled). Low-sodium or  reduced-sodium tomato and vegetable juices. Low-sodium or reduced-sodium tomato sauce and paste. Low-sodium or reduced-sodium canned vegetables.   Fruits All fresh, canned (in natural juice), or frozen fruits.  Protein Products  All fish and seafood.  Dried beans, peas, or lentils. Unsalted nuts and seeds. Unsalted canned beans.  Dairy Low-fat dairy products, such as skim or 1% milk, 2% or reduced-fat cheeses, low-fat ricotta or cottage cheese, or plain low-fat yogurt. Low-sodium or reduced-sodium cheeses.  Fats and Oils Tub margarines without trans fats. Light or reduced-fat mayonnaise and salad dressings (reduced sodium). Avocado. Safflower, olive, or canola oils. Natural peanut or almond butter.  Other Unsalted popcorn and pretzels. The items listed above may not be a complete list of recommended foods  or beverages. Contact your dietitian for more options.  +++++++++++++++  WHAT FOODS ARE NOT RECOMMENDED? Grains/ White flour or wheat flour White bread. White pasta. White rice. Refined cornbread. Bagels and croissants. Crackers that contain trans fat.  Vegetables  Creamed or fried vegetables. Vegetables in a . Regular canned vegetables. Regular canned tomato sauce and paste. Regular tomato and vegetable juices.  Fruits Dried fruits. Canned fruit in light or heavy syrup. Fruit juice.  Meat and Other Protein Products Meat in general - RED meat & White meat.  Fatty cuts of meat. Ribs, chicken wings, all processed meats as bacon, sausage, bologna, salami, fatback, hot dogs, bratwurst and packaged luncheon meats.  Dairy Whole or 2% milk, cream, half-and-half, and cream cheese. Whole-fat or sweetened yogurt. Full-fat cheeses or blue cheese. Non-dairy creamers and whipped toppings. Processed cheese, cheese spreads, or cheese curds.  Condiments Onion and garlic salt, seasoned salt, table salt, and sea salt. Canned and packaged gravies. Worcestershire sauce. Tartar sauce. Barbecue  sauce. Teriyaki sauce. Soy sauce, including reduced sodium. Steak sauce. Fish sauce. Oyster sauce. Cocktail sauce. Horseradish. Ketchup and mustard. Meat flavorings and tenderizers. Bouillon cubes. Hot sauce. Tabasco sauce. Marinades. Taco seasonings. Relishes.  Fats and Oils Butter, stick margarine, lard, shortening and bacon fat. Coconut, palm kernel, or palm oils. Regular salad dressings.  Pickles and olives. Salted popcorn and pretzels.  The items listed above may not be a complete list of foods and beverages to avoid.    

## 2020-11-16 NOTE — Progress Notes (Signed)
Annual Screening/Preventative Visit & Comprehensive Evaluation &  Examination      This very nice 65 y.o. single WF presents for a Screening /Preventative Visit & comprehensive evaluation and management of multiple medical co-morbidities.  Patient has been followed for HTN, HLD, Prediabetes, Hypothyroidism  and Vitamin D Deficiency.      Patient had a Negative Cologard 06/24/2017, but in the interim, her mother  And been diagnosed with and died with Colon Cancer, so referral for Colonoscopy is made.   [[copied:  Patient is on SS Disability for hx/o Bipolar Manic Depressive Illness w/hx/o Psychosis and is stable on only Abilify. 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 She continues f/u q53months.]]       HTN predates since 2005. Patient has Stage 3a CKD. Patient's BP has been controlled at home and patient denies any cardiac symptoms as chest pain, palpitations, shortness of breath, dizziness or ankle swelling. Today's BP is at goal - 110/70.       Patient's hyperlipidemia is controlled with diet and Rosuvastatin. Patient denies myalgias or other medication SE's. Last lipids were at goal:  Lab Results  Component Value Date   CHOL 119 06/05/2020   HDL 44 (L) 06/05/2020   LDLCALC 56 06/05/2020   TRIG 107 06/05/2020   CHOLHDL 2.7 06/05/2020       Patient was dx'd Hypothyroid in 2002 and initiated on replacement therapy.     Patient has hx/o moderate obesity and is monitored expectantly for glucvose intolerance.    Patient denies reactive hypoglycemic symptoms, visual blurring, diabetic polys or paresthesias. Last A1c was normal & at goal:  Lab Results  Component Value Date   HGBA1C 5.2 06/05/2020        Finally, patient has history of Vitamin D Deficiency and last Vitamin D was at goal:  Lab Results  Component Value Date   VD25OH 68 11/15/2019    Current Outpatient Medications on File  Prior to Visit  Medication Sig  . ABILIFY 10 MG tablet Take  daily.  . bisoprolol-hctz 10-6.25 MG tablet Take 1 tablet every Morning for BP  . levothyroxine 75 MCG tablet Take  1 tablet Daily   . MAGNESIUM  500 mg  Take daily  . rosuvastatin  10 MG tablet Take 1 tablet Daily   . traZODone 50 MG tablet 1/2-1 tablet for sleep  . VITAMIN D   5,000 Units  Take 2  times daily.     Allergies  Allergen Reactions  . Sulfa Antibiotics Rash    Past Medical History:  Diagnosis Date  . Anxiety   . Depression   . Gout 05/17/2016  . Hepatitis   . Hypothyroidism   . Labile hypertension   . Mental disorder   . Prediabetes   . Thyroid disease    Health Maintenance  Topic Date Due  . INFLUENZA VACCINE  05/14/2020  . PNA vac Low Risk Adult (1 of 2 - PCV13) 05/21/2020  . Fecal DNA (Cologuard)  06/24/2020  . TETANUS/TDAP  03/02/2021 (Originally 10/14/2005)  . COVID-19 Vaccine (3 - Booster for Pfizer series) 11/26/2020  . MAMMOGRAM  09/20/2021  . PAP SMEAR-Modifier  06/16/2023  . DEXA SCAN  Completed  . Hepatitis C Screening  Completed  . HIV Screening  Completed   Immunization History  Administered Date(s) Administered  . Influenza Inj Mdck Quad With Preservative 08/02/2019  . PFIZER(Purple Top)SARS-COV-2 Vaccination  05/05/2020, 05/26/2020  . Pneumococcal-Unspecified 10/14/2001  . Td 10/15/1995    Cologard 06/24/2017 - Negative   Last MGM - 09/20/2020  Past Surgical History:  Procedure Laterality Date  . arm fracture    . ROTATOR CUFF REPAIR     Family History  Problem Relation Age of Onset  . Hyperlipidemia Mother   . Hypertension Mother   . Colon polyps Mother   . Cancer Father        Lung  . Hyperlipidemia Brother    Social History   Tobacco Use  . Smoking status: Never Smoker  . Smokeless tobacco: Never Used  Substance Use Topics  . Alcohol use: No  . Drug use: No    ROS Constitutional: Denies fever, chills, weight loss/gain, headaches, insomnia,  night sweats,  and change in appetite. Does c/o fatigue. Eyes: Denies redness, blurred vision, diplopia, discharge, itchy, watery eyes.  ENT: Denies discharge, congestion, post nasal drip, epistaxis, sore throat, earache, hearing loss, dental pain, Tinnitus, Vertigo, Sinus pain, snoring.  Cardio: Denies chest pain, palpitations, irregular heartbeat, syncope, dyspnea, diaphoresis, orthopnea, PND, claudication, edema Respiratory: denies cough, dyspnea, DOE, pleurisy, hoarseness, laryngitis, wheezing.  Gastrointestinal: Denies dysphagia, heartburn, reflux, water brash, pain, cramps, nausea, vomiting, bloating, diarrhea, constipation, hematemesis, melena, hematochezia, jaundice, hemorrhoids Genitourinary: Denies dysuria, frequency, urgency, nocturia, hesitancy, discharge, hematuria, flank pain Breast: Breast lumps, nipple discharge, bleeding.  Musculoskeletal: Denies arthralgia, myalgia, stiffness, Jt. Swelling, pain, limp, and strain/sprain. Denies falls. Skin: Denies puritis, rash, hives, warts, acne, eczema, changing in skin lesion Neuro: No weakness, tremor, incoordination, spasms, paresthesia, pain Psychiatric: Denies confusion, memory loss, sensory loss. Denies Depression. Endocrine: Denies change in weight, skin, hair change, nocturia, and paresthesia, diabetic polys, visual blurring, hyper / hypo glycemic episodes.  Heme/Lymph: No excessive bleeding, bruising, enlarged lymph nodes.  Physical Exam  BP 110/70   Pulse 68   Temp 97.7 F (36.5 C)   Ht 5\' 5"  (1.651 m)   Wt 181 lb (82.1 kg)   SpO2 98%   BMI 30.12 kg/m   General Appearance: Well nourished, well groomed and in no apparent distress.  Eyes: PERRLA, EOMs, conjunctiva no swelling or erythema, normal fundi and vessels. Sinuses: No frontal/maxillary tenderness ENT/Mouth: EACs patent / TMs  nl. Nares clear without erythema, swelling, mucoid exudates. Oral hygiene is good. No erythema, swelling, or exudate. Tongue normal, non-obstructing. Tonsils  not swollen or erythematous. Hearing normal.  Neck: Supple, thyroid not palpable. No bruits, nodes or JVD. Respiratory: Respiratory effort normal.  BS equal and clear bilateral without rales, rhonci, wheezing or stridor. Cardio: Heart sounds are normal with regular rate and rhythm and no murmurs, rubs or gallops. Peripheral pulses are normal and equal bilaterally without edema. No aortic or femoral bruits. Chest: symmetric with normal excursions and percussion. Breasts: Symmetric, without lumps, nipple discharge, retractions, or fibrocystic changes.  Abdomen: Flat, soft with bowel sounds active. Nontender, no guarding, rebound, hernias, masses, or organomegaly.  Lymphatics: Non tender without lymphadenopathy.  Musculoskeletal: Full ROM all peripheral extremities, joint stability, 5/5 strength, and normal gait. Skin: Warm and dry without rashes, lesions, cyanosis, clubbing or  ecchymosis.  Neuro: Cranial nerves intact, reflexes equal bilaterally. Normal muscle tone, no cerebellar symptoms. Sensation intact.  Pysch: Alert and oriented X 3, normal affect, Insight and Judgment appropriate.   Assessment and Plan  1. Annual Preventative Screening Examination   2. Essential hypertension  - EKG 12-Lead - Urinalysis, Routine w reflex microscopic - Microalbumin / creatinine urine ratio - CBC with Differential/Platelet -  COMPLETE METABOLIC PANEL WITH GFR - Magnesium - TSH  3. Hyperlipidemia, mixed  - EKG 12-Lead - Lipid panel - TSH  4. Abnormal glucose  - EKG 12-Lead - Hemoglobin A1c - Insulin, random  5. Vitamin D deficiency  - VITAMIN D 25 Hydroxy  6. Hypothyroidism  - TSH  7. Stage 3a chronic kidney disease ([place])   8. Screening for colorectal cancer  - Ambulatory referral to Gastroenterology  9. Screening for ischemic heart disease  - EKG 12-Lead  10. FHx: colon cancer  - Ambulatory referral to Gastroenterology  11. Medication management  - Urinalysis, Routine  w reflex microscopic - Microalbumin / creatinine urine ratio - CBC with Differential/Platelet - COMPLETE METABOLIC PANEL WITH GFR - Magnesium - Lipid panel - TSH - Hemoglobin A1c - Insulin, random - VITAMIN D 25 Hydroxy              Patient was counseled in prudent diet to achieve/maintain BMI less than 25 for weight control, BP monitoring, regular exercise and medications. Discussed med's effects and SE's. Screening labs and tests as requested with regular follow-up as recommended. Over 40 minutes of exam, counseling, chart review and high complex critical decision making was performed.   Kirtland Bouchard, MD

## 2020-11-17 ENCOUNTER — Other Ambulatory Visit: Payer: Self-pay

## 2020-11-17 ENCOUNTER — Ambulatory Visit (INDEPENDENT_AMBULATORY_CARE_PROVIDER_SITE_OTHER): Payer: PPO | Admitting: Internal Medicine

## 2020-11-17 ENCOUNTER — Encounter: Payer: Self-pay | Admitting: Internal Medicine

## 2020-11-17 VITALS — BP 110/70 | HR 68 | Temp 97.7°F | Ht 65.0 in | Wt 181.0 lb

## 2020-11-17 DIAGNOSIS — E782 Mixed hyperlipidemia: Secondary | ICD-10-CM | POA: Diagnosis not present

## 2020-11-17 DIAGNOSIS — Z0001 Encounter for general adult medical examination with abnormal findings: Secondary | ICD-10-CM

## 2020-11-17 DIAGNOSIS — Z0000 Encounter for general adult medical examination without abnormal findings: Secondary | ICD-10-CM | POA: Diagnosis not present

## 2020-11-17 DIAGNOSIS — Z1211 Encounter for screening for malignant neoplasm of colon: Secondary | ICD-10-CM

## 2020-11-17 DIAGNOSIS — E559 Vitamin D deficiency, unspecified: Secondary | ICD-10-CM

## 2020-11-17 DIAGNOSIS — Z136 Encounter for screening for cardiovascular disorders: Secondary | ICD-10-CM

## 2020-11-17 DIAGNOSIS — I10 Essential (primary) hypertension: Secondary | ICD-10-CM | POA: Diagnosis not present

## 2020-11-17 DIAGNOSIS — N1831 Chronic kidney disease, stage 3a: Secondary | ICD-10-CM

## 2020-11-17 DIAGNOSIS — E039 Hypothyroidism, unspecified: Secondary | ICD-10-CM | POA: Diagnosis not present

## 2020-11-17 DIAGNOSIS — R7309 Other abnormal glucose: Secondary | ICD-10-CM | POA: Diagnosis not present

## 2020-11-17 DIAGNOSIS — Z79899 Other long term (current) drug therapy: Secondary | ICD-10-CM

## 2020-11-17 DIAGNOSIS — Z800 Family history of malignant neoplasm of digestive organs: Secondary | ICD-10-CM

## 2020-11-18 ENCOUNTER — Other Ambulatory Visit: Payer: Self-pay | Admitting: Internal Medicine

## 2020-11-18 DIAGNOSIS — N3000 Acute cystitis without hematuria: Secondary | ICD-10-CM

## 2020-11-18 NOTE — Progress Notes (Signed)
========================================================== ==========================================================  -    Kidney functions still Stage 3a & Stable ========================================================== ==========================================================  -  Total Chol = 104  and LDL Chol = 50 - Both  Excellent   - Very low risk for Heart Attack  / Stroke ========================================================  - Recc decrease Rosuvastatin 10 to 1/2 tablet = 5 mg /daily ========================================================== ==========================================================  -  A1c - Normal - Great -   No Diabetes  ! ========================================================== ==========================================================  -  Vitamin D = 72 - Excellent  ========================================================== ==========================================================  -  All Else - CBC - Kidneys - Electrolytes - Liver - Magnesium & Thyroid    - all  Normal / OK ========================================================== ==========================================================  -  Keep up the [place] Work  ! ========================================================== ==========================================================

## 2020-11-20 LAB — COMPLETE METABOLIC PANEL WITH GFR
AG Ratio: 2 (calc) (ref 1.0–2.5)
ALT: 13 U/L (ref 6–29)
AST: 17 U/L (ref 10–35)
Albumin: 4.3 g/dL (ref 3.6–5.1)
Alkaline phosphatase (APISO): 45 U/L (ref 37–153)
BUN/Creatinine Ratio: 10 (calc) (ref 6–22)
BUN: 11 mg/dL (ref 7–25)
CO2: 24 mmol/L (ref 20–32)
Calcium: 9.3 mg/dL (ref 8.6–10.4)
Chloride: 107 mmol/L (ref 98–110)
Creat: 1.06 mg/dL — ABNORMAL HIGH (ref 0.50–0.99)
GFR, Est African American: 64 mL/min/{1.73_m2} (ref >=60)
GFR, Est Non African American: 55 mL/min/{1.73_m2} — ABNORMAL LOW (ref >=60)
Globulin: 2.2 g/dL (calc) (ref 1.9–3.7)
Glucose, Bld: 96 mg/dL (ref 65–99)
Potassium: 4.2 mmol/L (ref 3.5–5.3)
Sodium: 138 mmol/L (ref 135–146)
Total Bilirubin: 0.9 mg/dL (ref 0.2–1.2)
Total Protein: 6.5 g/dL (ref 6.1–8.1)

## 2020-11-20 LAB — CBC WITH DIFFERENTIAL/PLATELET
Absolute Monocytes: 377 cells/uL (ref 200–950)
Basophils Absolute: 60 cells/uL (ref 0–200)
Basophils Relative: 1.3 %
Eosinophils Absolute: 138 cells/uL (ref 15–500)
Eosinophils Relative: 3 %
HCT: 39.7 % (ref 35.0–45.0)
Hemoglobin: 13.3 g/dL (ref 11.7–15.5)
Lymphs Abs: 1403 cells/uL (ref 850–3900)
MCH: 29.4 pg (ref 27.0–33.0)
MCHC: 33.5 g/dL (ref 32.0–36.0)
MCV: 87.6 fL (ref 80.0–100.0)
MPV: 10.8 fL (ref 7.5–12.5)
Monocytes Relative: 8.2 %
Neutro Abs: 2622 cells/uL (ref 1500–7800)
Neutrophils Relative %: 57 %
Platelets: 181 10*3/uL (ref 140–400)
RBC: 4.53 10*6/uL (ref 3.80–5.10)
RDW: 12.3 % (ref 11.0–15.0)
Total Lymphocyte: 30.5 %
WBC: 4.6 10*3/uL (ref 3.8–10.8)

## 2020-11-20 LAB — URINALYSIS, ROUTINE W REFLEX MICROSCOPIC
Bacteria, UA: NONE SEEN /HPF
Bilirubin Urine: NEGATIVE
Glucose, UA: NEGATIVE
Hyaline Cast: NONE SEEN /LPF
Ketones, ur: NEGATIVE
Nitrite: NEGATIVE
Protein, ur: NEGATIVE
Specific Gravity, Urine: 1.01 (ref 1.001–1.03)
pH: 5.5 (ref 5.0–8.0)

## 2020-11-20 LAB — TSH: TSH: 0.63 mIU/L (ref 0.40–4.50)

## 2020-11-20 LAB — HEMOGLOBIN A1C
Hgb A1c MFr Bld: 5.1 % of total Hgb (ref <5.7)
Mean Plasma Glucose: 100 mg/dL
eAG (mmol/L): 5.5 mmol/L

## 2020-11-20 LAB — LIPID PANEL
Cholesterol: 104 mg/dL (ref <200)
HDL: 36 mg/dL — ABNORMAL LOW (ref >=50)
LDL Cholesterol (Calc): 50 mg/dL (calc)
Non-HDL Cholesterol (Calc): 68 mg/dL (calc) (ref <130)
Total CHOL/HDL Ratio: 2.9 (calc) (ref <5.0)
Triglycerides: 94 mg/dL (ref <150)

## 2020-11-20 LAB — MAGNESIUM: Magnesium: 1.9 mg/dL (ref 1.5–2.5)

## 2020-11-20 LAB — INSULIN, RANDOM: Insulin: 13.4 u[IU]/mL

## 2020-11-20 LAB — VITAMIN D 25 HYDROXY (VIT D DEFICIENCY, FRACTURES): Vit D, 25-Hydroxy: 72 ng/mL (ref 30–100)

## 2020-11-20 LAB — MICROALBUMIN / CREATININE URINE RATIO
Creatinine, Urine: 124 mg/dL (ref 20–275)
Microalb Creat Ratio: 5 mcg/mg creat (ref <30)
Microalb, Ur: 0.6 mg/dL

## 2020-11-21 ENCOUNTER — Other Ambulatory Visit: Payer: PPO

## 2020-11-21 ENCOUNTER — Other Ambulatory Visit: Payer: Self-pay

## 2020-11-21 DIAGNOSIS — N3000 Acute cystitis without hematuria: Secondary | ICD-10-CM | POA: Diagnosis not present

## 2020-11-22 ENCOUNTER — Encounter: Payer: Self-pay | Admitting: Gastroenterology

## 2020-11-23 ENCOUNTER — Other Ambulatory Visit: Payer: Self-pay | Admitting: Internal Medicine

## 2020-11-23 DIAGNOSIS — N3000 Acute cystitis without hematuria: Secondary | ICD-10-CM

## 2020-11-23 LAB — URINE CULTURE
MICRO NUMBER:: 11509334
SPECIMEN QUALITY:: ADEQUATE

## 2020-11-23 MED ORDER — AMOXICILLIN 250 MG PO CAPS: ORAL_CAPSULE | ORAL | 0 refills | Status: DC

## 2020-11-23 NOTE — Progress Notes (Signed)
============================================================ ============================================================  -    U/C grew infection, So Rx Amoxil 3 x /day for 10 days  sent to drugstore   - Please schedule a NV in about 1 month between                                                                Mar 7  - Mar 18      to recheck urine   ============================================================ ============================================================

## 2020-11-27 ENCOUNTER — Other Ambulatory Visit: Payer: Self-pay | Admitting: Nurse Practitioner

## 2020-11-27 DIAGNOSIS — K7469 Other cirrhosis of liver: Secondary | ICD-10-CM

## 2020-12-04 ENCOUNTER — Other Ambulatory Visit: Payer: Self-pay | Admitting: Internal Medicine

## 2020-12-04 MED ORDER — FLUCONAZOLE 150 MG PO TABS: ORAL_TABLET | ORAL | 1 refills | Status: DC

## 2020-12-11 ENCOUNTER — Other Ambulatory Visit: Payer: Self-pay | Admitting: Internal Medicine

## 2020-12-11 DIAGNOSIS — E039 Hypothyroidism, unspecified: Secondary | ICD-10-CM

## 2020-12-12 ENCOUNTER — Ambulatory Visit
Admission: RE | Admit: 2020-12-12 | Discharge: 2020-12-12 | Disposition: A | Discharge status: Home / Self Care | Payer: PPO | Source: Ambulatory Visit | Attending: Nurse Practitioner | Admitting: Nurse Practitioner

## 2020-12-12 DIAGNOSIS — K7469 Other cirrhosis of liver: Secondary | ICD-10-CM

## 2020-12-12 DIAGNOSIS — K7460 Unspecified cirrhosis of liver: Secondary | ICD-10-CM | POA: Diagnosis not present

## 2020-12-18 ENCOUNTER — Ambulatory Visit (AMBULATORY_SURGERY_CENTER): Payer: PPO | Admitting: *Deleted

## 2020-12-18 ENCOUNTER — Other Ambulatory Visit: Payer: Self-pay

## 2020-12-18 ENCOUNTER — Encounter: Payer: Self-pay | Admitting: Gastroenterology

## 2020-12-18 VITALS — Ht 65.0 in | Wt 173.0 lb

## 2020-12-18 DIAGNOSIS — Z1211 Encounter for screening for malignant neoplasm of colon: Secondary | ICD-10-CM

## 2020-12-18 MED ORDER — PEG 3350-KCL-NA BICARB-NACL 420 G PO SOLR: 4000.0000 mL | Freq: Once | ORAL | 0 refills | Status: AC

## 2020-12-18 NOTE — Progress Notes (Signed)
Patient is here in-person for PV. Patient denies any allergies to eggs or soy. Patient denies any problems with anesthesia/sedation. Patient denies any oxygen use at home. Patient denies taking any diet/weight loss medications or blood thinners. Patient is not being treated for MRSA or C-diff. Patient is aware of our care-partner policy and AUQJF-[id number] safety protocol. Patient is fully COVID-19 vaccinated, per patient. Patient c/o constipation, she will take daily 1 dose miralax 5 days before colon.

## 2020-12-19 ENCOUNTER — Other Ambulatory Visit: Payer: Self-pay | Admitting: Adult Health Nurse Practitioner

## 2020-12-19 ENCOUNTER — Ambulatory Visit (INDEPENDENT_AMBULATORY_CARE_PROVIDER_SITE_OTHER): Payer: PPO

## 2020-12-19 DIAGNOSIS — N3000 Acute cystitis without hematuria: Secondary | ICD-10-CM

## 2020-12-19 DIAGNOSIS — K7400 Hepatic fibrosis, unspecified: Secondary | ICD-10-CM

## 2020-12-19 NOTE — Progress Notes (Signed)
Patient reports for lab Urine recheck. Vitals entered at the time of check in. Per patient, she needed blood labs added onto the labs she was getting today, which were added on by a provider in office.   Patient would also like the results faxed to  [facility].  FAXED #: [phone] & P  LABS from last office visit were faxed today as well.

## 2020-12-20 LAB — PROTIME-INR
INR: 1.1
Prothrombin Time: 10.9 s (ref 9.0–11.5)

## 2020-12-20 LAB — AFP TUMOR MARKER: AFP-Tumor Marker: 10.3 ng/mL — ABNORMAL HIGH

## 2020-12-21 ENCOUNTER — Other Ambulatory Visit: Payer: Self-pay | Admitting: Internal Medicine

## 2020-12-21 DIAGNOSIS — N3000 Acute cystitis without hematuria: Secondary | ICD-10-CM

## 2020-12-21 LAB — URINE CULTURE
MICRO NUMBER:: 11621875
SPECIMEN QUALITY:: ADEQUATE

## 2020-12-21 LAB — URINALYSIS, ROUTINE W REFLEX MICROSCOPIC
Bacteria, UA: NONE SEEN /HPF
Bilirubin Urine: NEGATIVE
Glucose, UA: NEGATIVE
Hyaline Cast: NONE SEEN /LPF
Ketones, ur: NEGATIVE
Leukocytes,Ua: NEGATIVE
Nitrite: NEGATIVE
Protein, ur: NEGATIVE
Specific Gravity, Urine: 1.011 (ref 1.001–1.03)
pH: 5.5 (ref 5.0–8.0)

## 2020-12-21 MED ORDER — AMOXICILLIN 250 MG PO CAPS: ORAL_CAPSULE | ORAL | 0 refills | Status: DC

## 2020-12-21 NOTE — Progress Notes (Signed)
===========================================================  -   U/C is (+) for UTI   - Rx Amoxil 3 x /day  x 10 days sent to Drug store  - Please schedule NV for U/A & C/S in about 1 month

## 2020-12-27 DIAGNOSIS — K760 Fatty (change of) liver, not elsewhere classified: Secondary | ICD-10-CM | POA: Diagnosis not present

## 2020-12-27 DIAGNOSIS — K7402 Hepatic fibrosis, advanced fibrosis: Secondary | ICD-10-CM | POA: Diagnosis not present

## 2021-01-01 ENCOUNTER — Other Ambulatory Visit: Payer: Self-pay | Admitting: Internal Medicine

## 2021-01-01 MED ORDER — ROSUVASTATIN CALCIUM 5 MG PO TABS
ORAL_TABLET | ORAL | 1 refills | Status: DC
Start: 2021-01-01 — End: 2021-04-04

## 2021-01-03 ENCOUNTER — Encounter: Payer: Self-pay | Admitting: Gastroenterology

## 2021-01-03 ENCOUNTER — Ambulatory Visit (AMBULATORY_SURGERY_CENTER): Payer: PPO | Admitting: Gastroenterology

## 2021-01-03 ENCOUNTER — Other Ambulatory Visit: Payer: Self-pay

## 2021-01-03 VITALS — BP 154/77 | HR 72 | Temp 97.9°F | Resp 18 | Ht 65.0 in | Wt 173.0 lb

## 2021-01-03 DIAGNOSIS — Z1211 Encounter for screening for malignant neoplasm of colon: Secondary | ICD-10-CM

## 2021-01-03 DIAGNOSIS — F319 Bipolar disorder, unspecified: Secondary | ICD-10-CM | POA: Diagnosis not present

## 2021-01-03 DIAGNOSIS — F259 Schizoaffective disorder, unspecified: Secondary | ICD-10-CM | POA: Diagnosis not present

## 2021-01-03 DIAGNOSIS — D125 Benign neoplasm of sigmoid colon: Secondary | ICD-10-CM

## 2021-01-03 DIAGNOSIS — K640 First degree hemorrhoids: Secondary | ICD-10-CM

## 2021-01-03 DIAGNOSIS — Z800 Family history of malignant neoplasm of digestive organs: Secondary | ICD-10-CM

## 2021-01-03 DIAGNOSIS — K7460 Unspecified cirrhosis of liver: Secondary | ICD-10-CM | POA: Diagnosis not present

## 2021-01-03 DIAGNOSIS — K5730 Diverticulosis of large intestine without perforation or abscess without bleeding: Secondary | ICD-10-CM

## 2021-01-03 DIAGNOSIS — I10 Essential (primary) hypertension: Secondary | ICD-10-CM | POA: Diagnosis not present

## 2021-01-03 MED ORDER — SODIUM CHLORIDE 0.9 % IV SOLN: 500.0000 mL | Freq: Once | INTRAVENOUS | Status: DC

## 2021-01-03 NOTE — Progress Notes (Signed)
VS taken by C.W. 

## 2021-01-03 NOTE — Patient Instructions (Signed)
YOU HAD AN ENDOSCOPIC PROCEDURE TODAY AT THE [facility]:   Refer to the procedure report that was given to you for any specific questions about what was found during the examination.  If the procedure report does not answer your questions, please call your gastroenterologist to clarify.  If you requested that your care partner not be given the details of your procedure findings, then the procedure report has been included in a sealed envelope for you to review at your convenience later.  YOU SHOULD EXPECT: Some feelings of bloating in the abdomen. Passage of more gas than usual.  Walking can help get rid of the air that was put into your GI tract during the procedure and reduce the bloating. If you had a lower endoscopy (such as a colonoscopy or flexible sigmoidoscopy) you may notice spotting of blood in your stool or on the toilet paper. If you underwent a bowel prep for your procedure, you may not have a normal bowel movement for a few days.  Please Note:  You might notice some irritation and congestion in your nose or some drainage.  This is from the oxygen used during your procedure.  There is no need for concern and it should clear up in a day or so.  SYMPTOMS TO REPORT IMMEDIATELY:   Following lower endoscopy (colonoscopy or flexible sigmoidoscopy):  Excessive amounts of blood in the stool  Significant tenderness or worsening of abdominal pains  Swelling of the abdomen that is new, acute  Fever of 100F or higher   Following upper endoscopy (EGD)  Vomiting of blood or coffee ground material  New chest pain or pain under the shoulder blades  Painful or persistently difficult swallowing  New shortness of breath  Fever of 100F or higher  Black, tarry-looking stools  For urgent or emergent issues, a gastroenterologist can be reached at any hour by calling [phone]. Do not use MyChart messaging for urgent concerns.    DIET:  We do recommend a small meal at first, but  then you may proceed to your regular diet.  Drink plenty of fluids but you should avoid alcoholic beverages for 24 hours.  ACTIVITY:  You should plan to take it easy for the rest of today and you should NOT DRIVE or use heavy machinery until tomorrow (because of the sedation medicines used during the test).    FOLLOW UP: Our staff will call the number listed on your records 48-72 hours following your procedure to check on you and address any questions or concerns that you may have regarding the information given to you following your procedure. If we do not reach you, we will leave a message.  We will attempt to reach you two times.  During this call, we will ask if you have developed any symptoms of COVID 19. If you develop any symptoms (ie: fever, flu-like symptoms, shortness of breath, cough etc.) before then, please call [phone].  If you test positive for Covid 19 in the 2 weeks post procedure, please call and report this information to us.    If any biopsies were taken you will be contacted by phone or by letter within the next 1-3 weeks.  Please call us at [phone] if you have not heard about the biopsies in 3 weeks.    SIGNATURES/CONFIDENTIALITY: You and/or your care partner have signed paperwork which will be entered into your electronic medical record.  These signatures attest to the fact that that the information above on   your After Visit Summary has been reviewed and is understood.  Full responsibility of the confidentiality of this discharge information lies with you and/or your care-partner. 

## 2021-01-03 NOTE — Op Note (Signed)
[facility] Patient Name: Rebekah Pace Procedure Date: 01/03/2021 8:54 AM MRN: [id number] Endoscopist: Gerrit Heck , MD Age: 65 Referring MD:  Date of Birth: Dec 01, 1955 Gender: Female Account #: [id number] Procedure:                Colonoscopy Indications:              Screening for colorectal malignant neoplasm. Last                            colonoscopy was approximately 20 years ago and                            normal per patient. Family history notable for                            Colorectal cancer in mother, diagnosed in her 53's.                            She is otherwise without GI symptoms. Medicines:                Monitored Anesthesia Care Procedure:                Pre-Anesthesia Assessment:                           - Prior to the procedure, a History and Physical                            was performed, and patient medications and                            allergies were reviewed. The patient's tolerance of                            previous anesthesia was also reviewed. The risks                            and benefits of the procedure and the sedation                            options and risks were discussed with the patient.                            All questions were answered, and informed consent                            was obtained. Prior Anticoagulants: The patient has                            taken no previous anticoagulant or antiplatelet                            agents. ASA Grade Assessment: III - A patient with  severe systemic disease. After reviewing the risks                            and benefits, the patient was deemed in                            satisfactory condition to undergo the procedure.                           After obtaining informed consent, the colonoscope                            was passed under direct vision. Throughout the                            procedure, the patient's  blood pressure, pulse, and                            oxygen saturations were monitored continuously. The                            Olympus CF-HQ190L [id number]) Colonoscope was                            introduced through the anus and advanced to the the                            terminal ileum. The colonoscopy was performed                            without difficulty. The patient tolerated the                            procedure well. The quality of the bowel                            preparation was good. The terminal ileum, ileocecal                            valve, appendiceal orifice, and rectum were                            photographed. Scope In: 9:08:32 AM Scope Out: 9:23:27 AM Scope Withdrawal Time: 0 hours 11 minutes 1 second  Total Procedure Duration: 0 hours 14 minutes 55 seconds  Findings:                 The perianal and digital rectal examinations were                            normal.                           Two sessile polyps were found in the sigmoid colon.  The polyps were 3 to 4 mm in size. These polyps                            were removed with a cold snare. Resection and                            retrieval were complete. Estimated blood loss was                            minimal.                           Multiple small and large-mouthed diverticula were                            found in the sigmoid colon.                           Non-bleeding internal hemorrhoids were found during                            retroflexion. The hemorrhoids were small.                           The exam was otherwise normal throughout the                            remainder of the colon.                           The terminal ileum appeared normal. Complications:            No immediate complications. Estimated Blood Loss:     Estimated blood loss was minimal. Impression:               - Two 3 to 4 mm polyps in the sigmoid colon,                             removed with a cold snare. Resected and retrieved.                           - Diverticulosis in the sigmoid colon.                           - Non-bleeding internal hemorrhoids.                           - The examined portion of the ileum was normal. Recommendation:           - Patient has a contact number available for                            emergencies. The signs and symptoms of potential                            delayed complications were  discussed with the                            patient. Return to normal activities tomorrow.                            Written discharge instructions were provided to the                            patient.                           - Resume previous diet.                           - Continue present medications.                           - Await pathology results.                           - Repeat colonoscopy for surveillance based on                            pathology results.                           - Return to GI office PRN.                           - Use fiber, for example Citrucel, Fibercon, Konsyl                            or Metamucil. Gerrit Heck, MD 01/03/2021 9:28:20 AM

## 2021-01-03 NOTE — Progress Notes (Signed)
Called to room to assist during endoscopic procedure.  Patient ID and intended procedure confirmed with present staff. Received instructions for my participation in the procedure from the performing physician.  

## 2021-01-03 NOTE — Progress Notes (Signed)
approx 0920 pt started gupping. Sedation paused.  Suctioning started initially small amounts of clear secretions.  Pt then started producing bile colored fluid.  HOB dropped to steep t-burg and suction continued until pt awake enough to protect own airway  sats never dropped  VSS in recovery and BS clear.  Pt and PACU RN made aware of event

## 2021-01-03 NOTE — Progress Notes (Signed)
Pt's states no medical or surgical changes since previsit or office visit. 

## 2021-01-05 ENCOUNTER — Telehealth: Payer: Self-pay

## 2021-01-05 NOTE — Telephone Encounter (Signed)
  Follow up Call-  Call back number 01/03/2021  Post procedure Call Back phone  # [phone]  Permission to leave phone message Yes  Some recent data might be hidden     Patient questions:  Do you have a fever, pain , or abdominal swelling? No. Pain Score  0 *  Have you tolerated food without any problems? Yes.    Have you been able to return to your normal activities? Yes.    Do you have any questions about your discharge instructions: Diet   No. Medications  No. Follow up visit  No.  Do you have questions or concerns about your Care? No.  Actions: * If pain score is 4 or above: No action needed, pain <4.  1. Have you developed a fever since your procedure? no  2.   Have you had an respiratory symptoms (SOB or cough) since your procedure? no  3.   Have you tested positive for COVID 19 since your procedure no  4.   Have you had any family members/close contacts diagnosed with the COVID 19 since your procedure?  no   If yes to any of these questions please route to Joylene John, RN and Joella Prince, RN

## 2021-01-11 ENCOUNTER — Encounter: Payer: Self-pay | Admitting: Gastroenterology

## 2021-01-22 ENCOUNTER — Other Ambulatory Visit: Payer: Self-pay

## 2021-01-22 ENCOUNTER — Ambulatory Visit (INDEPENDENT_AMBULATORY_CARE_PROVIDER_SITE_OTHER): Payer: PPO

## 2021-01-22 DIAGNOSIS — N3000 Acute cystitis without hematuria: Secondary | ICD-10-CM

## 2021-01-22 NOTE — Progress Notes (Signed)
Patient presents to the office for a nurse visit to recheck a urine specimen for previous treatment of a UTI. No signs or symptoms at all. No other questions or concerns. Vitals taken and recorded.

## 2021-01-24 ENCOUNTER — Other Ambulatory Visit: Payer: Self-pay | Admitting: Internal Medicine

## 2021-01-24 DIAGNOSIS — N3000 Acute cystitis without hematuria: Secondary | ICD-10-CM

## 2021-01-24 LAB — URINE CULTURE
MICRO NUMBER:: 11755583
SPECIMEN QUALITY:: ADEQUATE

## 2021-01-24 LAB — URINALYSIS, ROUTINE W REFLEX MICROSCOPIC
Bacteria, UA: NONE SEEN /HPF
Bilirubin Urine: NEGATIVE
Glucose, UA: NEGATIVE
Hyaline Cast: NONE SEEN /LPF
Ketones, ur: NEGATIVE
Nitrite: NEGATIVE
Protein, ur: NEGATIVE
Specific Gravity, Urine: 1.014 (ref 1.001–1.03)
pH: <=5 (ref 5.0–8.0)

## 2021-01-24 LAB — MICROSCOPIC MESSAGE

## 2021-01-24 MED ORDER — AMOXICILLIN 250 MG PO CAPS: ORAL_CAPSULE | ORAL | 0 refills | Status: DC

## 2021-01-24 NOTE — Progress Notes (Signed)
============================================================ ============================================================  -    U/C finally returned & was (+) for UTI, so sent in Rx to your Drug store  - Please schedule a NV for U/A & C/S is 1 month

## 2021-01-31 DIAGNOSIS — F250 Schizoaffective disorder, bipolar type: Secondary | ICD-10-CM | POA: Diagnosis not present

## 2021-01-31 DIAGNOSIS — F5101 Primary insomnia: Secondary | ICD-10-CM | POA: Diagnosis not present

## 2021-02-16 DIAGNOSIS — I700 Atherosclerosis of aorta: Secondary | ICD-10-CM | POA: Insufficient documentation

## 2021-02-16 NOTE — Progress Notes (Signed)
Assessment and Plan:  Rebekah Pace was seen today for hematuria.  Diagnoses and all orders for this visit:  Hematuria, persistent microscopic Frequent UTI Persistent over last 2 years, also intermittent blood on tissue, episode of mild frank hematuria Has had CT abd w/wo 06/2020 was negative, didn't have pelvis at that time as not dedicated study for hematuria Discussed imaging and uro referral for cysto; patient in agreement to proceed with specialist evaluation Discussed topical estrogen for frequent UTI following menopause, she is concerned about risks with this, wants to defer to urology.  Rechecking urine for resolution of recent UTI treated by Dr. Melford Aase -     Urinalysis w microscopic + reflex cultur -     Ambulatory referral to Urology  Aortic atherosclerosis (Rebekah Pace) per CT 06/2020 Control blood pressure, cholesterol, glucose, increase exercise.    Further disposition pending results of labs. Discussed med's effects and SE's.   Over 30 minutes of exam, counseling, chart review, and critical decision making was performed.   Future Appointments  Date Time Provider [place]  05/22/2021 11:00 AM [name], NP [facility] None  11/28/2021 11:00 AM Unk Pinto, MD [facility] None    ------------------------------------------------------------------------------------------------------------------   HPI 65 y.o.female presents for evaluation of hematuria and concern with recurrent UTIs.   She reports in the last 6 months she has had intermittently noted blood on toilet paper when she wipes after urination, occasionally some pink urine, had 1 episode of clots with sediment. Reports will have 1 day of bright red blood when she wipes after urinating. Denies blood on underwear. She has had mild (trace to 1+) hbg in urine on UA, persistent since 03/21/2016, RBC 3-10 range intermittently.   Most recently had + culture, strep agalactiae and Dr. Melford Aase prescribed amoxicillin 250 mg  TID and presents for recheck as well. Reports has had increased UTIs in last 2 years, concerned about over treatment.   She had unremarkable CT renal stone study 01/28/2017, also recent CT abd w/wo contrast 06/22/2020 showed no masses of the urinary tract.   Stable baseline CKD IIIa Lab Results  Component Value Date   GFRNONAA 55 (L) 11/17/2020   GFRNONAA 52 (L) 06/05/2020   GFRNONAA 60 03/02/2020     Past Medical History:  Diagnosis Date  . Anxiety   . Depression   . Gout 05/17/2016  . Hepatitis   . Hypothyroidism   . Labile hypertension   . Mental disorder   . Prediabetes   . Thyroid disease      Allergies  Allergen Reactions  . Sulfa Antibiotics Rash    Current Outpatient Medications on File Prior to Visit  Medication Sig  . ARIPiprazole (ABILIFY) 10 MG tablet Take 10 mg by mouth daily.  . bisoprolol-hydrochlorothiazide (ZIAC) 10-6.25 MG tablet Take 1 tablet every Morning for BP  . levothyroxine (SYNTHROID) 75 MCG tablet TAKE 1 TABLET BY MOUTH DAILY ON EMPTY STOMACH WITH ONLY WATER FOR 30 MINUTES& NO ANTACIDS, CALCIUM OR MAGNESIUM FOR 4 HOURS. AVOID BIOTIN  . MAGNESIUM PO Take 500 mg by mouth.   . rosuvastatin (CRESTOR) 5 MG tablet Take  1 tablet  Daily  for Cholesterol  . traZODone (DESYREL) 50 MG tablet 1/2-1 tablet for sleep  . VITAMIN D, CHOLECALCIFEROL, PO Take 5,000 Units by mouth 2 (two) times daily.   Marland Kitchen amoxicillin (AMOXIL) 250 MG capsule Take 1 capsule 3 x /day with meals for infection   No current facility-administered medications on file prior to visit.    ROS: all negative  except above.   Physical Exam:  BP 120/74   Pulse (!) 53   Temp (!) 97.3 F (36.3 C)   Wt 173 lb (78.5 kg)   SpO2 97%   BMI 28.79 kg/m   General Appearance: Well nourished, in no apparent distress. Eyes: PERRLA, conjunctiva no swelling or erythema ENT/Mouth: Mask in place; Hearing normal.  Neck: Supple, thyroid normal.  Respiratory: Respiratory effort normal, BS equal  bilaterally without rales, rhonchi, wheezing or stridor.  Cardio: RRR with no MRGs. Brisk peripheral pulses without edema.  Abdomen: Soft, + BS.  Non tender, no guarding, rebound, hernias, masses. Lymphatics: Non tender without lymphadenopathy.  Musculoskeletal:  normal gait.  Skin: Warm, dry without rashes, lesions, ecchymosis.  Neuro: Normal muscle tone  Psych: Awake and oriented X 3, flat affect, Insight and Judgment appropriate.  GU: Female genitalia: Bladder no bladder distension noted Urethra: not indicated and normal appearing urethra with no masses, tenderness or lesions Clitoris: normal appearing clitoris with no hypertrophy or enlargement Vulva: normal appearing vulva with no masses, tenderness or lesions Vagina: atrophic  Limited pelvic exam; had normal full pelvic 06/2020, neg PAP/HPV   Rebekah Ribas, NP 1:15 PM [facility]

## 2021-02-19 ENCOUNTER — Ambulatory Visit (INDEPENDENT_AMBULATORY_CARE_PROVIDER_SITE_OTHER): Payer: PPO | Admitting: Adult Health

## 2021-02-19 ENCOUNTER — Encounter: Payer: Self-pay | Admitting: Adult Health

## 2021-02-19 ENCOUNTER — Other Ambulatory Visit: Payer: Self-pay

## 2021-02-19 VITALS — BP 120/74 | HR 53 | Temp 97.3°F | Wt 173.0 lb

## 2021-02-19 DIAGNOSIS — R3129 Other microscopic hematuria: Secondary | ICD-10-CM | POA: Diagnosis not present

## 2021-02-19 DIAGNOSIS — I700 Atherosclerosis of aorta: Secondary | ICD-10-CM | POA: Diagnosis not present

## 2021-02-19 DIAGNOSIS — R319 Hematuria, unspecified: Secondary | ICD-10-CM | POA: Diagnosis not present

## 2021-02-19 DIAGNOSIS — N390 Urinary tract infection, site not specified: Secondary | ICD-10-CM

## 2021-02-19 NOTE — Patient Instructions (Addendum)
Conjugated Estrogens vaginal cream What is this medicine? CONJUGATED ESTROGENS (CON ju gate ed ESS troe jenz) are a mixture of female hormones. This cream can help relieve symptoms associated with menopause.like vaginal dryness and irritation. This medicine may be used for other purposes; ask your health care provider or pharmacist if you have questions. COMMON BRAND NAME(S): Premarin What should I tell my health care provider before I take this medicine? They need to know if you have any of these conditions:  abnormal vaginal bleeding  blood vessel disease or blood clots  breast, cervical, endometrial, or uterine cancer  dementia  diabetes  gallbladder disease  heart disease or recent heart attack  high blood pressure  high cholesterol  high level of calcium in the blood  hysterectomy  kidney disease  liver disease  migraine headaches  protein C deficiency  protein S deficiency  stroke  systemic lupus erythematosus (SLE)  tobacco smoker  an unusual or allergic reaction to estrogens other medicines, foods, dyes, or preservatives  pregnant or trying to get pregnant  breast-feeding How should I use this medicine? This medicine is for use in the vagina only. Do not take by mouth. Follow the directions on the prescription label. Use at bedtime unless otherwise directed by your doctor or health care professional. Use the special applicator supplied with the cream. Wash hands before and after use. Fill the applicator with the cream and remove from the tube. Lie on your back, part and bend your knees. Insert the applicator into the vagina and push the plunger to expel the cream into the vagina. Wash the applicator with warm soapy water and rinse well. Use exactly as directed for the complete length of time prescribed. Do not stop using except on the advice of your doctor or health care professional. Talk to your pediatrician regarding the use of this medicine in  children. Special care may be needed. A patient package insert for the product will be given with each prescription and refill. Read this sheet carefully each time. The sheet may change frequently. Overdosage: If you think you have taken too much of this medicine contact a poison control center or emergency room at once. NOTE: This medicine is only for you. Do not share this medicine with others. What if I miss a dose? If you miss a dose, use it as soon as you can. If it is almost time for your next dose, use only that dose. Do not use double or extra doses. What may interact with this medicine? Do not take this medicine with any of the following medications:  aromatase inhibitors like aminoglutethimide, anastrozole, exemestane, letrozole, testolactone This medicine may also interact with the following medications:  barbiturates used for inducing sleep or treating seizures  carbamazepine  grapefruit juice  medicines for fungal infections like itraconazole and ketoconazole  raloxifene or tamoxifen  rifabutin  rifampin  rifapentine  ritonavir  some antibiotics used to treat infections  St. John's Wort  warfarin This list may not describe all possible interactions. Give your health care provider a list of all the medicines, herbs, non-prescription drugs, or dietary supplements you use. Also tell them if you smoke, drink alcohol, or use illegal drugs. Some items may interact with your medicine. What should I watch for while using this medicine? Visit your health care professional for regular checks on your progress. You will need a regular breast and pelvic exam. You should also discuss the need for regular mammograms with your health care professional, and  follow his or her guidelines. This medicine can make your body retain fluid, making your fingers, hands, or ankles swell. Your blood pressure can go up. Contact your doctor or health care professional if you feel you are  retaining fluid. If you have any reason to think you are pregnant; stop taking this medicine at once and contact your doctor or health care professional. Tobacco smoking increases the risk of getting a blood clot or having a stroke, especially if you are more than 66 years old. You are strongly advised not to smoke. If you wear contact lenses and notice visual changes, or if the lenses begin to feel uncomfortable, consult your eye care specialist. If you are going to have elective surgery, you may need to stop taking this medicine beforehand. Consult your health care professional for advice prior to scheduling the surgery. What side effects may I notice from receiving this medicine? Side effects that you should report to your doctor or health care professional as soon as possible:  allergic reactions like skin rash, itching or hives, swelling of the face, lips, or tongue  breast tissue changes or discharge  changes in vision  chest pain  confusion, trouble speaking or understanding  dark urine  general ill feeling or flu-like symptoms  light-colored stools  nausea, vomiting  pain, swelling, warmth in the leg  right upper belly pain  severe headaches  shortness of breath  sudden numbness or weakness of the face, arm or leg  trouble walking, dizziness, loss of balance or coordination  unusual vaginal bleeding  yellowing of the eyes or skin Side effects that usually do not require medical attention (report to your doctor or health care professional if they continue or are bothersome):  hair loss  increased hunger or thirst  increased urination  symptoms of vaginal infection like itching, irritation or unusual discharge  unusually weak or tired This list may not describe all possible side effects. Call your doctor for medical advice about side effects. You may report side effects to FDA at 1-800-FDA-1088. Where should I keep my medicine? Keep out of the reach of  children. Store at room temperature between 15 and 30 degrees C (59 and 86 degrees F). Throw away any unused medicine after the expiration date. NOTE: This sheet is a summary. It may not cover all possible information. If you have questions about this medicine, talk to your doctor, pharmacist, or health care provider.  2021 Elsevier/Gold Standard (2011-01-02 09:20:36)     Hematuria, Adult Hematuria is blood in the urine. Blood may be visible in the urine, or it may be identified with a test. This condition can be caused by infections of the bladder, urethra, kidney, or prostate. Other possible causes include:  Kidney stones.  Cancer of the urinary tract.  Too much calcium in the urine.  Conditions that are passed from parent to child (inherited conditions).  Exercise that requires a lot of energy. Infections can usually be treated with medicine, and a kidney stone usually will pass through your urine. If neither of these is the cause of your hematuria, more tests may be needed to identify the cause of your symptoms. It is very important to tell your health care provider about any blood in your urine, even if it is painless or the blood stops without treatment. Blood in the urine, when it happens and then stops and then happens again, can be a symptom of a very serious condition, including cancer. There is no pain in  the initial stages of many urinary cancers. Follow these instructions at home: Medicines  Take over-the-counter and prescription medicines only as told by your health care provider.  If you were prescribed an antibiotic medicine, take it as told by your health care provider. Do not stop taking the antibiotic even if you start to feel better. Eating and drinking  Drink enough fluid to keep your urine pale yellow. It is recommended that you drink 3-4 quarts (2.8-3.8 L) a day. If you have been diagnosed with an infection, drinking cranberry juice in addition to large amounts  of water is recommended.  Avoid caffeine, tea, and carbonated beverages. These tend to irritate the bladder.  Avoid alcohol because it may irritate the prostate (in males). General instructions  If you have been diagnosed with a kidney stone, follow your health care provider's instructions about straining your urine to catch the stone.  Empty your bladder often. Avoid holding urine for long periods of time.  If you are female: ? After a bowel movement, wipe from front to back and use each piece of toilet paper only once. ? Empty your bladder before and after sex.  Pay attention to any changes in your symptoms. Tell your health care provider about any changes or any new symptoms.  It is up to you to get the results of any tests. Ask your health care provider, or the department that is doing the test, when your results will be ready.  Keep all follow-up visits. This is important. Contact a health care provider if:  You develop back pain.  You have a fever or chills.  You have nausea or vomiting.  Your symptoms do not improve after 3 days.  Your symptoms get worse. Get help right away if:  You develop severe vomiting and are unable to take medicine without vomiting.  You develop severe pain in your back or abdomen even though you are taking medicine.  You pass a large amount of blood in your urine.  You pass blood clots in your urine.  You feel very weak or like you might faint.  You faint. Summary  Hematuria is blood in the urine. It has many possible causes.  It is very important that you tell your health care provider about any blood in your urine, even if it is painless or the blood stops without treatment.  Take over-the-counter and prescription medicines only as told by your health care provider.  Drink enough fluid to keep your urine pale yellow. This information is not intended to replace advice given to you by your health care provider. Make sure you discuss  any questions you have with your health care provider. Document Revised: 05/31/2020 Document Reviewed: 05/31/2020 Elsevier Patient Education  2021 [employer].

## 2021-02-21 LAB — URINALYSIS W MICROSCOPIC + REFLEX CULTURE
Bacteria, UA: NONE SEEN /HPF
Bilirubin Urine: NEGATIVE
Glucose, UA: NEGATIVE
Hyaline Cast: NONE SEEN /LPF
Ketones, ur: NEGATIVE
Nitrites, Initial: NEGATIVE
Protein, ur: NEGATIVE
Specific Gravity, Urine: 1.011 (ref 1.001–1.035)
Squamous Epithelial / HPF: NONE SEEN /HPF (ref <=5)
pH: 5.5 (ref 5.0–8.0)

## 2021-02-21 LAB — URINE CULTURE
MICRO NUMBER:: 11869252
SPECIMEN QUALITY:: ADEQUATE

## 2021-02-21 LAB — CULTURE INDICATED

## 2021-02-23 ENCOUNTER — Ambulatory Visit: Payer: PPO

## 2021-03-07 ENCOUNTER — Other Ambulatory Visit: Payer: Self-pay | Admitting: Adult Health

## 2021-03-07 DIAGNOSIS — E039 Hypothyroidism, unspecified: Secondary | ICD-10-CM

## 2021-03-22 ENCOUNTER — Other Ambulatory Visit: Payer: Self-pay | Admitting: Internal Medicine

## 2021-04-03 DIAGNOSIS — R311 Benign essential microscopic hematuria: Secondary | ICD-10-CM | POA: Diagnosis not present

## 2021-04-04 ENCOUNTER — Other Ambulatory Visit: Payer: Self-pay | Admitting: Internal Medicine

## 2021-05-01 DIAGNOSIS — F250 Schizoaffective disorder, bipolar type: Secondary | ICD-10-CM | POA: Diagnosis not present

## 2021-05-01 DIAGNOSIS — F5101 Primary insomnia: Secondary | ICD-10-CM | POA: Diagnosis not present

## 2021-05-18 DIAGNOSIS — K7581 Nonalcoholic steatohepatitis (NASH): Secondary | ICD-10-CM | POA: Insufficient documentation

## 2021-05-21 ENCOUNTER — Other Ambulatory Visit: Payer: Self-pay | Admitting: Nurse Practitioner

## 2021-05-21 DIAGNOSIS — K7400 Hepatic fibrosis, unspecified: Secondary | ICD-10-CM

## 2021-05-21 NOTE — Progress Notes (Addendum)
Patient ID: Rebekah Pace, female   DOB: 03/04/1956, 65 y.o.   MRN: [id number]  Medicare  Annual  Wellness Visit And follow up  Assessment:  Medicare annual wellness visit       - Due annually  Essential hypertension -     CBC with Differential/Platelet - - continue medications, DASH diet, exercise and monitor at home. Call if greater than 130/80.    Hyperlipidemia, mixed -     Lipid panel - Continue medication and diet  Abnormal glucose -     Hemoglobin A1c  Vitamin D deficiency -     VITAMIN D 25 Hydroxy (Vit-D Deficiency, Fractures)  Bipolar affective disorder, depressed, severe, with psychotic behavior (HCC)       - Stable on Abilify  Hypothyroidism, unspecified type -     TSH  Stage 3a chronic kidney disease ([place])      - Continue to control blood pressure, push fluids  Aortic atherosclerosis ([place]) per CT 06/2020       - Control blood pressure, cholesterol, glucose, increase exercise.   Medication management -     CBC with Differential/Platelet -     Lipid panel -     TSH -     Hemoglobin A1c -     VITAMIN D 25 Hydroxy (Vit-D Deficiency, Fractures) -     Magnesium -     Urinalysis, Routine w reflex microscopic -     Microalbumin / creatinine urine ratio  Screening for hematuria or proteinuria -     Urinalysis, Routine w reflex microscopic -     Microalbumin / creatinine urine ratio  Hepatic fibrosis -     Hepatic function panel -     APTT -     AFP Tumor Marker -     Sodium -     Creatinine, serum - Continue to follow with Dr. Zollie Scale and Roosevelt Locks NP   Future Appointments  Date Time Provider [place]  06/25/2021 10:15 AM [facility] [place] [facility]  11/28/2021 11:00 AM Unk Pinto, MD [facility] None    Plan:   During the course of the visit the patient was educated and counseled about appropriate screening and preventive services including:   Pneumococcal vaccine  Influenza vaccine Td vaccine Screening electrocardiogram Bone  densitometry screening Colorectal cancer screening Diabetes screening Glaucoma screening Nutrition counseling  Advanced directives: requested   Subjective:   Rebekah Pace, 65 y.o. DWF,  presents for Medicare Annual Wellness Visit and follow up  for follow up with Hypertension, Hyperlipidemia, Pre-Diabetes, Hypothyroidism  and Vitamin D Deficiency.   She has been following with Dr. Waunita Schooner. [facility] for [facility] C, treated with epclusa and in remission since. She has NASH/hepatic fibrosis and is following with them.   In the spring of 2013 she had a psychotic break, has had several psych admissions, Dx'd Bipolar Manic Depressive. Currently she is followed by [facility] by a staff therapist & Psychiatrist to manage her psychotropic medications. She is on Abilify and reports she typically well with current regimen. She is taking trazodone 50 mg at night for sleep.    BMI is Body mass index is 28.19 kg/m., she has been working on diet and exercise. Wt Readings from Last 3 Encounters:  05/22/21 169 lb 6.4 oz (76.8 kg)  02/19/21 173 lb (78.5 kg)  01/22/21 171 lb (77.6 kg)   Colonoscopy 01/03/2021 Dr. Bryan Lemma - several polyps removed,  follow up in 5 years.  Her blood pressure has been controlled at home, today their BP is BP: 122/70  She does workout. She denies chest pain, shortness of breath, dizziness.   She is on cholesterol medication Rosuvastatin and denies myalgias. Her cholesterol is at goal. The cholesterol last visit was:   Lab Results  Component Value Date   CHOL 121 05/22/2021   HDL 46 (L) 05/22/2021   LDLCALC 59 05/22/2021   TRIG 79 05/22/2021   CHOLHDL 2.6 05/22/2021    She has been working on diet and exercise for abnormal glucose . Last A1C in the office was:  Lab Results  Component Value Date   HGBA1C 5.0 05/22/2021   Lab Results  Component Value Date   VD25OH 72 05/22/2021   Allergies  Allergen Reactions    Sulfa Antibiotics Rash   Medication Review: Current Outpatient Medications on File Prior to Visit  Medication Sig   ARIPiprazole (ABILIFY) 10 MG tablet Take 10 mg by mouth daily.   bisoprolol-hydrochlorothiazide (ZIAC) 10-6.25 MG tablet Take  1 tablet   Daily  for BP   levothyroxine (SYNTHROID) 75 MCG tablet Take  1 tablet  Daily  on an empty stomach with only water for 30 minutes & no Antacid meds, Calcium or Magnesium for 4 hours & avoid Biotin   MAGNESIUM PO Take 500 mg by mouth.    rosuvastatin (CRESTOR) 5 MG tablet TAKE 1 TABLET BY MOUTH DAILY FOR CHOLESTEROL   traZODone (DESYREL) 50 MG tablet 1/2-1 tablet for sleep   VITAMIN D, CHOLECALCIFEROL, PO Take 5,000 Units by mouth 2 (two) times daily.    No current facility-administered medications on file prior to visit.   Review of Systems  Constitutional:  Negative for chills, fever and weight loss.  HENT:  Negative for congestion and hearing loss.   Eyes:  Negative for blurred vision and double vision.  Respiratory:  Negative for cough and shortness of breath.   Cardiovascular:  Negative for chest pain, palpitations, orthopnea and leg swelling.  Gastrointestinal:  Negative for abdominal pain, constipation, diarrhea, heartburn, nausea and vomiting.  Musculoskeletal:  Negative for falls, joint pain and myalgias.  Skin:  Negative for rash.  Neurological:  Negative for dizziness, tingling, tremors, loss of consciousness and headaches.  Psychiatric/Behavioral:  Negative for depression, memory loss and suicidal ideas.   Current Problems (verified) Patient Active Problem List   Diagnosis Date Noted   Aortic atherosclerosis ([place]) per CT 06/2020 02/16/2021   Stage 3a chronic kidney disease ([place]) 03/02/2020   Abnormal glucose 11/14/2019   Hematuria, microscopic 07/31/2018   Hepatic fibrosis 11/23/2016   Gout 05/17/2016   History of hepatitis C, s/p curative treatment 01/31/2016   Obesity (BMI 30.0-34.9) 07/24/2015   Hypothyroidism  12/13/2014   Hyperlipidemia, mixed 09/01/2014   Vitamin D deficiency 09/01/2014   Bipolar affective disorder, depressed, severe, with psychotic behavior ([place]) 09/01/2014   Medication management 03/30/2014   Labile hypertension    Schizoaffective disorder, bipolar type ([place]) 09/12/2011    Health Maintenance  Topic Date Due   Zoster (Shingles) Vaccine (1 of 2) Never done   Tetanus Vaccine  10/14/2005   Pneumonia vaccines (1 of 2 - PCV13) 05/21/2020   COVID-19 Vaccine (3 - Pfizer risk series) 06/23/2020   Cologuard (Stool DNA test)  06/24/2020   Flu Shot  05/14/2021   Mammogram  09/20/2021   DEXA scan (bone density measurement)  Completed   Hepatitis C Screening: USPSTF Recommendation to screen - Ages 48-79 yo.  Completed   HPV Vaccine  Aged Out   Immunization History  Administered Date(s) Administered   Influenza Inj Mdck Quad With Preservative 08/02/2019   PFIZER(Purple Top)SARS-COV-2 Vaccination 05/05/2020, 05/26/2020   Pneumococcal-Unspecified 10/14/2001   Td 10/15/1995    Preventative care: Last colonoscopy: Dr. Bryan Lemma 01/03/21- follow up in 5 years MGM 09/20/20- negative repeat 1 year [place] Ab 09/2015 CXR 2013 PAP 06/15/20 PAP and HPV negative Ct renal stone 01/2017  Influenza: 07/2019 Pneumococcal: 2003 Tetanus: 1997, patient will boost PRN due to cost Covid 19: [place] 05/05/20, 05/26/20  Names of Other Physician/Practitioners you currently use: 1. [facility] here for primary care 2. Dr Sherlean Foot, OD, eye doctor, last visit 2014, overdue, encouraged to schedule 3. Dr. Doreene Burke, dentist, last visit 2022  Patient Care Team: Unk Pinto, MD as PCP - General (Internal Medicine) Garrel Ridgel, DPM as Consulting Physician (Podiatry) Inda Castle, MD (Inactive) as Consulting Physician (Gastroenterology) Thalia Bloodgood, OD as Referring Physician (Optometry) Roosevelt Locks, CRNP as Nurse Practitioner (Nurse Practitioner) Zamor,  Ander Gaster, MD as Consulting Physician (Gastroenterology) Roosevelt Locks, CRNP as Nurse Practitioner (Gastroenterology)  Allergies Allergies  Allergen Reactions   Sulfa Antibiotics Rash    SURGICAL HISTORY She  has a past surgical history that includes arm fracture; Rotator cuff repair; and Colonoscopy (20 + years ago ). FAMILY HISTORY Her family history includes Cancer in her father; Colon cancer (age of onset: 43) in her mother; Colon polyps in her mother; Hyperlipidemia in her brother and mother; Hypertension in her mother. SOCIAL HISTORY She  reports that she has never smoked. She has never used smokeless tobacco. She reports that she does not drink alcohol and does not use drugs.  MEDICARE WELLNESS OBJECTIVES: Physical activity: Current Exercise Habits: Home exercise routine, Type of exercise: walking, Time (Minutes): 20, Frequency (Times/Week): 5, Weekly Exercise (Minutes/Week): 100 Cardiac risk factors: Cardiac Risk Factors include: advanced age (>66mn, >>32women);hypertension;dyslipidemia Depression/mood screen:   [facility] 05/24/2021  Decreased Interest 0  Down, Depressed, Hopeless 1  PHQ - 2 Score 1    ADLs:  In your present state of health, do you have any difficulty performing the following activities: 05/24/2021  Hearing? N  Vision? N  Difficulty concentrating or making decisions? N  Walking or climbing stairs? N  Dressing or bathing? N  Doing errands, shopping? N  Some recent data might be hidden     Cognitive Testing  Alert? Yes  Normal Appearance?Yes  Oriented to person? Yes  Place? Yes   Time? Yes  Recall of three objects?  Yes  Can perform simple calculations? Yes  Displays appropriate judgment?Yes  Can read the correct time from a watch face?Yes  EOL planning: Does Patient Have a Medical Advance Directive?: Yes Type of Advance Directive: HAmesDoes patient want to make changes to medical advance directive?: No -  Patient declined Copy of H[place] Chart?: No - copy requested   Objective:     BP 122/70   Pulse (!) 59   Temp (!) 97.3 F (36.3 C)   Wt 169 lb 6.4 oz (76.8 kg)   SpO2 93%   BMI 28.19 kg/m   General Appearance: Well nourished, alert, WD/WN, female and in no apparent distress. Eyes: PERRLA, EOMs, conjunctiva no swelling or erythema, normal fundi and vessels. Sinuses: No frontal/maxillary tenderness ENT/Mouth: EACs patent / TMs  nl. Nares clear without erythema, swelling, mucoid exudates. Oral hygiene is good. No  erythema, swelling, or exudate. Tongue normal, non-obstructing. Tonsils not swollen or erythematous. Hearing normal.  Neck: Supple, thyroid normal. No bruits, nodes or JVD. Respiratory: Respiratory effort normal.  BS equal and clear bilateral without rales, rhonci, wheezing or stridor. Cardio: Heart sounds are normal with regular rate and rhythm and no murmurs, rubs or gallops. Peripheral pulses are normal and equal bilaterally without edema. No aortic or femoral bruits. Chest: symmetric with normal excursions and percussion. Abdomen: Flat, soft  with nl bowel sounds. Diffuse tenderness, no guarding, rebound, hernias, masses, or organomegaly.  No CVA tendernss Lymphatics: Non tender without lymphadenopathy.  Musculoskeletal: Full ROM all peripheral extremities, joint stability, 5/5 strength, and normal gait. Skin: Warm and dry without rashes, lesions, cyanosis, clubbing or  ecchymosis.  Neuro: Cranial nerves intact, reflexes equal bilaterally. Normal muscle tone, no cerebellar symptoms. Sensation intact.  Pysch: Alert and oriented X 3, normal affect, Insight and Judgment appropriate.   Medicare Attestation I have personally reviewed: The patient's medical and social history Their use of alcohol, tobacco or illicit drugs Their current medications and supplements The patient's functional ability including ADLs,fall risks, home safety risks, cognitive,  and hearing and visual impairment Diet and physical activities Evidence for depression or mood disorders  The patient's weight, height, BMI, and visual acuity have been recorded in the chart.  I have made referrals, counseling, and provided education to the patient based on review of the above and I have provided the patient with a written personalized care plan for preventive services.  Over 40 minutes of exam, counseling, chart review was performed. Magda Bernheim ANP-C  [facility]  05/24/2021

## 2021-05-22 ENCOUNTER — Encounter: Payer: Self-pay | Admitting: Nurse Practitioner

## 2021-05-22 ENCOUNTER — Other Ambulatory Visit: Payer: Self-pay

## 2021-05-22 ENCOUNTER — Ambulatory Visit: Payer: PPO | Admitting: Adult Health Nurse Practitioner

## 2021-05-22 ENCOUNTER — Ambulatory Visit (INDEPENDENT_AMBULATORY_CARE_PROVIDER_SITE_OTHER): Payer: PPO | Admitting: Nurse Practitioner

## 2021-05-22 VITALS — BP 122/70 | HR 59 | Temp 97.3°F | Wt 169.4 lb

## 2021-05-22 DIAGNOSIS — R7309 Other abnormal glucose: Secondary | ICD-10-CM | POA: Diagnosis not present

## 2021-05-22 DIAGNOSIS — Z79899 Other long term (current) drug therapy: Secondary | ICD-10-CM | POA: Diagnosis not present

## 2021-05-22 DIAGNOSIS — E782 Mixed hyperlipidemia: Secondary | ICD-10-CM | POA: Diagnosis not present

## 2021-05-22 DIAGNOSIS — F315 Bipolar disorder, current episode depressed, severe, with psychotic features: Secondary | ICD-10-CM

## 2021-05-22 DIAGNOSIS — Z0001 Encounter for general adult medical examination with abnormal findings: Secondary | ICD-10-CM | POA: Diagnosis not present

## 2021-05-22 DIAGNOSIS — Z1389 Encounter for screening for other disorder: Secondary | ICD-10-CM

## 2021-05-22 DIAGNOSIS — I700 Atherosclerosis of aorta: Secondary | ICD-10-CM

## 2021-05-22 DIAGNOSIS — N1831 Chronic kidney disease, stage 3a: Secondary | ICD-10-CM | POA: Diagnosis not present

## 2021-05-22 DIAGNOSIS — E039 Hypothyroidism, unspecified: Secondary | ICD-10-CM | POA: Diagnosis not present

## 2021-05-22 DIAGNOSIS — E559 Vitamin D deficiency, unspecified: Secondary | ICD-10-CM

## 2021-05-22 DIAGNOSIS — K7400 Hepatic fibrosis, unspecified: Secondary | ICD-10-CM | POA: Diagnosis not present

## 2021-05-22 DIAGNOSIS — R6889 Other general symptoms and signs: Secondary | ICD-10-CM | POA: Diagnosis not present

## 2021-05-22 DIAGNOSIS — I10 Essential (primary) hypertension: Secondary | ICD-10-CM

## 2021-05-22 NOTE — Patient Instructions (Signed)

## 2021-05-23 ENCOUNTER — Other Ambulatory Visit: Payer: Self-pay | Admitting: Nurse Practitioner

## 2021-05-23 ENCOUNTER — Other Ambulatory Visit: Payer: PPO

## 2021-05-23 DIAGNOSIS — K7400 Hepatic fibrosis, unspecified: Secondary | ICD-10-CM | POA: Diagnosis not present

## 2021-05-23 DIAGNOSIS — N3000 Acute cystitis without hematuria: Secondary | ICD-10-CM

## 2021-05-23 LAB — URINALYSIS, ROUTINE W REFLEX MICROSCOPIC
Bilirubin Urine: NEGATIVE
Glucose, UA: NEGATIVE
Hyaline Cast: NONE SEEN /LPF
Ketones, ur: NEGATIVE
Nitrite: NEGATIVE
Protein, ur: NEGATIVE
Specific Gravity, Urine: 1.007 (ref 1.001–1.035)
Squamous Epithelial / HPF: NONE SEEN /HPF (ref <=5)
WBC, UA: >=60 /HPF — AB (ref 0–5)
pH: 5.5 (ref 5.0–8.0)

## 2021-05-23 LAB — CBC WITH DIFFERENTIAL/PLATELET
Absolute Monocytes: 454 cells/uL (ref 200–950)
Basophils Absolute: 63 cells/uL (ref 0–200)
Basophils Relative: 1 %
Eosinophils Absolute: 183 cells/uL (ref 15–500)
Eosinophils Relative: 2.9 %
HCT: 40 % (ref 35.0–45.0)
Hemoglobin: 13.4 g/dL (ref 11.7–15.5)
Lymphs Abs: 1569 cells/uL (ref 850–3900)
MCH: 29.7 pg (ref 27.0–33.0)
MCHC: 33.5 g/dL (ref 32.0–36.0)
MCV: 88.7 fL (ref 80.0–100.0)
MPV: 10.3 fL (ref 7.5–12.5)
Monocytes Relative: 7.2 %
Neutro Abs: 4032 cells/uL (ref 1500–7800)
Neutrophils Relative %: 64 %
Platelets: 199 10*3/uL (ref 140–400)
RBC: 4.51 10*6/uL (ref 3.80–5.10)
RDW: 12 % (ref 11.0–15.0)
Total Lymphocyte: 24.9 %
WBC: 6.3 10*3/uL (ref 3.8–10.8)

## 2021-05-23 LAB — LIPID PANEL
Cholesterol: 121 mg/dL (ref <200)
HDL: 46 mg/dL — ABNORMAL LOW (ref >=50)
LDL Cholesterol (Calc): 59 mg/dL (calc)
Non-HDL Cholesterol (Calc): 75 mg/dL (calc) (ref <130)
Total CHOL/HDL Ratio: 2.6 (calc) (ref <5.0)
Triglycerides: 79 mg/dL (ref <150)

## 2021-05-23 LAB — MICROALBUMIN / CREATININE URINE RATIO
Creatinine, Urine: 45 mg/dL (ref 20–275)
Microalb Creat Ratio: 9 mcg/mg creat (ref <30)
Microalb, Ur: 0.4 mg/dL

## 2021-05-23 LAB — HEMOGLOBIN A1C
Hgb A1c MFr Bld: 5 % of total Hgb (ref <5.7)
Mean Plasma Glucose: 97 mg/dL
eAG (mmol/L): 5.4 mmol/L

## 2021-05-23 LAB — VITAMIN D 25 HYDROXY (VIT D DEFICIENCY, FRACTURES): Vit D, 25-Hydroxy: 72 ng/mL (ref 30–100)

## 2021-05-23 LAB — CREATININE, SERUM: Creat: 1.08 mg/dL — ABNORMAL HIGH (ref 0.50–1.05)

## 2021-05-23 LAB — MICROSCOPIC MESSAGE

## 2021-05-23 LAB — SODIUM: Sodium: 137 mmol/L (ref 135–146)

## 2021-05-23 LAB — AFP TUMOR MARKER: AFP-Tumor Marker: 9.7 ng/mL — ABNORMAL HIGH

## 2021-05-23 LAB — TSH: TSH: 0.85 mIU/L (ref 0.40–4.50)

## 2021-05-23 LAB — MAGNESIUM: Magnesium: 2 mg/dL (ref 1.5–2.5)

## 2021-05-23 MED ORDER — NITROFURANTOIN MONOHYD MACRO 100 MG PO CAPS: 100.0000 mg | ORAL_CAPSULE | Freq: Two times a day (BID) | ORAL | 0 refills | Status: AC

## 2021-05-23 NOTE — Progress Notes (Signed)
Labs ordered.

## 2021-05-24 LAB — HEPATIC FUNCTION PANEL
AG Ratio: 1.8 (calc) (ref 1.0–2.5)
ALT: 13 U/L (ref 6–29)
AST: 16 U/L (ref 10–35)
Albumin: 4.1 g/dL (ref 3.6–5.1)
Alkaline phosphatase (APISO): 62 U/L (ref 37–153)
Bilirubin, Direct: 0.2 mg/dL (ref 0.0–0.2)
Globulin: 2.3 g/dL (calc) (ref 1.9–3.7)
Indirect Bilirubin: 0.5 mg/dL (calc) (ref 0.2–1.2)
Total Bilirubin: 0.7 mg/dL (ref 0.2–1.2)
Total Protein: 6.4 g/dL (ref 6.1–8.1)

## 2021-05-24 LAB — APTT: aPTT: 26 s (ref 23–32)

## 2021-05-25 LAB — URINE CULTURE
MICRO NUMBER:: 12226052
SPECIMEN QUALITY:: ADEQUATE

## 2021-05-26 ENCOUNTER — Encounter: Payer: Self-pay | Admitting: Adult Health

## 2021-05-26 DIAGNOSIS — N3020 Other chronic cystitis without hematuria: Secondary | ICD-10-CM | POA: Insufficient documentation

## 2021-06-25 ENCOUNTER — Ambulatory Visit
Admission: RE | Admit: 2021-06-25 | Discharge: 2021-06-25 | Disposition: A | Discharge status: Home / Self Care | Payer: PPO | Source: Ambulatory Visit | Attending: Nurse Practitioner | Admitting: Nurse Practitioner

## 2021-06-25 ENCOUNTER — Other Ambulatory Visit: Payer: PPO

## 2021-06-25 DIAGNOSIS — K824 Cholesterolosis of gallbladder: Secondary | ICD-10-CM | POA: Diagnosis not present

## 2021-06-25 DIAGNOSIS — K7400 Hepatic fibrosis, unspecified: Secondary | ICD-10-CM

## 2021-07-09 DIAGNOSIS — K7402 Hepatic fibrosis, advanced fibrosis: Secondary | ICD-10-CM | POA: Diagnosis not present

## 2021-07-09 DIAGNOSIS — K760 Fatty (change of) liver, not elsewhere classified: Secondary | ICD-10-CM | POA: Diagnosis not present

## 2021-10-16 ENCOUNTER — Other Ambulatory Visit: Payer: Self-pay

## 2021-10-16 ENCOUNTER — Encounter: Payer: Self-pay | Admitting: Nurse Practitioner

## 2021-10-16 ENCOUNTER — Ambulatory Visit (INDEPENDENT_AMBULATORY_CARE_PROVIDER_SITE_OTHER): Payer: PPO | Admitting: Nurse Practitioner

## 2021-10-16 VITALS — BP 130/70 | HR 65 | Temp 97.7°F | Wt 178.4 lb

## 2021-10-16 DIAGNOSIS — B182 Chronic viral hepatitis C: Secondary | ICD-10-CM

## 2021-10-16 DIAGNOSIS — R350 Frequency of micturition: Secondary | ICD-10-CM | POA: Diagnosis not present

## 2021-10-16 DIAGNOSIS — M62830 Muscle spasm of back: Secondary | ICD-10-CM | POA: Diagnosis not present

## 2021-10-16 MED ORDER — CYCLOBENZAPRINE HCL 5 MG PO TABS: 5.0000 mg | ORAL_TABLET | Freq: Three times a day (TID) | ORAL | 0 refills | Status: DC | PRN

## 2021-10-16 MED ORDER — PREDNISONE 20 MG PO TABS: ORAL_TABLET | ORAL | 0 refills | Status: AC

## 2021-10-16 NOTE — Progress Notes (Signed)
Assessment and Plan:  Rebekah Pace was seen today for acute visit.  Diagnoses and all orders for this visit:  Chronic hepatitis C without hepatic coma (HCC)  Back muscle spasm -     predniSONE (DELTASONE) 20 MG tablet; 3 tablets daily with food for 3 days, 2 tabs daily for 3 days, 1 tab a day for 5 days. -     cyclobenzaprine (FLEXERIL) 5 MG tablet; Take 1 tablet (5 mg total) by mouth 3 (three) times daily as needed for muscle spasms. Use heat and ice as needed, rest area.   Urinary frequency -     Urine Culture -     Urinalysis, Routine w reflex microscopic Will treat pending culture results      Further disposition pending results of labs. Discussed med's effects and SE's.   Over 30 minutes of exam, counseling, chart review, and critical decision making was performed.   Future Appointments  Date Time Provider [place]  11/28/2021 11:00 AM Unk Pinto, MD [facility] None    ------------------------------------------------------------------------------------------------------------------   HPI BP 130/70    Pulse 65    Temp 97.7 F (36.5 C)    Wt 178 lb 6.4 oz (80.9 kg)    SpO2 97%    BMI 29.69 kg/m  66 y.o.female presents for low back pain x 5-6 days . Describes the pain as an intermittent aching sensation of left lower back.  Denies dysuria and hematuria. Has noticed she is urinating more frequently.     Allergies  Allergen Reactions   Sulfa Antibiotics Rash    Current Outpatient Medications on File Prior to Visit  Medication Sig   ARIPiprazole (ABILIFY) 10 MG tablet Take 10 mg by mouth daily.   bisoprolol-hydrochlorothiazide (ZIAC) 10-6.25 MG tablet Take  1 tablet   Daily  for BP   levothyroxine (SYNTHROID) 75 MCG tablet Take  1 tablet  Daily  on an empty stomach with only water for 30 minutes & no Antacid meds, Calcium or Magnesium for 4 hours & avoid Biotin   MAGNESIUM PO Take 500 mg by mouth.    rosuvastatin (CRESTOR) 5 MG tablet TAKE 1 TABLET BY MOUTH  DAILY FOR CHOLESTEROL   traZODone (DESYREL) 50 MG tablet 1/2-1 tablet for sleep   VITAMIN D, CHOLECALCIFEROL, PO Take 5,000 Units by mouth 2 (two) times daily.    No current facility-administered medications on file prior to visit.    ROS: all negative except above.   Physical Exam:  BP 130/70    Pulse 65    Temp 97.7 F (36.5 C)    Wt 178 lb 6.4 oz (80.9 kg)    SpO2 97%    BMI 29.69 kg/m   General Appearance: Well nourished, in no apparent distress. Eyes: PERRLA, EOMs, conjunctiva no swelling or erythema Sinuses: No Frontal/maxillary tenderness ENT/Mouth: Ext aud canals clear, TMs without erythema, bulging. No erythema, swelling, or exudate on post pharynx.  Tonsils not swollen or erythematous. Hearing normal.  Neck: Supple, thyroid normal.  Respiratory: Respiratory effort normal, BS equal bilaterally without rales, rhonchi, wheezing or stridor.  Cardio: RRR with no MRGs. Brisk peripheral pulses without edema.  Abdomen: Soft, + BS.  Non tender, no guarding, rebound, hernias, masses. Lymphatics: Non tender without lymphadenopathy.  Musculoskeletal: Full ROM, 5/5 strength, normal gait. Muscle spasm noted on left latissimus dorsi in lumbar region  Skin: Warm, dry without rashes, lesions, ecchymosis.  Neuro: Cranial nerves intact. Normal muscle tone, no cerebellar symptoms. Sensation intact.  Psych: Awake and oriented X  3, normal affect, Insight and Judgment appropriate.     Rebekah Bernheim, NP 2:07 PM [facility]

## 2021-10-16 NOTE — Patient Instructions (Signed)
Muscle Cramps and Spasms Muscle cramps and spasms are when muscles tighten by themselves. They usually get better within minutes. Muscle cramps are painful. They are usually stronger and last longer than muscle spasms. Muscle spasms may or may not be painful. They can last a few seconds or much longer. Cramps and spasms can affect any muscle, but they occur most often in the calf muscles of the leg. They are usually not caused by a serious problem. In many cases, the cause is not known. Some common causes include: Doing more physical work or exercise than your body is ready for. Using the muscles too much (overuse) by repeating certain movements too many times. Staying in a certain position for a long time. Playing a sport or doing an activity without preparing properly. Using bad form or technique while playing a sport or doing an activity. Not having enough water in your body (dehydration). Injury. Side effects of some medicines. Low levels of the salts and minerals in your blood (electrolytes), such as low potassium or calcium. Follow these instructions at home: Managing pain and stiffness   Massage, stretch, and relax the muscle. Do this for many minutes at a time. If told, put heat on tight or tense muscles as often as told by your doctor. Use the heat source that your doctor recommends, such as a moist heat pack or a heating pad. Place a towel between your skin and the heat source. Leave the heat on for 20-30 minutes. Remove the heat if your skin turns bright red. This is very important if you are not able to feel pain, heat, or cold. You may have a greater risk of getting burned. If told, put ice on the affected area. This may help if you are sore or have pain after a cramp or spasm. Put ice in a plastic bag. Place a towel between your skin and the bag. Leave the ice on for 20 minutes, 2-3 times a day. Try taking hot showers or baths to help relax tight muscles. Eating and  drinking Drink enough fluid to keep your pee (urine) pale yellow. Eat a healthy diet to help ensure that your muscles work well. This should include: Fruits and vegetables. Lean protein. Whole grains. Low-fat or nonfat dairy products. General instructions If you are having cramps often, avoid intense exercise for several days. Take over-the-counter and prescription medicines only as told by your doctor. Watch for any changes in your symptoms. Keep all follow-up visits as told by your doctor. This is important. Contact a doctor if: Your cramps or spasms get worse or happen more often. Your cramps or spasms do not get better with time. Summary Muscle cramps and spasms are when muscles tighten by themselves. They usually get better within minutes. Cramps and spasms occur most often in the calf muscles of the leg. Massage, stretch, and relax the muscle. This may help the cramp or spasm go away. Drink enough fluid to keep your pee (urine) pale yellow. This information is not intended to replace advice given to you by your health care provider. Make sure you discuss any questions you have with your health care provider. Document Revised: 04/20/2021 Document Reviewed: 04/20/2021 Elsevier Patient Education  [place].

## 2021-10-17 ENCOUNTER — Ambulatory Visit: Payer: PPO | Admitting: Adult Health

## 2021-10-18 ENCOUNTER — Other Ambulatory Visit: Payer: Self-pay | Admitting: Nurse Practitioner

## 2021-10-18 DIAGNOSIS — B952 Enterococcus as the cause of diseases classified elsewhere: Secondary | ICD-10-CM

## 2021-10-18 LAB — URINALYSIS, ROUTINE W REFLEX MICROSCOPIC
Bacteria, UA: NONE SEEN /HPF
Bilirubin Urine: NEGATIVE
Glucose, UA: NEGATIVE
Hyaline Cast: NONE SEEN /LPF
Ketones, ur: NEGATIVE
Nitrite: NEGATIVE
Protein, ur: NEGATIVE
RBC / HPF: NONE SEEN /HPF (ref 0–2)
Specific Gravity, Urine: 1.004 (ref 1.001–1.035)
Squamous Epithelial / HPF: NONE SEEN /HPF (ref <=5)
pH: 6 (ref 5.0–8.0)

## 2021-10-18 LAB — MICROSCOPIC MESSAGE

## 2021-10-18 LAB — URINE CULTURE
MICRO NUMBER:: 12821260
SPECIMEN QUALITY:: ADEQUATE

## 2021-10-18 MED ORDER — NITROFURANTOIN MONOHYD MACRO 100 MG PO CAPS: 100.0000 mg | ORAL_CAPSULE | Freq: Two times a day (BID) | ORAL | 0 refills | Status: AC

## 2021-10-18 NOTE — Progress Notes (Signed)
Rebekah Pace spoke to pt and gave her the results and recommendations, the pt did not have any questions for the provider or nurse

## 2021-10-23 DIAGNOSIS — F250 Schizoaffective disorder, bipolar type: Secondary | ICD-10-CM | POA: Diagnosis not present

## 2021-10-23 DIAGNOSIS — F5101 Primary insomnia: Secondary | ICD-10-CM | POA: Diagnosis not present

## 2021-11-27 ENCOUNTER — Encounter: Payer: Self-pay | Admitting: Internal Medicine

## 2021-11-27 NOTE — Patient Instructions (Signed)

## 2021-11-27 NOTE — Progress Notes (Signed)
Annual Screening/Preventative Visit & Comprehensive Evaluation &  Examination  Future Appointments  Date Time Provider Department  11/28/2021 11:00 AM Unk Pinto, MD [facility]  12/04/2022 11:00 AM Unk Pinto, MD [facility]        This very nice 66 y.o.  SWF presents for a Screening /Preventative Visit & comprehensive evaluation and management of multiple medical co-morbidities.  Patient has been followed for HTN, HLD, Prediabetes  and Vitamin D Deficiency.  [[copied:  Patient is on SS Disability for hx/o Bipolar Manic Depressive Illness w/hx/o Psychosis and is stable on only Abilify. She also has hx/ treated Hepatitis C  w/virologic cure and followed at the [facility] by Dr Zollie Scale and Roosevelt Locks, NP. She is felt to have advanced fibrotic hepatic disease. She continues f/u q16months.]]        HTN predates circa  2005.  Patient has CKD3a attributed to he HTN.  Patient's BP has been controlled at home and patient denies any cardiac symptoms as chest pain, palpitations, shortness of breath, dizziness or ankle swelling.Patient does have CKD 3a  ( GFR  55 ) . Today's BP: 118/74        Patient's hyperlipidemia is controlled with diet and medications. Patient denies myalgias or other medication SE's. Last lipids were at goal :  Lab Results  Component Value Date   CHOL 121 05/22/2021   HDL 46 (L) 05/22/2021   LDLCALC 59 05/22/2021   TRIG 79 05/22/2021   CHOLHDL 2.6 05/22/2021         Patient has hx/o Morbid Obesity  (BMI 30+) & is monitored for glucose intolerance.   Patient denies reactive hypoglycemic symptoms, visual blurring, diabetic polys or paresthesias. Last A1c was normal  & at goal :  Lab Results  Component Value Date   HGBA1C 5.0 05/22/2021                                            Patient was dx'd Hypothyroid in 2002 and initiated on replacement therapy.       Finally, patient has history of Vitamin D Deficiency and last Vitamin D was at  goal:  Lab Results  Component Value Date   VD25OH 72 05/22/2021     Current Outpatient Medications on File Prior to Visit  Medication Sig   ABILIFY 10 MG tablet Take 1 daily.   bisoprolol-hctz  10-6.25 MG tablet Take  1 tablet   Daily  for BP   cyclobenzaprine 5 MG tablet Take 1 tablet  3 times daily as needed    levothyroxine 75 MCG tablet Take  1 tablet  Daily    MAGNESIUM 500 mg Take daily   rosuvastatin 5 MG tablet TAKE 1 TABLET DAILY    traZODone  50 MG tablet 1/2-1 tablet for sleep   VITAMIN D 5,000 Units Take  2 times daily.      Allergies  Allergen Reactions   Sulfa Antibiotics Rash     Past Medical History:  Diagnosis Date   Anxiety    Depression    Gout 05/17/2016   Hepatitis    Hypothyroidism    Labile hypertension    Mental disorder    Prediabetes    Thyroid disease      Health Maintenance  Topic Date Due   Pneumonia Vaccine 1+ Years old (1 - PCV) 05/21/1961   Zoster Vaccines- Shingrix (1  of 2) Never done   COVID-19 Vaccine (4 - Booster) 07/21/2020   INFLUENZA VACCINE  05/14/2021   MAMMOGRAM  09/20/2021   TETANUS/TDAP  05/24/2022 (Originally 10/14/2005)   Fecal DNA (Cologuard)  05/24/2024 (Originally 06/24/2020)   DEXA SCAN  Completed   Hepatitis C Screening  Completed   HPV VACCINES  Aged Out     Immunization History  Administered Date(s) Administered   Influenza Inj Mdck Quad   08/02/2019   PFIZER SARS-COV-2 Vacc 05/05/2020, 05/05/2020, 05/26/2020   Pneumococcal-23 10/14/2001   Td 10/15/1995     Last Colon - 01/03/2021 - Dr Bryan Lemma - Recc f/u Colon in 5 years due Apr 2027.   Last MGM - 09/23/2020 - Overdue - Patient aware   Past Surgical History:  Procedure Laterality Date   arm fracture     COLONOSCOPY  20 + years ago    at [employer] [facility] per pt, normal exam   ROTATOR CUFF REPAIR       Family History  Problem Relation Age of Onset   Hyperlipidemia Mother    Hypertension Mother    Colon polyps Mother    Colon cancer Mother 17    Cancer Father        Lung   Hyperlipidemia Brother    Esophageal cancer Neg Hx    Rectal cancer Neg Hx    Stomach cancer Neg Hx      Social History   Tobacco Use   Smoking status: Never   Smokeless tobacco: Never  Vaping Use   Vaping Use: Never used  Substance Use Topics   Alcohol use: No   Drug use: No      ROS Constitutional: Denies fever, chills, weight loss/gain, headaches, insomnia,  night sweats, and change in appetite. Does c/o fatigue. Eyes: Denies redness, blurred vision, diplopia, discharge, itchy, watery eyes.  ENT: Denies discharge, congestion, post nasal drip, epistaxis, sore throat, earache, hearing loss, dental pain, Tinnitus, Vertigo, Sinus pain, snoring.  Cardio: Denies chest pain, palpitations, irregular heartbeat, syncope, dyspnea, diaphoresis, orthopnea, PND, claudication, edema Respiratory: denies cough, dyspnea, DOE, pleurisy, hoarseness, laryngitis, wheezing.  Gastrointestinal: Denies dysphagia, heartburn, reflux, water brash, pain, cramps, nausea, vomiting, bloating, diarrhea, constipation, hematemesis, melena, hematochezia, jaundice, hemorrhoids Genitourinary: Denies dysuria, frequency, urgency, nocturia, hesitancy, discharge, hematuria, flank pain Breast: Breast lumps, nipple discharge, bleeding.  Musculoskeletal: Denies arthralgia, myalgia, stiffness, Jt. Swelling, pain, limp, and strain/sprain. Denies falls. Skin: Denies puritis, rash, hives, warts, acne, eczema, changing in skin lesion Neuro: No weakness, tremor, incoordination, spasms, paresthesia, pain Psychiatric: Denies confusion, memory loss, sensory loss. Denies Depression. Endocrine: Denies change in weight, skin, hair change, nocturia, and paresthesia, diabetic polys, visual blurring, hyper / hypo glycemic episodes.  Heme/Lymph: No excessive bleeding, bruising, enlarged lymph nodes.  Physical Exam  BP 118/74    Pulse 70    Temp 97.8 F (36.6 C)    Resp 17    Ht 5\' 4"  (1.626 m)    Wt 180  lb (81.6 kg)    SpO2 95%    BMI 30.90 kg/m   General Appearance: Over  nourished, well groomed and in no apparent distress.  Eyes: PERRLA, EOMs, conjunctiva no swelling or erythema, normal fundi and vessels. Sinuses: No frontal/maxillary tenderness ENT/Mouth: EACs patent / TMs  nl. Nares clear without erythema, swelling, mucoid exudates. Oral hygiene is good. No erythema, swelling, or exudate. Tongue normal, non-obstructing. Tonsils not swollen or erythematous. Hearing normal.  Neck: Supple, thyroid not palpable. No bruits, nodes or JVD. Respiratory: Respiratory effort normal.  BS equal and clear bilateral without rales, rhonci, wheezing or stridor. Cardio: Heart sounds are normal with regular rate and rhythm and no murmurs, rubs or gallops. Peripheral pulses are normal and equal bilaterally without edema. No aortic or femoral bruits. Chest: symmetric with normal excursions and percussion. Breasts: Symmetric, without lumps, nipple discharge, retractions, or fibrocystic changes.  Abdomen: Flat, soft with bowel sounds active. Nontender, no guarding, rebound, hernias, masses, or organomegaly.  Lymphatics: Non tender without lymphadenopathy.  Musculoskeletal: Full ROM all peripheral extremities, joint stability, 5/5 strength, and normal gait. Skin: Warm and dry without rashes, lesions, cyanosis, clubbing or  ecchymosis.  Neuro: Cranial nerves intact, reflexes equal bilaterally. Normal muscle tone, no cerebellar symptoms. Sensation intact.  Pysch: Alert and oriented X 3, normal affect, Insight and Judgment appropriate.    Assessment and Plan  1. Annual Preventative Screening Examination   2. Essential hypertension  - Urinalysis, Routine w reflex microscopic - Microalbumin / creatinine urine ratio - CBC with Differential/Platelet - COMPLETE METABOLIC PANEL WITH GFR - TSH - Magnesium - EKG 12-Lead  3. Hyperlipidemia, mixed  - Lipid panel - TSH - EKG 12-Lead  4. Abnormal  glucose  - Hemoglobin A1c - Insulin, random - EKG 12-Lead  5. Vitamin D deficiency  - VITAMIN D 25 Hydroxy   6. Hypothyroidism  - TSH  7. Advanced hepatic fibrosis  - Protime-INR - AFP tumor marker  8. Bipolar affective disorder, depressed,  Severe, with psychotic behavior ([place])   9. Stage 3a chronic kidney disease ([place])   10. Screening for ischemic heart disease  - EKG 12-Lead  11. Screening for colorectal cancer  - POC Hemoccult Bld/Stl   12. Medication management  - Urinalysis, Routine w reflex microscopic - Protime-INR - CBC with Differential/Platelet - COMPLETE METABOLIC PANEL WITH GFR - Lipid panel - TSH - Hemoglobin A1c - Insulin, random - VITAMIN D 25 Hydroxy   13. Aortic atherosclerosis ([place])          Patient was counseled in prudent diet to achieve/maintain BMI less than 25 for weight control, BP monitoring, regular exercise and medications. Discussed med's effects and SE's. Screening labs and tests as requested with regular follow-up as recommended. Over 40 minutes of exam, counseling, chart review and high complex critical decision making was performed.   Kirtland Bouchard, MD

## 2021-11-28 ENCOUNTER — Other Ambulatory Visit: Payer: Self-pay | Admitting: Nurse Practitioner

## 2021-11-28 ENCOUNTER — Other Ambulatory Visit: Payer: Self-pay

## 2021-11-28 ENCOUNTER — Ambulatory Visit (INDEPENDENT_AMBULATORY_CARE_PROVIDER_SITE_OTHER): Payer: PPO | Admitting: Internal Medicine

## 2021-11-28 ENCOUNTER — Encounter: Payer: Self-pay | Admitting: Internal Medicine

## 2021-11-28 VITALS — BP 118/74 | HR 70 | Temp 97.8°F | Resp 17 | Ht 64.0 in | Wt 180.0 lb

## 2021-11-28 DIAGNOSIS — I10 Essential (primary) hypertension: Secondary | ICD-10-CM

## 2021-11-28 DIAGNOSIS — N1831 Chronic kidney disease, stage 3a: Secondary | ICD-10-CM

## 2021-11-28 DIAGNOSIS — R7309 Other abnormal glucose: Secondary | ICD-10-CM | POA: Diagnosis not present

## 2021-11-28 DIAGNOSIS — E782 Mixed hyperlipidemia: Secondary | ICD-10-CM | POA: Diagnosis not present

## 2021-11-28 DIAGNOSIS — E559 Vitamin D deficiency, unspecified: Secondary | ICD-10-CM | POA: Diagnosis not present

## 2021-11-28 DIAGNOSIS — E039 Hypothyroidism, unspecified: Secondary | ICD-10-CM

## 2021-11-28 DIAGNOSIS — Z0001 Encounter for general adult medical examination with abnormal findings: Secondary | ICD-10-CM

## 2021-11-28 DIAGNOSIS — Z0000 Encounter for general adult medical examination without abnormal findings: Secondary | ICD-10-CM | POA: Diagnosis not present

## 2021-11-28 DIAGNOSIS — K7402 Hepatic fibrosis, advanced fibrosis: Secondary | ICD-10-CM

## 2021-11-28 DIAGNOSIS — Z1211 Encounter for screening for malignant neoplasm of colon: Secondary | ICD-10-CM

## 2021-11-28 DIAGNOSIS — Z79899 Other long term (current) drug therapy: Secondary | ICD-10-CM

## 2021-11-28 DIAGNOSIS — Z136 Encounter for screening for cardiovascular disorders: Secondary | ICD-10-CM | POA: Diagnosis not present

## 2021-11-28 DIAGNOSIS — I700 Atherosclerosis of aorta: Secondary | ICD-10-CM

## 2021-11-28 DIAGNOSIS — F315 Bipolar disorder, current episode depressed, severe, with psychotic features: Secondary | ICD-10-CM

## 2021-11-29 LAB — LIPID PANEL
Cholesterol: 114 mg/dL (ref <200)
HDL: 47 mg/dL — ABNORMAL LOW (ref >=50)
LDL Cholesterol (Calc): 52 mg/dL (calc)
Non-HDL Cholesterol (Calc): 67 mg/dL (calc) (ref <130)
Total CHOL/HDL Ratio: 2.4 (calc) (ref <5.0)
Triglycerides: 66 mg/dL (ref <150)

## 2021-11-29 LAB — COMPLETE METABOLIC PANEL WITH GFR
AG Ratio: 1.8 (calc) (ref 1.0–2.5)
ALT: 17 U/L (ref 6–29)
AST: 17 U/L (ref 10–35)
Albumin: 4.4 g/dL (ref 3.6–5.1)
Alkaline phosphatase (APISO): 77 U/L (ref 37–153)
BUN/Creatinine Ratio: 16 (calc) (ref 6–22)
BUN: 18 mg/dL (ref 7–25)
CO2: 23 mmol/L (ref 20–32)
Calcium: 9.4 mg/dL (ref 8.6–10.4)
Chloride: 107 mmol/L (ref 98–110)
Creat: 1.14 mg/dL — ABNORMAL HIGH (ref 0.50–1.05)
Globulin: 2.5 g/dL (calc) (ref 1.9–3.7)
Glucose, Bld: 96 mg/dL (ref 65–99)
Potassium: 4.5 mmol/L (ref 3.5–5.3)
Sodium: 139 mmol/L (ref 135–146)
Total Bilirubin: 0.5 mg/dL (ref 0.2–1.2)
Total Protein: 6.9 g/dL (ref 6.1–8.1)
eGFR: 53 mL/min/{1.73_m2} — ABNORMAL LOW (ref >=60)

## 2021-11-29 LAB — MICROALBUMIN / CREATININE URINE RATIO
Creatinine, Urine: 51 mg/dL (ref 20–275)
Microalb, Ur: <0.2 mg/dL

## 2021-11-29 LAB — URINALYSIS, ROUTINE W REFLEX MICROSCOPIC
Bacteria, UA: NONE SEEN /HPF
Bilirubin Urine: NEGATIVE
Glucose, UA: NEGATIVE
Hyaline Cast: NONE SEEN /LPF
Ketones, ur: NEGATIVE
Nitrite: NEGATIVE
Protein, ur: NEGATIVE
RBC / HPF: NONE SEEN /HPF (ref 0–2)
Specific Gravity, Urine: 1.009 (ref 1.001–1.035)
pH: 5.5 (ref 5.0–8.0)

## 2021-11-29 LAB — HEMOGLOBIN A1C
Hgb A1c MFr Bld: 5 % of total Hgb (ref <5.7)
Mean Plasma Glucose: 97 mg/dL
eAG (mmol/L): 5.4 mmol/L

## 2021-11-29 LAB — CBC WITH DIFFERENTIAL/PLATELET
Absolute Monocytes: 440 cells/uL (ref 200–950)
Basophils Absolute: 60 cells/uL (ref 0–200)
Basophils Relative: 1.2 %
Eosinophils Absolute: 190 cells/uL (ref 15–500)
Eosinophils Relative: 3.8 %
HCT: 40.4 % (ref 35.0–45.0)
Hemoglobin: 13.3 g/dL (ref 11.7–15.5)
Lymphs Abs: 1305 cells/uL (ref 850–3900)
MCH: 29.4 pg (ref 27.0–33.0)
MCHC: 32.9 g/dL (ref 32.0–36.0)
MCV: 89.2 fL (ref 80.0–100.0)
MPV: 10.3 fL (ref 7.5–12.5)
Monocytes Relative: 8.8 %
Neutro Abs: 3005 cells/uL (ref 1500–7800)
Neutrophils Relative %: 60.1 %
Platelets: 193 10*3/uL (ref 140–400)
RBC: 4.53 10*6/uL (ref 3.80–5.10)
RDW: 12.3 % (ref 11.0–15.0)
Total Lymphocyte: 26.1 %
WBC: 5 10*3/uL (ref 3.8–10.8)

## 2021-11-29 LAB — PROTIME-INR
INR: 1
Prothrombin Time: 10.5 s (ref 9.0–11.5)

## 2021-11-29 LAB — VITAMIN D 25 HYDROXY (VIT D DEFICIENCY, FRACTURES): Vit D, 25-Hydroxy: 71 ng/mL (ref 30–100)

## 2021-11-29 LAB — MAGNESIUM: Magnesium: 2 mg/dL (ref 1.5–2.5)

## 2021-11-29 LAB — TSH: TSH: 2.27 mIU/L (ref 0.40–4.50)

## 2021-11-29 LAB — INSULIN, RANDOM: Insulin: 22.4 u[IU]/mL — ABNORMAL HIGH

## 2021-11-29 LAB — MICROSCOPIC MESSAGE

## 2021-11-29 LAB — AFP TUMOR MARKER: AFP-Tumor Marker: 9.1 ng/mL — ABNORMAL HIGH

## 2021-11-30 NOTE — Progress Notes (Signed)
=============================================================== °-   Test results slightly outside the reference range are not unusual. If there is anything important, I will review this with you,  otherwise it is considered normal test values.  If you have further questions,  please do not hesitate to contact me at the office or via My Chart.  =============================================================== ===============================================================  -  Liver enzymes are Normal  - Great  =============================================================== ===============================================================  - Protime  - clotting test is normal shows liver normal                                                                                 making blood clotting factors  =============================================================== ===============================================================  - AFP liver tumor marker  followed by [facility] is still slightly elevated & stable    ~ about the same over the last 3 years  - actually slightly lower =============================================================== ===============================================================  -  Cholesterol = 114   &  LDL Chol = 52   -  both    Excellent   - Very low risk for Heart Attack  / Stroke ============================================================ ============================================================  -  A1c - Normal - No Diabetes  - Great !  =============================================================== ===============================================================  - Vitamin D = 71 - Excellent - Please keep dose same  =============================================================== ===============================================================  - All Else - CBC - Kidneys - Electrolytes - Liver - Magnesium & Thyroid    - all  Normal /  OK =============================================================== ===============================================================

## 2021-12-07 ENCOUNTER — Ambulatory Visit
Admission: RE | Admit: 2021-12-07 | Discharge: 2021-12-07 | Disposition: A | Discharge status: Home / Self Care | Payer: PPO | Source: Ambulatory Visit | Attending: Nurse Practitioner | Admitting: Nurse Practitioner

## 2021-12-07 DIAGNOSIS — K7400 Hepatic fibrosis, unspecified: Secondary | ICD-10-CM | POA: Diagnosis not present

## 2021-12-07 DIAGNOSIS — K7402 Hepatic fibrosis, advanced fibrosis: Secondary | ICD-10-CM

## 2021-12-17 ENCOUNTER — Other Ambulatory Visit: Payer: Self-pay

## 2021-12-17 DIAGNOSIS — Z1212 Encounter for screening for malignant neoplasm of rectum: Secondary | ICD-10-CM | POA: Diagnosis not present

## 2021-12-17 DIAGNOSIS — Z1211 Encounter for screening for malignant neoplasm of colon: Secondary | ICD-10-CM | POA: Diagnosis not present

## 2021-12-17 LAB — POC HEMOCCULT BLD/STL (HOME/3-CARD/SCREEN)
Card #2 Fecal Occult Blod, POC: NEGATIVE
Card #3 Fecal Occult Blood, POC: NEGATIVE
Fecal Occult Blood, POC: NEGATIVE

## 2021-12-26 ENCOUNTER — Other Ambulatory Visit: Payer: Self-pay | Admitting: Internal Medicine

## 2021-12-26 DIAGNOSIS — E782 Mixed hyperlipidemia: Secondary | ICD-10-CM

## 2021-12-26 MED ORDER — ROSUVASTATIN CALCIUM 5 MG PO TABS: ORAL_TABLET | ORAL | 3 refills | Status: DC

## 2022-01-07 DIAGNOSIS — K760 Fatty (change of) liver, not elsewhere classified: Secondary | ICD-10-CM | POA: Diagnosis not present

## 2022-01-07 DIAGNOSIS — K7402 Hepatic fibrosis, advanced fibrosis: Secondary | ICD-10-CM | POA: Diagnosis not present

## 2022-02-23 ENCOUNTER — Other Ambulatory Visit: Payer: Self-pay | Admitting: Internal Medicine

## 2022-02-23 DIAGNOSIS — E039 Hypothyroidism, unspecified: Secondary | ICD-10-CM

## 2022-02-23 MED ORDER — LEVOTHYROXINE SODIUM 75 MCG PO TABS: ORAL_TABLET | ORAL | 3 refills | Status: DC

## 2022-03-05 NOTE — Progress Notes (Unsigned)
Patient ID: Rebekah Pace, female   DOB: August 28, 1956, 66 y.o.   MRN: [id number]  Medicare  Annual  Wellness Visit And follow up  Assessment:   Encounter for Medicare annual wellness exam 1 year Encouraged patient to schedule covid 19 vaccine Given number to schedule mammogram UTD on colonoscopy  Labile hypertension - continue medications, DASH diet, exercise and monitor at home. Call if greater than 130/80.  - Use sunscreen liberally when anticipating prolonged sun exposure with Ziac -     CBC with Differential/Platelet -     CMP/GFR -     TSH  Chronic hepatitis C without hepatic coma (HCC) In remission after epclusa -     CMP/GFR  CKD stage 3a Push fluids and limit NSaid's Monitor blood sugars  Hepatic fibrosis Will send labs to GI -send to [facility] liver care fax [phone] Avoid fatty foods -     CMP/GFR  Schizoaffective disorder, bipolar type ([place]) Continue follow up psych  Recurrent major depressive disorder, in full remission ([place]) Continue follow up psych  Medication management Continued  Mixed hyperlipidemia -continue medications, check lipids, decrease fatty foods, increase activity.  -     Lipid panel  Bipolar affective disorder, depressed, severe, with psychotic behavior ([place]) Continue follow up psych  Vitamin D deficiency At goal at recent check; continue to recommend supplementation for goal of 60-100 Defer vitamin D level  Hypothyroidism, unspecified type Hypothyroidism-check TSH level, continue medications the same, reminded to take on an empty stomach 30-77mns before food.   Obesity - BMI 31 - long discussion about weight loss, diet, and exercise -recommended diet heavy in fruits and veggies and low in animal meats, cheeses, and dairy products - discussed weight loss goal of <170 lb  Abnormal glucose Recent a1cs well controlled; check CMP  Insomnia Insomnia- good sleep hygiene discussed, increase day time activity Continue  Trazodone  Pneumococcal vaccine need -Prevnar 20 given today     Continue diet and meds as discussed. Further disposition pending results of labs. Discussed med's effects and SE's.   Over 30 minutes of exam, counseling, chart review, and critical decision making was performed.   Future Appointments  Date Time Provider [place] 06/11/2022 10:30 AM MUnk Pinto MD [facility] None  12/04/2022 11:00 AM MUnk Pinto MD [facility] None    Plan:   During the course of the visit the patient was educated and counseled about appropriate screening and preventive services including:   Pneumococcal vaccine  Influenza vaccine Td vaccine Screening electrocardiogram Bone densitometry screening Colorectal cancer screening Diabetes screening Glaucoma screening Nutrition counseling  Advanced directives: requested   Subjective:   Rebekah Pace 66y.o. DWF,  presents for Medicare Annual Wellness Visit and follow up  for follow up with Hypertension, Hyperlipidemia, Pre-Diabetes, Hypothyroidism  and Vitamin D Deficiency.   She has been following with [facility]. [facility] for hepatitis C, treated with epclusa and in remission since. She has NASH/hepatic fibrosis and is following with them.   She was at the beach and was in the sun and has a reaction on legs and feet from taking Ziac and excessive sun exposure. The rash has now resolved.   In the spring of 2013 she had a psychotic break, has had several psych admissions, Dx'd Bipolar Manic Depressive. Currently [facility] is [facility] by a staff therapist & Psychiatrist to manage her psychotropic medications. She is on Abilify and reports she  typically well with current regimen. She is taking trazodone 50 mg at night for sleep.   Patient has hx/o labile HTN circa 2005 and BP has been controlled and today's BP: 128/70 BP Readings from Last 3 Encounters:  03/06/22 128/70   11/28/21 118/74  10/16/21 130/70  She is walking daily, 40 mins daily and denies any cardiac type chest pain, palpitations, dyspnea/orthopnea/PND, dizziness, claudication, or dependent edema.    BMI is Body mass index is 31.86 kg/m., she has been working on diet and exercise, was formerly walking but has stopped while caring for mother, hasn't restarted.  She denies alcohol intake, mainly drinks water, 5  Wt Readings from Last 3 Encounters:  03/06/22 185 lb 9.6 oz (84.2 kg)  11/28/21 180 lb (81.6 kg)  10/16/21 178 lb 6.4 oz (80.9 kg)    She has started the crestor (5 mg daily), she is working hard on diet/exercies. Patient denies myalgias or other med SE's. Last LDL was at goal with:  Lab Results  Component Value Date   CHOL 114 11/28/2021   HDL 47 (L) 11/28/2021   LDLCALC 52 11/28/2021   TRIG 66 11/28/2021   CHOLHDL 2.4 11/28/2021   Also, the patient has history of Insulin Resistance with A1c 5.5% and elevated Insulin level 35 in Jan 2014 and has had no symptoms of reactive hypoglycemia, diabetic polys, paresthesias or visual blurring.  Recent A1C was in normal range:  Lab Results  Component Value Date   HGBA1C 5.0 11/28/2021    She has CKD III monitored at this office:  Lab Results  Component Value Date   GFRNONAA 55 (L) 11/17/2020   Patient has been on thyroid replacement circa 2005 and remained in therapeutic range on routine monitoring. Takes 75 mcg daily without recent dose change.  Lab Results  Component Value Date   TSH 2.27 11/28/2021   Further, the patient also has history of Vitamin D Deficiency and is on vitamin D supplement, taking 10000 IU daily  Lab Results  Component Value Date   VD25OH 89 11/28/2021     Medication Review: Current Outpatient Medications on File Prior to Visit  Medication Sig   ARIPiprazole (ABILIFY) 10 MG tablet Take 10 mg by mouth daily.   bisoprolol-hydrochlorothiazide (ZIAC) 10-6.25 MG tablet Take  1 tablet   Daily  for BP    levothyroxine (SYNTHROID) 75 MCG tablet Take  1 tablet  Daily  on an empty stomach with only water for 30 minutes & no Antacid meds, Calcium or Magnesium for 4 hours & avoid Biotin                                                   /                                   TAKE                                      BY                     MOUTH   MAGNESIUM PO Take 500 mg by mouth.    rosuvastatin (CRESTOR) 5 MG tablet  TAKE 1 TABLET BY MOUTH DAILY FOR CHOLESTEROL   traZODone (DESYREL) 50 MG tablet 1/2-1 tablet for sleep   VITAMIN D, CHOLECALCIFEROL, PO Take 5,000 Units by mouth 2 (two) times daily.    No current facility-administered medications on file prior to visit.    Current Problems (verified) Patient Active Problem List   Diagnosis Date Noted   Chronic cystitis 05/26/2021   Aortic atherosclerosis ([place]) per CT 06/2020 02/16/2021   Stage 3a chronic kidney disease ([place]) 03/02/2020   Abnormal glucose 11/14/2019   Hematuria, microscopic 07/31/2018   Hepatic fibrosis 11/23/2016   Gout 05/17/2016   History of hepatitis C, s/p curative treatment 01/31/2016   Obesity (BMI 30.0-34.9) 07/24/2015   Hypothyroidism 12/13/2014   Hyperlipidemia, mixed 09/01/2014   Vitamin D deficiency 09/01/2014   Bipolar affective disorder, depressed, severe, with psychotic behavior ([place]) 09/01/2014   Medication management 03/30/2014   Labile hypertension    Schizoaffective disorder, bipolar type ([place]) 09/12/2011    Health Maintenance  Topic Date Due   Pneumonia Vaccine (1 - PCV) 05/21/1961   COVID-19 Vaccine (4 - Booster) 03/22/2022*   Tetanus Vaccine  05/24/2022*   Zoster (Shingles) Vaccine (1 of 2) 06/06/2022*   Mammogram  03/07/2023*   Cologuard (Stool DNA test)  05/24/2024*   Flu Shot  05/14/2022   DEXA scan (bone density measurement)  Completed   Hepatitis C Screening: USPSTF Recommendation to screen - Ages 46-79 yo.  Completed   HPV Vaccine  Aged Out  *Topic was postponed. The date shown is not the  original due date.   Immunization History  Administered Date(s) Administered   Influenza Inj Mdck Quad With Preservative 08/02/2019   PFIZER(Purple Top)SARS-COV-2 Vaccination 05/05/2020, 05/05/2020, 05/26/2020   Pneumococcal-Unspecified 10/14/2001   Td 10/15/1995    Preventative care: Last colonoscopy: 01/03/21 by Dr Bryan Lemma due 2027 Cologuard 06/2017 negative, due 06/2020, but with mother having colon cancer will resume colonoscopy -  MGM 09/20/20 Negative [place] Ab 09/2015 CXR 2013 PAP 06/15/20 negative repeat 3 years then should be done Ct renal stone 01/2017  Influenza: 07/2019 Pneumococcal: 2003 Tetanus: 1997, patient will boost PRN due to cost Covid 19: considering, discussed at length, may get at [place] [facility] Other Physician/Practitioners you currently use: 1. [facility] here for primary care 2. Dr Delman Cheadle, OD, eye doctor, appt tomorrow 3. Dr. Doreene Burke, dentist, last visit 10/2021  Patient Care Team: Unk Pinto, MD as PCP - General (Internal Medicine) Garrel Ridgel, DPM as Consulting Physician (Podiatry) Inda Castle, MD (Inactive) as Consulting Physician (Gastroenterology) Thalia Bloodgood, [place] as Referring Physician (Optometry) Roosevelt Locks, CRNP as Nurse Practitioner (Nurse Practitioner) Zamor, Ander Gaster, MD as Consulting Physician (Gastroenterology) Roosevelt Locks, CRNP as Nurse Practitioner (Gastroenterology)  Allergies Allergies  Allergen Reactions   Sulfa Antibiotics Rash    SURGICAL HISTORY She  has a past surgical history that includes arm fracture; Rotator cuff repair; and Colonoscopy (20 + years ago ). FAMILY HISTORY Her family history includes Cancer in her father; Colon cancer (age of onset: 68) in her mother; Colon polyps in her mother; Hyperlipidemia in her brother and mother; Hypertension in her mother. SOCIAL HISTORY She  reports that she has never smoked. She has never used smokeless tobacco.  She reports that she does not drink alcohol and does not use drugs.  MEDICARE WELLNESS OBJECTIVES: Physical activity: Current Exercise Habits: Home exercise routine, Type of exercise: walking, Time (Minutes): 30, Frequency (Times/Week): 4, Weekly Exercise (Minutes/Week): 120, Intensity: Mild, Exercise  limited by: Other - see comments Cardiac risk factors: Cardiac Risk Factors include: advanced age (>59mn, >>41women);dyslipidemia;hypertension;obesity (BMI >30kg/m2);sedentary lifestyle Depression/mood screen:      03/06/2022   11:38 AM  Depression screen PHQ 2/9  Decreased Interest 0  Down, Depressed, Hopeless 0  PHQ - 2 Score 0    ADLs:     03/06/2022   11:39 AM 11/27/2021   10:25 PM  In your present state of health, do you have any difficulty performing the following activities:  Hearing? 0 0  Vision? 0 0  Difficulty concentrating or making decisions? 0 0  Walking or climbing stairs? 0 0  Dressing or bathing? 0 0  Doing errands, shopping? 0 0    Cognitive Testing  Alert? Yes  Normal Appearance?Yes  Oriented to person? Yes  Place? Yes   Time? Yes  Recall of three objects?  Yes  Can perform simple calculations? Yes  Displays appropriate judgment?Yes  Can read the correct time from a watch face?Yes  EOL planning: Does Patient Have a Medical Advance Directive?: No Would patient like information on creating a medical advance directive?: No - Patient declined   Objective:     BP 128/70   Pulse 64   Temp (!) 97.3 F (36.3 C)   Wt 185 lb 9.6 oz (84.2 kg)   SpO2 97%   BMI 31.86 kg/m   General Appearance: Well nourished, alert, WD/WN, female and in no apparent distress. Eyes: PERRLA, EOMs, conjunctiva no swelling or erythema, normal fundi and vessels. Sinuses: No frontal/maxillary tenderness ENT/Mouth: EACs patent / TMs  nl. Nares clear without erythema, swelling, mucoid exudates. Oral hygiene is good. No erythema, swelling, or exudate. Tongue normal, non-obstructing.  Tonsils not swollen or erythematous. Hearing normal.  Neck: Supple, thyroid normal. No bruits, nodes or JVD. Respiratory: Respiratory effort normal.  BS equal and clear bilateral without rales, rhonci, wheezing or stridor. Cardio: Heart sounds are normal with regular rate and rhythm and no murmurs, rubs or gallops. Peripheral pulses are normal and equal bilaterally without edema. No aortic or femoral bruits. Chest: symmetric with normal excursions and percussion. Abdomen: Flat, soft  with nl bowel sounds. Diffuse tenderness, no guarding, rebound, hernias, masses, or organomegaly.  No CVA tendernss Lymphatics: Non tender without lymphadenopathy.  Musculoskeletal: Full ROM all peripheral extremities, joint stability, 5/5 strength, and normal gait. Skin: Warm and dry without rashes, lesions, cyanosis, clubbing or  ecchymosis.  Neuro: Cranial nerves intact, reflexes equal bilaterally. Normal muscle tone, no cerebellar symptoms. Sensation intact.  Pysch: Alert and oriented X 3, normal affect, Insight and Judgment appropriate.   Medicare Attestation I have personally reviewed: The patient's medical and social history Their use of alcohol, tobacco or illicit drugs Their current medications and supplements The patient's functional ability including ADLs,fall risks, home safety risks, cognitive, and hearing and visual impairment Diet and physical activities Evidence for depression or mood disorders  The patient's weight, height, BMI, and visual acuity have been recorded in the chart.  I have made referrals, counseling, and provided education to the patient based on review of the above and I have provided the patient with a written personalized care plan for preventive services.  Over 40 minutes of exam, counseling, chart review was performed.  DMagda Bernheim NP   03/06/2022

## 2022-03-06 ENCOUNTER — Encounter: Payer: Self-pay | Admitting: Nurse Practitioner

## 2022-03-06 ENCOUNTER — Ambulatory Visit (INDEPENDENT_AMBULATORY_CARE_PROVIDER_SITE_OTHER): Payer: PPO | Admitting: Nurse Practitioner

## 2022-03-06 VITALS — BP 128/70 | HR 64 | Temp 97.3°F | Wt 185.6 lb

## 2022-03-06 DIAGNOSIS — N1831 Chronic kidney disease, stage 3a: Secondary | ICD-10-CM | POA: Diagnosis not present

## 2022-03-06 DIAGNOSIS — F315 Bipolar disorder, current episode depressed, severe, with psychotic features: Secondary | ICD-10-CM

## 2022-03-06 DIAGNOSIS — R6889 Other general symptoms and signs: Secondary | ICD-10-CM

## 2022-03-06 DIAGNOSIS — F3340 Major depressive disorder, recurrent, in remission, unspecified: Secondary | ICD-10-CM

## 2022-03-06 DIAGNOSIS — Z23 Encounter for immunization: Secondary | ICD-10-CM | POA: Diagnosis not present

## 2022-03-06 DIAGNOSIS — E039 Hypothyroidism, unspecified: Secondary | ICD-10-CM

## 2022-03-06 DIAGNOSIS — F250 Schizoaffective disorder, bipolar type: Secondary | ICD-10-CM | POA: Diagnosis not present

## 2022-03-06 DIAGNOSIS — E782 Mixed hyperlipidemia: Secondary | ICD-10-CM | POA: Diagnosis not present

## 2022-03-06 DIAGNOSIS — K7400 Hepatic fibrosis, unspecified: Secondary | ICD-10-CM | POA: Diagnosis not present

## 2022-03-06 DIAGNOSIS — Z0001 Encounter for general adult medical examination with abnormal findings: Secondary | ICD-10-CM

## 2022-03-06 DIAGNOSIS — E559 Vitamin D deficiency, unspecified: Secondary | ICD-10-CM

## 2022-03-06 DIAGNOSIS — R7309 Other abnormal glucose: Secondary | ICD-10-CM | POA: Diagnosis not present

## 2022-03-06 DIAGNOSIS — B182 Chronic viral hepatitis C: Secondary | ICD-10-CM

## 2022-03-06 DIAGNOSIS — R0989 Other specified symptoms and signs involving the circulatory and respiratory systems: Secondary | ICD-10-CM | POA: Diagnosis not present

## 2022-03-06 DIAGNOSIS — E669 Obesity, unspecified: Secondary | ICD-10-CM | POA: Diagnosis not present

## 2022-03-06 DIAGNOSIS — F5101 Primary insomnia: Secondary | ICD-10-CM

## 2022-03-06 DIAGNOSIS — Z79899 Other long term (current) drug therapy: Secondary | ICD-10-CM | POA: Diagnosis not present

## 2022-03-07 DIAGNOSIS — H2513 Age-related nuclear cataract, bilateral: Secondary | ICD-10-CM | POA: Diagnosis not present

## 2022-03-07 DIAGNOSIS — H52203 Unspecified astigmatism, bilateral: Secondary | ICD-10-CM | POA: Diagnosis not present

## 2022-03-07 LAB — CBC WITH DIFFERENTIAL/PLATELET
Absolute Monocytes: 378 cells/uL (ref 200–950)
Basophils Absolute: 49 cells/uL (ref 0–200)
Basophils Relative: 0.9 %
Eosinophils Absolute: 140 cells/uL (ref 15–500)
Eosinophils Relative: 2.6 %
HCT: 41.4 % (ref 35.0–45.0)
Hemoglobin: 13.6 g/dL (ref 11.7–15.5)
Lymphs Abs: 1075 cells/uL (ref 850–3900)
MCH: 29.6 pg (ref 27.0–33.0)
MCHC: 32.9 g/dL (ref 32.0–36.0)
MCV: 90 fL (ref 80.0–100.0)
MPV: 10.5 fL (ref 7.5–12.5)
Monocytes Relative: 7 %
Neutro Abs: 3758 cells/uL (ref 1500–7800)
Neutrophils Relative %: 69.6 %
Platelets: 204 10*3/uL (ref 140–400)
RBC: 4.6 10*6/uL (ref 3.80–5.10)
RDW: 12.2 % (ref 11.0–15.0)
Total Lymphocyte: 19.9 %
WBC: 5.4 10*3/uL (ref 3.8–10.8)

## 2022-03-07 LAB — COMPLETE METABOLIC PANEL WITH GFR
AG Ratio: 1.9 (calc) (ref 1.0–2.5)
ALT: 14 U/L (ref 6–29)
AST: 18 U/L (ref 10–35)
Albumin: 4.7 g/dL (ref 3.6–5.1)
Alkaline phosphatase (APISO): 64 U/L (ref 37–153)
BUN/Creatinine Ratio: 11 (calc) (ref 6–22)
BUN: 12 mg/dL (ref 7–25)
CO2: 23 mmol/L (ref 20–32)
Calcium: 9.9 mg/dL (ref 8.6–10.4)
Chloride: 105 mmol/L (ref 98–110)
Creat: 1.12 mg/dL — ABNORMAL HIGH (ref 0.50–1.05)
Globulin: 2.5 g/dL (calc) (ref 1.9–3.7)
Glucose, Bld: 98 mg/dL (ref 65–99)
Potassium: 4.4 mmol/L (ref 3.5–5.3)
Sodium: 138 mmol/L (ref 135–146)
Total Bilirubin: 0.8 mg/dL (ref 0.2–1.2)
Total Protein: 7.2 g/dL (ref 6.1–8.1)
eGFR: 54 mL/min/{1.73_m2} — ABNORMAL LOW (ref >=60)

## 2022-03-07 LAB — LIPID PANEL
Cholesterol: 128 mg/dL (ref <200)
HDL: 47 mg/dL — ABNORMAL LOW (ref >=50)
LDL Cholesterol (Calc): 62 mg/dL (calc)
Non-HDL Cholesterol (Calc): 81 mg/dL (calc) (ref <130)
Total CHOL/HDL Ratio: 2.7 (calc) (ref <5.0)
Triglycerides: 105 mg/dL (ref <150)

## 2022-03-07 LAB — TSH: TSH: 2.11 mIU/L (ref 0.40–4.50)

## 2022-04-22 DIAGNOSIS — F5101 Primary insomnia: Secondary | ICD-10-CM | POA: Diagnosis not present

## 2022-04-22 DIAGNOSIS — F250 Schizoaffective disorder, bipolar type: Secondary | ICD-10-CM | POA: Diagnosis not present

## 2022-04-22 DIAGNOSIS — R251 Tremor, unspecified: Secondary | ICD-10-CM | POA: Diagnosis not present

## 2022-06-06 IMAGING — US US ABDOMEN LIMITED
1 series · 14 of 25 positions shown · non-contrast
Comparison: 06/25/2021

CLINICAL DATA: Hepatic fibrosis

EXAM:
ULTRASOUND ABDOMEN LIMITED RIGHT UPPER QUADRANT

[Series 1: us abdomen limited · 0.23mm/px · 14 of 40 slices shown]
[im 1/40]
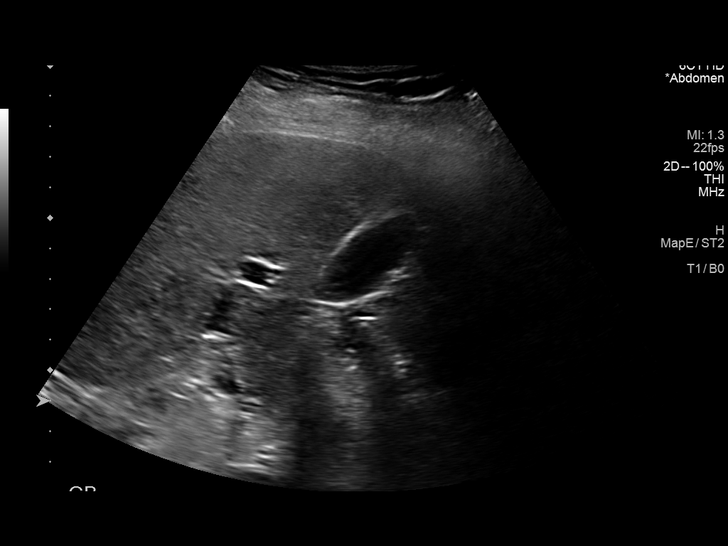
[im 4/40]
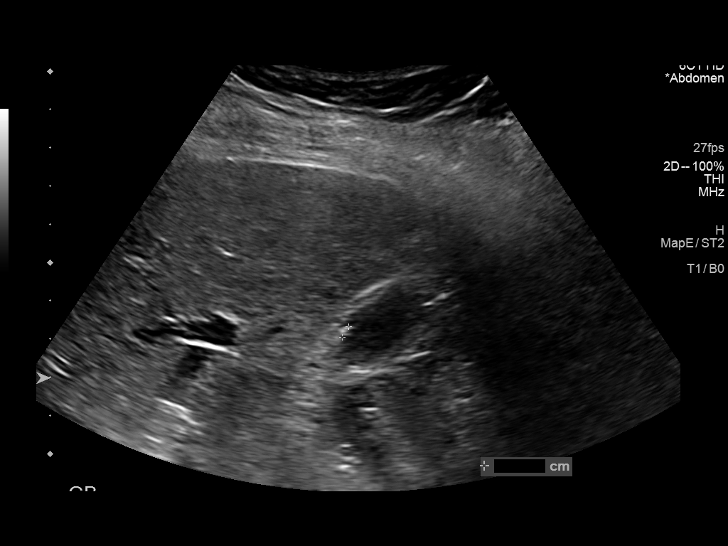
[im 7/40]
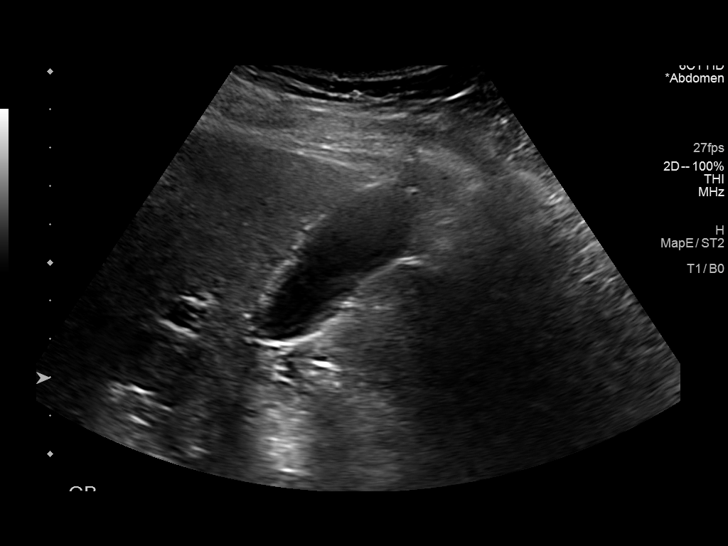
[im 10/40]
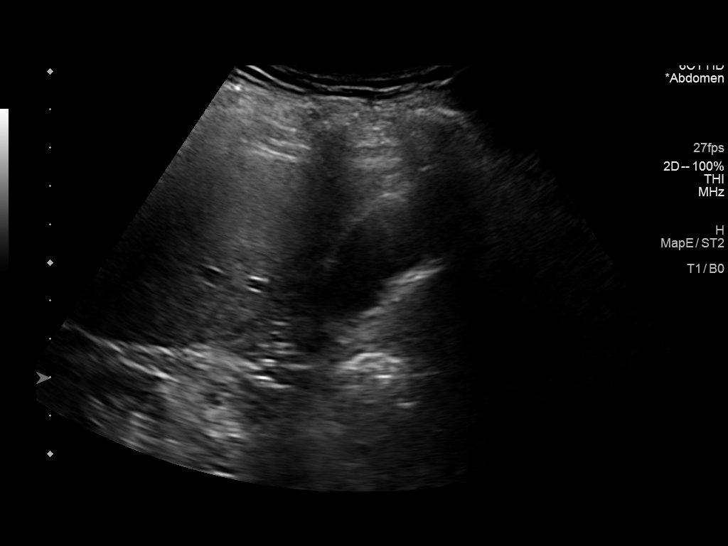
[im 14/40]
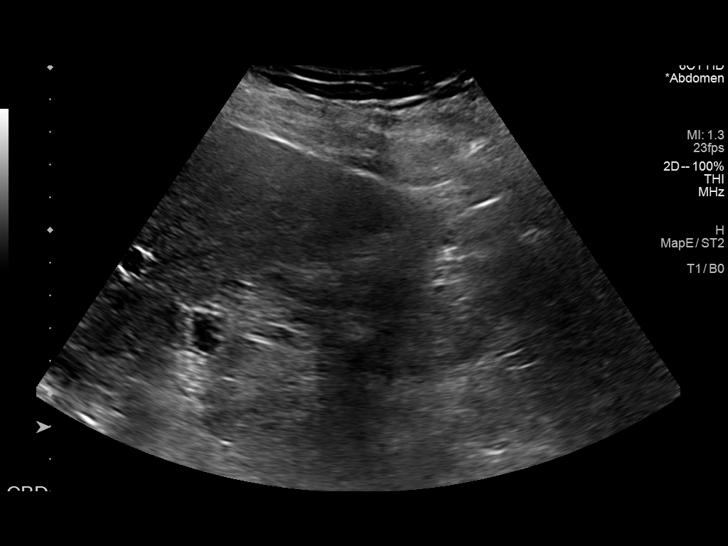
[im 15/40]
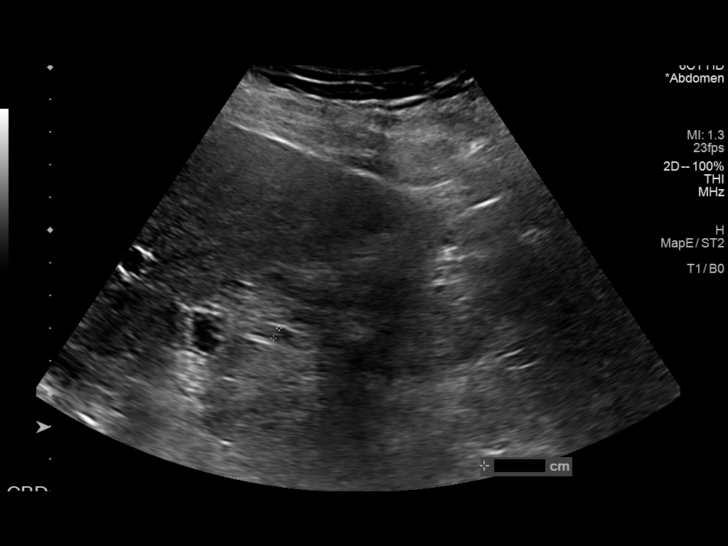
[im 18/40]
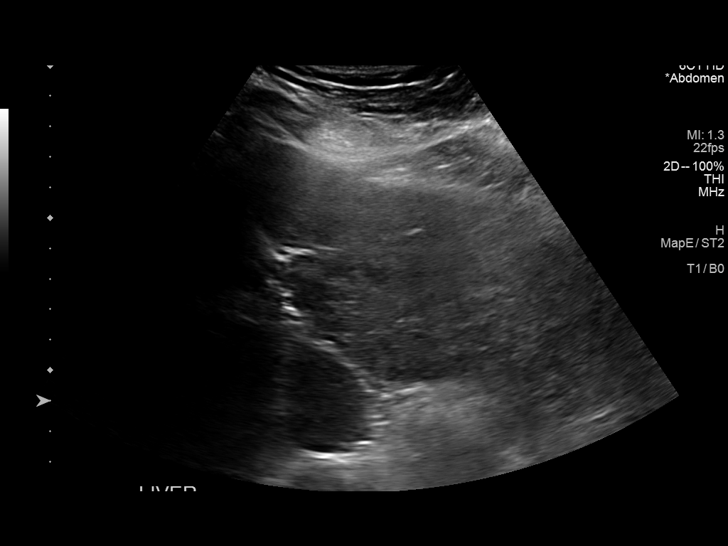
[im 22/40]
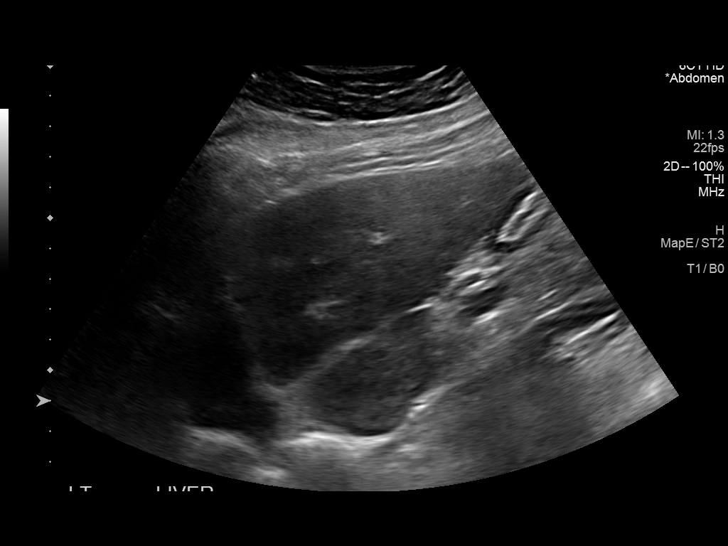
[im 25/40]
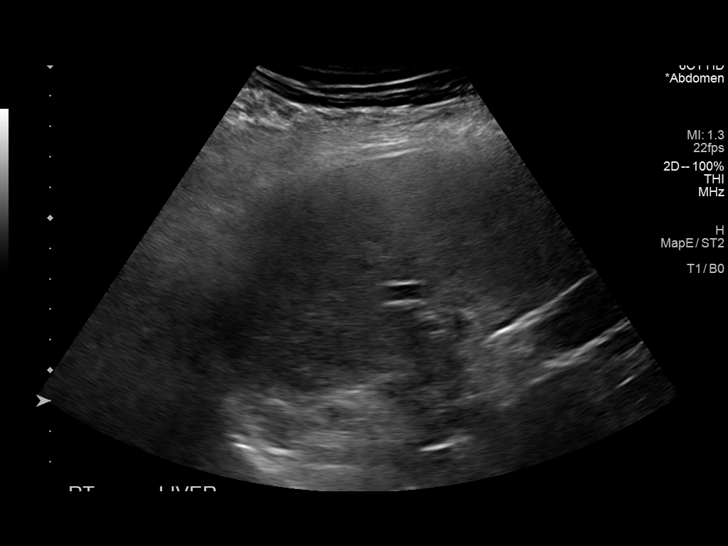
[im 27/40]
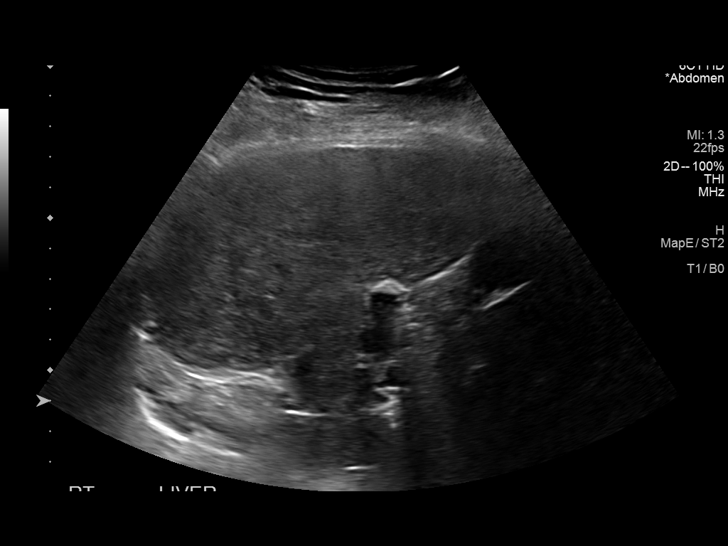
[im 30/40]
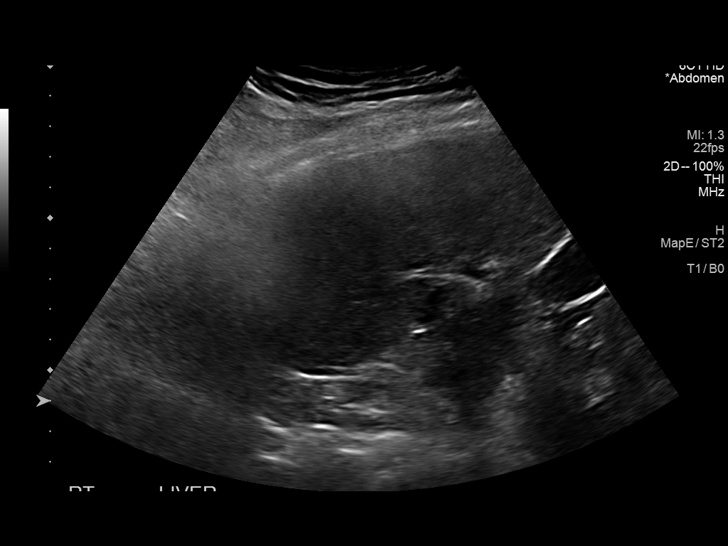
[im 33/40]
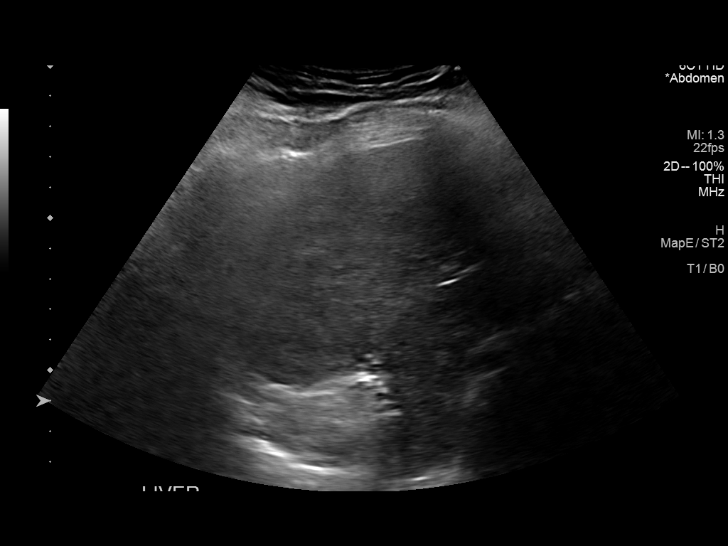
[im 36/40]
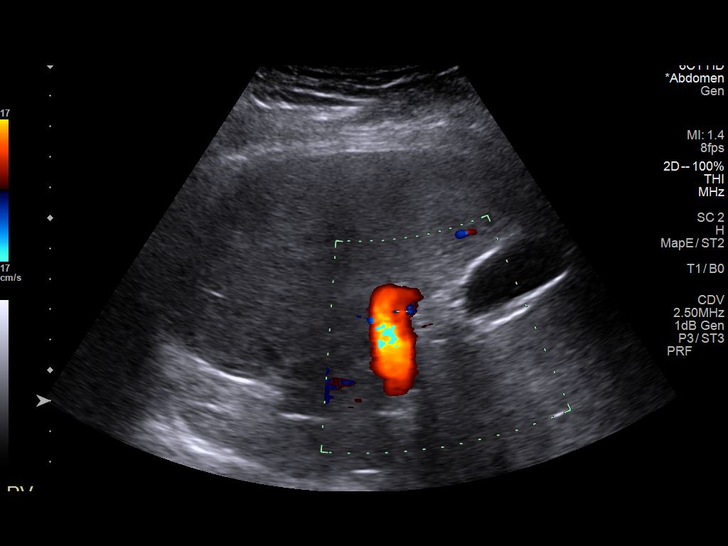
[im 40/40]
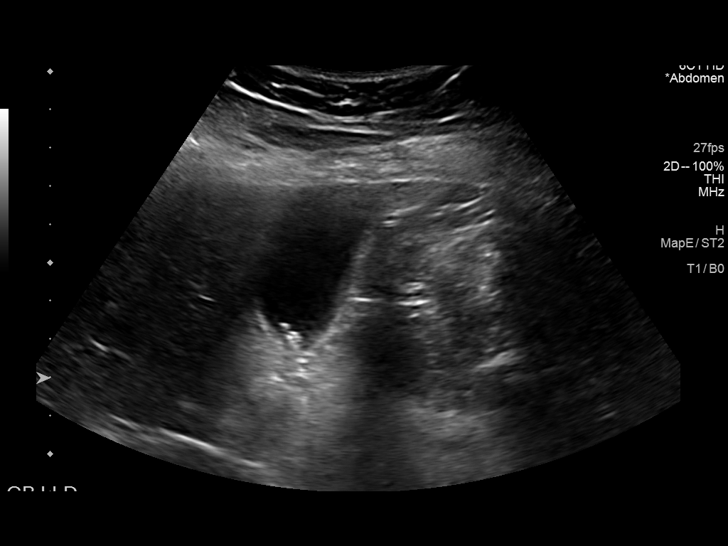

[14 of 25 positions shown; findings below may reference images not displayed]

FINDINGS: Gallbladder:

Gallstones: None

Sludge: None

Gallbladder Wall: Within normal limits

Pericholecystic fluid: None

Sonographic Murphy's Sign: Negative per technologist

Non-mobile, echogenic structure measuring 3 mm, appears adherent to
the gallbladder wall most likely represents a polyp or tumefactive
sludge.

Common bile duct:

Diameter: 4 mm

Liver:

Parenchymal echogenicity: Mildly coarsened parenchymal echogenicity.

Lesions: None

Portal vein: Patent.  Hepatopetal flow

Other: None.
IMPRESSION: Mildly coarsened parenchymal echogenicity of the liver consistent
with fibrosis. No focal hepatic lesion.

## 2022-06-10 NOTE — Patient Instructions (Signed)

## 2022-06-10 NOTE — Progress Notes (Unsigned)
Future Appointments  Date Time Provider Department  06/11/2022 10:30 AM Unk Pinto, MD [facility]  12/04/2022                      cpe  11:00 AM Unk Pinto, MD [facility]     History of Present Illness:      This very nice 66 y.o. DWF presents for 6 month follow up with HTN, HLD, Pre-Diabetes and Vitamin D Deficiency.       Patient has Bipolar Manic-Depressive Disorder w/ hx/o Psychosis and is on SS Disability since 2013. She is stable on Abilify  And is followed at the [place] clinic. She also has hx/o Hepatitis C cured at the Hepatitis clinic. Also she has NAFLD & Hepatic Fibrosis monitored at the Hepatitis Clinic by Dr Zollie Scale and Roosevelt Locks, NP.  In 2021, Abdominal CT scan showed Aortic Atherosclerosis.       Patient has been followed expectantly since 2005 for her labile HTN & BP has been controlled and today's BP is at goal - 138/76.  Patient has CKD3a (GFR 53) attributed to her HTVD. Patient has had no complaints of any cardiac type chest pain, palpitations, dyspnea / orthopnea / PND, dizziness, claudication, or dependent edema.      Hyperlipidemia is controlled with diet & Rosuvastatin. Patient denies myalgias or other med SE's. Last Lipids were at goal :  Lab Results  Component Value Date   CHOL 128 03/06/2022   HDL 47 (L) 03/06/2022   LDLCALC 62 03/06/2022   TRIG 105 03/06/2022   CHOLHDL 2.7 03/06/2022       Also, the patient has history of PreDiabetes /Insulin Resistance  and has had no symptoms of reactive hypoglycemia, diabetic polys, paresthesias or visual blurring.  Last A1c was Normal & at goal :  Lab Results  Component Value Date   HGBA1C 5.0 11/28/2021       Patient was initiated on Thyroid Replacement in 2002.      Further, the patient also has history of Vitamin D Deficiency (18" /2017 and "29" /2015)   and supplements vitamin D without any suspected side-effects. Last vitamin D was at goal :  Lab Results  Component  Value Date   VD25OH 71 11/28/2021    Current Outpatient Medications  Medication Instructions   ARIPiprazole (ABILIFY) 10 mg, Oral, Daily   bisoprolol-hctz (ZIAC) 10-6.25 MG tablet Take  1 tablet   Daily  for BP   Levothyroxine 75 MCG tablet Take  1 tablet  Daily                                                 MAGNESIUM 500 mg daily   rosuvastatin (CRESTOR) 5 MG tablet TAKE 1 TABLET  DAILY    traZODone 50 MG tablet 1/2-1 tablet for sleep   VITAMIN D 5,000 Units 2 times daily    Allergies  Allergen Reactions   Sulfa Antibiotics Rash   PMHx:   Past Medical History:  Diagnosis Date   Anxiety    Depression    Gout 05/17/2016   Hepatitis    Hypothyroidism    Labile hypertension    Mental disorder    Prediabetes    Thyroid disease    Immunization History  Administered Date(s) Administered   Influenza Inj Mdck Quad With Preservative  08/02/2019   PFIZER(Purple Top)SARS-COV-2 Vaccination 05/05/2020, 05/05/2020, 05/26/2020   PNEUMOCOCCAL CONJUGATE-20 03/06/2022   Pneumococcal-Unspecified 10/14/2001   Td 10/15/1995   Past Surgical History:  Procedure Laterality Date   arm fracture     COLONOSCOPY  20 + years ago    at [employer] per pt, normal exam   ROTATOR CUFF REPAIR     FHx:    Reviewed / unchanged  SHx:    Reviewed / unchanged   Systems Review:  Constitutional: Denies fever, chills, wt changes, headaches, insomnia, fatigue, night sweats, change in appetite. Eyes: Denies redness, blurred vision, diplopia, discharge, itchy, watery eyes.  ENT: Denies discharge, congestion, post nasal drip, epistaxis, sore throat, earache, hearing loss, dental pain, tinnitus, vertigo, sinus pain, snoring.  CV: Denies chest pain, palpitations, irregular heartbeat, syncope, dyspnea, diaphoresis, orthopnea, PND, claudication or edema. Respiratory: denies cough, dyspnea, DOE, pleurisy, hoarseness, laryngitis, wheezing.  Gastrointestinal: Denies dysphagia, odynophagia, heartburn, reflux, water  brash, abdominal pain or cramps, nausea, vomiting, bloating, diarrhea, constipation, hematemesis, melena, hematochezia  or hemorrhoids. Genitourinary: Denies dysuria, frequency, urgency, nocturia, hesitancy, discharge, hematuria or flank pain. Musculoskeletal: Denies arthralgias, myalgias, stiffness, jt. swelling, pain, limping or strain/sprain.  Skin: Denies pruritus, rash, hives, warts, acne, eczema or change in skin lesion(s). Neuro: No weakness, tremor, incoordination, spasms, paresthesia or pain. Psychiatric: Denies confusion, memory loss or sensory loss. Endo: Denies change in weight, skin or hair change.  Heme/Lymph: No excessive bleeding, bruising or enlarged lymph nodes.  Physical Exam  There were no vitals taken for this visit.  Appears  well nourished, well groomed  and in no distress.  Eyes: PERRLA, EOMs, conjunctiva no swelling or erythema. Sinuses: No frontal/maxillary tenderness ENT/Mouth: EAC's clear, TM's nl w/o erythema, bulging. Nares clear w/o erythema, swelling, exudates. Oropharynx clear without erythema or exudates. Oral hygiene is good. Tongue normal, non obstructing. Hearing intact.  Neck: Supple. Thyroid not palpable. Car 2+/2+ without bruits, nodes or JVD. Chest: Respirations nl with BS clear & equal w/o rales, rhonchi, wheezing or stridor.  Cor: Heart sounds normal w/ regular rate and rhythm without sig. murmurs, gallops, clicks or rubs. Peripheral pulses normal and equal  without edema.  Abdomen: Soft & bowel sounds normal. Non-tender w/o guarding, rebound, hernias, masses or organomegaly.  Lymphatics: Unremarkable.  Musculoskeletal: Full ROM all peripheral extremities, joint stability, 5/5 strength and normal gait.  Skin: Warm, dry without exposed rashes, lesions or ecchymosis apparent.  Neuro: Cranial nerves intact, reflexes equal bilaterally. Sensory-motor testing grossly intact. Tendon reflexes grossly intact.  Pysch: Alert & oriented x 3.  Insight and  judgement nl & appropriate. No ideations.  Assessment and Plan:  1. Labile hypertension  - Continue medication, monitor blood pressure at home.  - Continue DASH diet.  Reminder to go to the ER if any CP,  SOB, nausea, dizziness, severe HA, changes vision/speech.  - CBC with Differential/Platelet - COMPLETE METABOLIC PANEL WITH GFR - Magnesium - TSH  2. Hyperlipidemia, mixed  - Continue diet/meds, exercise,& lifestyle modifications.  - Continue monitor periodic cholesterol/liver & renal functions   - Lipid panel - TSH  3. Abnormal glucose  - Continue diet, exercise  - Lifestyle modifications.  - Monitor appropriate labs.  - Hemoglobin A1c - Insulin, random  4. Vitamin D deficiency  - Continue supplementation.  - VITAMIN D 25 Hydroxyl  5. Hypothyroidism  - TSH  6. Medication management  - CBC with Differential/Platelet - COMPLETE METABOLIC PANEL WITH GFR - Magnesium - Lipid panel - TSH - Hemoglobin A1c -  Insulin, random - VITAMIN D 25 Hydroxyl        Discussed  regular exercise, BP monitoring, weight control to achieve/maintain BMI less than 25 and discussed med and SE's. Recommended labs to assess and monitor clinical status with further disposition pending results of labs.  I discussed the assessment and treatment plan with the patient. The patient was provided an opportunity to ask questions and all were answered. The patient agreed with the plan and demonstrated an understanding of the instructions.  I provided over 30 minutes of exam, counseling, chart review and  complex critical decision making.   Kirtland Bouchard, MD

## 2022-06-11 ENCOUNTER — Encounter: Payer: Self-pay | Admitting: Internal Medicine

## 2022-06-11 ENCOUNTER — Ambulatory Visit (INDEPENDENT_AMBULATORY_CARE_PROVIDER_SITE_OTHER): Payer: PPO | Admitting: Internal Medicine

## 2022-06-11 VITALS — BP 122/70 | HR 64 | Temp 97.6°F | Resp 17 | Ht 64.0 in | Wt 186.6 lb

## 2022-06-11 DIAGNOSIS — I700 Atherosclerosis of aorta: Secondary | ICD-10-CM

## 2022-06-11 DIAGNOSIS — Z79899 Other long term (current) drug therapy: Secondary | ICD-10-CM

## 2022-06-11 DIAGNOSIS — E559 Vitamin D deficiency, unspecified: Secondary | ICD-10-CM | POA: Diagnosis not present

## 2022-06-11 DIAGNOSIS — E039 Hypothyroidism, unspecified: Secondary | ICD-10-CM

## 2022-06-11 DIAGNOSIS — N1831 Chronic kidney disease, stage 3a: Secondary | ICD-10-CM | POA: Diagnosis not present

## 2022-06-11 DIAGNOSIS — F250 Schizoaffective disorder, bipolar type: Secondary | ICD-10-CM | POA: Diagnosis not present

## 2022-06-11 DIAGNOSIS — D689 Coagulation defect, unspecified: Secondary | ICD-10-CM

## 2022-06-11 DIAGNOSIS — E782 Mixed hyperlipidemia: Secondary | ICD-10-CM | POA: Diagnosis not present

## 2022-06-11 DIAGNOSIS — K7402 Hepatic fibrosis, advanced fibrosis: Secondary | ICD-10-CM | POA: Diagnosis not present

## 2022-06-11 DIAGNOSIS — R0989 Other specified symptoms and signs involving the circulatory and respiratory systems: Secondary | ICD-10-CM

## 2022-06-11 DIAGNOSIS — K7400 Hepatic fibrosis, unspecified: Secondary | ICD-10-CM | POA: Diagnosis not present

## 2022-06-11 DIAGNOSIS — R7309 Other abnormal glucose: Secondary | ICD-10-CM | POA: Diagnosis not present

## 2022-06-12 NOTE — Progress Notes (Signed)
<><><><><><><><><><><><><><><><><><><><><><><><><><><><><><><><><> <><><><><><><><><><><><><><><><><><><><><><><><><><><><><><><><><> -   Test results slightly outside the reference range are not unusual. If there is anything important, I will review this with you,  otherwise it is considered normal test values.  If you have further questions,  please do not hesitate to contact me at the office or via My Chart.  <><><><><><><><><><><><><><><><><><><><><><><><><><><><><><><><><> <><><><><><><><><><><><><><><><><><><><><><><><><><><><><><><><><>  -  Kidney Functions ( GFR )  Still Stage 3a - & stable - about the same <><><><><><><><><><><><><><><><><><><><><><><><><><><><><><><><><>  -  Total Chol = 12 0 & LDL Chol = 55 - Both    Excellent   - Very low risk for Heart Attack  / Stroke <><><><><><><><><><><><><><><><><><><><><><><><><><><><><><><><><> <><><><><><><><><><><><><><><><><><><><><><><><><><><><><><><><><>  - A1c - Normal - No Diabetes  - Great !  <><><><><><><><><><><><><><><><><><><><><><><><><><><><><><><><><>  -  PT/ INR Clotting test is WNL  <><><><><><><><><><><><><><><><><><><><><><><><><><><><><><><><><>  -  AFP tumor  marker & PTT tests still pending.  <><><><><><><><><><><><><><><><><><><><><><><><><><><><><><><><><> <><><><><><><><><><><><><><><><><><><><><><><><><><><><><><><><><>

## 2022-06-13 LAB — LIPID PANEL
Cholesterol: 120 mg/dL (ref <200)
HDL: 47 mg/dL — ABNORMAL LOW (ref >=50)
LDL Cholesterol (Calc): 55 mg/dL (calc)
Non-HDL Cholesterol (Calc): 73 mg/dL (calc) (ref <130)
Total CHOL/HDL Ratio: 2.6 (calc) (ref <5.0)
Triglycerides: 92 mg/dL (ref <150)

## 2022-06-13 LAB — VITAMIN D 25 HYDROXY (VIT D DEFICIENCY, FRACTURES): Vit D, 25-Hydroxy: 80 ng/mL (ref 30–100)

## 2022-06-13 LAB — CBC WITH DIFFERENTIAL/PLATELET
Absolute Monocytes: 371 cells/uL (ref 200–950)
Basophils Absolute: 58 cells/uL (ref 0–200)
Basophils Relative: 1.1 %
Eosinophils Absolute: 159 cells/uL (ref 15–500)
Eosinophils Relative: 3 %
HCT: 41.1 % (ref 35.0–45.0)
Hemoglobin: 13.6 g/dL (ref 11.7–15.5)
Lymphs Abs: 1283 cells/uL (ref 850–3900)
MCH: 29.8 pg (ref 27.0–33.0)
MCHC: 33.1 g/dL (ref 32.0–36.0)
MCV: 90.1 fL (ref 80.0–100.0)
MPV: 10.2 fL (ref 7.5–12.5)
Monocytes Relative: 7 %
Neutro Abs: 3429 cells/uL (ref 1500–7800)
Neutrophils Relative %: 64.7 %
Platelets: 214 10*3/uL (ref 140–400)
RBC: 4.56 10*6/uL (ref 3.80–5.10)
RDW: 12.1 % (ref 11.0–15.0)
Total Lymphocyte: 24.2 %
WBC: 5.3 10*3/uL (ref 3.8–10.8)

## 2022-06-13 LAB — COMPLETE METABOLIC PANEL WITH GFR
AG Ratio: 1.9 (calc) (ref 1.0–2.5)
ALT: 15 U/L (ref 6–29)
AST: 18 U/L (ref 10–35)
Albumin: 4.6 g/dL (ref 3.6–5.1)
Alkaline phosphatase (APISO): 65 U/L (ref 37–153)
BUN/Creatinine Ratio: 14 (calc) (ref 6–22)
BUN: 15 mg/dL (ref 7–25)
CO2: 24 mmol/L (ref 20–32)
Calcium: 9.8 mg/dL (ref 8.6–10.4)
Chloride: 105 mmol/L (ref 98–110)
Creat: 1.06 mg/dL — ABNORMAL HIGH (ref 0.50–1.05)
Globulin: 2.4 g/dL (calc) (ref 1.9–3.7)
Glucose, Bld: 94 mg/dL (ref 65–99)
Potassium: 4.5 mmol/L (ref 3.5–5.3)
Sodium: 139 mmol/L (ref 135–146)
Total Bilirubin: 0.5 mg/dL (ref 0.2–1.2)
Total Protein: 7 g/dL (ref 6.1–8.1)
eGFR: 58 mL/min/{1.73_m2} — ABNORMAL LOW (ref >=60)

## 2022-06-13 LAB — PROTIME-INR
INR: 1
Prothrombin Time: 11 s (ref 9.0–11.5)

## 2022-06-13 LAB — HEMOGLOBIN A1C
Hgb A1c MFr Bld: 5.1 % of total Hgb (ref <5.7)
Mean Plasma Glucose: 100 mg/dL
eAG (mmol/L): 5.5 mmol/L

## 2022-06-13 LAB — TSH: TSH: 1.34 mIU/L (ref 0.40–4.50)

## 2022-06-13 LAB — MAGNESIUM: Magnesium: 2.1 mg/dL (ref 1.5–2.5)

## 2022-06-13 LAB — AFP TUMOR MARKER: AFP-Tumor Marker: 9.5 ng/mL — ABNORMAL HIGH

## 2022-06-13 LAB — INSULIN, RANDOM: Insulin: 18.2 u[IU]/mL

## 2022-06-22 ENCOUNTER — Other Ambulatory Visit: Payer: Self-pay | Admitting: Internal Medicine

## 2022-06-26 ENCOUNTER — Other Ambulatory Visit: Payer: Self-pay | Admitting: Nurse Practitioner

## 2022-06-26 DIAGNOSIS — K7402 Hepatic fibrosis, advanced fibrosis: Secondary | ICD-10-CM

## 2022-07-03 ENCOUNTER — Ambulatory Visit
Admission: RE | Admit: 2022-07-03 | Discharge: 2022-07-03 | Disposition: A | Discharge status: Home / Self Care | Payer: PPO | Source: Ambulatory Visit | Attending: Nurse Practitioner | Admitting: Nurse Practitioner

## 2022-07-03 DIAGNOSIS — K824 Cholesterolosis of gallbladder: Secondary | ICD-10-CM | POA: Diagnosis not present

## 2022-07-03 DIAGNOSIS — K7402 Hepatic fibrosis, advanced fibrosis: Secondary | ICD-10-CM

## 2022-07-03 DIAGNOSIS — K7460 Unspecified cirrhosis of liver: Secondary | ICD-10-CM | POA: Diagnosis not present

## 2022-07-10 DIAGNOSIS — K7402 Hepatic fibrosis, advanced fibrosis: Secondary | ICD-10-CM | POA: Diagnosis not present

## 2022-07-10 DIAGNOSIS — K760 Fatty (change of) liver, not elsewhere classified: Secondary | ICD-10-CM | POA: Diagnosis not present

## 2022-07-11 ENCOUNTER — Ambulatory Visit (INDEPENDENT_AMBULATORY_CARE_PROVIDER_SITE_OTHER): Payer: PPO | Admitting: Nurse Practitioner

## 2022-07-11 ENCOUNTER — Encounter: Payer: Self-pay | Admitting: Nurse Practitioner

## 2022-07-11 VITALS — BP 128/78 | HR 62 | Temp 96.8°F | Ht 64.0 in | Wt 187.6 lb

## 2022-07-11 DIAGNOSIS — R600 Localized edema: Secondary | ICD-10-CM | POA: Diagnosis not present

## 2022-07-11 DIAGNOSIS — M84374A Stress fracture, right foot, initial encounter for fracture: Secondary | ICD-10-CM | POA: Diagnosis not present

## 2022-07-11 DIAGNOSIS — R52 Pain, unspecified: Secondary | ICD-10-CM | POA: Diagnosis not present

## 2022-07-11 NOTE — Progress Notes (Signed)
Assessment and Plan:  Rebekah Pace was seen today for an episodic visit.  Diagnoses and all order for this visit:  1. Stress fracture of metatarsal bone of right foot, initial encounter RICE Method Suggested staying off of foot x 1 week. Purchase OTC support boot when walking. Continue to monitor  2. Localized edema RICE  3. Pain aggravated by walking Apply topical voltaren gel Continue Tylenol.    Notify office for further evaluation and treatment, questions or concerns if s/s fail to improve. The risks and benefits of my recommendations, as well as other treatment options were discussed with the patient today. Questions were answered.  Further disposition pending results of labs. Discussed med's effects and SE's.    Over 20 minutes of exam, counseling, chart review, and critical decision making was performed.   Future Appointments  Date Time Provider [place]  12/04/2022 11:00 AM Unk Pinto, MD [facility] None  03/12/2023 11:30 AM Alycia Rossetti, NP [facility] None    ------------------------------------------------------------------------------------------------------------------   HPI BP 128/78   Pulse 62   Temp (!) 96.8 F (36 C)   Ht '5\' 4"'$  (1.626 m)   Wt 187 lb 9.6 oz (85.1 kg)   SpO2 98%   BMI 32.20 kg/m   66 y.o.female presents for right foot pain.  She reports she started walking 2 miles about 1 month ago.  She bought some new inserts for her tennis shoe.  She has been walking on hard pavement with the inserts but then switched to the soft pavement [employer]. She noticed that last Thrusay (1 week ag) that she was unable to walk.  She has been soaking in [place] and taking Tylenol, also elevated the foot and placed heat on the area.  Of note, she has a hx of osteopenia.Denies any fall, trauma, injury.  Past Medical History:  Diagnosis Date   Anxiety    Depression    Gout 05/17/2016   Hepatitis    Hypothyroidism    Labile hypertension    Mental  disorder    Prediabetes    Thyroid disease      Allergies  Allergen Reactions   Sulfa Antibiotics Rash    Current Outpatient Medications on File Prior to Visit  Medication Sig   ARIPiprazole (ABILIFY) 10 MG tablet Take 10 mg by mouth daily.   bisoprolol-hydrochlorothiazide (ZIAC) 10-6.25 MG tablet Take 1 tablet  Daily for BP                                                 /                                          TAKE                                BY                                           MOUTH   levothyroxine (SYNTHROID) 75 MCG tablet Take  1 tablet  Daily  on an empty stomach with only water for  30 minutes & no Antacid meds, Calcium or Magnesium for 4 hours & avoid Biotin                                                   /                                   TAKE                                      BY                     MOUTH   MAGNESIUM PO Take 500 mg by mouth.    rosuvastatin (CRESTOR) 5 MG tablet TAKE 1 TABLET BY MOUTH DAILY FOR CHOLESTEROL   traZODone (DESYREL) 50 MG tablet 1/2-1 tablet for sleep   VITAMIN D, CHOLECALCIFEROL, PO Take 5,000 Units by mouth 2 (two) times daily.    No current facility-administered medications on file prior to visit.    ROS: all negative except what is noted in the HPI.   Physical Exam:  BP 128/78   Pulse 62   Temp (!) 96.8 F (36 C)   Ht '5\' 4"'$  (1.626 m)   Wt 187 lb 9.6 oz (85.1 kg)   SpO2 98%   BMI 32.20 kg/m   General Appearance: NAD.  Awake, conversant and cooperative. Eyes: PERRLA, EOMs intact.  Sclera white.  Conjunctiva without erythema. Sinuses: No frontal/maxillary tenderness.  No nasal discharge. Nares patent.  ENT/Mouth: Ext aud canals clear.  Bilateral TMs w/DOL and without erythema or bulging. Hearing intact.  Posterior pharynx without swelling or exudate.  Tonsils without swelling or erythema.  Neck: Supple.  No masses, nodules or thyromegaly. Respiratory: Effort is regular with non-labored breathing. Breath sounds are  equal bilaterally without rales, rhonchi, wheezing or stridor.  Cardio: RRR with no MRGs. Brisk peripheral pulses without edema.  Abdomen: Active BS in all four quadrants.  Soft and non-tender without guarding, rebound tenderness, hernias or masses. Lymphatics: Non tender without lymphadenopathy.  Musculoskeletal: Pain with dorsiflexion of right foot.  Mild edema.  No erythema.  Tender to palpation along distal metatarsals.  Skin: Appropriate color for ethnicity. Warm without rashes, lesions, ecchymosis, ulcers.  Neuro: CN II-XII grossly normal. Normal muscle tone without cerebellar symptoms and intact sensation.   Psych: AO X 3,  appropriate mood and affect, insight and judgment.     Darrol Jump, NP 10:45 AM [facility]

## 2022-07-11 NOTE — Patient Instructions (Signed)
Voltaren Cream Topical  Pursue a combination of weight-bearing exercises and strength training. Patients with severe mobility impairment should be referred for physical therapy. Advised on fall prevention measures including proper lighting in all rooms, removal of area rugs and floor clutter, use of walking devices as deemed appropriate, avoidance of uneven walking surfaces. Smoking cessation and moderate alcohol consumption if applicable Consume 106 to 1000 IU of vitamin D daily with a goal vitamin D serum value of 30 ng/mL or higher. Aim for 1000 to 1200 mg of elemental calcium daily through supplements and/or dietary sources.  Osteopenia  Osteopenia is a loss of thickness (density) inside the bones. Another name for osteopenia is low bone mass. Mild osteopenia is a normal part of aging. It is not a disease, and it does not cause symptoms. However, if you have osteopenia and continue to lose bone mass, you could develop a condition that causes the bones to become thin and break more easily (osteoporosis). Osteoporosis can cause you to lose some height, have back pain, and have a stooped posture. Although osteopenia is not a disease, making changes to your lifestyle and diet can help to prevent osteopenia from developing into osteoporosis. What are the causes? Osteopenia is caused by loss of calcium in the bones. Bones are constantly changing. Old bone cells are continually being replaced with new bone cells. This process builds new bone. The mineral calcium is needed to build new bone and maintain bone density. Bone density is usually highest around age 24. After that, most people's bodies cannot replace all the bone they have lost with new bone. What increases the risk? You are more likely to develop this condition if: You are older than age 26. You are a woman who went through menopause early. You have a long illness that keeps you in bed. You do not get enough exercise. You lack certain  nutrients (malnutrition). You have an overactive thyroid gland (hyperthyroidism). You use products that contain nicotine or tobacco, such as cigarettes, e-cigarettes and chewing tobacco, or you drink a lot of alcohol. You are taking medicines that weaken the bones, such as steroids. What are the signs or symptoms? This condition does not cause any symptoms. You may have a slightly higher risk for bone breaks (fractures), so getting fractures more easily than normal may be an indication of osteopenia. How is this diagnosed? This condition may be diagnosed based on an X-ray exam that measures bone density (dual-energy X-ray absorptiometry, or DEXA). This test can measure bone density in your hips, spine, and wrists. Osteopenia has no symptoms, so this condition is usually diagnosed after a routine bone density screening test is done for osteoporosis. This routine screening is usually done for: Women who are age 88 or older. Men who are age 48 or older. If you have risk factors for osteopenia, you may have the screening test at an earlier age. How is this treated? Making dietary and lifestyle changes can lower your risk for osteoporosis. If you have severe osteopenia that is close to becoming osteoporosis, this condition can be treated with medicines and dietary supplements such as calcium and vitamin D. These supplements help to rebuild bone density. Follow these instructions at home: Eating and drinking Eat a diet that is high in calcium and vitamin D. Calcium is found in dairy products, beans, salmon, and leafy green vegetables like spinach and broccoli. Look for foods that have vitamin D and calcium added to them (fortified foods), such as orange juice, cereal, and  bread.  Lifestyle Do 30 minutes or more of a weight-bearing exercise every day, such as walking, jogging, or playing a sport. These types of exercises strengthen the bones. Do not use any products that contain nicotine or tobacco,  such as cigarettes, e-cigarettes, and chewing tobacco. If you need help quitting, ask your health care provider. Do not drink alcohol if: Your health care provider tells you not to drink. You are pregnant, may be pregnant, or are planning to become pregnant. If you drink alcohol: Limit how much you use to: 0-1 drink a day for women. 0-2 drinks a day for men. Be aware of how much alcohol is in your drink. In the [place] one drink equals one 12 oz bottle of beer (355 mL), one 5 oz glass of wine (148 mL), or one 1 oz glass of hard liquor (44 mL). General instructions Take over-the-counter and prescription medicines only as told by your health care provider. These include vitamins and supplements. Take precautions at home to lower your risk of falling, such as: Keeping rooms well-lit and free of clutter, such as cords. Installing safety rails on stairs. Using rubber mats in the bathroom or other areas that are often wet or slippery. Keep all follow-up visits. This is important. Contact a health care provider if: You have not had a bone density screening for osteoporosis and you are: A woman who is age 51 or older. A man who is age 31 or older. You are a postmenopausal woman who has not had a bone density screening for osteoporosis. You are older than age 81 and you want to know if you should have bone density screening for osteoporosis. Summary Osteopenia is a loss of thickness (density) inside the bones. Another name for osteopenia is low bone mass. Osteopenia is not a disease, but it may increase your risk for a condition that causes the bones to become thin and break more easily (osteoporosis). You may be at risk for osteopenia if you are older than age 55 or if you are a woman who went through early menopause. Osteopenia does not cause any symptoms, but it can be diagnosed with a bone density screening test. Dietary and lifestyle changes are the first treatment for osteopenia. These may  lower your risk for osteoporosis. This information is not intended to replace advice given to you by your health care provider. Make sure you discuss any questions you have with your health care provider. Document Revised: 03/16/2020 Document Reviewed: 03/16/2020 Elsevier Patient Education  [employer].

## 2022-07-30 NOTE — Progress Notes (Unsigned)
Assessment and Plan:  There are no diagnoses linked to this encounter.    Further disposition pending results of labs. Discussed med's effects and SE's.   Over 30 minutes of exam, counseling, chart review, and critical decision making was performed.   Future Appointments  Date Time Provider [place]  07/31/2022 10:00 AM Alycia Rossetti, NP [facility] None  12/04/2022 11:00 AM Unk Pinto, MD [facility] None  03/12/2023 11:30 AM Alycia Rossetti, NP [facility] None    ------------------------------------------------------------------------------------------------------------------   HPI There were no vitals taken for this visit. 66 y.o.female presents for  Past Medical History:  Diagnosis Date   Anxiety    Depression    Gout 05/17/2016   Hepatitis    Hypothyroidism    Labile hypertension    Mental disorder    Prediabetes    Thyroid disease      Allergies  Allergen Reactions   Sulfa Antibiotics Rash    Current Outpatient Medications on File Prior to Visit  Medication Sig   ARIPiprazole (ABILIFY) 10 MG tablet Take 10 mg by mouth daily.   bisoprolol-hydrochlorothiazide (ZIAC) 10-6.25 MG tablet Take 1 tablet  Daily for BP                                                 /                                          TAKE                                BY                                           MOUTH   levothyroxine (SYNTHROID) 75 MCG tablet Take  1 tablet  Daily  on an empty stomach with only water for 30 minutes & no Antacid meds, Calcium or Magnesium for 4 hours & avoid Biotin                                                   /                                   TAKE                                      BY                     MOUTH   MAGNESIUM PO Take 500 mg by mouth.    rosuvastatin (CRESTOR) 5 MG tablet TAKE 1 TABLET BY MOUTH DAILY FOR CHOLESTEROL   traZODone (DESYREL) 50 MG tablet 1/2-1 tablet for sleep   VITAMIN D, CHOLECALCIFEROL, PO Take 5,000 Units by  mouth 2 (two) times daily.  No current facility-administered medications on file prior to visit.    ROS: all negative except above.   Physical Exam:  There were no vitals taken for this visit.  General Appearance: Well nourished, in no apparent distress. Eyes: PERRLA, EOMs, conjunctiva no swelling or erythema Sinuses: No Frontal/maxillary tenderness ENT/Mouth: Ext aud canals clear, TMs without erythema, bulging. No erythema, swelling, or exudate on post pharynx.  Tonsils not swollen or erythematous. Hearing normal.  Neck: Supple, thyroid normal.  Respiratory: Respiratory effort normal, BS equal bilaterally without rales, rhonchi, wheezing or stridor.  Cardio: RRR with no MRGs. Brisk peripheral pulses without edema.  Abdomen: Soft, + BS.  Non tender, no guarding, rebound, hernias, masses. Lymphatics: Non tender without lymphadenopathy.  Musculoskeletal: Full ROM, 5/5 strength, normal gait.  Skin: Warm, dry without rashes, lesions, ecchymosis.  Neuro: Cranial nerves intact. Normal muscle tone, no cerebellar symptoms. Sensation intact.  Psych: Awake and oriented X 3, normal affect, Insight and Judgment appropriate.     Alycia Rossetti, NP 12:49 PM [facility]

## 2022-07-31 ENCOUNTER — Ambulatory Visit (INDEPENDENT_AMBULATORY_CARE_PROVIDER_SITE_OTHER): Payer: PPO | Admitting: Nurse Practitioner

## 2022-07-31 ENCOUNTER — Encounter: Payer: Self-pay | Admitting: Nurse Practitioner

## 2022-07-31 VITALS — BP 138/82 | HR 71 | Temp 96.6°F | Ht 64.0 in | Wt 188.8 lb

## 2022-07-31 DIAGNOSIS — I10 Essential (primary) hypertension: Secondary | ICD-10-CM | POA: Diagnosis not present

## 2022-07-31 DIAGNOSIS — R300 Dysuria: Secondary | ICD-10-CM | POA: Diagnosis not present

## 2022-07-31 DIAGNOSIS — N898 Other specified noninflammatory disorders of vagina: Secondary | ICD-10-CM | POA: Diagnosis not present

## 2022-07-31 MED ORDER — NITROFURANTOIN MONOHYD MACRO 100 MG PO CAPS: 100.0000 mg | ORAL_CAPSULE | Freq: Two times a day (BID) | ORAL | 0 refills | Status: AC

## 2022-08-01 LAB — URINALYSIS, ROUTINE W REFLEX MICROSCOPIC
Bacteria, UA: NONE SEEN /HPF
Bilirubin Urine: NEGATIVE
Glucose, UA: NEGATIVE
Hyaline Cast: NONE SEEN /LPF
Ketones, ur: NEGATIVE
Nitrite: NEGATIVE
Protein, ur: NEGATIVE
Specific Gravity, Urine: 1.014 (ref 1.001–1.035)
pH: 5.5 (ref 5.0–8.0)

## 2022-08-01 LAB — URINE CULTURE
MICRO NUMBER:: 14068548
SPECIMEN QUALITY:: ADEQUATE

## 2022-08-01 LAB — WET PREP BY MOLECULAR PROBE
Candida species: NOT DETECTED
Gardnerella vaginalis: NOT DETECTED
MICRO NUMBER:: 14069178
SPECIMEN QUALITY:: ADEQUATE
Trichomonas vaginosis: NOT DETECTED

## 2022-08-01 LAB — MICROSCOPIC MESSAGE

## 2022-08-02 NOTE — Progress Notes (Signed)
Patient is aware of lab results and instructions to complete the antibiotic. -e welch

## 2022-09-30 ENCOUNTER — Encounter: Payer: Self-pay | Admitting: Internal Medicine

## 2022-10-05 ENCOUNTER — Other Ambulatory Visit: Payer: Self-pay | Admitting: Internal Medicine

## 2022-10-05 MED ORDER — PROMETHAZINE-DM 6.25-15 MG/5ML PO SYRP: ORAL_SOLUTION | ORAL | 1 refills | Status: DC

## 2022-10-05 MED ORDER — BENZONATATE 200 MG PO CAPS: ORAL_CAPSULE | ORAL | 1 refills | Status: DC

## 2022-10-05 MED ORDER — DEXAMETHASONE 4 MG PO TABS: ORAL_TABLET | ORAL | 0 refills | Status: DC

## 2022-10-11 ENCOUNTER — Telehealth: Payer: Self-pay | Admitting: Internal Medicine

## 2022-10-11 NOTE — Progress Notes (Signed)
  Chronic Care Management   Outreach Note  10/11/2022 Name: SHELSEY RIETH MRN: [id number] DOB: November 01, 1955  Referred by: Unk Pinto, MD Reason for referral : No chief complaint on file.   An unsuccessful telephone outreach was attempted today. The patient was referred to the pharmacist for assistance with care management and care coordination.   Follow Up Plan:   Tatjana Dellinger [facility] Scheduler

## 2022-10-21 DIAGNOSIS — F250 Schizoaffective disorder, bipolar type: Secondary | ICD-10-CM | POA: Diagnosis not present

## 2022-10-21 DIAGNOSIS — R251 Tremor, unspecified: Secondary | ICD-10-CM | POA: Diagnosis not present

## 2022-10-21 DIAGNOSIS — F5101 Primary insomnia: Secondary | ICD-10-CM | POA: Diagnosis not present

## 2022-11-12 ENCOUNTER — Ambulatory Visit: Payer: Medicare Other | Admitting: Pharmacy Technician

## 2022-11-12 DIAGNOSIS — Z79899 Other long term (current) drug therapy: Secondary | ICD-10-CM

## 2022-11-12 DIAGNOSIS — R0989 Other specified symptoms and signs involving the circulatory and respiratory systems: Secondary | ICD-10-CM

## 2022-11-12 DIAGNOSIS — E782 Mixed hyperlipidemia: Secondary | ICD-10-CM

## 2022-11-13 NOTE — Progress Notes (Signed)
Initial Pharmacist Visit ([facility])  Botting,Deem  31 years, Female  DOB: [birth year]  M:   __________________________________________________  Chronic Conditions Patient's Chronic Conditions: Hypertension (HTN), Hypothyroidism, Chronic Kidney Disease (CKD), Hyperlipidemia/Dyslipidemia (HLD), Vitamins/Supplements, Gout  Summary for PCP:  1. PCP- add osteopenia dx to problem list. 2. PCP- needs updated DEXA 3. PCP- Pt is splitting bisoprolol/HCTZ- can she have order updated to reflect this 1/2 tablet dose for medication adherence rate or have order changed to Bisoprolol/HCTZ 5/6.'25mg'$ ?  Doctor and Hospital Visits Were there PCP Visits in last 6 months?: Yes PCP Visits details: 07/31/22(Wilkinson, NP) Acute Visit  for complaints of burning on urination x 5 days. Start: nitrofurantoin, macrocrystal-monohydrate, (MACROBID) 100 MG  07/11/22(Cranford NP) Right foot pain, No medication changes.  06/11/22(McKeown, MD) 6 month follow up visit. No medication changes noted.   Were there Specialist Visits in last 6 months?: Yes Specialist Visits details: 07/10/22(Drazek, NP-[facility]) Follow up visit. No medication changes noted. Follow up in 6 months. Was there a Hospital Visit in last 30 days?: No Were there other Hospital Visits in last 6 months?: No  Disease Assessments Visit Date Visit Completed on: 11/12/2022 Subjective Information Did patient bring medications to appointment?: No What source (s) was used to reconcile medications this visit?: EMR list, Verbal recall from patient/caregiver Subjective: Completed telehealth visit with patient for CPP Initial Visit for [facility] Program. Pt lives alone but has a significant other that she enjoys spending time with, but also enjoys reading, and is interested in looking into a [place] near her to help with socialization. Has son that lives locally that checks in on her frequently. She is still independent and drives, cleans  and does chores at her home, grocery shops, and picks up her medications. She is ambulatory without assistance and has not had a recent fall. She does not have any noted concerns today with medications or medical concerns. Lifestyle habits: Diet: standard diet, but would like to lose weight Exercise: walked previously, would like to get back into walking Tobacco: denies Alcohol: denies Caffeine: diet coke Recreational drugs: denies What is the patient's sleep pattern?: No sleep issues How many hours per night does patient typically sleep?: 6-8 hours   SDOH: [place] Needs Screening Tool ([email or web address]) SDOH questions were documented and reviewed (EMR or [employer]) within the past 12 months or since hospitalization?: No What is your living situation today? (ref #1): I have a steady place to live Think about the place you live. Do you have problems with any of the following? (ref #2): None of the above Within the past 12 months, you worried that your food would run out before you got money to buy more (ref #3): Never true Within the past 12 months, the food you bought just didn't last and you didn't have money to get more (ref #4): Never true In the past 12 months, has lack of reliable transportation kept you from medical appointments, meetings, work or from getting things needed for daily living? (ref #5): No In the past 12 months, has the electric, gas, oil, or water company threatened to shut off services in your home? (ref #6): No How often does anyone, including family and friends, physically hurt you? (ref #7): Never (1) How often does anyone, including family and friends, insult or talk down to you? (ref #8): Never (1) How often does anyone, including friends and family, threaten you with harm? (ref #9): Never (1) How often does anyone, including  family and friends, scream or curse at you?  (ref #10): Never (1)  Medication Adherence Does the [facility] have access to medication refill history?: Yes Medication adherence rates for STAR metric medications: rosuvastatin (CRESTOR) 5 MG-LF 07/31/22 90DS   **Needs refill** Medication adherence rates for non-STAR metric medications: . Factors that may affect medication adherence?: Altered mental status (dementia, substance use, mental illness) Name and location of Current pharmacy: [facility] [id number], [address] [place], [place] [address] Phone: [phone]  Fax: [phone]  Current Rx insurance plan: HTA Are meds synced by current pharmacy?: No Are meds delivered by current pharmacy?: No - delivery not available Would patient benefit from direct intervention of clinical lead in dispensing process to optimize clinical outcomes?: No Medication organization: uses vials- self organizes Assessment:: Adherent  Hypertension (HTN) Most Recent BP: 132/82 Most Recent HR: 71 taken on: 07/31/2022 Care Gap: Need BP documented or last BP 140/90 or higher: Addressed Assessed today?: Yes Goal: <130/80 mmHG Is Patient checking BP at home?: No Has patient experienced hypotension, dizziness, falls or bradycardia?: No We discussed: Reducing the amount of salt intake to '1500mg'$ /per day., Weight reduction- We discussed losing 5-10% of body weight., Proper Home BP Measurement, Increasing movement, Increasing exercise (walking, biking, swimming) to a goal of 30 minutes per day, as able based on current activity level and health or as directed by your healthcare provider. Assessment:: Controlled Drug: bisoprolol-hydrochlorothiazide (ZIAC) 10-6.25- 1/2 tablet by mouth daily  Pharmacist Assessment: Appropriate, Effective, Safe, Accessible Plan/Follow up: 1. Reports taking 1/2 tablet daily per Dr. Melford Aase; questioned if order could be changed to 5/3.'125mg'$ , but this is not commercially available. Pt is fine with  continuing to split tablets, but this will effect adherence rates- will verify with Dr. Melford Aase on which option. 2. Currently only slightly above goal, continue current regimen.  Hyperlipidemia/Dyslipidemia (HLD) Last Lipid panel on: 06/11/2022 TC (Goal<200): 120 LDL: 55 HDL (Goal>40): 47 TG (Goal<150): 92 ASCVD 10-year risk?is:: N/A due to existing ASCVD Assessed today?: Yes LDL Goal: <70 We discussed: Weight reduction- We discussed losing 5-10% of body weight Assessment:: Controlled Drug: rosuvastatin (CRESTOR) 5 MG tablet Pharmacist Assessment: Appropriate, Effective, Safe, Accessible Plan/Follow up: 1. Therapy appropriate and controlling LDL at current dose, but dose does not close gap for quality measure. Consider dose increase to moderate/high intensity if LDL should go above goal or consider alternative dosing of rosuvastatin 10-'20mg'$  TIW.  Gout Most recent uric acid: Unknown Assessed today?: No  Chronic kidney disease (CKD) Most Recent GFR: 58 taken on: 06/11/2022 Previous GFR: 54 taken on: 03/06/2022 Most recent microalbumin ratio: her urine albumin value is less than  0.2 mg/dL therefore we are unable to calculate  excretion and/or creatinine ratio.  tested on: 11/28/2021 Assessed today?: No  Hypothyroidism Most recent TSH: 1.34 taken on: 06/11/2022 Previous TSH: 2.11 taken on: 03/06/2022 Most recent T4: Unknown Assessed today?: No Drug: levothyroxine (SYNTHROID) 75 MCG tablet Pharmacist Assessment: Appropriate, Effective, Safe, Accessible Plan/Follow up: 1. No current s/sx hypothyroidism- continue current dose.  Vitamins / Supplements We discussed: Separating divalent cation supplements from levothyroxine by at least 2 hours, Confirming with pharmacist the safety of new vitamins / supplements with current medications before starting / adding to medication regimen Drug: VITAMIN D, CHOLECALCIFEROL, PO Pharmacist Assessment: Appropriate, Effective, Safe,  Accessible  Preventative Health Care Gap: Colorectal cancer screening: Addressed Care Gap: Breast cancer screening: Needs to be addressed Care Gap: Annual Wellness Visit (AWV): Addressed Immunizations needed:  Influenza, Tdap or Td, Zoster Plan/Follow up: 1. Reminded pt to scheduled yearly mammogram; pt did not receive annual flu vaccine, but did get flu in Dec 2023.  Clinical Summary Summary Next Pharmacist Follow Up: 6 months- FPO (HLD, HTN, Care gaps) Next AWV: Aug 2024 Next PCP Visit: 12/04/2022 Recommended CRN Follow-Up in: Not indicated at this time Physician Referral Statement:: I reaffirm my previous referral of patient for [facility] services  Pharmacist Interventions Intervention Details Pharmacist Interventions discussed: Yes Monitoring: Preventative health screenings, Routine monitoring Education: Lifestyle modifications, Medication administration  Marda Stalker, PharmD Clinical Pharmacist Naida Sleight.[email or web address] [phone]

## 2022-12-03 NOTE — Progress Notes (Unsigned)
Annual Screening/Preventative Visit & Comprehensive Evaluation &  Examination  Future Appointments  Date Time Provider Department  03/19/2023  2:30 PM Alycia Rossetti, NP [facility]  12/11/2023 11:00 AM Unk Pinto, MD [facility]          This very nice 67 y.o.  SWF presents for a Screening /Preventative Visit & comprehensive evaluation and management of multiple medical co-morbidities.  Patient has been followed for HTN, HLD, Prediabetes  and Vitamin D Deficiency.  [[copied:  Patient is on SS Disability for hx/o Bipolar Manic Depressive Illness w/hx/o Psychosis and is stable on only Abilify. She also has hx/ treated Hepatitis C  w/virologic cure and followed at the [facility] by Dr Zollie Scale and Roosevelt Locks, NP. She is felt to have advanced fibrotic hepatic disease. She continues f/u q74month.]]        HTN predates circa  2005.  Patient has CKD3a attributed to he HTN.  Patient's BP has been controlled at home and patient denies any cardiac symptoms as chest pain, palpitations, shortness of breath, dizziness or ankle swelling.Patient does have CKD 3a  ( GFR  55 ) . Today's BP is at goal -  128/74.       Patient's hyperlipidemia is controlled with diet and Rosuvastatin . Patient denies myalgias or other medication SE's. Last lipids were at goal :  Lab Results  Component Value Date   CHOL 120 06/11/2022   HDL 47 (L) 06/11/2022   LDLCALC 55 06/11/2022   TRIG 92 06/11/2022   CHOLHDL 2.6 06/11/2022         Patient has hx/o Morbid Obesity  (BMI 30+) & is monitored for glucose intolerance.   Patient denies reactive hypoglycemic symptoms, visual blurring, diabetic polys or paresthesias. Last A1c was normal  & at goal :  Lab Results  Component Value Date   HGBA1C 5.1 06/11/2022                                             Patient was dx'd Hypothyroid in 2002 and initiated on replacement therapy.        Finally, patient has history of Vitamin D Deficiency and last  Vitamin D was at goal:  Lab Results  Component Value Date   VD25OH 87408/29/2023        Current Outpatient Medications on File Prior to Visit  Medication Sig   ABILIFY 10 MG tablet Take 1 daily.   Nadolol 40 mg for BP& Tremor Take 1  tablet every Morning   cyclobenzaprine 5 MG tablet Take 1 tablet  3 times daily as needed    levothyroxine 75 MCG tablet Take  1 tablet  Daily    MAGNESIUM 500 mg Take daily   rosuvastatin 5 MG tablet TAKE 1 TABLET DAILY    traZODone  50 MG tablet 1/2-1 tablet for sleep   VITAMIN D 5,000 Units Take  2 times daily.      Allergies  Allergen Reactions   Sulfa Antibiotics Rash     Past Medical History:  Diagnosis Date   Anxiety    Depression    Gout 05/17/2016   Hepatitis    Hypothyroidism    Labile hypertension    Mental disorder    Prediabetes    Thyroid disease      Health Maintenance  Topic Date Due   Pneumonia Vaccine 65+ Years  old (1 - PCV) 05/21/1961   Zoster Vaccines- Shingrix (1 of 2) Never done   COVID-19 Vaccine (4 - Booster) 07/21/2020   INFLUENZA VACCINE  05/14/2021   MAMMOGRAM  09/20/2021   TETANUS/TDAP  05/24/2022 (Originally 10/14/2005)   Fecal DNA (Cologuard)  05/24/2024 (Originally 06/24/2020)   DEXA SCAN  Completed   Hepatitis C Screening  Completed   HPV VACCINES  Aged Out     Immunization History  Administered Date(s) Administered   Influenza Inj Mdck Quad   08/02/2019   PFIZER SARS-COV-2 Vacc 05/05/2020, 05/05/2020, 05/26/2020   Pneumococcal-23 10/14/2001   Td 10/15/1995     Last Colon - 01/03/2021 - Dr Bryan Lemma - Recc f/u Colon in 5 years due Apr 2027.   Last MGM - 09/23/2020 - Overdue - Patient aware   Past Surgical History:  Procedure Laterality Date   arm fracture     COLONOSCOPY  20 + years ago    at [employer] [facility] per pt, normal exam   ROTATOR CUFF REPAIR       Family History  Problem Relation Age of Onset   Hyperlipidemia Mother    Hypertension Mother    Colon polyps Mother    Colon  cancer Mother 32   Cancer Father        Lung   Hyperlipidemia Brother    Esophageal cancer Neg Hx    Rectal cancer Neg Hx    Stomach cancer Neg Hx      Social History   Tobacco Use   Smoking status: Never   Smokeless tobacco: Never  Vaping Use   Vaping Use: Never used  Substance Use Topics   Alcohol use: No   Drug use: No      ROS Constitutional: Denies fever, chills, weight loss/gain, headaches, insomnia,  night sweats, and change in appetite. Does c/o fatigue. Eyes: Denies redness, blurred vision, diplopia, discharge, itchy, watery eyes.  ENT: Denies discharge, congestion, post nasal drip, epistaxis, sore throat, earache, hearing loss, dental pain, Tinnitus, Vertigo, Sinus pain, snoring.  Cardio: Denies chest pain, palpitations, irregular heartbeat, syncope, dyspnea, diaphoresis, orthopnea, PND, claudication, edema Respiratory: denies cough, dyspnea, DOE, pleurisy, hoarseness, laryngitis, wheezing.  Gastrointestinal: Denies dysphagia, heartburn, reflux, water brash, pain, cramps, nausea, vomiting, bloating, diarrhea, constipation, hematemesis, melena, hematochezia, jaundice, hemorrhoids Genitourinary: Denies dysuria, frequency, urgency, nocturia, hesitancy, discharge, hematuria, flank pain Breast: Breast lumps, nipple discharge, bleeding.  Musculoskeletal: Denies arthralgia, myalgia, stiffness, Jt. Swelling, pain, limp, and strain/sprain. Denies falls. Skin: Denies puritis, rash, hives, warts, acne, eczema, changing in skin lesion Neuro: No weakness, tremor, incoordination, spasms, paresthesia, pain Psychiatric: Denies confusion, memory loss, sensory loss. Denies Depression. Endocrine: Denies change in weight, skin, hair change, nocturia, and paresthesia, diabetic polys, visual blurring, hyper / hypo glycemic episodes.  Heme/Lymph: No excessive bleeding, bruising, enlarged lymph nodes.  Physical Exam  BP 128/74   Pulse 72   Temp 97.9 F (36.6 C)   Resp 17   Ht 5' 4"$   (1.626 m)   Wt 190 lb (86.2 kg)   SpO2 98%   BMI 32.61 kg/m   General Appearance: Over  nourished, well groomed and in no apparent distress.  Eyes: PERRLA, EOMs, conjunctiva no swelling or erythema, normal fundi and vessels. Sinuses: No frontal/maxillary tenderness ENT/Mouth: EACs patent / TMs  nl. Nares clear without erythema, swelling, mucoid exudates. Oral hygiene is good. No erythema, swelling, or exudate. Tongue normal, non-obstructing. Tonsils not swollen or erythematous. Hearing normal.  Neck: Supple, thyroid not palpable. No bruits, nodes or JVD.  Respiratory: Respiratory effort normal.  BS equal and clear bilateral without rales, rhonci, wheezing or stridor. Cardio: Heart sounds are normal with regular rate and rhythm and no murmurs, rubs or gallops. Peripheral pulses are normal and equal bilaterally without edema. No aortic or femoral bruits. Chest: symmetric with normal excursions and percussion. Breasts: Symmetric, without lumps, nipple discharge, retractions, or fibrocystic changes.  Abdomen: Flat, soft with bowel sounds active. Nontender, no guarding, rebound, hernias, masses, or organomegaly.  Lymphatics: Non tender without lymphadenopathy.  Musculoskeletal: Full ROM all peripheral extremities, joint stability, 5/5 strength, and normal gait. Skin: Warm and dry without rashes, lesions, cyanosis, clubbing or  ecchymosis.  Neuro: Cranial nerves intact, reflexes equal bilaterally. Normal muscle tone, no cerebellar symptoms. Sensation intact. Noted high frequency, low amplitude tremor of hands.  Pysch: Alert and oriented X 3, normal affect, Insight and Judgment appropriate.    Assessment and Plan  1. Annual Preventative Screening Examination   2. Essential hypertension  - Switching Ziac to Nadolol for BP & Tremor   - Urinalysis, Routine w reflex microscopic - Microalbumin / creatinine urine ratio - CBC with Differential/Platelet - COMPLETE METABOLIC PANEL WITH GFR -  TSH - Magnesium - EKG 12-Lead  3. Hyperlipidemia, mixed  - Lipid panel - TSH - EKG 12-Lead  4. Abnormal glucose  - Hemoglobin A1c - Insulin, random - EKG 12-Lead  5. Vitamin D deficiency  - VITAMIN D 25 Hydroxy   6. Hypothyroidism  - TSH  7. Advanced hepatic fibrosis  - Protime-INR - AFP tumor marker  8. Bipolar affective disorder, depressed,  Severe, with psychotic behavior ([place])  - Discussed tapering Abilify to 1/2 tablet for 2-3 weeks &                            if OK then taper further to 1/4 tablet  for 2-3 weeks &                                                                 finally d/c if notes no change in affect     9. Stage 3a chronic kidney disease ([place])   10. Screening for ischemic heart disease  - EKG 12-Lead  11. Screening for colorectal cancer  - POC Hemoccult Bld/Stl   12. Medication management  - Urinalysis, Routine w reflex microscopic - Protime-INR - CBC with Differential/Platelet - COMPLETE METABOLIC PANEL WITH GFR - Lipid panel - TSH - Hemoglobin A1c - Insulin, random - VITAMIN D 25 Hydroxy   13. Aortic atherosclerosis ([place])          Patient was counseled in prudent diet to achieve/maintain BMI less than 25 for weight control, BP monitoring, regular exercise and medications. Discussed med's effects and SE's. Screening labs and tests as requested with regular follow-up as recommended. Over 40 minutes of exam, counseling, chart review and high complex critical decision making was performed.   Kirtland Bouchard, MD

## 2022-12-03 NOTE — Patient Instructions (Signed)

## 2022-12-04 ENCOUNTER — Ambulatory Visit (INDEPENDENT_AMBULATORY_CARE_PROVIDER_SITE_OTHER): Payer: Medicare Other | Admitting: Internal Medicine

## 2022-12-04 ENCOUNTER — Encounter: Payer: Self-pay | Admitting: Internal Medicine

## 2022-12-04 VITALS — BP 128/74 | HR 72 | Temp 97.9°F | Resp 17 | Ht 64.0 in | Wt 190.0 lb

## 2022-12-04 DIAGNOSIS — Z136 Encounter for screening for cardiovascular disorders: Secondary | ICD-10-CM

## 2022-12-04 DIAGNOSIS — R7309 Other abnormal glucose: Secondary | ICD-10-CM | POA: Diagnosis not present

## 2022-12-04 DIAGNOSIS — F250 Schizoaffective disorder, bipolar type: Secondary | ICD-10-CM

## 2022-12-04 DIAGNOSIS — I700 Atherosclerosis of aorta: Secondary | ICD-10-CM

## 2022-12-04 DIAGNOSIS — E782 Mixed hyperlipidemia: Secondary | ICD-10-CM

## 2022-12-04 DIAGNOSIS — D689 Coagulation defect, unspecified: Secondary | ICD-10-CM

## 2022-12-04 DIAGNOSIS — F315 Bipolar disorder, current episode depressed, severe, with psychotic features: Secondary | ICD-10-CM

## 2022-12-04 DIAGNOSIS — E039 Hypothyroidism, unspecified: Secondary | ICD-10-CM

## 2022-12-04 DIAGNOSIS — Z1211 Encounter for screening for malignant neoplasm of colon: Secondary | ICD-10-CM

## 2022-12-04 DIAGNOSIS — Z79899 Other long term (current) drug therapy: Secondary | ICD-10-CM | POA: Diagnosis not present

## 2022-12-04 DIAGNOSIS — G250 Essential tremor: Secondary | ICD-10-CM

## 2022-12-04 DIAGNOSIS — E559 Vitamin D deficiency, unspecified: Secondary | ICD-10-CM

## 2022-12-04 DIAGNOSIS — N1831 Chronic kidney disease, stage 3a: Secondary | ICD-10-CM | POA: Diagnosis not present

## 2022-12-04 DIAGNOSIS — I10 Essential (primary) hypertension: Secondary | ICD-10-CM | POA: Diagnosis not present

## 2022-12-04 DIAGNOSIS — Z0000 Encounter for general adult medical examination without abnormal findings: Secondary | ICD-10-CM | POA: Diagnosis not present

## 2022-12-04 DIAGNOSIS — K7402 Hepatic fibrosis, advanced fibrosis: Secondary | ICD-10-CM

## 2022-12-04 DIAGNOSIS — K7469 Other cirrhosis of liver: Secondary | ICD-10-CM

## 2022-12-04 DIAGNOSIS — Z0001 Encounter for general adult medical examination with abnormal findings: Secondary | ICD-10-CM

## 2022-12-04 MED ORDER — NADOLOL 40 MG PO TABS: ORAL_TABLET | ORAL | 3 refills | Status: DC

## 2022-12-05 LAB — URINALYSIS, ROUTINE W REFLEX MICROSCOPIC
Bilirubin Urine: NEGATIVE
Glucose, UA: NEGATIVE
Hyaline Cast: NONE SEEN /LPF
Ketones, ur: NEGATIVE
Nitrite: NEGATIVE
Protein, ur: NEGATIVE
Specific Gravity, Urine: 1.014 (ref 1.001–1.035)
pH: 5.5 (ref 5.0–8.0)

## 2022-12-05 LAB — PTH, INTACT AND CALCIUM
Calcium: 9.4 mg/dL (ref 8.6–10.4)
PTH: 56 pg/mL (ref 16–77)

## 2022-12-05 LAB — MAGNESIUM: Magnesium: 1.8 mg/dL (ref 1.5–2.5)

## 2022-12-05 LAB — CBC WITH DIFFERENTIAL/PLATELET
Absolute Monocytes: 486 cells/uL (ref 200–950)
Basophils Absolute: 70 cells/uL (ref 0–200)
Basophils Relative: 1.3 %
Eosinophils Absolute: 243 cells/uL (ref 15–500)
Eosinophils Relative: 4.5 %
HCT: 39.8 % (ref 35.0–45.0)
Hemoglobin: 13.3 g/dL (ref 11.7–15.5)
Lymphs Abs: 1237 cells/uL (ref 850–3900)
MCH: 29.6 pg (ref 27.0–33.0)
MCHC: 33.4 g/dL (ref 32.0–36.0)
MCV: 88.4 fL (ref 80.0–100.0)
MPV: 9.9 fL (ref 7.5–12.5)
Monocytes Relative: 9 %
Neutro Abs: 3364 cells/uL (ref 1500–7800)
Neutrophils Relative %: 62.3 %
Platelets: 222 10*3/uL (ref 140–400)
RBC: 4.5 10*6/uL (ref 3.80–5.10)
RDW: 12.7 % (ref 11.0–15.0)
Total Lymphocyte: 22.9 %
WBC: 5.4 10*3/uL (ref 3.8–10.8)

## 2022-12-05 LAB — MICROALBUMIN / CREATININE URINE RATIO
Creatinine, Urine: 134 mg/dL (ref 20–275)
Microalb Creat Ratio: 4 mcg/mg creat (ref <30)
Microalb, Ur: 0.6 mg/dL

## 2022-12-05 LAB — COMPLETE METABOLIC PANEL WITH GFR
AG Ratio: 1.8 (calc) (ref 1.0–2.5)
ALT: 13 U/L (ref 6–29)
AST: 18 U/L (ref 10–35)
Albumin: 4.4 g/dL (ref 3.6–5.1)
Alkaline phosphatase (APISO): 57 U/L (ref 37–153)
BUN: 15 mg/dL (ref 7–25)
CO2: 24 mmol/L (ref 20–32)
Calcium: 9.4 mg/dL (ref 8.6–10.4)
Chloride: 103 mmol/L (ref 98–110)
Creat: 1.04 mg/dL (ref 0.50–1.05)
Globulin: 2.5 g/dL (calc) (ref 1.9–3.7)
Glucose, Bld: 103 mg/dL — ABNORMAL HIGH (ref 65–99)
Potassium: 4.3 mmol/L (ref 3.5–5.3)
Sodium: 137 mmol/L (ref 135–146)
Total Bilirubin: 0.7 mg/dL (ref 0.2–1.2)
Total Protein: 6.9 g/dL (ref 6.1–8.1)
eGFR: 59 mL/min/{1.73_m2} — ABNORMAL LOW (ref >=60)

## 2022-12-05 LAB — LIPID PANEL
Cholesterol: 117 mg/dL (ref <200)
HDL: 42 mg/dL — ABNORMAL LOW (ref >=50)
LDL Cholesterol (Calc): 52 mg/dL (calc)
Non-HDL Cholesterol (Calc): 75 mg/dL (calc) (ref <130)
Total CHOL/HDL Ratio: 2.8 (calc) (ref <5.0)
Triglycerides: 157 mg/dL — ABNORMAL HIGH (ref <150)

## 2022-12-05 LAB — TSH: TSH: 1.43 mIU/L (ref 0.40–4.50)

## 2022-12-05 LAB — MICROSCOPIC MESSAGE

## 2022-12-05 LAB — HEMOGLOBIN A1C
Hgb A1c MFr Bld: 5.4 % of total Hgb (ref <5.7)
Mean Plasma Glucose: 108 mg/dL
eAG (mmol/L): 6 mmol/L

## 2022-12-05 LAB — INSULIN, RANDOM: Insulin: 26.1 u[IU]/mL — ABNORMAL HIGH

## 2022-12-05 LAB — VITAMIN D 25 HYDROXY (VIT D DEFICIENCY, FRACTURES): Vit D, 25-Hydroxy: 87 ng/mL (ref 30–100)

## 2022-12-07 ENCOUNTER — Other Ambulatory Visit: Payer: Self-pay | Admitting: Internal Medicine

## 2022-12-07 NOTE — Progress Notes (Signed)
<><><><><><><><><><><><><><><><><><><><><><><><><><><><><><><><><> <><><><><><><><><><><><><><><><><><><><><><><><><><><><><><><><><>  -    Total Chol = 117   &   LDL Chol = 52   - Both  Excellent   - Very low risk for Heart Attack  / Stroke <><><><><><><><><><><><><><><><><><><><><><><><><><><><><><><><><>  -  PTH - is hormone that regulates   Calcium balance  & is Normal  / OK  <><><><><><><><><><><><><><><><><><><><><><><><><><><><><><><><><>  -  Magnesium = 1.8    is  very  low - goal is betw 2.0 - 2.5,   - So..............Marland Kitchen  Recommend that you take Magnesium 500 mg tablet  2 x/day w/ meals  - also important to eat lots of  leafy green vegetables   - spinach - Kale - collards - greens - okra - asparagus  - broccoli - quinoa - squash - almonds   - black, red, white beans  -  peas - green beans <><><><><><><><><><><><><><><><><><><><><><><><><><><><><><><><><>  -  A1c - Normal -- No Diabetes  - Great  <><><><><><><><><><><><><><><><><><><><><><><><><><><><><><><><><>  - Vitamin D = 87  - Excellent   - Please keep dose same <><><><><><><><><><><><><><><><><><><><><><><><><><><><><><><><><>  - All Else - CBC - Kidneys - Electrolytes - Liver - Magnesium & Thyroid    - all  Normal / OK <><><><><><><><><><><><><><><><><><><><><><><><><><><><><><><><><> <><><><><><><><><><><><><><><><><><><><><><><><><><><><><><><><><>  - Keep up the [place] Work  !    <><><><><><><><><><><><><><><><><><><><><><><><><><><><><><><><><> <><><><><><><><><><><><><><><><><><><><><><><><><><><><><><><><><>

## 2022-12-09 NOTE — Progress Notes (Signed)
lmom 

## 2022-12-09 NOTE — Progress Notes (Signed)
Patient is aware of lab results and instructions. -e welch

## 2022-12-19 ENCOUNTER — Other Ambulatory Visit: Payer: Self-pay | Admitting: Internal Medicine

## 2022-12-19 DIAGNOSIS — E782 Mixed hyperlipidemia: Secondary | ICD-10-CM

## 2022-12-19 MED ORDER — ROSUVASTATIN CALCIUM 5 MG PO TABS: ORAL_TABLET | ORAL | 3 refills | Status: DC

## 2023-01-13 ENCOUNTER — Other Ambulatory Visit: Payer: Self-pay | Admitting: Nurse Practitioner

## 2023-01-13 DIAGNOSIS — K7402 Hepatic fibrosis, advanced fibrosis: Secondary | ICD-10-CM

## 2023-01-20 DIAGNOSIS — K760 Fatty (change of) liver, not elsewhere classified: Secondary | ICD-10-CM | POA: Diagnosis not present

## 2023-01-20 DIAGNOSIS — K7402 Hepatic fibrosis, advanced fibrosis: Secondary | ICD-10-CM | POA: Diagnosis not present

## 2023-01-29 ENCOUNTER — Encounter: Payer: Self-pay | Admitting: Internal Medicine

## 2023-02-04 ENCOUNTER — Ambulatory Visit
Admission: RE | Admit: 2023-02-04 | Discharge: 2023-02-04 | Disposition: A | Discharge status: Home / Self Care | Payer: Medicare Other | Source: Ambulatory Visit | Attending: Nurse Practitioner | Admitting: Nurse Practitioner

## 2023-02-04 DIAGNOSIS — K824 Cholesterolosis of gallbladder: Secondary | ICD-10-CM | POA: Diagnosis not present

## 2023-02-04 DIAGNOSIS — K7402 Hepatic fibrosis, advanced fibrosis: Secondary | ICD-10-CM

## 2023-02-19 ENCOUNTER — Other Ambulatory Visit: Payer: Self-pay

## 2023-02-19 ENCOUNTER — Other Ambulatory Visit: Payer: Self-pay | Admitting: Internal Medicine

## 2023-02-19 DIAGNOSIS — E039 Hypothyroidism, unspecified: Secondary | ICD-10-CM

## 2023-02-19 MED ORDER — LEVOTHYROXINE SODIUM 75 MCG PO TABS: ORAL_TABLET | ORAL | 3 refills | Status: DC

## 2023-03-11 DIAGNOSIS — H52203 Unspecified astigmatism, bilateral: Secondary | ICD-10-CM | POA: Diagnosis not present

## 2023-03-12 ENCOUNTER — Ambulatory Visit: Payer: Medicare Other | Admitting: Nurse Practitioner

## 2023-03-18 NOTE — Progress Notes (Unsigned)
Patient ID: Rebekah Pace, female   DOB: 12/23/1955, 67 y.o.   MRN: [id number]  Medicare  Annual  Wellness Visit And follow up  Assessment:   Encounter for Medicare annual wellness exam 1 year Encouraged patient to schedule covid 19 vaccine Given number to schedule mammogram UTD on colonoscopy  Labile hypertension - continue medications- Nadolol 40 mg QD, DASH diet, exercise and monitor at home. Call if greater than 130/80.  -     CBC with Differential/Platelet -     CMP/GFR   Chronic hepatitis C without hepatic coma (HCC) In remission after epclusa -     CMP/GFR  CKD stage 3a Push fluids and limit NSaid's Monitor blood sugars  Hepatic fibrosis Will send labs to GI -send to [facility] liver care fax [phone] Avoid fatty foods -     CMP/GFR  Schizoaffective disorder, bipolar type (HCC) Continue follow up psych Continue medications  Recurrent major depressive disorder, in full remission (HCC) Continue follow up psych Continue medications  Medication management Continued  Mixed hyperlipidemia -continue medications- Rosuvastatin 5 mg,  - check lipids, decrease fatty foods, increase activity.  -     Lipid panel  Bipolar affective disorder, depressed, severe, with psychotic behavior (HCC) Continue follow up psych Continue medications  Vitamin D deficiency At goal at recent check; continue to recommend supplementation for goal of 60-100 Defer vitamin D level  Osteopenia Continue Vit D supplementation and weight bearing exercises - DEXA ordered  Hypothyroidism, unspecified type Hypothyroidism-check TSH level, continue medications the same, reminded to take on an empty stomach 30-43mins before food.  - TSH  Obesity - BMI 31 - long discussion about weight loss, diet, and exercise -recommended diet heavy in fruits and veggies and low in animal meats, cheeses, and dairy products - Decrease saturated fats and simple carbs.  Increase lean proteins and exercise.  -  discussed weight loss goal of <170 lb  Abnormal glucose Recent a1cs well controlled - CMP  Insomnia Insomnia- good sleep hygiene discussed, increase day time activity Cutting down on Trazodone- currently only taking 1/4 of a pill a night and plans to wean off completely  Fungal infection of skin of abdomen - Lotrisone BID x 7-10 days - If does not resolve or worsens notify the office   Continue diet and meds as discussed. Further disposition pending results of labs. Discussed med's effects and SE's.   Over 30 minutes of exam, counseling, chart review, and critical decision making was performed.   Future Appointments  Date Time Provider Department Center  12/11/2023 11:00 AM [name], MD [facility] None  03/18/2024  2:30 PM Raynelle Dick, NP [facility] None    Plan:   During the course of the visit the patient was educated and counseled about appropriate screening and preventive services including:   Pneumococcal vaccine  Influenza vaccine Td vaccine Screening electrocardiogram Bone densitometry screening Colorectal cancer screening Diabetes screening Glaucoma screening Nutrition counseling  Advanced directives: requested   Subjective:   Rebekah Pace, 67 y.o. DWF,  presents for Medicare Annual Wellness Visit and follow up  for follow up with Hypertension, Hyperlipidemia, Pre-Diabetes, Hypothyroidism  and Vitamin D Deficiency.   She has been following with Dr. Percell Locus. Luanna Salk at liver care center for hepatitis C, treated with epclusa and in remission since. She has NASH/hepatic fibrosis and is following with them.   In the spring of 2013 she had a psychotic break, has had several psych admissions, Dx'd Bipolar Manic Depressive. Currently she is  followed by [facility] by a staff therapist & Psychiatrist to manage her psychotropic medications. She is on Abilify and reports she typically well with current regimen. She is taking  trazodone 50 mg 1/4 of a pill at night for sleep as needed- plans to discontinue.    Patient has hx/o labile HTN circa 2005, she takes Nadolol 40 mg for tremors and BP has noticed an improvement in tremors.  and BP has been controlled and today's BP: 118/72 BP Readings from Last 3 Encounters:  03/19/23 118/72  12/04/22 128/74  07/31/22 138/82  She is walking daily, 30 mins 5 days a week and denies any cardiac type chest pain, palpitations, dyspnea/orthopnea/PND, dizziness, claudication, or dependent edema.   She has a reddened circular skin lesion that is not itchy or painful and has been present for approximately 6 months.  Does not remember injuring the area.   BMI is Body mass index is 31.79 kg/m., she has been working on diet and exercise, was formerly walking but has stopped while caring for mother, hasn't restarted.  She denies alcohol intake, mainly drinks water,  Wt Readings from Last 3 Encounters:  03/19/23 185 lb 3.2 oz (84 kg)  12/04/22 190 lb (86.2 kg)  07/31/22 188 lb 12.8 oz (85.6 kg)    She has started the crestor (5 mg daily), she is working hard on diet/exercies. Patient denies myalgias or other med SE's. Last LDL was at goal with:  Lab Results  Component Value Date   CHOL 117 12/04/2022   HDL 42 (L) 12/04/2022   LDLCALC 52 12/04/2022   TRIG 157 (H) 12/04/2022   CHOLHDL 2.8 12/04/2022   Also, the patient has history of Insulin Resistance with A1c 5.5% and elevated Insulin level 35 in Jan 2014 and has had no symptoms of reactive hypoglycemia, diabetic polys, paresthesias or visual blurring.  Recent A1C was in normal range:  Lab Results  Component Value Date   HGBA1C 5.4 12/04/2022    She has CKD III monitored at this office:  Lab Results  Component Value Date   EGFR 59 (L) 12/04/2022    Patient has been on thyroid replacement circa 2005 and remained in therapeutic range on routine monitoring. Takes 75 mcg daily without recent dose change.  Lab Results   Component Value Date   TSH 1.43 12/04/2022   Further, the patient also has history of Vitamin D Deficiency and is on vitamin D supplement, taking 16109 IU daily  Lab Results  Component Value Date   VD25OH 87 12/04/2022     Medication Review: Current Outpatient Medications on File Prior to Visit  Medication Sig   ARIPiprazole (ABILIFY) 10 MG tablet Take 10 mg by mouth daily.   Ascorbic Acid (VITAMIN C PO) Take by mouth.   levothyroxine (SYNTHROID) 75 MCG tablet Take  1 tablet  Daily  on an empty stomach with only water for 30 minutes & no Antacid meds, Calcium or Magnesium for 4 hours & avoid Biotin                                                   /  TAKE                                      BY                     MOUTH   MAGNESIUM PO Take by mouth.   Multiple Vitamin (MULTIVITAMIN PO) Take by mouth.   nadolol (CORGARD) 40 MG tablet Take  1 tablet every Morning for BP & Familial Tremor   rosuvastatin (CRESTOR) 5 MG tablet Take  1 tablet  Daily  on an empty stomach with only water for 30 minutes & no Antacid meds, Calcium or Magnesium for 4 hours & avoid Biotin                                                                         /                                                          TAKE                                                      BY                                                   MOUTH   traZODone (DESYREL) 50 MG tablet 1/2-1 tablet for sleep   VITAMIN D, CHOLECALCIFEROL, PO Take 5,000 Units by mouth 2 (two) times daily.    Zinc 50 MG TABS Take by mouth.   No current facility-administered medications on file prior to visit.    Current Problems (verified) Patient Active Problem List   Diagnosis Date Noted   Coagulation defect (HCC) 12/04/2022   Chronic cystitis 05/26/2021   Aortic atherosclerosis (HCC) per CT 06/2020 02/16/2021   Stage 3a chronic kidney disease (HCC) 03/02/2020   Abnormal glucose 11/14/2019   Hematuria, microscopic  07/31/2018   Other cirrhosis of liver (HCC) 11/23/2016   Gout 05/17/2016   History of hepatitis C, s/p curative treatment 01/31/2016   Obesity (BMI 30.0-34.9) 07/24/2015   Hypothyroidism 12/13/2014   Hyperlipidemia, mixed 09/01/2014   Vitamin D deficiency 09/01/2014   Bipolar affective disorder, depressed, severe, with psychotic behavior (HCC) 09/01/2014   Medication management 03/30/2014   Labile hypertension    Schizoaffective disorder, bipolar type (HCC) 09/12/2011     Immunization History  Administered Date(s) Administered   Influenza Inj Mdck Quad With Preservative 08/02/2019   PFIZER(Purple Top)SARS-COV-2 Vaccination 05/05/2020, 05/05/2020, 05/26/2020   PNEUMOCOCCAL CONJUGATE-20 03/06/2022   Pneumococcal-Unspecified 10/14/2001   Td 10/15/1995   Health Maintenance  Topic Date Due   COVID-19 Vaccine (4 - 2023-24 season) 04/04/2023 (Originally  06/14/2022)   Zoster Vaccines- Shingrix (1 of 2) 06/19/2023 (Originally 05/21/1974)   MAMMOGRAM  07/19/2023 (Originally 09/20/2021)   Fecal DNA (Cologuard)  05/24/2024 (Originally 06/24/2020)   INFLUENZA VACCINE  05/15/2023   Medicare Annual Wellness (AWV)  03/18/2024   Pneumonia Vaccine 51+ Years old  Completed   DEXA SCAN  Completed   Hepatitis C Screening  Completed   HPV VACCINES  Aged Out   DTaP/Tdap/Td  Discontinued      Names of Other Physician/Practitioners you currently use: 1. [facility] here for primary care 2. Dr Emily Filbert, OD, eye doctor, appt tomorrow 3. Dr. Jearl Klinefelter, dentist, last visit 10/2021  Patient Care Team: [name], MD as PCP - General (Internal Medicine) Elinor Parkinson, DPM as Consulting Physician (Podiatry) Louis Meckel, MD (Inactive) as Consulting Physician (Gastroenterology) Billie Ruddy, OD as Referring Physician (Optometry) Annamarie Major, CRNP as Nurse Practitioner (Nurse Practitioner) Zamor, Harlan Stains, MD as Consulting Physician  (Gastroenterology) Annamarie Major, CRNP as Nurse Practitioner (Gastroenterology)  Allergies Allergies  Allergen Reactions   Sulfa Antibiotics Rash    SURGICAL HISTORY She  has a past surgical history that includes arm fracture; Rotator cuff repair; and Colonoscopy (20 + years ago ). FAMILY HISTORY Her family history includes Cancer in her father; Colon cancer (age of onset: 42) in her mother; Colon polyps in her mother; Hyperlipidemia in her brother and mother; Hypertension in her mother. SOCIAL HISTORY She  reports that she has never smoked. She has never used smokeless tobacco. She reports that she does not drink alcohol and does not use drugs.  MEDICARE WELLNESS OBJECTIVES: Physical activity: Current Exercise Habits: Home exercise routine, Type of exercise: walking, Time (Minutes): 30, Frequency (Times/Week): 5, Weekly Exercise (Minutes/Week): 150, Intensity: Mild, Exercise limited by: None identified Cardiac risk factors: Cardiac Risk Factors include: advanced age (>23men, >58 women);dyslipidemia;hypertension;sedentary lifestyle;obesity (BMI >30kg/m2) Depression/mood screen:      03/19/2023    3:01 PM  Depression screen PHQ 2/9  Decreased Interest 0  Down, Depressed, Hopeless 0  PHQ - 2 Score 0    ADLs:     03/19/2023    3:02 PM 06/10/2022   11:45 PM  In your present state of health, do you have any difficulty performing the following activities:  Hearing? 0 0  Vision? 0 0  Difficulty concentrating or making decisions? 0 0  Walking or climbing stairs? 0 0  Dressing or bathing? 0 0  Doing errands, shopping? 0 0    Cognitive Testing  Alert? Yes  Normal Appearance?Yes  Oriented to person? Yes  Place? Yes   Time? Yes  Recall of three objects?  Yes  Can perform simple calculations? Yes  Displays appropriate judgment?Yes  Can read the correct time from a watch face?Yes  EOL planning: Does Patient Have a Medical Advance Directive?: Yes Type of Advance Directive: Healthcare  Power of Attorney, Living will Does patient want to make changes to medical advance directive?: No - Patient declined Copy of Healthcare Power of Attorney in Chart?: No - copy requested   Objective:     BP 118/72   Pulse (!) 43   Temp 97.7 F (36.5 C)   Ht 5\' 4"  (1.626 m)   Wt 185 lb 3.2 oz (84 kg)   SpO2 94%   BMI 31.79 kg/m   General Appearance: Well nourished, alert, WD/WN, female and in no apparent distress. Eyes: PERRLA, EOMs, conjunctiva no swelling or erythema, normal fundi and vessels. Sinuses: No frontal/maxillary tenderness  ENT/Mouth: EACs patent / TMs  nl. Nares clear without erythema, swelling, mucoid exudates. Oral hygiene is good. No erythema, swelling, or exudate. Marland Kitchen Hearing normal.  Neck: Supple, thyroid normal. No bruits, nodes or JVD. Respiratory: Respiratory effort normal.  BS equal and clear bilateral without rales, rhonci, wheezing or stridor. Cardio: Heart sounds are normal with regular rate and rhythm and no murmurs, rubs or gallops. Peripheral pulses are normal and equal bilaterally without edema.  Chest: symmetric with normal excursions and percussion. Abdomen: Flat, soft  with nl bowel sounds.  Lymphatics: Non tender without lymphadenopathy.  Musculoskeletal: Full ROM all peripheral extremities, joint stability, 5/5 strength, and normal gait. Skin: Warm and dry . Approx 3 cm circular flat erythematous area on left mid abdomen Neuro: Cranial nerves intact, reflexes equal bilaterally. Normal muscle tone, no cerebellar symptoms. Sensation intact.  Pysch: Alert and oriented X 3, normal affect, Insight and Judgment appropriate.   Medicare Attestation I have personally reviewed: The patient's medical and social history Their use of alcohol, tobacco or illicit drugs Their current medications and supplements The patient's functional ability including ADLs,fall risks, home safety risks, cognitive, and hearing and visual impairment Diet and physical  activities Evidence for depression or mood disorders  The patient's weight, height, BMI, and visual acuity have been recorded in the chart.  I have made referrals, counseling, and provided education to the patient based on review of the above and I have provided the patient with a written personalized care plan for preventive services.  Over 40 minutes of exam, counseling, chart review was performed.  Raynelle Dick, NP   03/19/2023

## 2023-03-19 ENCOUNTER — Ambulatory Visit (INDEPENDENT_AMBULATORY_CARE_PROVIDER_SITE_OTHER): Payer: Medicare Other | Admitting: Nurse Practitioner

## 2023-03-19 ENCOUNTER — Encounter: Payer: Self-pay | Admitting: Nurse Practitioner

## 2023-03-19 VITALS — BP 118/72 | HR 43 | Temp 97.7°F | Ht 64.0 in | Wt 185.2 lb

## 2023-03-19 DIAGNOSIS — B182 Chronic viral hepatitis C: Secondary | ICD-10-CM

## 2023-03-19 DIAGNOSIS — F250 Schizoaffective disorder, bipolar type: Secondary | ICD-10-CM

## 2023-03-19 DIAGNOSIS — M8580 Other specified disorders of bone density and structure, unspecified site: Secondary | ICD-10-CM

## 2023-03-19 DIAGNOSIS — F3340 Major depressive disorder, recurrent, in remission, unspecified: Secondary | ICD-10-CM

## 2023-03-19 DIAGNOSIS — N1831 Chronic kidney disease, stage 3a: Secondary | ICD-10-CM

## 2023-03-19 DIAGNOSIS — Z0001 Encounter for general adult medical examination with abnormal findings: Secondary | ICD-10-CM | POA: Diagnosis not present

## 2023-03-19 DIAGNOSIS — E039 Hypothyroidism, unspecified: Secondary | ICD-10-CM

## 2023-03-19 DIAGNOSIS — Z79899 Other long term (current) drug therapy: Secondary | ICD-10-CM

## 2023-03-19 DIAGNOSIS — B369 Superficial mycosis, unspecified: Secondary | ICD-10-CM

## 2023-03-19 DIAGNOSIS — E559 Vitamin D deficiency, unspecified: Secondary | ICD-10-CM

## 2023-03-19 DIAGNOSIS — I700 Atherosclerosis of aorta: Secondary | ICD-10-CM

## 2023-03-19 DIAGNOSIS — F315 Bipolar disorder, current episode depressed, severe, with psychotic features: Secondary | ICD-10-CM

## 2023-03-19 DIAGNOSIS — R7309 Other abnormal glucose: Secondary | ICD-10-CM

## 2023-03-19 DIAGNOSIS — E782 Mixed hyperlipidemia: Secondary | ICD-10-CM | POA: Diagnosis not present

## 2023-03-19 DIAGNOSIS — R6889 Other general symptoms and signs: Secondary | ICD-10-CM | POA: Diagnosis not present

## 2023-03-19 DIAGNOSIS — Z780 Asymptomatic menopausal state: Secondary | ICD-10-CM

## 2023-03-19 DIAGNOSIS — E669 Obesity, unspecified: Secondary | ICD-10-CM

## 2023-03-19 DIAGNOSIS — F5101 Primary insomnia: Secondary | ICD-10-CM

## 2023-03-19 DIAGNOSIS — I10 Essential (primary) hypertension: Secondary | ICD-10-CM

## 2023-03-19 DIAGNOSIS — K7400 Hepatic fibrosis, unspecified: Secondary | ICD-10-CM

## 2023-03-19 MED ORDER — CLOTRIMAZOLE-BETAMETHASONE 1-0.05 % EX CREA
1.0000 | TOPICAL_CREAM | Freq: Two times a day (BID) | CUTANEOUS | 0 refills | Status: DC
Start: 2023-03-19 — End: 2023-09-29

## 2023-03-19 NOTE — Patient Instructions (Signed)

## 2023-03-20 LAB — CBC WITH DIFFERENTIAL/PLATELET
Absolute Monocytes: 520 cells/uL (ref 200–950)
Basophils Absolute: 59 cells/uL (ref 0–200)
Basophils Relative: 0.9 %
Eosinophils Absolute: 182 cells/uL (ref 15–500)
Eosinophils Relative: 2.8 %
HCT: 42.5 % (ref 35.0–45.0)
Hemoglobin: 14.2 g/dL (ref 11.7–15.5)
Lymphs Abs: 1684 cells/uL (ref 850–3900)
MCH: 29.3 pg (ref 27.0–33.0)
MCHC: 33.4 g/dL (ref 32.0–36.0)
MCV: 87.8 fL (ref 80.0–100.0)
MPV: 10.3 fL (ref 7.5–12.5)
Monocytes Relative: 8 %
Neutro Abs: 4056 cells/uL (ref 1500–7800)
Neutrophils Relative %: 62.4 %
Platelets: 281 10*3/uL (ref 140–400)
RBC: 4.84 10*6/uL (ref 3.80–5.10)
RDW: 12.1 % (ref 11.0–15.0)
Total Lymphocyte: 25.9 %
WBC: 6.5 10*3/uL (ref 3.8–10.8)

## 2023-03-20 LAB — LIPID PANEL
Cholesterol: 118 mg/dL (ref <200)
HDL: 40 mg/dL — ABNORMAL LOW (ref >=50)
LDL Cholesterol (Calc): 52 mg/dL (calc)
Non-HDL Cholesterol (Calc): 78 mg/dL (calc) (ref <130)
Total CHOL/HDL Ratio: 3 (calc) (ref <5.0)
Triglycerides: 181 mg/dL — ABNORMAL HIGH (ref <150)

## 2023-03-20 LAB — COMPLETE METABOLIC PANEL WITH GFR
AG Ratio: 2 (calc) (ref 1.0–2.5)
ALT: 14 U/L (ref 6–29)
AST: 19 U/L (ref 10–35)
Albumin: 4.7 g/dL (ref 3.6–5.1)
Alkaline phosphatase (APISO): 80 U/L (ref 37–153)
BUN: 15 mg/dL (ref 7–25)
CO2: 26 mmol/L (ref 20–32)
Calcium: 10 mg/dL (ref 8.6–10.4)
Chloride: 104 mmol/L (ref 98–110)
Creat: 0.93 mg/dL (ref 0.50–1.05)
Globulin: 2.4 g/dL (calc) (ref 1.9–3.7)
Glucose, Bld: 88 mg/dL (ref 65–99)
Potassium: 4.7 mmol/L (ref 3.5–5.3)
Sodium: 140 mmol/L (ref 135–146)
Total Bilirubin: 0.6 mg/dL (ref 0.2–1.2)
Total Protein: 7.1 g/dL (ref 6.1–8.1)
eGFR: 67 mL/min/{1.73_m2} (ref >=60)

## 2023-03-20 LAB — TSH: TSH: 0.8 mIU/L (ref 0.40–4.50)

## 2023-03-20 NOTE — Progress Notes (Signed)
Patient is aware of lab results and instructions. -e welch

## 2023-04-14 DIAGNOSIS — F5101 Primary insomnia: Secondary | ICD-10-CM | POA: Diagnosis not present

## 2023-04-14 DIAGNOSIS — F250 Schizoaffective disorder, bipolar type: Secondary | ICD-10-CM | POA: Diagnosis not present

## 2023-04-14 DIAGNOSIS — R251 Tremor, unspecified: Secondary | ICD-10-CM | POA: Diagnosis not present

## 2023-05-28 ENCOUNTER — Other Ambulatory Visit: Payer: Self-pay | Admitting: Nurse Practitioner

## 2023-05-28 DIAGNOSIS — Z1231 Encounter for screening mammogram for malignant neoplasm of breast: Secondary | ICD-10-CM

## 2023-05-28 DIAGNOSIS — Z780 Asymptomatic menopausal state: Secondary | ICD-10-CM

## 2023-06-12 ENCOUNTER — Ambulatory Visit
Admission: RE | Admit: 2023-06-12 | Discharge: 2023-06-12 | Disposition: A | Discharge status: Home / Self Care | Payer: Medicare Other | Source: Ambulatory Visit | Attending: Nurse Practitioner | Admitting: Nurse Practitioner

## 2023-06-12 DIAGNOSIS — Z1231 Encounter for screening mammogram for malignant neoplasm of breast: Secondary | ICD-10-CM | POA: Diagnosis not present

## 2023-06-13 ENCOUNTER — Ambulatory Visit
Admission: RE | Admit: 2023-06-13 | Discharge: 2023-06-13 | Disposition: A | Discharge status: Home / Self Care | Payer: Medicare Other | Source: Ambulatory Visit | Attending: Nurse Practitioner | Admitting: Nurse Practitioner

## 2023-06-13 DIAGNOSIS — N958 Other specified menopausal and perimenopausal disorders: Secondary | ICD-10-CM | POA: Diagnosis not present

## 2023-06-13 DIAGNOSIS — Z780 Asymptomatic menopausal state: Secondary | ICD-10-CM

## 2023-06-13 DIAGNOSIS — E2839 Other primary ovarian failure: Secondary | ICD-10-CM | POA: Diagnosis not present

## 2023-06-13 DIAGNOSIS — M8588 Other specified disorders of bone density and structure, other site: Secondary | ICD-10-CM | POA: Diagnosis not present

## 2023-06-14 ENCOUNTER — Other Ambulatory Visit: Payer: Self-pay | Admitting: Internal Medicine

## 2023-06-14 MED ORDER — AZITHROMYCIN 250 MG PO TABS: ORAL_TABLET | ORAL | 1 refills | Status: DC

## 2023-06-14 MED ORDER — DEXAMETHASONE 4 MG PO TABS: ORAL_TABLET | ORAL | 0 refills | Status: DC

## 2023-06-14 MED ORDER — PROMETHAZINE HCL 6.25 MG/5ML PO SOLN: ORAL | 3 refills | Status: DC

## 2023-06-17 NOTE — Progress Notes (Signed)
No just continue to do walking and exercise

## 2023-07-07 DIAGNOSIS — F250 Schizoaffective disorder, bipolar type: Secondary | ICD-10-CM | POA: Diagnosis not present

## 2023-07-07 DIAGNOSIS — R251 Tremor, unspecified: Secondary | ICD-10-CM | POA: Diagnosis not present

## 2023-07-07 DIAGNOSIS — F5101 Primary insomnia: Secondary | ICD-10-CM | POA: Diagnosis not present

## 2023-08-04 ENCOUNTER — Other Ambulatory Visit: Payer: Self-pay | Admitting: Nurse Practitioner

## 2023-08-04 DIAGNOSIS — K7402 Hepatic fibrosis, advanced fibrosis: Secondary | ICD-10-CM | POA: Diagnosis not present

## 2023-08-04 DIAGNOSIS — K760 Fatty (change of) liver, not elsewhere classified: Secondary | ICD-10-CM | POA: Diagnosis not present

## 2023-08-04 DIAGNOSIS — K7581 Nonalcoholic steatohepatitis (NASH): Secondary | ICD-10-CM | POA: Diagnosis not present

## 2023-08-07 ENCOUNTER — Ambulatory Visit
Admission: RE | Admit: 2023-08-07 | Discharge: 2023-08-07 | Disposition: A | Discharge status: Home / Self Care | Payer: Medicare Other | Source: Ambulatory Visit | Attending: Nurse Practitioner | Admitting: Nurse Practitioner

## 2023-08-07 DIAGNOSIS — K7400 Hepatic fibrosis, unspecified: Secondary | ICD-10-CM | POA: Diagnosis not present

## 2023-08-07 DIAGNOSIS — K7402 Hepatic fibrosis, advanced fibrosis: Secondary | ICD-10-CM

## 2023-08-18 NOTE — Progress Notes (Unsigned)
Patient ID: Rebekah Pace, female   DOB: [birth year], 67 y.o.   MRN: [id number]  Follow up  Assessment:    Labile hypertension - continue medications- Nadolol 40 mg QD, DASH diet, exercise and monitor at home. Call if greater than 130/80.  -     CBC with Differential/Platelet -     CMP/GFR   Chronic hepatitis C without hepatic coma (HCC) In remission after epclusa -     CMP/GFR  CKD stage 3a Push fluids and limit NSaid's Monitor blood sugars  Hepatic fibrosis Will send labs to GI -send to [facility] liver care fax [phone] Avoid fatty foods -     CMP/GFR  Schizoaffective disorder, bipolar type (HCC) Continue follow up psych Continue medications  Recurrent major depressive disorder, in full remission (HCC) Continue follow up psych Continue medications  Medication management Continued  Mixed hyperlipidemia -continue medications- Rosuvastatin 5 mg,  - check lipids, decrease fatty foods, increase activity.  -     Lipid panel  Bipolar affective disorder, depressed, severe, with psychotic behavior (HCC) Continue follow up psych Continue medications  Vitamin D deficiency At goal at recent check; continue to recommend supplementation for goal of 60-100 Defer vitamin D level   Hypothyroidism, unspecified type Hypothyroidism-check TSH level, continue medications the same, reminded to take on an empty stomach 30-57mins before food.  - TSH  Obesity - BMI 31 - long discussion about weight loss, diet, and exercise -recommended diet heavy in fruits and veggies and low in animal meats, cheeses, and dairy products - Decrease saturated fats and simple carbs.  Increase lean proteins and exercise.  - discussed weight loss goal of <170 lb  Abnormal glucose Recent a1cs well controlled - CMP    Continue diet and meds as discussed. Further disposition pending results of labs. Discussed med's effects and SE's.   Over 30 minutes of exam, counseling, chart review, and critical  decision making was performed.   Future Appointments  Date Time Provider Department Center  08/19/2023 11:00 AM Raynelle Dick, NP [facility] None  12/11/2023 11:00 AM [name], MD [facility] None  03/18/2024  2:30 PM Raynelle Dick, NP [facility] None       Subjective:   Rebekah Pace, 68 y.o. DWF,  presents for follow up  for follow up with Hypertension, Hyperlipidemia, Pre-Diabetes, Hypothyroidism  and Vitamin D Deficiency.   She has been following with Dr. Percell Locus. Luanna Salk at liver care center for hepatitis C, treated with epclusa and in remission since. She has NASH/hepatic fibrosis and is following with them.   In the spring of 2013 she had a psychotic break, has had several psych admissions, Dx'd Bipolar Manic Depressive. Currently she is followed by [facility] by a staff therapist & Psychiatrist to manage her psychotropic medications. She is on Abilify and reports she typically well with current regimen. She is taking trazodone 50 mg 1/4 of a pill at night for sleep as needed- plans to discontinue.    Patient has hx/o labile HTN circa 2005, she takes Nadolol 40 mg for tremors and BP has noticed an improvement in tremors.  and BP has been controlled and today's   BP Readings from Last 3 Encounters:  03/19/23 118/72  12/04/22 128/74  07/31/22 138/82  She is walking daily, 30 mins 5 days a week and denies any cardiac type chest pain, palpitations, dyspnea/orthopnea/PND, dizziness, claudication, or dependent edema.   She has a reddened circular skin lesion that  is not itchy or painful and has been present for approximately 6 months.  Does not remember injuring the area.   BMI is There is no height or weight on file to calculate BMI., she has been working on diet and exercise, was formerly walking but has stopped while caring for mother, hasn't restarted.  She denies alcohol intake, mainly drinks water,  Wt Readings from Last 3 Encounters:   03/19/23 185 lb 3.2 oz (84 kg)  12/04/22 190 lb (86.2 kg)  07/31/22 188 lb 12.8 oz (85.6 kg)    She has started the crestor (5 mg daily), she is working hard on diet/exercies. Patient denies myalgias or other med SE's. Last LDL was at goal with:  Lab Results  Component Value Date   CHOL 118 03/19/2023   HDL 40 (L) 03/19/2023   LDLCALC 52 03/19/2023   TRIG 181 (H) 03/19/2023   CHOLHDL 3.0 03/19/2023   Also, the patient has history of Insulin Resistance with A1c 5.5% and elevated Insulin level 35 in Jan 2014 and has had no symptoms of reactive hypoglycemia, diabetic polys, paresthesias or visual blurring.  Recent A1C was in normal range:  Lab Results  Component Value Date   HGBA1C 5.4 12/04/2022    She has CKD III monitored at this office:  Lab Results  Component Value Date   EGFR 67 03/19/2023    Patient has been on thyroid replacement circa 2005 and remained in therapeutic range on routine monitoring. Takes 75 mcg daily without recent dose change.  Lab Results  Component Value Date   TSH 0.80 03/19/2023   Further, the patient also has history of Vitamin D Deficiency and is on vitamin D supplement, taking 16109 IU daily  Lab Results  Component Value Date   VD25OH 87 12/04/2022     Medication Review: Current Outpatient Medications on File Prior to Visit  Medication Sig   ARIPiprazole (ABILIFY) 10 MG tablet Take 10 mg by mouth daily.   Ascorbic Acid (VITAMIN C PO) Take by mouth.   azithromycin (ZITHROMAX) 250 MG tablet Take 2 tablets with Food on  Day 1, then 1 tablet Daily with Food for Sinusitis / Bronchitis   clotrimazole-betamethasone (LOTRISONE) cream Apply 1 Application topically 2 (two) times daily.   dexamethasone (DECADRON) 4 MG tablet Take 1 tab 3 x day - 3 days, then 2 x day - 3 days, then 1 tab daily   levothyroxine (SYNTHROID) 75 MCG tablet Take  1 tablet  Daily  on an empty stomach with only water for 30 minutes & no Antacid meds, Calcium or Magnesium for 4  hours & avoid Biotin                                                   /                                   TAKE                                      BY                     MOUTH   MAGNESIUM PO  Take by mouth.   Multiple Vitamin (MULTIVITAMIN PO) Take by mouth.   nadolol (CORGARD) 40 MG tablet Take  1 tablet every Morning for BP & Familial Tremor   promethazine (PHENERGAN) 6.25 MG/5ML solution Use 1 to 2 teaspoons gargle & swallow every 4 hours as needed for pain   rosuvastatin (CRESTOR) 5 MG tablet Take  1 tablet  Daily  on an empty stomach with only water for 30 minutes & no Antacid meds, Calcium or Magnesium for 4 hours & avoid Biotin                                                                         /                                                          TAKE                                                      BY                                                   MOUTH   traZODone (DESYREL) 50 MG tablet 1/2-1 tablet for sleep   VITAMIN D, CHOLECALCIFEROL, PO Take 5,000 Units by mouth 2 (two) times daily.    Zinc 50 MG TABS Take by mouth.   No current facility-administered medications on file prior to visit.    Current Problems (verified) Patient Active Problem List   Diagnosis Date Noted   Coagulation defect (HCC) 12/04/2022   Chronic cystitis 05/26/2021   Aortic atherosclerosis (HCC) per CT 06/2020 02/16/2021   Stage 3a chronic kidney disease (HCC) 03/02/2020   Abnormal glucose 11/14/2019   Hematuria, microscopic 07/31/2018   Other cirrhosis of liver (HCC) 11/23/2016   Gout 05/17/2016   History of hepatitis C, s/p curative treatment 01/31/2016   Obesity (BMI 30.0-34.9) 07/24/2015   Hypothyroidism 12/13/2014   Hyperlipidemia, mixed 09/01/2014   Vitamin D deficiency 09/01/2014   Bipolar affective disorder, depressed, severe, with psychotic behavior (HCC) 09/01/2014   Medication management 03/30/2014   Labile hypertension    Schizoaffective disorder, bipolar type (HCC)  09/12/2011     Immunization History  Administered Date(s) Administered   Influenza Inj Mdck Quad With Preservative 08/02/2019   PFIZER(Purple Top)SARS-COV-2 Vaccination 05/05/2020, 05/05/2020, 05/26/2020   PNEUMOCOCCAL CONJUGATE-20 03/06/2022   Pneumococcal-Unspecified 10/14/2001   Td 10/15/1995   Health Maintenance  Topic Date Due   Zoster Vaccines- Shingrix (1 of 2) Never done   INFLUENZA VACCINE  05/15/2023   COVID-19 Vaccine (4 - 2023-24 season) 06/15/2023   Fecal DNA (Cologuard)  05/24/2024 (Originally 06/24/2020)   Medicare Annual Wellness (AWV)  03/18/2024   MAMMOGRAM  06/11/2024   Pneumonia Vaccine 65+  Years old  Completed   DEXA SCAN  Completed   Hepatitis C Screening  Completed   HPV VACCINES  Aged Out   DTaP/Tdap/Td  Discontinued      Names of Other Physician/Practitioners you currently use: 1. [facility] here for primary care 2. Dr Emily Filbert, OD, eye doctor, appt tomorrow 3. Dr. Jearl Klinefelter, dentist, last visit 10/2021  Patient Care Team: [name], MD as PCP - General (Internal Medicine) Elinor Parkinson, DPM as Consulting Physician (Podiatry) Louis Meckel, MD (Inactive) as Consulting Physician (Gastroenterology) Billie Ruddy, OD as Referring Physician (Optometry) Annamarie Major, CRNP as Nurse Practitioner (Nurse Practitioner) Zamor, Harlan Stains, MD as Consulting Physician (Gastroenterology) Annamarie Major, CRNP as Nurse Practitioner (Gastroenterology)  Allergies Allergies  Allergen Reactions   Sulfa Antibiotics Rash    SURGICAL HISTORY She  has a past surgical history that includes arm fracture; Rotator cuff repair; and Colonoscopy (20 + years ago ). FAMILY HISTORY Her family history includes Cancer in her father; Colon cancer (age of onset: 82) in her mother; Colon polyps in her mother; Hyperlipidemia in her brother and mother; Hypertension in her mother. SOCIAL HISTORY She  reports that she has never smoked.  She has never used smokeless tobacco. She reports that she does not drink alcohol and does not use drugs.     Objective:     There were no vitals taken for this visit.  General Appearance: Well nourished, alert, WD/WN, female and in no apparent distress. Eyes: PERRLA, EOMs, conjunctiva no swelling or erythema, normal fundi and vessels. Sinuses: No frontal/maxillary tenderness ENT/Mouth: EACs patent / TMs  nl. Nares clear without erythema, swelling, mucoid exudates. Oral hygiene is good. No erythema, swelling, or exudate. Marland Kitchen Hearing normal.  Neck: Supple, thyroid normal. No bruits, nodes or JVD. Respiratory: Respiratory effort normal.  BS equal and clear bilateral without rales, rhonci, wheezing or stridor. Cardio: Heart sounds are normal with regular rate and rhythm and no murmurs, rubs or gallops. Peripheral pulses are normal and equal bilaterally without edema.  Chest: symmetric with normal excursions and percussion. Abdomen: Flat, soft  with nl bowel sounds.  Lymphatics: Non tender without lymphadenopathy.  Musculoskeletal: Full ROM all peripheral extremities, joint stability, 5/5 strength, and normal gait. Skin: Warm and dry . Approx 3 cm circular flat erythematous area on left mid abdomen Neuro: Cranial nerves intact, reflexes equal bilaterally. Normal muscle tone, no cerebellar symptoms. Sensation intact.  Pysch: Alert and oriented X 3, normal affect, Insight and Judgment appropriate.     Raynelle Dick, NP   08/18/2023

## 2023-08-19 ENCOUNTER — Encounter: Payer: Self-pay | Admitting: Nurse Practitioner

## 2023-08-19 ENCOUNTER — Ambulatory Visit (INDEPENDENT_AMBULATORY_CARE_PROVIDER_SITE_OTHER): Payer: Medicare Other | Admitting: Nurse Practitioner

## 2023-08-19 VITALS — BP 122/80 | HR 67 | Temp 97.5°F | Ht 64.0 in | Wt 174.0 lb

## 2023-08-19 DIAGNOSIS — E039 Hypothyroidism, unspecified: Secondary | ICD-10-CM | POA: Diagnosis not present

## 2023-08-19 DIAGNOSIS — I700 Atherosclerosis of aorta: Secondary | ICD-10-CM | POA: Diagnosis not present

## 2023-08-19 DIAGNOSIS — N182 Chronic kidney disease, stage 2 (mild): Secondary | ICD-10-CM

## 2023-08-19 DIAGNOSIS — Z23 Encounter for immunization: Secondary | ICD-10-CM | POA: Diagnosis not present

## 2023-08-19 DIAGNOSIS — B182 Chronic viral hepatitis C: Secondary | ICD-10-CM | POA: Diagnosis not present

## 2023-08-19 DIAGNOSIS — M810 Age-related osteoporosis without current pathological fracture: Secondary | ICD-10-CM

## 2023-08-19 DIAGNOSIS — N1831 Chronic kidney disease, stage 3a: Secondary | ICD-10-CM

## 2023-08-19 DIAGNOSIS — E559 Vitamin D deficiency, unspecified: Secondary | ICD-10-CM

## 2023-08-19 DIAGNOSIS — F250 Schizoaffective disorder, bipolar type: Secondary | ICD-10-CM | POA: Diagnosis not present

## 2023-08-19 DIAGNOSIS — K7400 Hepatic fibrosis, unspecified: Secondary | ICD-10-CM

## 2023-08-19 DIAGNOSIS — E66811 Obesity, class 1: Secondary | ICD-10-CM

## 2023-08-19 DIAGNOSIS — R7309 Other abnormal glucose: Secondary | ICD-10-CM

## 2023-08-19 DIAGNOSIS — E782 Mixed hyperlipidemia: Secondary | ICD-10-CM

## 2023-08-19 DIAGNOSIS — I10 Essential (primary) hypertension: Secondary | ICD-10-CM

## 2023-08-19 DIAGNOSIS — F3340 Major depressive disorder, recurrent, in remission, unspecified: Secondary | ICD-10-CM

## 2023-08-19 DIAGNOSIS — Z79899 Other long term (current) drug therapy: Secondary | ICD-10-CM

## 2023-08-19 DIAGNOSIS — L989 Disorder of the skin and subcutaneous tissue, unspecified: Secondary | ICD-10-CM

## 2023-08-19 DIAGNOSIS — F315 Bipolar disorder, current episode depressed, severe, with psychotic features: Secondary | ICD-10-CM

## 2023-08-19 NOTE — Patient Instructions (Signed)
If blood pressure is consistently running less than 90/50 notify the office and may need to decrease Nadolol dosage.   Hypertension, Adult Hypertension is another name for high blood pressure. High blood pressure forces your heart to work harder to pump blood. This can cause problems over time. There are two numbers in a blood pressure reading. There is a top number (systolic) over a bottom number (diastolic). It is best to have a blood pressure that is below 120/80. What are the causes? The cause of this condition is not known. Some other conditions can lead to high blood pressure. What increases the risk? Some lifestyle factors can make you more likely to develop high blood pressure: Smoking. Not getting enough exercise or physical activity. Being overweight. Having too much fat, sugar, calories, or salt (sodium) in your diet. Drinking too much alcohol. Other risk factors include: Having any of these conditions: Heart disease. Diabetes. High cholesterol. Kidney disease. Obstructive sleep apnea. Having a family history of high blood pressure and high cholesterol. Age. The risk increases with age. Stress. What are the signs or symptoms? High blood pressure may not cause symptoms. Very high blood pressure (hypertensive crisis) may cause: Headache. Fast or uneven heartbeats (palpitations). Shortness of breath. Nosebleed. Vomiting or feeling like you may vomit (nauseous). Changes in how you see. Very bad chest pain. Feeling dizzy. Seizures. How is this treated? This condition is treated by making healthy lifestyle changes, such as: Eating healthy foods. Exercising more. Drinking less alcohol. Your doctor may prescribe medicine if lifestyle changes do not help enough and if: Your top number is above 130. Your bottom number is above 80. Your personal target blood pressure may vary. Follow these instructions at home: Eating and drinking  If told, follow the DASH eating plan.  To follow this plan: Fill one half of your plate at each meal with fruits and vegetables. Fill one fourth of your plate at each meal with whole grains. Whole grains include whole-wheat pasta, brown rice, and whole-grain bread. Eat or drink low-fat dairy products, such as skim milk or low-fat yogurt. Fill one fourth of your plate at each meal with low-fat (lean) proteins. Low-fat proteins include fish, chicken without skin, eggs, beans, and tofu. Avoid fatty meat, cured and processed meat, or chicken with skin. Avoid pre-made or processed food. Limit the amount of salt in your diet to less than 1,500 mg each day. Do not drink alcohol if: Your doctor tells you not to drink. You are pregnant, may be pregnant, or are planning to become pregnant. If you drink alcohol: Limit how much you have to: 0-1 drink a day for women. 0-2 drinks a day for men. Know how much alcohol is in your drink. In the [place] one drink equals one 12 oz bottle of beer (355 mL), one 5 oz glass of wine (148 mL), or one 1 oz glass of hard liquor (44 mL). Lifestyle  Work with your doctor to stay at a healthy weight or to lose weight. Ask your doctor what the best weight is for you. Get at least 30 minutes of exercise that causes your heart to beat faster (aerobic exercise) most days of the week. This may include walking, swimming, or biking. Get at least 30 minutes of exercise that strengthens your muscles (resistance exercise) at least 3 days a week. This may include lifting weights or doing Pilates. Do not smoke or use any products that contain nicotine or tobacco. If you need help quitting, ask your doctor.  Check your blood pressure at home as told by your doctor. Keep all follow-up visits. Medicines Take over-the-counter and prescription medicines only as told by your doctor. Follow directions carefully. Do not skip doses of blood pressure medicine. The medicine does not work as well if you skip doses. Skipping doses also  puts you at risk for problems. Ask your doctor about side effects or reactions to medicines that you should watch for. Contact a doctor if: You think you are having a reaction to the medicine you are taking. You have headaches that keep coming back. You feel dizzy. You have swelling in your ankles. You have trouble with your vision. Get help right away if: You get a very bad headache. You start to feel mixed up (confused). You feel weak or numb. You feel faint. You have very bad pain in your: Chest. Belly (abdomen). You vomit more than once. You have trouble breathing. These symptoms may be an emergency. Get help right away. Call 911. Do not wait to see if the symptoms will go away. Do not drive yourself to the hospital. Summary Hypertension is another name for high blood pressure. High blood pressure forces your heart to work harder to pump blood. For most people, a normal blood pressure is less than 120/80. Making healthy choices can help lower blood pressure. If your blood pressure does not get lower with healthy choices, you may need to take medicine. This information is not intended to replace advice given to you by your health care provider. Make sure you discuss any questions you have with your health care provider. Document Revised: 07/19/2021 Document Reviewed: 07/19/2021 Elsevier Patient Education  2024 [employer].

## 2023-08-20 LAB — COMPLETE METABOLIC PANEL WITH GFR
AG Ratio: 1.6 (calc) (ref 1.0–2.5)
ALT: 14 U/L (ref 6–29)
AST: 21 U/L (ref 10–35)
Albumin: 4.4 g/dL (ref 3.6–5.1)
Alkaline phosphatase (APISO): 80 U/L (ref 37–153)
BUN: 9 mg/dL (ref 7–25)
CO2: 28 mmol/L (ref 20–32)
Calcium: 10.4 mg/dL (ref 8.6–10.4)
Chloride: 100 mmol/L (ref 98–110)
Creat: 0.95 mg/dL (ref 0.50–1.05)
Globulin: 2.8 g/dL (ref 1.9–3.7)
Glucose, Bld: 114 mg/dL — ABNORMAL HIGH (ref 65–99)
Potassium: 5.3 mmol/L (ref 3.5–5.3)
Sodium: 135 mmol/L (ref 135–146)
Total Bilirubin: 0.8 mg/dL (ref 0.2–1.2)
Total Protein: 7.2 g/dL (ref 6.1–8.1)
eGFR: 65 mL/min/{1.73_m2} (ref >=60)

## 2023-08-20 LAB — LIPID PANEL
Cholesterol: 80 mg/dL (ref <200)
HDL: 34 mg/dL — ABNORMAL LOW (ref >=50)
LDL Cholesterol (Calc): 28 mg/dL
Non-HDL Cholesterol (Calc): 46 mg/dL (ref <130)
Total CHOL/HDL Ratio: 2.4 (calc) (ref <5.0)
Triglycerides: 93 mg/dL (ref <150)

## 2023-08-20 LAB — CBC WITH DIFFERENTIAL/PLATELET
Absolute Lymphocytes: 1203 {cells}/uL (ref 850–3900)
Absolute Monocytes: 553 {cells}/uL (ref 200–950)
Basophils Absolute: 68 {cells}/uL (ref 0–200)
Basophils Relative: 1.2 %
Eosinophils Absolute: 143 {cells}/uL (ref 15–500)
Eosinophils Relative: 2.5 %
HCT: 41.7 % (ref 35.0–45.0)
Hemoglobin: 13.8 g/dL (ref 11.7–15.5)
MCH: 29.7 pg (ref 27.0–33.0)
MCHC: 33.1 g/dL (ref 32.0–36.0)
MCV: 89.9 fL (ref 80.0–100.0)
MPV: 10.8 fL (ref 7.5–12.5)
Monocytes Relative: 9.7 %
Neutro Abs: 3734 {cells}/uL (ref 1500–7800)
Neutrophils Relative %: 65.5 %
Platelets: 247 10*3/uL (ref 140–400)
RBC: 4.64 10*6/uL (ref 3.80–5.10)
RDW: 12.2 % (ref 11.0–15.0)
Total Lymphocyte: 21.1 %
WBC: 5.7 10*3/uL (ref 3.8–10.8)

## 2023-08-20 LAB — TSH: TSH: 2.05 m[IU]/L (ref 0.40–4.50)

## 2023-08-27 ENCOUNTER — Telehealth: Payer: Self-pay | Admitting: Nurse Practitioner

## 2023-08-27 NOTE — Telephone Encounter (Signed)
Patient called yesterday about her dermatology referral to Dr. Alinda Sierras. She said no one has reached out to her, so I gave her the number and she called them. The office told her that they did not have the referral. Patient is not super happy with this. She is wondering if you would rather refer her somewhere else or re-send the referral. She knows the referral was sent on 08/19/23 at 3:55 PM.

## 2023-08-27 NOTE — Telephone Encounter (Signed)
Can you check on this?

## 2023-09-04 NOTE — Telephone Encounter (Signed)
Spoke to patient, she is aware of appointment with derm.

## 2023-09-26 ENCOUNTER — Telehealth: Payer: Self-pay | Admitting: Internal Medicine

## 2023-09-26 NOTE — Telephone Encounter (Signed)
Patient called in with low blood pressure.. Ex. 95/58, 16/10,96/04. We scheduled an appointment for 12/13 for this to be discussed. Advised her to keep an eye on BP and if it gets in the 80s/40s, decrease BP medication to half. Told her to also stay hydrated. Patient did not need nurse to call back.

## 2023-09-29 ENCOUNTER — Ambulatory Visit (INDEPENDENT_AMBULATORY_CARE_PROVIDER_SITE_OTHER): Payer: Medicare Other | Admitting: Nurse Practitioner

## 2023-09-29 ENCOUNTER — Encounter: Payer: Self-pay | Admitting: Nurse Practitioner

## 2023-09-29 VITALS — BP 124/74 | HR 60 | Temp 97.5°F | Ht 64.0 in | Wt 160.2 lb

## 2023-09-29 DIAGNOSIS — F250 Schizoaffective disorder, bipolar type: Secondary | ICD-10-CM

## 2023-09-29 DIAGNOSIS — I10 Essential (primary) hypertension: Secondary | ICD-10-CM

## 2023-09-29 MED ORDER — NADOLOL 20 MG PO TABS
20.0000 mg | ORAL_TABLET | Freq: Every day | ORAL | 11 refills | Status: DC
Start: 2023-09-29 — End: 2024-01-06

## 2023-09-29 NOTE — Progress Notes (Signed)
Assessment and Plan:  Rebekah Pace was seen today for acute visit.  Diagnoses and all orders for this visit:  Essential hypertension - Decrease Nadolol to 20 mg every day and continue to monitor BP - continue DASH diet, exercise and monitor at home. Call if greater than 130/80.   -     nadolol (CORGARD) 20 MG tablet; Take 1 tablet (20 mg total) by mouth daily.  Schizoaffective disorder, bipolar type (HCC) Continue Abilify Continue following with psychiatry.       Further disposition pending results of labs. Discussed med's effects and SE's.   Over 30 minutes of exam, counseling, chart review, and critical decision making was performed.   Future Appointments  Date Time Provider Department Center  12/11/2023 11:00 AM [name], MD [facility] None  03/18/2024  2:30 PM Raynelle Dick, NP [facility] None    ------------------------------------------------------------------------------------------------------------------   HPI BP 124/74   Pulse 60   Temp (!) 97.5 F (36.4 C)   Ht 5\' 4"  (1.626 m)   Wt 160 lb 3.2 oz (72.7 kg)   SpO2 94%   BMI 27.50 kg/m  67 y.o.female presents for BP concerns. States her BP has been running low on Nadolol 40 mg every day- she has cut back to 1/2 tab. Brought her BP cuff today and it read 140/86 compared to our 148/82. Her readings at home have been running 90-120/50-60's, when she decreased to 1/2 tab BP 120's/70's. Recheck was 124/74 BP Readings from Last 3 Encounters:  09/29/23 124/74  08/19/23 122/80  03/19/23 118/72  Denies headaches, chest pain, shortness of breath and dizziness   Pulse Readings from Last 3 Encounters:  09/29/23 60  08/19/23 67  03/19/23 (!) 43     BMI is Body mass index is 27.5 kg/m., she has been working on diet and exercise. She is down 25 pounds from 03/2023. She is walking everyday. She tries to get enough protein and fruits/vegetables.  Wt Readings from Last 3 Encounters:  09/29/23 160 lb 3.2 oz (72.7 kg)   08/19/23 174 lb (78.9 kg)  03/19/23 185 lb 3.2 oz (84 kg)   Pt has schizoaffective bipolar disorder.  Symptoms are currently controlled on Abilify 2 mg every day. Continues to follow with psychiatry.    Past Medical History:  Diagnosis Date   Anxiety    Depression    Gout 05/17/2016   Hepatitis    Hypothyroidism    Labile hypertension    Mental disorder    Prediabetes    Thyroid disease      Allergies  Allergen Reactions   Sulfa Antibiotics Rash    Current Outpatient Medications on File Prior to Visit  Medication Sig   ARIPiprazole (ABILIFY) 2 MG tablet Take 2 mg by mouth daily.   Ascorbic Acid (VITAMIN C PO) Take by mouth.   levothyroxine (SYNTHROID) 75 MCG tablet Take  1 tablet  Daily  on an empty stomach with only water for 30 minutes & no Antacid meds, Calcium or Magnesium for 4 hours & avoid Biotin                                                   /  TAKE                                      BY                     MOUTH   MAGNESIUM PO Take by mouth.   rosuvastatin (CRESTOR) 5 MG tablet Take  1 tablet  Daily  on an empty stomach with only water for 30 minutes & no Antacid meds, Calcium or Magnesium for 4 hours & avoid Biotin                                                                         /                                                          TAKE                                                      BY                                                   MOUTH   VITAMIN D, CHOLECALCIFEROL, PO Take 5,000 Units by mouth 2 (two) times daily.    Zinc 50 MG TABS Take by mouth.   clotrimazole-betamethasone (LOTRISONE) cream Apply 1 Application topically 2 (two) times daily. (Patient not taking: Reported on 09/29/2023)   Multiple Vitamin (MULTIVITAMIN PO) Take by mouth. (Patient not taking: Reported on 09/29/2023)   No current facility-administered medications on file prior to visit.    ROS: all negative except above.   Physical  Exam:  BP 124/74   Pulse 60   Temp (!) 97.5 F (36.4 C)   Ht 5\' 4"  (1.626 m)   Wt 160 lb 3.2 oz (72.7 kg)   SpO2 94%   BMI 27.50 kg/m   General Appearance: Well nourished, in no apparent distress. Eyes: PERRLA, EOMs, conjunctiva no swelling or erythema Sinuses: No Frontal/maxillary tenderness ENT/Mouth: Ext aud canals clear, TMs without erythema, bulging. No erythema, swelling, or exudate on post pharynx.  Hearing normal.  Respiratory: Respiratory effort normal, BS equal bilaterally without rales, rhonchi, wheezing or stridor.  Cardio: RRR with no MRGs. Brisk peripheral pulses without edema.  Abdomen: Soft, + BS.  Musculoskeletal: Full ROM, 5/5 strength, normal gait.  Skin: Warm, dry without rashes, lesions, ecchymosis.  Neuro: Cranial nerves intact. Normal muscle tone, no cerebellar symptoms. Sensation intact.  Psych: Awake and oriented X 3, normal affect, Insight and Judgment appropriate.     Raynelle Dick, NP 2:28 PM [facility]

## 2023-09-29 NOTE — Patient Instructions (Signed)
Managing Your Hypertension Hypertension, also called high blood pressure, is when the force of the blood pressing against the walls of the arteries is too strong. Arteries are blood vessels that carry blood from your heart throughout your body. Hypertension forces the heart to work harder to pump blood and may cause the arteries to become narrow or stiff. Understanding blood pressure readings A blood pressure reading includes a higher number over a lower number: The first, or top, number is called the systolic pressure. It is a measure of the pressure in your arteries as your heart beats. The second, or bottom number, is called the diastolic pressure. It is a measure of the pressure in your arteries as the heart relaxes. For most people, a normal blood pressure is below 120/80. Your personal target blood pressure may vary depending on your medical conditions, your age, and other factors. Blood pressure is classified into four stages. Based on your blood pressure reading, your health care provider may use the following stages to determine what type of treatment you need, if any. Systolic pressure and diastolic pressure are measured in a unit called millimeters of mercury (mmHg). Normal Systolic pressure: below 120. Diastolic pressure: below 80. Elevated Systolic pressure: 120-129. Diastolic pressure: below 80. Hypertension stage 1 Systolic pressure: 130-139. Diastolic pressure: 80-89. Hypertension stage 2 Systolic pressure: 140 or above. Diastolic pressure: 90 or above. How can this condition affect me? Managing your hypertension is very important. Over time, hypertension can damage the arteries and decrease blood flow to parts of the body, including the brain, heart, and kidneys. Having untreated or uncontrolled hypertension can lead to: A heart attack. A stroke. A weakened blood vessel (aneurysm). Heart failure. Kidney damage. Eye damage. Memory and concentration problems. Vascular  dementia. What actions can I take to manage this condition? Hypertension can be managed by making lifestyle changes and possibly by taking medicines. Your health care provider will help you make a plan to bring your blood pressure within a normal range. You may be referred for counseling on a healthy diet and physical activity. Nutrition  Eat a diet that is high in fiber and potassium, and low in salt (sodium), added sugar, and fat. An example eating plan is called the DASH diet. DASH stands for Dietary Approaches to Stop Hypertension. To eat this way: Eat plenty of fresh fruits and vegetables. Try to fill one-half of your plate at each meal with fruits and vegetables. Eat whole grains, such as whole-wheat pasta, brown rice, or whole-grain bread. Fill about one-fourth of your plate with whole grains. Eat low-fat dairy products. Avoid fatty cuts of meat, processed or cured meats, and poultry with skin. Fill about one-fourth of your plate with lean proteins such as fish, chicken without skin, beans, eggs, and tofu. Avoid pre-made and processed foods. These tend to be higher in sodium, added sugar, and fat. Reduce your daily sodium intake. Many people with hypertension should eat less than 1,500 mg of sodium a day. Lifestyle  Work with your health care provider to maintain a healthy body weight or to lose weight. Ask what an ideal weight is for you. Get at least 30 minutes of exercise that causes your heart to beat faster (aerobic exercise) most days of the week. Activities may include walking, swimming, or biking. Include exercise to strengthen your muscles (resistance exercise), such as weight lifting, as part of your weekly exercise routine. Try to do these types of exercises for 30 minutes at least 3 days a week. Do   not use any products that contain nicotine or tobacco. These products include cigarettes, chewing tobacco, and vaping devices, such as e-cigarettes. If you need help quitting, ask your  health care provider. Control any long-term (chronic) conditions you have, such as high cholesterol or diabetes. Identify your sources of stress and find ways to manage stress. This may include meditation, deep breathing, or making time for fun activities. Alcohol use Do not drink alcohol if: Your health care provider tells you not to drink. You are pregnant, may be pregnant, or are planning to become pregnant. If you drink alcohol: Limit how much you have to: 0-1 drink a day for women. 0-2 drinks a day for men. Know how much alcohol is in your drink. In the [place] one drink equals one 12 oz bottle of beer (355 mL), one 5 oz glass of wine (148 mL), or one 1 oz glass of hard liquor (44 mL). Medicines Your health care provider may prescribe medicine if lifestyle changes are not enough to get your blood pressure under control and if: Your systolic blood pressure is 130 or higher. Your diastolic blood pressure is 80 or higher. Take medicines only as told by your health care provider. Follow the directions carefully. Blood pressure medicines must be taken as told by your health care provider. The medicine does not work as well when you skip doses. Skipping doses also puts you at risk for problems. Monitoring Before you monitor your blood pressure: Do not smoke, drink caffeinated beverages, or exercise within 30 minutes before taking a measurement. Use the bathroom and empty your bladder (urinate). Sit quietly for at least 5 minutes before taking measurements. Monitor your blood pressure at home as told by your health care provider. To do this: Sit with your back straight and supported. Place your feet flat on the floor. Do not cross your legs. Support your arm on a flat surface, such as a table. Make sure your upper arm is at heart level. Each time you measure, take two or three readings one minute apart and record the results. You may also need to have your blood pressure checked regularly by  your health care provider. General information Talk with your health care provider about your diet, exercise habits, and other lifestyle factors that may be contributing to hypertension. Review all the medicines you take with your health care provider because there may be side effects or interactions. Keep all follow-up visits. Your health care provider can help you create and adjust your plan for managing your high blood pressure. Where to find more information National Heart, Lung, and Blood Institute: www.nhlbi.nih.gov American Heart Association: www.heart.org Contact a health care provider if: You think you are having a reaction to medicines you have taken. You have repeated (recurrent) headaches. You feel dizzy. You have swelling in your ankles. You have trouble with your vision. Get help right away if: You develop a severe headache or confusion. You have unusual weakness or numbness, or you feel faint. You have severe pain in your chest or abdomen. You vomit repeatedly. You have trouble breathing. These symptoms may be an emergency. Get help right away. Call 911. Do not wait to see if the symptoms will go away. Do not drive yourself to the hospital. Summary Hypertension is when the force of blood pumping through your arteries is too strong. If this condition is not controlled, it may put you at risk for serious complications. Your personal target blood pressure may vary depending on your medical conditions,   your age, and other factors. For most people, a normal blood pressure is less than 120/80. Hypertension is managed by lifestyle changes, medicines, or both. Lifestyle changes to help manage hypertension include losing weight, eating a healthy, low-sodium diet, exercising more, stopping smoking, and limiting alcohol. This information is not intended to replace advice given to you by your health care provider. Make sure you discuss any questions you have with your health care  provider. Document Revised: 06/14/2021 Document Reviewed: 06/14/2021 Elsevier Patient Education  2024 [employer]  

## 2023-10-02 DIAGNOSIS — D485 Neoplasm of uncertain behavior of skin: Secondary | ICD-10-CM | POA: Diagnosis not present

## 2023-10-13 DIAGNOSIS — R251 Tremor, unspecified: Secondary | ICD-10-CM | POA: Diagnosis not present

## 2023-10-13 DIAGNOSIS — F5101 Primary insomnia: Secondary | ICD-10-CM | POA: Diagnosis not present

## 2023-10-13 DIAGNOSIS — F250 Schizoaffective disorder, bipolar type: Secondary | ICD-10-CM | POA: Diagnosis not present

## 2023-11-25 ENCOUNTER — Encounter: Payer: Self-pay | Admitting: *Deleted

## 2023-12-02 ENCOUNTER — Other Ambulatory Visit: Payer: Medicare Other

## 2023-12-04 ENCOUNTER — Other Ambulatory Visit: Payer: Self-pay

## 2023-12-04 DIAGNOSIS — E782 Mixed hyperlipidemia: Secondary | ICD-10-CM

## 2023-12-04 MED ORDER — ROSUVASTATIN CALCIUM 5 MG PO TABS: ORAL_TABLET | ORAL | 0 refills | Status: DC

## 2023-12-11 ENCOUNTER — Encounter: Payer: Medicare Other | Admitting: Internal Medicine

## 2024-01-01 ENCOUNTER — Ambulatory Visit: Payer: Medicare Other | Admitting: Family Medicine

## 2024-01-06 ENCOUNTER — Ambulatory Visit (INDEPENDENT_AMBULATORY_CARE_PROVIDER_SITE_OTHER): Payer: Medicare Other | Admitting: Family Medicine

## 2024-01-06 ENCOUNTER — Ambulatory Visit: Payer: Medicare Other | Admitting: Family Medicine

## 2024-01-06 VITALS — BP 134/82 | HR 76 | Temp 97.5°F | Ht 64.0 in | Wt 135.6 lb

## 2024-01-06 DIAGNOSIS — G252 Other specified forms of tremor: Secondary | ICD-10-CM | POA: Insufficient documentation

## 2024-01-06 DIAGNOSIS — I10 Essential (primary) hypertension: Secondary | ICD-10-CM | POA: Diagnosis not present

## 2024-01-06 DIAGNOSIS — I700 Atherosclerosis of aorta: Secondary | ICD-10-CM

## 2024-01-06 DIAGNOSIS — E039 Hypothyroidism, unspecified: Secondary | ICD-10-CM

## 2024-01-06 DIAGNOSIS — F250 Schizoaffective disorder, bipolar type: Secondary | ICD-10-CM | POA: Diagnosis not present

## 2024-01-06 DIAGNOSIS — M810 Age-related osteoporosis without current pathological fracture: Secondary | ICD-10-CM | POA: Insufficient documentation

## 2024-01-06 DIAGNOSIS — K7469 Other cirrhosis of liver: Secondary | ICD-10-CM

## 2024-01-06 MED ORDER — PROPRANOLOL HCL ER 60 MG PO CP24
60.0000 mg | ORAL_CAPSULE | Freq: Every day | ORAL | 0 refills | Status: DC
Start: 2024-01-06 — End: 2024-02-03

## 2024-01-06 MED ORDER — LEVOTHYROXINE SODIUM 75 MCG PO TABS
ORAL_TABLET | ORAL | 3 refills | Status: DC
Start: 2024-01-06 — End: 2024-02-17

## 2024-01-06 NOTE — Patient Instructions (Signed)
 VISIT SUMMARY:  Today, we discussed the management of your chronic conditions and made some adjustments to your medications. We also reviewed your general health and provided recommendations for maintaining a balanced diet and proper supplementation.  YOUR PLAN:  -HYPERTENSION: Hypertension, or high blood pressure, was previously managed with nadolol, but due to your weight loss and the inconvenience of splitting pills, we have switched you to propranolol extended release 60 mg once daily. This medication will help manage both your blood pressure and mild tremor. Please monitor your blood pressure daily and follow up in one month to reassess.  -ESSENTIAL TREMOR: Essential tremor is a condition that causes involuntary shaking. Your tremor was previously managed with nadolol, but since reducing the dose, it has slightly returned. We have prescribed propranolol extended release 60 mg once daily to help manage the tremor. If the tremor persists, we may consider further evaluation.  -SCHIZOAFFECTIVE DISORDER, BIPOLAR TYPE: Schizoaffective disorder, bipolar type, is a mental health condition that includes symptoms of both schizophrenia and mood disorders. You have successfully tapered off Abilify with your psychiatrist's guidance and are feeling better without it. You have a follow-up with your psychiatrist in April to discuss your current status and future management.  -AORTIC ATHEROSCLEROSIS: Aortic atherosclerosis is the buildup of fats, cholesterol, and other substances in and on the artery walls. You are currently managing this condition with rosuvastatin 5 mg daily. Continue taking this medication as prescribed.  -HYPOTHYROIDISM: Hypothyroidism is a condition where the thyroid gland does not produce enough thyroid hormone. You are managing this with levothyroxine 75 mcg daily. Continue taking this medication as prescribed.  -OSTEOPOROSIS: Osteoporosis is a condition that weakens bones, making them  fragile and more likely to break. You are currently taking vitamin D supplements, but we recommend adding a calcium supplement, such as Caltrate with vitamin D, to support bone health.  -NONALCOHOLIC FATTY LIVER DISEASE (NAFLD): NAFLD is a condition where fat builds up in the liver. You have made significant progress with weight loss and dietary changes, which should help improve your liver condition. Continue with your weight management and low-fat diet. You have a follow-up with your hepatologist in April.  -GENERAL HEALTH MAINTENANCE: We discussed the importance of maintaining a balanced diet rich in fruits, vegetables, lean proteins, and whole grains. Consider taking a multivitamin if your dietary intake is insufficient.  INSTRUCTIONS:  Please monitor your blood pressure daily and follow up in one month to reassess your blood pressure and tremor. You also have a follow-up appointment with your psychiatrist in April and with your hepatologist on April 21st.

## 2024-01-06 NOTE — Progress Notes (Signed)
 Assessment/Plan:   Assessment & Plan Hypertension Hypertension previously managed with nadolol, reduced from 40 mg to 10 mg due to weight loss and hypotension. Current blood pressure is 134/82 mmHg. She prefers not to split pills and nadolol is unavailable in 10 mg tablets. Propranolol extended release 60 mg once daily is chosen to manage both hypertension and mild tremor. Transitioning to propranolol does not require tapering off nadolol. - Discontinue nadolol - Prescribe propranolol extended release 60 mg once daily - Monitor blood pressure daily - Follow up in one month to reassess blood pressure and tremor  Resting Tremor Mild tremor previously managed with nadolol, which was effective. Tremor has slightly returned after reducing nadolol dose. Propranolol is chosen for its efficacy in managing tremors. If tremor persists, consider further evaluation to rule out other causes. - Prescribe propranolol extended release 60 mg once daily - Monitor tremor - Consider neurology referral if tremor persists  Schizoaffective Disorder, Bipolar Type Schizoaffective disorder, bipolar type, previously managed with Abilify. She has tapered off Abilify with psychiatrist's guidance and reports improvement without it. No current symptoms of mood disorder or psychosis. Follow-up with psychiatrist planned to discuss current status and future management. She does not intend to continue long-term follow-up with the psychiatrist unless necessary. - Follow up with psychiatrist in April  Aortic Atherosclerosis Aortic atherosclerosis managed with rosuvastatin. No current symptoms of cardiovascular disease. - Continue rosuvastatin 5 mg daily  Hypothyroidism Hypothyroidism managed with levothyroxine 75 mcg daily. No current symptoms of hypothyroidism. - Prescribe levothyroxine 75 mcg daily  Osteoporosis Osteoporosis managed with vitamin D supplementation. She is not currently taking calcium supplements and  has dietary insufficiency of calcium. Calcium supplementation is recommended to support bone health. - Recommend calcium supplement, such as Caltrate, with vitamin D - Continue vitamin D supplementation  Hepatic cirrhosis with fatty infiltrates Cirrhosis with significant hepatic steatosis noted in previous scans. She has lost significant weight and reduced dietary fat intake, which is expected to improve liver condition. Follow-up with hepatologist scheduled. She is motivated to continue weight management to further improve liver health. - Follow up with hepatologist in April - Continue weight management and low-fat diet  General Health Maintenance Discussion on the importance of a balanced diet and supplementation where necessary. Encouraged to maintain a diet rich in fruits, vegetables, lean proteins, and whole grains. Consideration of multivitamin if dietary intake is insufficient. - Encourage balanced diet with fruits, vegetables, lean proteins, and whole grains - Consider multivitamin if dietary intake is insufficient      Medications Discontinued During This Encounter  Medication Reason   ARIPiprazole (ABILIFY) 2 MG tablet    Zinc 50 MG TABS    nadolol (CORGARD) 20 MG tablet    levothyroxine (SYNTHROID) 75 MCG tablet Reorder    Return in about 1 month (around 02/06/2024) for BP, physical (fasting labs).    Subjective:   Encounter date: 01/06/2024  Rebekah Pace is a 68 y.o. female who has Schizoaffective disorder, bipolar type (HCC); Labile hypertension; Medication management; Hyperlipidemia, mixed; Vitamin D deficiency; Bipolar affective disorder, depressed, severe, with psychotic behavior (HCC); Hypothyroidism; Obesity (BMI 30.0-34.9); History of hepatitis C, s/p curative treatment; Gout; Other cirrhosis of liver (HCC); Hematuria, microscopic; Abnormal glucose; Stage 3a chronic kidney disease (HCC); Aortic atherosclerosis (HCC) per CT 06/2020; Chronic cystitis; Coagulation  defect (HCC); Resting tremor; Essential hypertension; and Age-related osteoporosis without current pathological fracture on their problem list..   She  has a past medical history of Anxiety, Depression, Gout (05/17/2016), Hepatitis,  Hypothyroidism, Labile hypertension, Mental disorder, Prediabetes, and Thyroid disease..   She presents with chief complaint of Establish Care (Patient has been taking 1/2 tablet of nadalol since January.) .   Discussed the use of AI scribe software for clinical note transcription with the patient, who gave verbal consent to proceed.  History of Present Illness Rebekah Pace is a 68 year old female who presents for a new primary care provider after the death of her previous doctor. She was referred by Dr. Michele Mcalpine for continued management of her chronic conditions.  She is transitioning her care to a new primary care provider following the sudden death of her long-term physician, Dr. Oneta Rack, with whom she had a strong rapport. She had been a patient of his for over twenty years and is now seeking continued management of her chronic conditions.  She has a history of hypertension, previously managed with nadolol. Initially prescribed at 40 mg, her dose was reduced to 20 mg and then to 10 mg due to significant weight loss and resultant low blood pressure. She has been cutting the 20 mg tablets in half to achieve the 10 mg dose. She has been tracking her blood pressure and was planning to discuss a new prescription with Dr. Michele Mcalpine before his passing.  She has a history of essential tremor, which was managed with nadolol. The tremor has slightly returned since reducing the nadolol dose, but it is not severe. She attributes the tremor's initial onset to the use of Abilify, which she has since discontinued.  She has a history of schizoaffective disorder, bipolar type, for which she was previously on Abilify. She gradually tapered off the medication with the guidance of her  psychiatrist, Dr. Sandria Manly, and has not taken it since January 1st. She feels significantly better without the medication and does not experience any symptoms of depression, mania, or psychosis.  She has a history of aortic atherosclerosis and is on rosuvastatin 5 mg daily for high cholesterol. She also has hypothyroidism, managed with levothyroxine 75 mcg daily. She takes magnesium for a previous deficiency and high-dose vitamin D for osteoporosis, which was diagnosed on her last bone density scan.  She has a history of fatty liver disease, for which she is followed by Dr. Lowella Grip at the [facility]. She underwent a fat density scan in November, which showed significant liver fat, prompting her to lose over 40 pounds through dietary changes. She is scheduled for a follow-up appointment on April 21st.  She reports some left lower abdominal discomfort and a palpable nodule, which was previously evaluated and deemed non-concerning. The nodule appears more pronounced following her weight loss.  No chest pain, shortness of breath, headaches, blurry vision, mental health symptoms such as depression, mania, SI, HI, hallucinations, delusions, or psychosis.       Past Surgical History:  Procedure Laterality Date   arm fracture     COLONOSCOPY  20 + years ago    at [facility] per pt, normal exam   ROTATOR CUFF REPAIR      Outpatient Medications Prior to Visit  Medication Sig Dispense Refill   Ascorbic Acid (VITAMIN C PO) Take by mouth.     Cyanocobalamin (VITAMIN B-12 PO) Take by mouth.     MAGNESIUM PO Take by mouth.     Pyridoxine HCl (VITAMIN B6 PO) Take by mouth.     rosuvastatin (CRESTOR) 5 MG tablet Take  1 tablet  Daily  on an empty stomach with only water for  30 minutes & no Antacid meds, Calcium or Magnesium for 4 hours & avoid Biotin                                                                         /                                                          TAKE                                                       BY                                                   MOUTH 90 tablet 0   VITAMIN D, CHOLECALCIFEROL, PO Take 5,000 Units by mouth 2 (two) times daily.      levothyroxine (SYNTHROID) 75 MCG tablet Take  1 tablet  Daily  on an empty stomach with only water for 30 minutes & no Antacid meds, Calcium or Magnesium for 4 hours & avoid Biotin                                                   /                                   TAKE                                      BY                     MOUTH 90 tablet 3   nadolol (CORGARD) 20 MG tablet Take 1 tablet (20 mg total) by mouth daily. 30 tablet 11   ARIPiprazole (ABILIFY) 2 MG tablet Take 2 mg by mouth daily. (Patient not taking: Reported on 01/06/2024)     Zinc 50 MG TABS Take by mouth. (Patient not taking: Reported on 01/06/2024)     No facility-administered medications prior to visit.    Family History  Problem Relation Age of Onset   Hyperlipidemia Mother    Hypertension Mother    Colon polyps Mother    Colon cancer Mother 26   Cancer Father        Lung   Hyperlipidemia Brother    Esophageal cancer Neg Hx    Rectal cancer Neg Hx    Stomach cancer Neg Hx     Social History  Socioeconomic History   Marital status: Divorced    Spouse name: Not on file   Number of children: Not on file   Years of education: Not on file   Highest education level: Not on file  Occupational History   Not on file  Tobacco Use   Smoking status: Never   Smokeless tobacco: Never  Vaping Use   Vaping status: Never Used  Substance and Sexual Activity   Alcohol use: No   Drug use: No   Sexual activity: Not Currently    Birth control/protection: None  Other Topics Concern   Not on file  Social History Narrative   Not on file   Social Drivers of Health   Financial Resource Strain: Not on file  Food Insecurity: Not on file  Transportation Needs: Not on file  Physical Activity: Not on file  Stress: Not on file   Social Connections: Not on file  Intimate Partner Violence: Not on file                                                                                                  Objective:  Physical Exam: BP 134/82   Pulse 76   Temp (!) 97.5 F (36.4 C) (Temporal)   Ht 5\' 4"  (1.626 m)   Wt 135 lb 9.6 oz (61.5 kg)   SpO2 97%   BMI 23.28 kg/m    Physical Exam VITALS: BP- 134/82 MEASUREMENTS: Weight- 135.6 lbs. GENERAL: Alert, cooperative, well developed, no acute distress HEENT: Normocephalic, normal oropharynx, moist mucous membranes CHEST: Clear to auscultation bilaterally, no wheezes, rhonchi, or crackles CARDIOVASCULAR: Normal heart rate and rhythm, S1 and S2 normal without murmurs ABDOMEN: Soft, non-tender, non-distended, without organomegaly, normal bowel sounds EXTREMITIES: No cyanosis or edema NEUROLOGICAL: Cranial nerves grossly intact, moves all extremities without gross motor or sensory deficit, mild resting tremor     No results found.  No results found for this or any previous visit (from the past 2160 hours).      Garner Nash, MD, MS

## 2024-01-19 ENCOUNTER — Telehealth: Payer: Self-pay

## 2024-01-19 NOTE — Telephone Encounter (Signed)
 Left patient a detailed voice message to return call to office.

## 2024-01-19 NOTE — Telephone Encounter (Signed)
 Copied from CRM [id number]. Topic: General - Other >> Jan 16, 2024 10:35 AM Elizebeth Brooking wrote: Reason for CRM: Patient called in stating she would like to speak with Dr.Thompson nurse about some issues, callback number is   [phone]

## 2024-02-02 ENCOUNTER — Other Ambulatory Visit: Payer: Self-pay | Admitting: Nurse Practitioner

## 2024-02-02 DIAGNOSIS — Z8619 Personal history of other infectious and parasitic diseases: Secondary | ICD-10-CM | POA: Diagnosis not present

## 2024-02-02 DIAGNOSIS — K7581 Nonalcoholic steatohepatitis (NASH): Secondary | ICD-10-CM

## 2024-02-02 DIAGNOSIS — K7402 Hepatic fibrosis, advanced fibrosis: Secondary | ICD-10-CM | POA: Diagnosis not present

## 2024-02-02 DIAGNOSIS — K7460 Unspecified cirrhosis of liver: Secondary | ICD-10-CM

## 2024-02-02 DIAGNOSIS — R1900 Intra-abdominal and pelvic swelling, mass and lump, unspecified site: Secondary | ICD-10-CM | POA: Diagnosis not present

## 2024-02-02 DIAGNOSIS — K824 Cholesterolosis of gallbladder: Secondary | ICD-10-CM | POA: Diagnosis not present

## 2024-02-03 ENCOUNTER — Other Ambulatory Visit: Payer: Self-pay | Admitting: Family Medicine

## 2024-02-03 DIAGNOSIS — G252 Other specified forms of tremor: Secondary | ICD-10-CM

## 2024-02-08 DIAGNOSIS — K824 Cholesterolosis of gallbladder: Secondary | ICD-10-CM | POA: Insufficient documentation

## 2024-02-11 ENCOUNTER — Other Ambulatory Visit

## 2024-02-12 ENCOUNTER — Ambulatory Visit: Admitting: Family Medicine

## 2024-02-12 VITALS — BP 136/80 | HR 70 | Temp 97.6°F | Wt 130.6 lb

## 2024-02-12 DIAGNOSIS — E782 Mixed hyperlipidemia: Secondary | ICD-10-CM | POA: Diagnosis not present

## 2024-02-12 DIAGNOSIS — R0989 Other specified symptoms and signs involving the circulatory and respiratory systems: Secondary | ICD-10-CM | POA: Diagnosis not present

## 2024-02-12 DIAGNOSIS — I10 Essential (primary) hypertension: Secondary | ICD-10-CM

## 2024-02-12 DIAGNOSIS — E039 Hypothyroidism, unspecified: Secondary | ICD-10-CM | POA: Diagnosis not present

## 2024-02-12 DIAGNOSIS — E559 Vitamin D deficiency, unspecified: Secondary | ICD-10-CM | POA: Diagnosis not present

## 2024-02-12 DIAGNOSIS — Z0001 Encounter for general adult medical examination with abnormal findings: Secondary | ICD-10-CM

## 2024-02-12 DIAGNOSIS — K7469 Other cirrhosis of liver: Secondary | ICD-10-CM

## 2024-02-12 DIAGNOSIS — I700 Atherosclerosis of aorta: Secondary | ICD-10-CM

## 2024-02-12 DIAGNOSIS — G252 Other specified forms of tremor: Secondary | ICD-10-CM

## 2024-02-12 DIAGNOSIS — M810 Age-related osteoporosis without current pathological fracture: Secondary | ICD-10-CM

## 2024-02-12 DIAGNOSIS — M1A09X0 Idiopathic chronic gout, multiple sites, without tophus (tophi): Secondary | ICD-10-CM | POA: Diagnosis not present

## 2024-02-12 DIAGNOSIS — N1831 Chronic kidney disease, stage 3a: Secondary | ICD-10-CM | POA: Diagnosis not present

## 2024-02-12 DIAGNOSIS — F250 Schizoaffective disorder, bipolar type: Secondary | ICD-10-CM

## 2024-02-12 DIAGNOSIS — Z0000 Encounter for general adult medical examination without abnormal findings: Secondary | ICD-10-CM

## 2024-02-12 LAB — CBC WITH DIFFERENTIAL/PLATELET
Basophils Absolute: 0.1 10*3/uL (ref 0.0–0.1)
Basophils Relative: 1.3 % (ref 0.0–3.0)
Eosinophils Absolute: 0.1 10*3/uL (ref 0.0–0.7)
Eosinophils Relative: 2.5 % (ref 0.0–5.0)
HCT: 40.6 % (ref 36.0–46.0)
Hemoglobin: 13.3 g/dL (ref 12.0–15.0)
Lymphocytes Relative: 23.9 % (ref 12.0–46.0)
Lymphs Abs: 1.3 10*3/uL (ref 0.7–4.0)
MCHC: 32.6 g/dL (ref 30.0–36.0)
MCV: 93.1 fl (ref 78.0–100.0)
Monocytes Absolute: 0.5 10*3/uL (ref 0.1–1.0)
Monocytes Relative: 9.7 % (ref 3.0–12.0)
Neutro Abs: 3.3 10*3/uL (ref 1.4–7.7)
Neutrophils Relative %: 62.6 % (ref 43.0–77.0)
Platelets: 269 10*3/uL (ref 150.0–400.0)
RBC: 4.37 Mil/uL (ref 3.87–5.11)
RDW: 12.9 % (ref 11.5–15.5)
WBC: 5.3 10*3/uL (ref 4.0–10.5)

## 2024-02-12 LAB — URINALYSIS, ROUTINE W REFLEX MICROSCOPIC
Bilirubin Urine: NEGATIVE
Ketones, ur: NEGATIVE
Leukocytes,Ua: NEGATIVE
Nitrite: NEGATIVE
Specific Gravity, Urine: <=1.005 — AB (ref 1.000–1.030)
Total Protein, Urine: NEGATIVE
Urine Glucose: NEGATIVE
Urobilinogen, UA: 0.2 (ref 0.0–1.0)
pH: 6.5 (ref 5.0–8.0)

## 2024-02-12 LAB — COMPREHENSIVE METABOLIC PANEL WITH GFR
ALT: 12 U/L (ref 0–35)
AST: 21 U/L (ref 0–37)
Albumin: 4.8 g/dL (ref 3.5–5.2)
Alkaline Phosphatase: 66 U/L (ref 39–117)
BUN: 12 mg/dL (ref 6–23)
CO2: 27 meq/L (ref 19–32)
Calcium: 10 mg/dL (ref 8.4–10.5)
Chloride: 98 meq/L (ref 96–112)
Creatinine, Ser: 0.79 mg/dL (ref 0.40–1.20)
GFR: 76.69 mL/min (ref >60.00)
Glucose, Bld: 103 mg/dL — ABNORMAL HIGH (ref 70–99)
Potassium: 3.9 meq/L (ref 3.5–5.1)
Sodium: 134 meq/L — ABNORMAL LOW (ref 135–145)
Total Bilirubin: 0.9 mg/dL (ref 0.2–1.2)
Total Protein: 7.2 g/dL (ref 6.0–8.3)

## 2024-02-12 LAB — APTT: aPTT: 33.2 s (ref 25.4–36.8)

## 2024-02-12 LAB — MICROALBUMIN / CREATININE URINE RATIO
Creatinine,U: 38.8 mg/dL
Microalb Creat Ratio: UNDETERMINED mg/g (ref 0.0–30.0)
Microalb, Ur: <0.7 mg/dL

## 2024-02-12 LAB — LIPID PANEL
Cholesterol: 138 mg/dL (ref 0–200)
HDL: 48 mg/dL (ref >39.00)
LDL Cholesterol: 69 mg/dL (ref 0–99)
NonHDL: 90.42
Total CHOL/HDL Ratio: 3
Triglycerides: 106 mg/dL (ref 0.0–149.0)
VLDL: 21.2 mg/dL (ref 0.0–40.0)

## 2024-02-12 LAB — HEMOGLOBIN A1C: Hgb A1c MFr Bld: 5.2 % (ref 4.6–6.5)

## 2024-02-12 LAB — URIC ACID: Uric Acid, Serum: 5.7 mg/dL (ref 2.4–7.0)

## 2024-02-12 LAB — PROTIME-INR
INR: 1.2 ratio — ABNORMAL HIGH (ref 0.8–1.0)
Prothrombin Time: 12.4 s (ref 9.6–13.1)

## 2024-02-12 NOTE — Progress Notes (Signed)
 Assessment  Assessment/Plan:  Assessment and Plan Assessment & Plan Wellness Visit Annual physical examination conducted with no acute concerns. She is feeling well overall. - Perform physical examination - Conduct lab tests as discussed for various conditions  Hypertension Blood pressure is mildly elevated at 142/80 mmHg. Previously controlled on nadolol , but she was switched to propranolol  ER 60 mg due to side effects. She opted to continue nadolol  due to concerns about propranolol . - Monitor blood pressure closely - Consider adjusting propranolol  if blood pressure remains elevated  Hyperlipidemia Managed with rosuvastatin  5 mg for hyperlipidemia and aortic atherosclerosis. Liver function to be monitored due to statin use. - Order lipid panel - Monitor liver function tests  Aortic atherosclerosis Identified on CT scan in 2021. Managed with rosuvastatin . No acute concerns. - Continue rosuvastatin  5 mg  Chronic kidney disease, stage 3A Stage 3A chronic kidney disease with ongoing renal function monitoring. 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 Monitoring liver function and clotting factors as per hepatology's request. - Order CBC with differential - Order liver function tests with metabolic panel - Order PT, INR, and APTT - Order AFP tumor marker  Hypothyroidism Managed with levothyroxine  75 mcg. Monitoring thyroid  function. - Order thyroid  panel  Vitamin D  deficiency Managed with 10,000 IU daily. Monitoring vitamin D  levels. - Recheck vitamin D  levels  Age-related osteoporosis without current fracture Considering referral to osteoporosis clinic for specialized management due to concurrent liver and kidney issues.  Optimizing calcium  and vitamin D  intake. - Consider referral to osteoporosis clinic - Optimize calcium  and vitamin D  intake  Bipolar affective disorder vs. Schizoaffective disorder Follows with psychiatry for management. - Encourage continued follow-up with psychiatry     Medications Discontinued During This Encounter  Medication Reason   propranolol  ER (INDERAL  LA) 60 MG 24 hr capsule     Patient Counseling(The following topics were reviewed and/or handout was given):  -Nutrition: Stressed importance of moderation in sodium/caffeine intake, saturated fat and cholesterol, caloric balance, sufficient intake of fresh fruits, vegetables, and fiber.  -Stressed the importance of regular exercise.   -Substance Abuse: Discussed cessation/primary prevention of tobacco, alcohol, or other drug use; driving or other dangerous activities under the influence; availability of treatment for abuse.   -Injury prevention: Discussed safety belts, safety helmets, smoke detector, smoking near bedding or upholstery.   -Sexuality: Discussed sexually transmitted diseases, partner selection, use of condoms, avoidance of unintended pregnancy and contraceptive alternatives.   -Dental health: Discussed importance of regular tooth brushing, flossing, and dental visits.  -Health maintenance and immunizations reviewed. Please refer to Health maintenance section.  Return in about 6 months (around 08/14/2024).        Subjective:   Encounter date: 02/12/2024  Chief Complaint  Patient presents with   Annual Exam    Pt is fasting. Would like labs drawn from orders from [facility]. Pended labs    Discussed the use of AI scribe software for clinical note transcription with the patient, who gave verbal consent to proceed.  History of Present Illness Rebekah Pace is a 68 year old female who presents for an annual physical exam.  She has a history of hypertension, previously managed with nadolol , which  also addressed her essential tremor. Due to side effects, she was switched to propranolol  extended release  60 mg capsules, but she opted to continue with nadolol  as it was effective. Her blood pressure was mildly elevated at 142/80 mmHg during the visit.  Her hypothyroidism is managed with levothyroxine  75 mcg, and she reports no current symptoms of thyroid  dysfunction.  She is on rosuvastatin  5 mg for hyperlipidemia. Aortic atherosclerosis was incidentally identified on a CT scan in September 2021, and she continues treatment with statins. She is aware of the need for monitoring liver function due to statin use.  She takes 10,000 IU of vitamin D  daily for her vitamin D  deficiency, with ongoing monitoring of her levels.  She has a history of bipolar affective disorder versus schizoaffective disorder and is under psychiatric care.  She has stage 3A chronic kidney disease and a history of gout, though she reports no recent symptoms of gout and questions the diagnosis.  She has hepatitis-induced cirrhosis and has recently established care with hepatology, with ongoing monitoring of liver function and clotting factors.  She has age-related osteoporosis without current pathologic fracture and is considering further evaluation at an osteoporosis clinic.  She reports significant weight loss from 180 pounds to around 130 pounds, attributing it to lifestyle changes. She mentions a nodule on her abdomen, which is more noticeable after the weight loss.       02/12/2024   10:50 AM 01/06/2024    2:35 PM 03/19/2023    3:01 PM 06/10/2022   11:44 PM 03/06/2022   11:38 AM  Depression screen PHQ 2/9  Decreased Interest 0 0 0 0 0  Down, Depressed, Hopeless 0 0 0 0 0  PHQ - 2 Score 0 0 0 0 0  Altered sleeping 0 0     Tired, decreased energy 0 0     Change in appetite 0 0     Feeling bad or failure about yourself  0 0     Trouble concentrating 0 0     Moving slowly or fidgety/restless 0 0     Suicidal  thoughts 0 0     PHQ-9 Score 0 0     Difficult doing work/chores Not difficult at all Not difficult at all          02/12/2024   10:51 AM 01/06/2024    2:35 PM  GAD 7 : Generalized Anxiety Score  Nervous, Anxious, on Edge 0 0  Control/stop worrying 0 0  Worry too much - different things 0 0  Trouble relaxing 0 0  Restless 0 0  Easily annoyed or irritable 0 0  Afraid - awful might happen 0 0  Total GAD 7 Score 0 0  Anxiety Difficulty Not difficult at all Not difficult at all    Health Maintenance Due  Topic Date Due   COVID-19 Vaccine (4 - 2024-25 season) 06/15/2023   Medicare Annual Wellness (AWV)  03/18/2024     PMH:  The following were reviewed and entered/updated in epic: Past Medical History:  Diagnosis Date   Anxiety    Depression    Gout 05/17/2016   Hepatitis    Hypothyroidism    Labile hypertension    Mental disorder    Prediabetes    Thyroid  disease     Patient Active Problem List   Diagnosis Date Noted   Resting tremor 01/06/2024   Essential hypertension 01/06/2024   Age-related osteoporosis without current pathological fracture 01/06/2024   Coagulation defect (HCC) 12/04/2022   Chronic cystitis 05/26/2021   Aortic atherosclerosis (HCC) per CT 06/2020 02/16/2021   Stage  3a chronic kidney disease (HCC) 03/02/2020   Abnormal glucose 11/14/2019   Hematuria, microscopic 07/31/2018   Other cirrhosis of liver (HCC) 11/23/2016   Gout 05/17/2016   History of hepatitis C, s/p curative treatment 01/31/2016   Obesity (BMI 30.0-34.9) 07/24/2015   Hypothyroidism 12/13/2014   Hyperlipidemia, mixed 09/01/2014   Vitamin D  deficiency 09/01/2014   Bipolar affective disorder, depressed, severe, with psychotic behavior (HCC) 09/01/2014   Medication management 03/30/2014   Labile hypertension    Schizoaffective disorder, bipolar type (HCC) 09/12/2011    Past Surgical History:  Procedure Laterality Date   arm fracture     COLONOSCOPY  20 + years ago    at [facility]  per pt, normal exam   ROTATOR CUFF REPAIR      Family History  Problem Relation Age of Onset   Hyperlipidemia Mother    Hypertension Mother    Colon polyps Mother    Colon cancer Mother 55   Cancer Father        Lung   Hyperlipidemia Brother    Esophageal cancer Neg Hx    Rectal cancer Neg Hx    Stomach cancer Neg Hx     Medications- reviewed and updated Outpatient Medications Prior to Visit  Medication Sig Dispense Refill   Ascorbic Acid (VITAMIN C PO) Take by mouth.     CALCIUM  PO Take by mouth.     Cyanocobalamin  (VITAMIN B-12 PO) Take by mouth.     levothyroxine  (SYNTHROID ) 75 MCG tablet Take  1 tablet  Daily  on an empty stomach with only water for 30 minutes & no Antacid meds, Calcium  or Magnesium  for 4 hours & avoid Biotin                                                   /                                   TAKE                                      BY                     MOUTH 90 tablet 3   MAGNESIUM  PO Take by mouth.     nadolol  (CORGARD ) 20 MG tablet Take 10 mg by mouth.     Pyridoxine HCl (VITAMIN B6 PO) Take by mouth.     rosuvastatin  (CRESTOR ) 5 MG tablet Take  1 tablet  Daily  on an empty stomach with only water for 30 minutes & no Antacid meds, Calcium  or Magnesium  for 4 hours & avoid Biotin                                                                         /  TAKE                                                      BY                                                   MOUTH 90 tablet 0   VITAMIN D , CHOLECALCIFEROL, PO Take 5,000 Units by mouth 2 (two) times daily.      propranolol  ER (INDERAL  LA) 60 MG 24 hr capsule TAKE 1 CAPSULE BY MOUTH DAILY 30 capsule 0   No facility-administered medications prior to visit.    Allergies  Allergen Reactions   Sulfa Antibiotics Rash    Social History   Socioeconomic History   Marital status: Divorced    Spouse name: Not on file   Number of children: Not on file    Years of education: Not on file   Highest education level: Not on file  Occupational History   Not on file  Tobacco Use   Smoking status: Never   Smokeless tobacco: Never  Vaping Use   Vaping status: Never Used  Substance and Sexual Activity   Alcohol use: No   Drug use: No   Sexual activity: Not Currently    Birth control/protection: None  Other Topics Concern   Not on file  Social History Narrative   Not on file   Social Drivers of Health   Financial Resource Strain: Not on file  Food Insecurity: Low Risk  (02/02/2024)   Received from [facility]   Hunger Vital Sign    Worried About Running Out of Food in the Last Year: Never true    Ran Out of Food in the Last Year: Never true  Transportation Needs: No Transportation Needs (02/02/2024)   Received from [facility]    In the past 12 months, has lack of reliable transportation kept you from medical appointments, meetings, work or from getting things needed for daily living? : No  Physical Activity: Not on file  Stress: Not on file  Social Connections: Not on file           Objective:  Physical Exam: BP 136/80   Pulse 70   Temp 97.6 F (36.4 C) (Temporal)   Wt 130 lb 9.6 oz (59.2 kg)   SpO2 99%   BMI 22.42 kg/m   Body mass index is 22.42 kg/m. Wt Readings from Last 3 Encounters:  02/12/24 130 lb 9.6 oz (59.2 kg)  01/06/24 135 lb 9.6 oz (61.5 kg)  09/29/23 160 lb 3.2 oz (72.7 kg)   Physical Exam Constitutional:      General: She is not in acute distress.    Appearance: Normal appearance. She is not ill-appearing or toxic-appearing.  HENT:     Head: Normocephalic and atraumatic.     Right Ear: Hearing, tympanic membrane, ear canal and external ear normal. There is no impacted cerumen.     Left Ear: Hearing, tympanic membrane, ear canal and external ear normal. There is no impacted cerumen.     Nose: Nose normal. No congestion.     Mouth/Throat:     Lips: No lesions.  Mouth:  Mucous membranes are moist.     Pharynx: Oropharynx is clear. No oropharyngeal exudate.  Eyes:     General: No scleral icterus.       Right eye: No discharge.        Left eye: No discharge.     Conjunctiva/sclera: Conjunctivae normal.     Pupils: Pupils are equal, round, and reactive to light.  Neck:     Thyroid : No thyroid  mass, thyromegaly or thyroid  tenderness.  Cardiovascular:     Rate and Rhythm: Normal rate and regular rhythm.     Pulses: Normal pulses.     Heart sounds: Normal heart sounds.  Pulmonary:     Effort: Pulmonary effort is normal. No respiratory distress.     Breath sounds: Normal breath sounds.  Abdominal:     General: Abdomen is flat. Bowel sounds are normal.     Palpations: Abdomen is soft.  Musculoskeletal:        General: Normal range of motion.     Cervical back: Normal range of motion.     Right lower leg: No edema.     Left lower leg: No edema.  Lymphadenopathy:     Cervical: No cervical adenopathy.  Skin:    General: Skin is warm and dry.     Findings: No rash.  Neurological:     General: No focal deficit present.     Mental Status: She is alert and oriented to person, place, and time. Mental status is at baseline.     Deep Tendon Reflexes:     Reflex Scores:      Patellar reflexes are 2+ on the right side and 2+ on the left side. Psychiatric:        Mood and Affect: Mood normal.        Behavior: Behavior normal.        Thought Content: Thought content normal.        Judgment: Judgment normal.         Prior labs:   No results found for this or any previous visit (from the past 2160 hours).  Lab Results  Component Value Date   CHOL 80 08/19/2023   CHOL 118 03/19/2023   CHOL 117 12/04/2022   Lab Results  Component Value Date   HDL 34 (L) 08/19/2023   HDL 40 (L) 03/19/2023   HDL 42 (L) 12/04/2022   Lab Results  Component Value Date   LDLCALC 28 08/19/2023   LDLCALC 52 03/19/2023   LDLCALC 52 12/04/2022   Lab Results   Component Value Date   TRIG 93 08/19/2023   TRIG 181 (H) 03/19/2023   TRIG 157 (H) 12/04/2022   Lab Results  Component Value Date   CHOLHDL 2.4 08/19/2023   CHOLHDL 3.0 03/19/2023   CHOLHDL 2.8 12/04/2022   No results found for: "LDLDIRECT"  Last metabolic panel Lab Results  Component Value Date   GLUCOSE 114 (H) 08/19/2023   NA 135 08/19/2023   K 5.3 08/19/2023   CL 100 08/19/2023   CO2 28 08/19/2023   BUN 9 08/19/2023   CREATININE 0.95 08/19/2023   EGFR 65 08/19/2023   CALCIUM  10.4 08/19/2023   PHOS 3.4 02/04/2016   PROT 7.2 08/19/2023   ALBUMIN 4.0 02/24/2017   BILITOT 0.8 08/19/2023   ALKPHOS 86 02/24/2017   AST 21 08/19/2023   ALT 14 08/19/2023   ANIONGAP 7 01/28/2017    Lab Results  Component Value Date   HGBA1C 5.4 12/04/2022  Last CBC Lab Results  Component Value Date   WBC 5.7 08/19/2023   HGB 13.8 08/19/2023   HCT 41.7 08/19/2023   MCV 89.9 08/19/2023   MCH 29.7 08/19/2023   RDW 12.2 08/19/2023   PLT 247 08/19/2023    Lab Results  Component Value Date   TSH 2.05 08/19/2023      Last vitamin D  Lab Results  Component Value Date   VD25OH 87 12/04/2022    Lab Results  Component Value Date   BILIRUBINUR NEGATIVE 01/28/2017   PROTEINUR NEGATIVE 12/04/2022   UROBILINOGEN 0.2 02/09/2012   LEUKOCYTESUR TRACE (A) 12/04/2022    Lab Results  Component Value Date   MICROALBUR 0.6 12/04/2022   MICROALBUR <0.2 11/28/2021     At today's visit, we discussed treatment options, associated risk and benefits, and engage in counseling as needed.  Additionally the following were reviewed: Past medical records, past medical and surgical history, family and social background, as well as relevant laboratory results, imaging findings, and specialty notes, where applicable.  This message was generated using dictation software, and as a result, it may contain unintentional typos or errors.  Nevertheless, extensive effort was made to accurately convey  at the pertinent aspects of the patient visit.    There may have been are other unrelated non-urgent complaints, but due to the busy schedule and the amount of time already spent with her, time does not permit to address these issues at today's visit. Another appointment may have or has been requested to review these additional issues.     Harle Libra, MD, MS

## 2024-02-12 NOTE — Patient Instructions (Signed)
  VISIT SUMMARY: Today, you had your annual physical exam. Overall, you are feeling well, and we discussed your ongoing health conditions and management plans.  YOUR PLAN: -HYPERTENSION: Your blood pressure was slightly elevated at 142/80 mmHg. Hypertension means high blood pressure, which can increase the risk of heart disease and stroke. We will continue to monitor your blood pressure closely and consider adjusting your medication if it remains elevated.  -HYPERLIPIDEMIA: Hyperlipidemia means having high levels of fats (lipids) in your blood, which can increase the risk of heart disease. You are currently taking rosuvastatin  5 mg to manage this condition. We will order a lipid panel and monitor your liver function tests to ensure the medication is working effectively and safely.  -AORTIC ATHEROSCLEROSIS: Aortic atherosclerosis is the buildup of fats, cholesterol, and other substances in and on the artery walls, which can restrict blood flow. This was identified on a CT scan in 2021. You are managing this condition with rosuvastatin  5 mg, and there are no acute concerns at this time.  -CHRONIC KIDNEY DISEASE, STAGE 3A: Chronic kidney disease means your kidneys are not working as well as they should. Stage 3A indicates moderate kidney damage. We will continue to monitor your kidney function with various tests, including a complete metabolic panel, urinalysis, microalbumin creatinine ratio, and screening for diabetes.  -HEPATITIS-INDUCED CIRRHOSIS: Cirrhosis is severe scarring of the liver caused by long-term liver damage. You are under the care of hepatology for this condition. We will continue to monitor your liver function and clotting factors with several tests, including a CBC, liver function tests, and tumor markers.  -HYPOTHYROIDISM: Hypothyroidism means your thyroid  gland is underactive and not producing enough thyroid  hormone. You are managing this condition with levothyroxine  75 mcg. We will  order a thyroid  panel to monitor your thyroid  function.  -VITAMIN D  DEFICIENCY: Vitamin D  deficiency means you do not have enough vitamin D , which is important for bone health. You are taking 10,000 IU of vitamin D  daily. We will recheck your vitamin D  levels to ensure they are within the normal range.  -AGE-RELATED OSTEOPOROSIS WITHOUT CURRENT FRACTURE: Osteoporosis is a condition where bones become weak and brittle. You do not have any current fractures. We are considering referring you to an osteoporosis clinic for specialized management and will optimize your calcium  and vitamin D  intake.  -BIPOLAR AFFECTIVE DISORDER VS. SCHIZOAFFECTIVE DISORDER: You are under psychiatric care for this condition. It is important to continue your follow-up with psychiatry to manage your mental health effectively.  INSTRUCTIONS: Please follow up with the recommended lab tests and continue your current medications as discussed. Consider the referral to the osteoporosis clinic for specialized management. Continue your follow-up with psychiatry fo r your mental health. If you have any new symptoms or concerns, please contact our office.                      Contains text generated by Abridge.                                 Contains text generated by Abridge.

## 2024-02-17 ENCOUNTER — Other Ambulatory Visit: Payer: Self-pay | Admitting: Family Medicine

## 2024-02-17 ENCOUNTER — Inpatient Hospital Stay: Admission: RE | Admit: 2024-02-17 | Source: Ambulatory Visit

## 2024-02-17 DIAGNOSIS — E039 Hypothyroidism, unspecified: Secondary | ICD-10-CM

## 2024-02-17 LAB — THYROID PANEL WITH TSH
Free Thyroxine Index: 4.5 — ABNORMAL HIGH (ref 1.4–3.8)
T3 Uptake: 31 % (ref 22–35)
T4, Total: 14.4 ug/dL — ABNORMAL HIGH (ref 5.1–11.9)
TSH: 0.02 m[IU]/L — ABNORMAL LOW (ref 0.40–4.50)

## 2024-02-17 LAB — VITAMIN D 1,25 DIHYDROXY
Vitamin D 1, 25 (OH)2 Total: 35 pg/mL (ref 18–72)
Vitamin D2 1, 25 (OH)2: <8 pg/mL
Vitamin D3 1, 25 (OH)2: 35 pg/mL

## 2024-02-17 LAB — AFP TUMOR MARKER: AFP-Tumor Marker: 9 ng/mL — ABNORMAL HIGH

## 2024-02-17 LAB — PARATHYROID HORMONE, INTACT (NO CA): PTH: 26 pg/mL (ref 16–77)

## 2024-02-17 MED ORDER — LEVOTHYROXINE SODIUM 50 MCG PO TABS: 50.0000 ug | ORAL_TABLET | Freq: Every day | ORAL | 3 refills | Status: AC

## 2024-02-24 ENCOUNTER — Other Ambulatory Visit: Payer: Self-pay | Admitting: Nurse Practitioner

## 2024-02-24 ENCOUNTER — Ambulatory Visit
Admission: RE | Admit: 2024-02-24 | Discharge: 2024-02-24 | Disposition: A | Discharge status: Home / Self Care | Source: Ambulatory Visit | Attending: Nurse Practitioner | Admitting: Nurse Practitioner

## 2024-02-24 DIAGNOSIS — R1909 Other intra-abdominal and pelvic swelling, mass and lump: Secondary | ICD-10-CM | POA: Diagnosis not present

## 2024-02-24 DIAGNOSIS — K7460 Unspecified cirrhosis of liver: Secondary | ICD-10-CM

## 2024-02-24 DIAGNOSIS — K8020 Calculus of gallbladder without cholecystitis without obstruction: Secondary | ICD-10-CM | POA: Diagnosis not present

## 2024-02-24 DIAGNOSIS — K7581 Nonalcoholic steatohepatitis (NASH): Secondary | ICD-10-CM

## 2024-02-24 DIAGNOSIS — R1900 Intra-abdominal and pelvic swelling, mass and lump, unspecified site: Secondary | ICD-10-CM

## 2024-02-24 DIAGNOSIS — K769 Liver disease, unspecified: Secondary | ICD-10-CM | POA: Diagnosis not present

## 2024-02-26 ENCOUNTER — Telehealth: Payer: Self-pay

## 2024-02-26 NOTE — Telephone Encounter (Signed)
 Copied from CRM [id number]. Topic: Referral - Status >> Feb 25, 2024 12:56 PM [place] J wrote: Reason for CRM: Patient calling to let Dr. Hildy Lowers know that she has not heard anything back from the referral that was sent to endocrinology. She would like to know what next steps are looking like.

## 2024-02-26 NOTE — Telephone Encounter (Signed)
 Referral status states ready to schedule. LVM with [facility] contact information for pt to call and schedule an appt..  [phone] [phone]

## 2024-03-17 ENCOUNTER — Other Ambulatory Visit: Payer: Self-pay

## 2024-03-17 DIAGNOSIS — E782 Mixed hyperlipidemia: Secondary | ICD-10-CM

## 2024-03-17 MED ORDER — ROSUVASTATIN CALCIUM 5 MG PO TABS: ORAL_TABLET | ORAL | 0 refills | Status: DC

## 2024-03-18 ENCOUNTER — Ambulatory Visit: Payer: Medicare Other | Admitting: Nurse Practitioner

## 2024-04-05 DIAGNOSIS — F5101 Primary insomnia: Secondary | ICD-10-CM | POA: Diagnosis not present

## 2024-04-05 DIAGNOSIS — F250 Schizoaffective disorder, bipolar type: Secondary | ICD-10-CM | POA: Diagnosis not present

## 2024-06-09 ENCOUNTER — Encounter: Payer: Self-pay | Admitting: "Endocrinology

## 2024-06-09 ENCOUNTER — Ambulatory Visit (INDEPENDENT_AMBULATORY_CARE_PROVIDER_SITE_OTHER): Admitting: "Endocrinology

## 2024-06-09 VITALS — BP 122/80 | HR 81 | Ht 64.0 in | Wt 125.8 lb

## 2024-06-09 DIAGNOSIS — E039 Hypothyroidism, unspecified: Secondary | ICD-10-CM

## 2024-06-09 NOTE — Progress Notes (Signed)
 Outpatient Endocrinology Note Obadiah Birmingham, MD  06/09/24   Rebekah Pace 13-Jun-1956 [id number]  Referring Provider: Sebastian Beverley NOVAK, MD Primary Care Provider: Sebastian Beverley NOVAK, MD Subjective  Chief Complaint  Patient presents with   Follow-up    Assessment & Plan  Rebekah Pace was seen today for follow-up.  Diagnoses and all orders for this visit:  Acquired hypothyroidism -     TSH + free T4 -     TSH + free T4   Rebekah Pace is currently taking levothyroxine  50 mcg po qd. Patient is currently biochemically hyperthyroid.  Educated on thyroid  axis.  Recommend the following: Take levothyroxine  50 mcg every morning.  Advised to take levothyroxine  first thing in the morning on empty stomach and wait at least 30 minutes to 1 hour before eating or drinking anything or taking any other medications. Space out levothyroxine  by 4 hours from any acid reflux medication/fibrate/iron/calcium /multivitamin. Advised to take birth control pills and nutritional supplements in the evening. Repeat lab before next visit or sooner if symptoms of hyperthyroidism or hypothyroidism develop.  Notify us  immediately in case of pregnancy/breastfeeding or significant weight gain or loss. Counseled on compliance and follow up needs.  I have reviewed current medications, nurse's notes, allergies, vital signs, past medical and surgical history, family medical history, and social history for this encounter. Counseled patient on symptoms, examination findings, lab findings, imaging results, treatment decisions and monitoring and prognosis. The patient understood the recommendations and agrees with the treatment plan. All questions regarding treatment plan were fully answered.   Return in about 4 months (around 10/09/2024) for visit + labs before next visit, labs today.   Obadiah Birmingham, MD  06/09/24   I have reviewed current medications, nurse's notes, allergies, vital signs, past medical and surgical  history, family medical history, and social history for this encounter. Counseled patient on symptoms, examination findings, lab findings, imaging results, treatment decisions and monitoring and prognosis. The patient understood the recommendations and agrees with the treatment plan. All questions regarding treatment plan were fully answered.   History of Present Illness Rebekah Pace is a 68 y.o. year old female who presents to our clinic with hypothyroidism diagnosed years ago.    Symptoms suggestive of HYPOTHYROIDISM:  fatigue Yes weight gain No cold intolerance  Yes constipation  No  Symptoms suggestive of HYPERTHYROIDISM:  weight loss  Yes heat intolerance No hyperdefecation  No palpitations  No  Compressive symptoms:  dysphagia  No dysphonia  No positional dyspnea (especially with simultaneous arms elevation)  No  Smokes  No On biotin  No Personal history of head/neck surgery/irradiation  No  Physical Exam  BP 122/80 (BP Location: Left Arm, Patient Position: Sitting, Cuff Size: Normal)   Pulse 81   Ht 5' 4 (1.626 m)   Wt 125 lb 12.8 oz (57.1 kg)   SpO2 94%   BMI 21.59 kg/m  Constitutional: well developed, well nourished Head: normocephalic, atraumatic, no exophthalmos Eyes: sclera anicteric, no redness Neck: no thyromegaly, no thyroid  tenderness; no nodules palpated Lungs: normal respiratory effort Neurology: alert and oriented, no fine hand tremor Skin: dry, no appreciable rashes Musculoskeletal: no appreciable defects Psychiatric: normal mood and affect  Allergies Allergies  Allergen Reactions   Sulfa Antibiotics Rash    Current Medications Patient's Medications  New Prescriptions   No medications on file  Previous Medications   ASCORBIC ACID (VITAMIN C PO)    Take by mouth.   CALCIUM  PO  Take by mouth.   CYANOCOBALAMIN  (VITAMIN B-12 PO)    Take by mouth.   LEVOTHYROXINE  (SYNTHROID ) 50 MCG TABLET    Take 1 tablet (50 mcg total) by mouth daily  before breakfast.   MAGNESIUM  PO    Take by mouth.   NADOLOL  (CORGARD ) 20 MG TABLET    Take 10 mg by mouth.   PYRIDOXINE HCL (VITAMIN B6 PO)    Take by mouth.   ROSUVASTATIN  (CRESTOR ) 5 MG TABLET    Take  1 tablet  Daily  on an empty stomach with only water for 30 minutes & no Antacid meds, Calcium  or Magnesium  for 4 hours & avoid Biotin                                                                         /                                                          TAKE                                                      BY                                                   MOUTH   VITAMIN D , CHOLECALCIFEROL, PO    Take 5,000 Units by mouth 2 (two) times daily.   Modified Medications   No medications on file  Discontinued Medications   No medications on file    Past Medical History Past Medical History:  Diagnosis Date   Anxiety    Depression    Gout 05/17/2016   Hepatitis    Hypothyroidism    Labile hypertension    Mental disorder    Prediabetes    Thyroid  disease     Past Surgical History Past Surgical History:  Procedure Laterality Date   arm fracture     COLONOSCOPY  20 + years ago    at [facility] per pt, normal exam   ROTATOR CUFF REPAIR      Family History family history includes Cancer in her father; Colon cancer (age of onset: 68) in her mother; Colon polyps in her mother; Hyperlipidemia in her brother and mother; Hypertension in her mother.  Social History Social History   Socioeconomic History   Marital status: Divorced    Spouse name: Not on file   Number of children: Not on file   Years of education: Not on file   Highest education level: Not on file  Occupational History   Not on file  Tobacco Use   Smoking status: Never   Smokeless tobacco: Never  Vaping Use   Vaping status: Never Used  Substance and Sexual Activity  Alcohol use: No   Drug use: No   Sexual activity: Not Currently    Birth control/protection: None  Other Topics Concern   Not on  file  Social History Narrative   Not on file   Social Drivers of Health   Financial Resource Strain: Not on file  Food Insecurity: Low Risk  (02/02/2024)   Received from [facility]   Hunger Vital Sign    Within the past 12 months, you worried that your food would run out before you got money to buy more: Never true    Within the past 12 months, the food you bought just didn't last and you didn't have money to get more. : Never true  Transportation Needs: No Transportation Needs (02/02/2024)   Received from [facility]   Transportation    In the past 12 months, has lack of reliable transportation kept you from medical appointments, meetings, work or from getting things needed for daily living? : No  Physical Activity: Not on file  Stress: Not on file  Social Connections: Not on file  Intimate Partner Violence: Not on file    Laboratory Investigations Lab Results  Component Value Date   TSH 0.02 (L) 02/12/2024   TSH 2.05 08/19/2023   TSH 0.80 03/19/2023   FREET4 1.47 02/04/2012   FREET4 1.47 11/14/2011   FREET4 1.25 09/13/2011     No results found for: TSI   No components found for: TRAB   Lab Results  Component Value Date   CHOL 138 02/12/2024   Lab Results  Component Value Date   HDL 48.00 02/12/2024   Lab Results  Component Value Date   LDLCALC 69 02/12/2024   Lab Results  Component Value Date   TRIG 106.0 02/12/2024   Lab Results  Component Value Date   CHOLHDL 3 02/12/2024   Lab Results  Component Value Date   CREATININE 0.79 02/12/2024   Lab Results  Component Value Date   GFR 76.69 02/12/2024      Component Value Date/Time   NA 134 (L) 02/12/2024 1139   K 3.9 02/12/2024 1139   CL 98 02/12/2024 1139   CO2 27 02/12/2024 1139   GLUCOSE 103 (H) 02/12/2024 1139   BUN 12 02/12/2024 1139   CREATININE 0.79 02/12/2024 1139   CREATININE 0.95 08/19/2023 1143   CALCIUM  10.0 02/12/2024 1139   PROT 7.2 02/12/2024 1139   ALBUMIN 4.8 02/12/2024  1139   AST 21 02/12/2024 1139   ALT 12 02/12/2024 1139   ALKPHOS 66 02/12/2024 1139   BILITOT 0.9 02/12/2024 1139   GFRNONAA 55 (L) 11/17/2020 1116   GFRAA 64 11/17/2020 1116      Latest Ref Rng & Units 02/12/2024   11:39 AM 08/19/2023   11:43 AM 03/19/2023    3:13 PM  BMP  Glucose 70 - 99 mg/dL 896  885  88   BUN 6 - 23 mg/dL 12  9  15    Creatinine 0.40 - 1.20 mg/dL 9.20  9.04  9.06   BUN/Creat Ratio 6 - 22 (calc)  SEE NOTE:  SEE NOTE:   Sodium 135 - 145 mEq/L 134  135  140   Potassium 3.5 - 5.1 mEq/L 3.9  5.3  4.7   Chloride 96 - 112 mEq/L 98  100  104   CO2 19 - 32 mEq/L 27  28  26    Calcium  8.4 - 10.5 mg/dL 89.9  89.5  89.9        Component  Value Date/Time   WBC 5.3 02/12/2024 1139   RBC 4.37 02/12/2024 1139   HGB 13.3 02/12/2024 1139   HCT 40.6 02/12/2024 1139   PLT 269.0 02/12/2024 1139   MCV 93.1 02/12/2024 1139   MCH 29.7 08/19/2023 1143   MCHC 32.6 02/12/2024 1139   RDW 12.9 02/12/2024 1139   LYMPHSABS 1.3 02/12/2024 1139   MONOABS 0.5 02/12/2024 1139   EOSABS 0.1 02/12/2024 1139   BASOSABS 0.1 02/12/2024 1139      Parts of this note may have been dictated using voice recognition software. There may be variances in spelling and vocabulary which are unintentional. Not all errors are proofread. Please notify the dino if any discrepancies are noted or if the meaning of any statement is not clear.

## 2024-06-10 LAB — TSH+FREE T4: TSH W/REFLEX TO FT4: 1.07 m[IU]/L (ref 0.40–4.50)

## 2024-06-12 ENCOUNTER — Other Ambulatory Visit: Payer: Self-pay | Admitting: Family Medicine

## 2024-06-12 DIAGNOSIS — E782 Mixed hyperlipidemia: Secondary | ICD-10-CM

## 2024-07-05 ENCOUNTER — Ambulatory Visit (INDEPENDENT_AMBULATORY_CARE_PROVIDER_SITE_OTHER)

## 2024-07-05 VITALS — BP 122/62 | HR 83 | Temp 98.1°F | Ht 64.0 in | Wt 125.4 lb

## 2024-07-05 DIAGNOSIS — Z0000 Encounter for general adult medical examination without abnormal findings: Secondary | ICD-10-CM | POA: Diagnosis not present

## 2024-07-05 NOTE — Patient Instructions (Signed)
 Rebekah Pace,  Thank you for taking the time for your Medicare Wellness Visit. I appreciate your continued commitment to your health goals. Please review the care plan we discussed, and feel free to reach out if I can assist you further.  Medicare recommends these wellness visits once per year to help you and your care team stay ahead of potential health issues. These visits are designed to focus on prevention, allowing your provider to concentrate on managing your acute and chronic conditions during your regular appointments.  Please note that Annual Wellness Visits do not include a physical exam. Some assessments may be limited, especially if the visit was conducted virtually. If needed, we may recommend a separate in-person follow-up with your provider.  Ongoing Care Seeing your primary care provider every 3 to 6 months helps us  monitor your health and provide consistent, personalized care.   Referrals If a referral was made during today's visit and you haven't received any updates within two weeks, please contact the referred provider directly to check on the status.  Recommended Screenings:  Health Maintenance  Topic Date Due   Zoster (Shingles) Vaccine (1 of 2) Never done   Cologuard (Stool DNA test)  06/24/2020   Flu Shot  05/14/2024   COVID-19 Vaccine (4 - 2025-26 season) 06/14/2024   Breast Cancer Screening  06/11/2024   Medicare Annual Wellness Visit  07/05/2025   Pneumococcal Vaccine for age over 59  Completed   DEXA scan (bone density measurement)  Completed   Hepatitis C Screening  Completed   HPV Vaccine  Aged Out   Meningitis B Vaccine  Aged Out   DTaP/Tdap/Td vaccine  Discontinued       07/05/2024    1:50 PM  Advanced Directives  Does Patient Have a Medical Advance Directive? No  Would patient like information on creating a medical advance directive? No - Patient declined   Advance Care Planning is important because it: Ensures you receive medical care that aligns  with your values, goals, and preferences. Provides guidance to your family and loved ones, reducing the emotional burden of decision-making during critical moments.  Vision: Annual vision screenings are recommended for early detection of glaucoma, cataracts, and diabetic retinopathy. These exams can also reveal signs of chronic conditions such as diabetes and high blood pressure.  Dental: Annual dental screenings help detect early signs of oral cancer, gum disease, and other conditions linked to overall health, including heart disease and diabetes.  Please see the attached documents for additional preventive care recommendations.

## 2024-07-05 NOTE — Progress Notes (Signed)
 Subjective:   Rebekah Pace is a 68 y.o. who presents for a Medicare Wellness preventive visit.  As a reminder, Annual Wellness Visits don't include a physical exam, and some assessments may be limited, especially if this visit is performed virtually. We may recommend an in-person follow-up visit with your provider if needed.  Visit Complete: In person    Persons Participating in Visit: Patient.  AWV Questionnaire: No: Patient Medicare AWV questionnaire was not completed prior to this visit.  Cardiac Risk Factors include: advanced age (>36men, >74 women);dyslipidemia;hypertension     Objective:    Today's Vitals   07/05/24 1344 07/05/24 1409  BP: (!) 142/60 122/62  Pulse: 83   Temp: 98.1 F (36.7 C)   TempSrc: Oral   SpO2: 94%   Weight: 125 lb 6.4 oz (56.9 kg)   Height: 5' 4 (1.626 m)    Body mass index is 21.52 kg/m.     07/05/2024    1:50 PM 03/19/2023    3:01 PM 03/06/2022   11:36 AM 05/24/2021    3:13 PM 03/02/2020   12:14 PM 01/27/2019   11:30 AM 01/07/2018   11:32 AM  Advanced Directives  Does Patient Have a Medical Advance Directive? No Yes No Yes No No No   Type of Special educational needs teacher of [place];Living will  Healthcare Power of Attorney     Does patient want to make changes to medical advance directive?  No - Patient declined  No - Patient declined     Copy of Healthcare Power of Attorney in Chart?  No - copy requested  No - copy requested     Would patient like information on creating a medical advance directive? No - Patient declined  No - Patient declined  Yes (MAU/Ambulatory/Procedural Areas - Information given) No - Patient declined  Yes (MAU/Ambulatory/Procedural Areas - Information given)      Data saved with a previous flowsheet row definition    Current Medications (verified) Outpatient Encounter Medications as of 07/05/2024  Medication Sig   Ascorbic Acid (VITAMIN C PO) Take by mouth.   CALCIUM  PO Take by mouth.   Cyanocobalamin   (VITAMIN B-12 PO) Take by mouth.   levothyroxine  (SYNTHROID ) 50 MCG tablet Take 1 tablet (50 mcg total) by mouth daily before breakfast.   MAGNESIUM  PO Take by mouth.   Pyridoxine HCl (VITAMIN B6 PO) Take by mouth.   rosuvastatin  (CRESTOR ) 5 MG tablet TAKE 1 TABLET BY MOUTH DAILY ON AN EMPTY STOMACH WITH ONLY WATER FOR 30 MINUTES AND NO ANTACID MEDS, CALCIUM  OR MAGNESIUM  FOR 4 HOURS AND AVOID BIOTIN   VITAMIN D , CHOLECALCIFEROL, PO Take 5,000 Units by mouth 2 (two) times daily.    nadolol  (CORGARD ) 20 MG tablet Take 10 mg by mouth. (Patient not taking: Reported on 07/05/2024)   No facility-administered encounter medications on file as of 07/05/2024.    Allergies (verified) Sulfa antibiotics   History: Past Medical History:  Diagnosis Date   Anxiety    Depression    Gout 05/17/2016   Hepatitis    Hypothyroidism    Labile hypertension    Mental disorder    Prediabetes    Thyroid  disease    Past Surgical History:  Procedure Laterality Date   arm fracture     COLONOSCOPY  20 + years ago    at [facility] per pt, normal exam   ROTATOR CUFF REPAIR     Family History  Problem Relation Age of Onset   Hyperlipidemia  Mother    Hypertension Mother    Colon polyps Mother    Colon cancer Mother 24   Cancer Father        Lung   Hyperlipidemia Brother    Esophageal cancer Neg Hx    Rectal cancer Neg Hx    Stomach cancer Neg Hx    Social History   Socioeconomic History   Marital status: Divorced    Spouse name: Not on file   Number of children: Not on file   Years of education: Not on file   Highest education level: Not on file  Occupational History   Not on file  Tobacco Use   Smoking status: Never   Smokeless tobacco: Never  Vaping Use   Vaping status: Never Used  Substance and Sexual Activity   Alcohol use: No   Drug use: No   Sexual activity: Not Currently    Birth control/protection: None  Other Topics Concern   Not on file  Social History Narrative   Not on file    Social Drivers of Health   Financial Resource Strain: Low Risk  (07/05/2024)   Overall Financial Resource Strain (CARDIA)    Difficulty of Paying Living Expenses: Not hard at all  Food Insecurity: No Food Insecurity (07/05/2024)   Hunger Vital Sign    Worried About Running Out of Food in the Last Year: Never true    Ran Out of Food in the Last Year: Never true  Transportation Needs: No Transportation Needs (07/05/2024)   PRAPARE - Administrator, Civil Service (Medical): No    Lack of Transportation (Non-Medical): No  Physical Activity: Sufficiently Active (07/05/2024)   Exercise Vital Sign    Days of Exercise per Week: 6 days    Minutes of Exercise per Session: 40 min  Stress: No Stress Concern Present (07/05/2024)   [employer] of Occupational Health - Occupational Stress Questionnaire    Feeling of Stress: Not at all  Social Connections: Socially Isolated (07/05/2024)   Social Connection and Isolation Panel    Frequency of Communication with Friends and Family: Three times a week    Frequency of Social Gatherings with Friends and Family: Once a week    Attends Religious Services: Never    Database administrator or Organizations: No    Attends Engineer, structural: Never    Marital Status: Divorced    Tobacco Counseling Counseling given: Not Answered    Clinical Intake:  Pre-visit preparation completed: Yes  Pain : No/denies pain     Nutritional Status: BMI of 19-24  Normal Nutritional Risks: None Diabetes: No  Lab Results  Component Value Date   HGBA1C 5.2 02/12/2024   HGBA1C 5.4 12/04/2022   HGBA1C 5.1 06/11/2022     How often do you need to have someone help you when you read instructions, pamphlets, or other written materials from your doctor or pharmacy?: 1 - Never  Interpreter Needed?: No  Information entered by :: NAllen LPN   Activities of Daily Living     07/05/2024    1:45 PM  In your present state of health, do you  have any difficulty performing the following activities:  Hearing? 0  Vision? 0  Difficulty concentrating or making decisions? 0  Walking or climbing stairs? 0  Dressing or bathing? 0  Doing errands, shopping? 0  Preparing Food and eating ? N  Using the Toilet? N  In the past six months, have you accidently leaked urine?  N  Do you have problems with loss of bowel control? N  Managing your Medications? N  Managing your Finances? N  Housekeeping or managing your Housekeeping? N    Patient Care Team: Sebastian Beverley NOVAK, MD as PCP - General (Family Medicine) Verta Royden DASEN, [place] as Consulting Physician (Podiatry) Debrah Lamar BIRCH, MD (Inactive) as Consulting Physician (Gastroenterology) Darcie Fallow, OD as Referring Physician (Optometry) Hays Morrison, CRNP as Nurse Practitioner (Nurse Practitioner) Zamor, Rush Pian, MD as Consulting Physician (Gastroenterology) Hays Morrison, CRNP as Nurse Practitioner (Gastroenterology) Maurice Loving, MD as Referring Physician (Psychiatry) Robinson Mayo, OD as Referring Physician (Optometry)  I have updated your Care Teams any recent Medical Services you may have received from other providers in the past year.     Assessment:   This is a routine wellness examination for [place].  Hearing/Vision screen Hearing Screening - Comments:: Denies hearing issues Vision Screening - Comments:: Regular eye exams, Dr. Robinson   Goals Addressed             This Visit's Progress    Patient Stated       07/05/2024, continue walking 6 days a week, maintain a healthy weight and be conscience of diet       Depression Screen     07/05/2024    1:53 PM 02/12/2024   10:50 AM 01/06/2024    2:35 PM 03/19/2023    3:01 PM 06/10/2022   11:44 PM 03/06/2022   11:38 AM 11/27/2021   10:25 PM  PHQ 2/9 Scores  PHQ - 2 Score 0 0 0 0 0 0 0  PHQ- 9 Score  0 0        Fall Risk     07/05/2024    1:52 PM 01/06/2024    2:35 PM 03/19/2023    3:01 PM 06/10/2022   11:44 PM  03/06/2022   11:36 AM  Fall Risk   Falls in the past year? 0 0 0 0 0  Number falls in past yr: 0 0 0  0  Injury with Fall? 0 0 0  0  Risk for fall due to : Medication side effect No Fall Risks No Fall Risks No Fall Risks No Fall Risks  Follow up Falls evaluation completed;Falls prevention discussed Falls evaluation completed Falls evaluation completed;Falls prevention discussed Falls prevention discussed;Education provided;Falls evaluation completed  Falls evaluation completed;Falls prevention discussed      Data saved with a previous flowsheet row definition    MEDICARE RISK AT HOME:  Medicare Risk at Home Any stairs in or around the home?: No If so, are there any without handrails?: No Home free of loose throw rugs in walkways, pet beds, electrical cords, etc?: Yes Adequate lighting in your home to reduce risk of falls?: Yes Life alert?: No Use of a cane, walker or w/c?: No Grab bars in the bathroom?: No Shower chair or bench in shower?: No Elevated toilet seat or a handicapped toilet?: No  TIMED UP AND GO:  Was the test performed?  Yes  Length of time to ambulate 10 feet: 5 sec Gait steady and fast without use of assistive device  Cognitive Function: 6CIT completed        07/05/2024    1:53 PM  6CIT Screen  What Year? 0 points  What month? 0 points  What time? 0 points  Count back from 20 0 points  Months in reverse 0 points  Repeat phrase 0 points  Total Score 0 points    Immunizations  Immunization History  Administered Date(s) Administered   INFLUENZA, HIGH DOSE SEASONAL PF 08/19/2023   Influenza Inj Mdck Quad With Preservative 08/02/2019   PFIZER(Purple Top)SARS-COV-2 Vaccination 05/05/2020, 05/05/2020, 05/26/2020   PNEUMOCOCCAL CONJUGATE-20 03/06/2022   Pneumococcal-Unspecified 10/14/2001   Td 10/15/1995    Screening Tests Health Maintenance  Topic Date Due   Zoster Vaccines- Shingrix (1 of 2) Never done   Fecal DNA (Cologuard)  06/24/2020    Influenza Vaccine  05/14/2024   COVID-19 Vaccine (4 - 2025-26 season) 06/14/2024   Mammogram  06/11/2024   Medicare Annual Wellness (AWV)  07/05/2025   Pneumococcal Vaccine: 50+ Years  Completed   DEXA SCAN  Completed   Hepatitis C Screening  Completed   HPV VACCINES  Aged Out   Meningococcal B Vaccine  Aged Out   DTaP/Tdap/Td  Discontinued    Health Maintenance Items Addressed: Thinking about flu vaccine. Declines covid vaccine. Will schedule mammogram  Additional Screening:  Vision Screening: Recommended annual ophthalmology exams for early detection of glaucoma and other disorders of the eye. Is the patient up to date with their annual eye exam?  Yes  Who is the provider or what is the name of the office in which the patient attends annual eye exams? Dr. Robinson  Dental Screening: Recommended annual dental exams for proper oral hygiene  Community Resource Referral / Chronic Care Management: CRR required this visit?  No   CCM required this visit?  No   Plan:    I have personally reviewed and noted the following in the patient's chart:   Medical and social history Use of alcohol, tobacco or illicit drugs  Current medications and supplements including opioid prescriptions. Patient is not currently taking opioid prescriptions. Functional ability and status Nutritional status Physical activity Advanced directives List of other physicians Hospitalizations, surgeries, and ER visits in previous 12 months Vitals Screenings to include cognitive, depression, and falls Referrals and appointments  In addition, I have reviewed and discussed with patient certain preventive protocols, quality metrics, and best practice recommendations. A written personalized care plan for preventive services as well as general preventive health recommendations were provided to patient.   Rebekah FORBES Dawn, LPN   [id number]   After Visit Summary: (In Person-Printed) AVS printed and given to the  patient  Notes: Nothing significant to report at this time.

## 2024-08-04 ENCOUNTER — Other Ambulatory Visit: Payer: Self-pay | Admitting: Nurse Practitioner

## 2024-08-04 DIAGNOSIS — K7581 Nonalcoholic steatohepatitis (NASH): Secondary | ICD-10-CM | POA: Diagnosis not present

## 2024-08-04 DIAGNOSIS — K7402 Hepatic fibrosis, advanced fibrosis: Secondary | ICD-10-CM

## 2024-08-12 ENCOUNTER — Telehealth: Payer: Self-pay

## 2024-08-12 ENCOUNTER — Encounter: Payer: Self-pay | Admitting: Family Medicine

## 2024-08-12 ENCOUNTER — Ambulatory Visit: Admitting: Family Medicine

## 2024-08-12 VITALS — BP 125/83 | HR 80 | Temp 97.0°F | Resp 18 | Wt 119.8 lb

## 2024-08-12 DIAGNOSIS — Z1231 Encounter for screening mammogram for malignant neoplasm of breast: Secondary | ICD-10-CM | POA: Diagnosis not present

## 2024-08-12 DIAGNOSIS — K7469 Other cirrhosis of liver: Secondary | ICD-10-CM | POA: Diagnosis not present

## 2024-08-12 DIAGNOSIS — M1A09X0 Idiopathic chronic gout, multiple sites, without tophus (tophi): Secondary | ICD-10-CM

## 2024-08-12 DIAGNOSIS — I700 Atherosclerosis of aorta: Secondary | ICD-10-CM

## 2024-08-12 DIAGNOSIS — M810 Age-related osteoporosis without current pathological fracture: Secondary | ICD-10-CM | POA: Diagnosis not present

## 2024-08-12 DIAGNOSIS — E782 Mixed hyperlipidemia: Secondary | ICD-10-CM

## 2024-08-12 DIAGNOSIS — N1831 Chronic kidney disease, stage 3a: Secondary | ICD-10-CM

## 2024-08-12 DIAGNOSIS — E039 Hypothyroidism, unspecified: Secondary | ICD-10-CM

## 2024-08-12 DIAGNOSIS — E559 Vitamin D deficiency, unspecified: Secondary | ICD-10-CM

## 2024-08-12 DIAGNOSIS — I10 Essential (primary) hypertension: Secondary | ICD-10-CM | POA: Diagnosis not present

## 2024-08-12 LAB — URINALYSIS, ROUTINE W REFLEX MICROSCOPIC
Bilirubin Urine: NEGATIVE
Ketones, ur: NEGATIVE
Leukocytes,Ua: NEGATIVE
Nitrite: NEGATIVE
Specific Gravity, Urine: <=1.005 — AB (ref 1.000–1.030)
Total Protein, Urine: NEGATIVE
Urine Glucose: NEGATIVE
Urobilinogen, UA: 0.2 (ref 0.0–1.0)
pH: 6 (ref 5.0–8.0)

## 2024-08-12 LAB — MICROALBUMIN / CREATININE URINE RATIO
Creatinine,U: 36.7 mg/dL
Microalb Creat Ratio: UNDETERMINED mg/g (ref 0.0–30.0)
Microalb, Ur: <0.7 mg/dL

## 2024-08-12 LAB — LIPID PANEL
Cholesterol: 133 mg/dL (ref 0–200)
HDL: 63.8 mg/dL (ref >39.00)
LDL Cholesterol: 56 mg/dL (ref 0–99)
NonHDL: 68.74
Total CHOL/HDL Ratio: 2
Triglycerides: 62 mg/dL (ref 0.0–149.0)
VLDL: 12.4 mg/dL (ref 0.0–40.0)

## 2024-08-12 LAB — COMPREHENSIVE METABOLIC PANEL WITH GFR
ALT: 17 U/L (ref 0–35)
AST: 27 U/L (ref 0–37)
Albumin: 5.1 g/dL (ref 3.5–5.2)
Alkaline Phosphatase: 78 U/L (ref 39–117)
BUN: 18 mg/dL (ref 6–23)
CO2: 27 meq/L (ref 19–32)
Calcium: 9.9 mg/dL (ref 8.4–10.5)
Chloride: 99 meq/L (ref 96–112)
Creatinine, Ser: 0.97 mg/dL (ref 0.40–1.20)
GFR: 59.74 mL/min — ABNORMAL LOW (ref >60.00)
Glucose, Bld: 96 mg/dL (ref 70–99)
Potassium: 3.6 meq/L (ref 3.5–5.1)
Sodium: 135 meq/L (ref 135–145)
Total Bilirubin: 0.8 mg/dL (ref 0.2–1.2)
Total Protein: 8 g/dL (ref 6.0–8.3)

## 2024-08-12 LAB — CBC WITH DIFFERENTIAL/PLATELET
Basophils Absolute: 0.1 K/uL (ref 0.0–0.1)
Basophils Relative: 1.7 % (ref 0.0–3.0)
Eosinophils Absolute: 0.1 K/uL (ref 0.0–0.7)
Eosinophils Relative: 3.2 % (ref 0.0–5.0)
HCT: 39.1 % (ref 36.0–46.0)
Hemoglobin: 13.3 g/dL (ref 12.0–15.0)
Lymphocytes Relative: 25.8 % (ref 12.0–46.0)
Lymphs Abs: 1.1 K/uL (ref 0.7–4.0)
MCHC: 34.1 g/dL (ref 30.0–36.0)
MCV: 87.5 fl (ref 78.0–100.0)
Monocytes Absolute: 0.4 K/uL (ref 0.1–1.0)
Monocytes Relative: 9.1 % (ref 3.0–12.0)
Neutro Abs: 2.6 K/uL (ref 1.4–7.7)
Neutrophils Relative %: 60.2 % (ref 43.0–77.0)
Platelets: 227 K/uL (ref 150.0–400.0)
RBC: 4.46 Mil/uL (ref 3.87–5.11)
RDW: 13.3 % (ref 11.5–15.5)
WBC: 4.2 K/uL (ref 4.0–10.5)

## 2024-08-12 LAB — APTT: aPTT: 31.6 s (ref 25.4–36.8)

## 2024-08-12 LAB — URIC ACID: Uric Acid, Serum: 5.6 mg/dL (ref 2.4–7.0)

## 2024-08-12 LAB — PROTIME-INR
INR: 1.2 ratio — ABNORMAL HIGH (ref 0.8–1.0)
Prothrombin Time: 12.5 s (ref 9.6–13.1)

## 2024-08-12 LAB — HIGH SENSITIVITY CRP: CRP, High Sensitivity: 0.45 mg/L (ref 0.000–5.000)

## 2024-08-12 LAB — HEMOGLOBIN A1C: Hgb A1c MFr Bld: 5.4 % (ref 4.6–6.5)

## 2024-08-12 MED ORDER — ASPIRIN 81 MG PO TBEC: 81.0000 mg | DELAYED_RELEASE_TABLET | Freq: Every day | ORAL | 3 refills | Status: AC

## 2024-08-12 MED ORDER — ASPIRIN 81 MG PO TBEC: 81.0000 mg | DELAYED_RELEASE_TABLET | Freq: Every day | ORAL | 3 refills | Status: DC

## 2024-08-12 NOTE — Patient Instructions (Signed)
  VISIT SUMMARY: Today, we reviewed your chronic medical conditions, including hypertension, hyperlipidemia, chronic kidney disease, hepatic cirrhosis, hypothyroidism, osteoporosis, gout, and your recent weight loss. We discussed your current medications and made plans for further monitoring and management.  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 -We will order blood tests including a complete metabolic panel, CBC, PT/INR, and AFP to assess your liver function and monitor for liver cancer.  ATHEROSCLEROSIS OF AORTA: You have mild aortic atherosclerosis, which increases your cardiovascular risk. -We will start you on a low-dose aspirin therapy to protect your heart. We discussed the risks and benefits, and it is safe for your liver function.  CHRONIC KIDNEY DISEASE, STAGE 3A: You have stage 3a chronic kidney disease, which requires regular monitoring. -We will order tests to check your kidney function, including GFR, urinalysis, and microalbumin creatinine ratio.  OSTEOPOROSIS: You have osteoporosis, which is complicated by your kidney disease and liver condition. -We will order tests to check your parathyroid  hormone (PTH) and vitamin D  levels.  VITAMIN D  DEFICIENCY: You have a vitamin D  deficiency that needs to be monitored. -We will order a test to check your vitamin D  levels.  HYPOTHYROIDISM: Your hypothyroidism is managed with levothyroxine , and we recently adjusted your dose due to weight loss. -We will order a TSH test with reflex to T4 to ensure your thyroid  condition is well controlled.  MIXED HYPERLIPIDEMIA: Your cholesterol levels are managed with rosuvastatin , and your last lipid panel was normal. -We will order a fasting lipid panel with ApoB, ApoA ratio, and high sensitivity CRP to monitor your cholesterol levels.  IDIOPATHIC CHRONIC  GOUT, MULTIPLE SITES, WITHOUT TOPHUS: Your gout is well managed with stable uric acid levels. -We will continue to monitor your uric acid levels.  ESSENTIAL HYPERTENSION: Your blood pressure is normal without medication after discontinuing nadolol . -Continue monitoring your blood pressure at home.  OVER-MOISTURIZED SKIN WITH PERSISTENT OILINESS: You have persistent oily skin after stopping the use of Lubiderm moisturizer. -We will refer you to a dermatologist for further evaluation.

## 2024-08-12 NOTE — Progress Notes (Incomplete)
 Assessment & Plan   Assessment/Plan:    Problem List Items Addressed This Visit   None       Assessment and Plan               There are no discontinued medications.  No follow-ups on file.        Subjective:   Encounter date: 08/12/2024  Rebekah Pace is a 68 y.o. female who has Schizoaffective disorder, bipolar type (HCC); Labile hypertension; Medication management; Hyperlipidemia, mixed; Vitamin D  deficiency; Bipolar affective disorder, depressed, severe, with psychotic behavior (HCC); Hypothyroidism; Obesity (BMI 30.0-34.9); History of hepatitis C, s/p curative treatment; Gout; Other cirrhosis of liver (HCC); Hematuria, microscopic; Abnormal glucose; Stage 3a chronic kidney disease (HCC); Aortic atherosclerosis (HCC) per CT 06/2020; Chronic cystitis; Coagulation defect; Resting tremor; Essential hypertension; Age-related osteoporosis without current pathological fracture; Gallbladder polyp; Metabolic dysfunction-associated steatohepatitis (MASH); and Primary insomnia on their problem list..   She  has a past medical history of Anxiety, Depression, Gout (05/17/2016), Hepatitis, Hypothyroidism, Labile hypertension, Mental disorder, Prediabetes, and Thyroid  disease..   She presents with chief complaint of Medical Management of Chronic Issues (6 month follow up. Pt is fasting today//HM due- vaccinations, mammogram and colonoscopy due 12/2025) .   Discussed the use of AI scribe software for clinical note transcription with the patient, who gave verbal consent to proceed.  History of Present Illness          Hypertension -  Previously managed with Nadolol  10 mg, reported not taking 07/05/2024.  - 9/22 her blood pressure was initially elevated at 142/60 mmHg, improved to 122/62 mmHg.   Hyperlipidemia  - Managed with Rosuvastatin  5 mg daily.  - Lipid panel in May 2025 was normal.   Aortic Atherosclerosis - Noted on 2021 CT.  - Managed with Rosuvastatin  5 mg daily.    Chronic Kidney Disease, stage 3a - Creatinine has been stable  Hepatitis-induced Cirrhosis  - Followed by Hepatology with continued monitoring for liver cancer, last seen 08/04/2024,  Hypothyroidism - Managed with Levothyroxine  50 mcg, which was decreased from 75 mcg on 02/17/2024. - Seen by Dr. Obadiah Birmingham on 06/09/2024.   Vitamin-D Deficiency  - Managed with Vitamin-D 5000 units BID - Levels have been stable  Osteoporosis  - Managed with Calcium  and Vitamin-D, complicated by her Cirrhosis and Chronic Kidney disease.   Schizoaffective Disorder, Bipolar - Followed by Dr. Dallas Schmitz with [facility], last seen on 04/05/2024.    ROS  Past Surgical History:  Procedure Laterality Date   arm fracture     COLONOSCOPY  20 + years ago    at [facility] per pt, normal exam   ROTATOR CUFF REPAIR      Current Outpatient Medications on File Prior to Visit  Medication Sig Dispense Refill   Ascorbic Acid (VITAMIN C PO) Take by mouth.     CALCIUM  PO Take by mouth.     Cyanocobalamin  (VITAMIN B-12 PO) Take by mouth.     levothyroxine  (SYNTHROID ) 50 MCG tablet Take 1 tablet (50 mcg total) by mouth daily before breakfast. 90 tablet 3   MAGNESIUM  PO Take by mouth.     Pyridoxine HCl (VITAMIN B6 PO) Take by mouth.     rosuvastatin  (CRESTOR ) 5 MG tablet TAKE 1 TABLET BY MOUTH DAILY ON AN EMPTY STOMACH WITH ONLY WATER FOR 30 MINUTES AND NO ANTACID MEDS, CALCIUM  OR MAGNESIUM  FOR 4 HOURS AND AVOID BIOTIN 90 tablet 0   VITAMIN D , CHOLECALCIFEROL, PO Take 5,000 Units by  mouth 2 (two) times daily.      nadolol  (CORGARD ) 20 MG tablet Take 10 mg by mouth. (Patient not taking: Reported on 08/12/2024)     No current facility-administered medications on file prior to visit.   Family History  Problem Relation Age of Onset   Hyperlipidemia Mother    Hypertension Mother    Colon polyps Mother    Colon cancer Mother 47   Cancer Father        Lung   Hyperlipidemia Brother    Esophageal cancer  Neg Hx    Rectal cancer Neg Hx    Stomach cancer Neg Hx     Social History   Socioeconomic History   Marital status: Divorced    Spouse name: Not on file   Number of children: Not on file   Years of education: Not on file   Highest education level: Not on file  Occupational History   Not on file  Tobacco Use   Smoking status: Never   Smokeless tobacco: Never  Vaping Use   Vaping status: Never Used  Substance and Sexual Activity   Alcohol use: No   Drug use: No   Sexual activity: Not Currently    Birth control/protection: None  Other Topics Concern   Not on file  Social History Narrative   Not on file   Social Drivers of Health   Financial Resource Strain: Low Risk  (07/05/2024)   Overall Financial Resource Strain (CARDIA)    Difficulty of Paying Living Expenses: Not hard at all  Food Insecurity: No Food Insecurity (07/05/2024)   Hunger Vital Sign    Worried About Running Out of Food in the Last Year: Never true    Ran Out of Food in the Last Year: Never true  Transportation Needs: No Transportation Needs (07/05/2024)   PRAPARE - Administrator, Civil Service (Medical): No    Lack of Transportation (Non-Medical): No  Physical Activity: Sufficiently Active (07/05/2024)   Exercise Vital Sign    Days of Exercise per Week: 6 days    Minutes of Exercise per Session: 40 min  Stress: No Stress Concern Present (07/05/2024)   [employer] of Occupational Health - Occupational Stress Questionnaire    Feeling of Stress: Not at all  Social Connections: Socially Isolated (07/05/2024)   Social Connection and Isolation Panel    Frequency of Communication with Friends and Family: Three times a week    Frequency of Social Gatherings with Friends and Family: Once a week    Attends Religious Services: Never    Database Administrator or Organizations: No    Attends Banker Meetings: Never    Marital Status: Divorced  Catering Manager Violence: Not At Risk  (07/05/2024)   Humiliation, Afraid, Rape, and Kick questionnaire    Fear of Current or Ex-Partner: No    Emotionally Abused: No    Physically Abused: No    Sexually Abused: No  Objective:  Physical Exam: BP 125/83 (BP Location: Left Arm, Patient Position: Sitting, Cuff Size: Large)   Pulse 80   Temp (!) 97 F (36.1 C) (Temporal)   Resp 18   Wt 119 lb 12.8 oz (54.3 kg)   SpO2 100%   BMI 20.56 kg/m    Physical Exam           Physical Exam  No results found.  Recent Results (from the past 2160 hours)  TSH + free T4     Status: None   Collection Time: 06/09/24  1:43 PM  Result Value Ref Range   TSH W/REFLEX TO FT4 1.07 0.40 - 4.50 mIU/L        Beverley KATHEE Hummer, MD  I,Emily Lagle,acting as a scribe for Beverley KATHEE Hummer, MD.,have documented all relevant documentation on the behalf of Beverley KATHEE Hummer, MD,as directed by  Beverley KATHEE Hummer, MD while in the presence of Beverley KATHEE Hummer, MD.   I, Beverley KATHEE Hummer, MD, have reviewed all documentation for this visit. The documentation on 08/12/2024 for the exam, diagnosis, procedures, and orders are all accurate and complete.

## 2024-08-12 NOTE — Telephone Encounter (Signed)
 Copied from CRM #[id number]. Topic: Clinical - Medication Question >> Aug 12, 2024 12:34 PM Charolett L wrote: Reason for CRM: [facility] is calling for clarity for medication levothyroxine  (SYNTHROID ) 50 MCG tablet  Pharmacist is calling for approval to switch to a different manufacturer  [phone]

## 2024-08-12 NOTE — Telephone Encounter (Signed)
 Spoke to pharmacy tech and confirmed that an manufacturer change is approved due to them no longer carrying. Pharmacy tech verbalized understanding.

## 2024-08-13 LAB — TSH RFX ON ABNORMAL TO FREE T4: TSH: 1.45 u[IU]/mL (ref 0.450–4.500)

## 2024-08-13 LAB — APO A1 + B + RATIO
Apolipo. B/A-1 Ratio: 0.4 ratio (ref 0.0–0.6)
Apolipoprotein A-1: 156 mg/dL (ref 116–209)
Apolipoprotein B: 59 mg/dL (ref <90)

## 2024-08-15 ENCOUNTER — Encounter: Payer: Self-pay | Admitting: Family Medicine

## 2024-08-15 NOTE — Progress Notes (Signed)
 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 Continued risk of liver cancer necessitates regular monitoring. - Order complete metabolic panel, CBC, PT/INR, and AFP to assess liver function and monitor for [facility].  Atherosclerosis of aorta Mild aortic atherosclerosis identified in 2021. Increased cardiovascular risk due to cirrhosis. - Initiate low-dose aspirin therapy for cardiovascular protection, discussing risks and benefits, including its safety for liver function.  Chronic kidney disease, stage 3a CKD stage 3a requiring regular renal function monitoring. - Order GFR, urinalysis, and microalbumin creatinine ratio.  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 - Order PTH and vitamin D  levels.  Vitamin D  deficiency Vitamin D  deficiency requiring monitoring in the context of osteoporosis and CKD. - Order vitamin D  levels.  Hypothyroidism Hypothyroidism managed with levothyroxine  50 mcg, with recent dose adjustment due to weight loss. - Order TSH with reflex to T4 to ensure adequate control.  Mixed hyperlipidemia Mixed hyperlipidemia managed with rosuvastatin  5 mg. Normal lipid panel in May. - Order fasting lipid panel with ApoB, ApoA ratio, and high sensitivity CRP.  Idiopathic chronic gout, multiple sites, without tophus Idiopathic gout with well-managed uric acid levels. - Monitor uric acid levels.  Essential hypertension Previously managed with nadolol , now normotensive without medication.  Over-moisturized skin with persistent oiliness Persistent oiliness after cessation of Lubiderm use. - Refer to dermatologist for further evaluation.       Medications Discontinued During This Encounter  Medication Reason   aspirin EC 81 MG tablet      Return in about 6 months  (around 02/10/2025) for fasting labs, blood pressure, cholesterol.        Subjective:   Encounter date: 08/12/2024  Chief Complaint  Patient presents with   Medical Management of Chronic Issues    6 month follow up. Pt is fasting today  HM due- vaccinations, mammogram and colonoscopy due 12/2025    Discussed the use of AI scribe software for clinical note transcription with the patient, who gave verbal consent to proceed.  History of Present Illness 02/12/2024  Rebekah Pace is a 68 year old female who presents for an annual physical exam.  She has a history of hypertension, previously managed with nadolol , which also addressed her essential tremor. Due to side effects, she was switched to propranolol  extended release 60 mg capsules, but she opted to continue with nadolol  as it was effective. Her blood pressure was mildly elevated at 142/80 mmHg during the visit.  Her hypothyroidism is managed with levothyroxine  75 mcg, and she reports no current symptoms of thyroid  dysfunction.  She is on rosuvastatin  5 mg for hyperlipidemia. Aortic atherosclerosis was incidentally identified on a CT scan in September 2021, and she continues treatment with statins. She is aware of the need for monitoring liver function due to statin use.  She takes 10,000 IU of vitamin D  daily for her vitamin D  deficiency, with ongoing monitoring of her levels.  She has a history of bipolar affective disorder versus schizoaffective disorder and is under psychiatric care.  She has stage 3A chronic kidney disease and a history of gout, though she reports no recent symptoms of gout and questions the diagnosis.  She has hepatitis-induced cirrhosis and has recently established care with hepatology, with ongoing monitoring of liver function and clotting factors.  She has age-related osteoporosis without current pathologic fracture and is considering further evaluation at  an osteoporosis clinic.  She reports  significant weight loss from 180 pounds to around 130 pounds, attributing it to lifestyle changes. She mentions a nodule on her abdomen, which is more noticeable after the weight loss.  08/12/2024  Hypertension  Previously managed with nadolol  10 mg; discontinued in August 2025 due to low blood pressure readings. Home BP averages 118/65 mmHg; HR ranges 68-73 bpm. No symptoms of hypotension reported. On 07/05/2024, BP was initially 142/60 mmHg, improved to 122/62 mmHg.  Hyperlipidemia & Aortic Atherosclerosis  Managed with rosuvastatin  5 mg daily. Lipid panel in May 2025 was normal. Mild aortic atherosclerosis noted on CT in 2021.  Chronic Kidney Disease  Stage 3a CKD; creatinine has remained stable.  Hepatic Cirrhosis (Hepatitis-Induced)  Followed by hepatology; significant improvement since 2016 with reduced scarring and fat content. Most recent hepatology evaluation on 08/04/2024; ongoing monitoring for liver cancer.  Hypothyroidism  Managed with levothyroxine  50 mcg (reduced from 75 mcg on 02/17/2024 due to weight loss). Seen by Dr. Obadiah Birmingham on 06/09/2024. Condition perceived as well controlled.  Osteoporosis & Vitamin D  Deficiency  Managed with calcium  and vitamin D  supplementation (Vitamin D  5000 units BID). Levels stable; osteoporosis management complicated by cirrhosis and CKD. Reports oily skin after overuse of Lubriderm moisturizer post-weight loss.  Schizoaffective Disorder, Bipolar Type  Followed by Dr. Dallas Schmitz [facility]); last visit on 04/05/2024.  Gout  Idiopathic gout; uric acid levels stable as of six months ago.  Colorectal Neoplasia Surveillance  Family history of colon cancer (mother deceased). History of colonic polyps removed three years ago. Colonoscopy scheduled for 2027.  Weight Loss & Physical Activity  Weight loss of 65 pounds. Ambulates two miles daily.       08/12/2024   11:30 AM 07/05/2024    1:53 PM 02/12/2024    10:50 AM 01/06/2024    2:35 PM 03/19/2023    3:01 PM  Depression screen PHQ 2/9  Decreased Interest 0 0 0 0 0  Down, Depressed, Hopeless 0 0 0 0 0  PHQ - 2 Score 0 0 0 0 0  Altered sleeping 0  0 0   Tired, decreased energy 0  0 0   Change in appetite 0  0 0   Feeling bad or failure about yourself  0  0 0   Trouble concentrating 0  0 0   Moving slowly or fidgety/restless 0  0 0   Suicidal thoughts 0  0 0   PHQ-9 Score 0  0 0   Difficult doing work/chores Not difficult at all  Not difficult at all Not difficult at all        08/12/2024   11:30 AM 02/12/2024   10:51 AM 01/06/2024    2:35 PM  GAD 7 : Generalized Anxiety Score  Nervous, Anxious, on Edge 0 0 0  Control/stop worrying 0 0 0  Worry too much - different things 0 0 0  Trouble relaxing 0 0 0  Restless 0 0 0  Easily annoyed or irritable 0 0 0  Afraid - awful might happen 0 0 0  Total GAD 7 Score 0 0 0  Anxiety Difficulty Not difficult at all Not difficult at all Not difficult at all    Health Maintenance Due  Topic Date Due   Influenza Vaccine  05/14/2024   Mammogram  06/11/2024   COVID-19 Vaccine (4 - 2025-26 season) 06/14/2024     PMH:  The following were reviewed and entered/updated in epic: Past Medical History:  Diagnosis Date   Anxiety    Depression    Gout 05/17/2016   Hepatitis    Hypothyroidism    Labile hypertension    Mental disorder    Prediabetes    Thyroid  disease     Patient Active Problem List   Diagnosis Date Noted   Gallbladder polyp 02/08/2024   Resting tremor 01/06/2024   Essential hypertension 01/06/2024   Age-related osteoporosis without current pathological fracture 01/06/2024   Coagulation defect 12/04/2022   Chronic cystitis 05/26/2021   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   Aortic atherosclerosis (HCC) per CT 06/2020 02/16/2021   Primary insomnia 11/07/2020   Stage 3a chronic kidney disease (HCC) 03/02/2020   Abnormal glucose 11/14/2019    Hematuria, microscopic 07/31/2018   Other cirrhosis of liver (HCC) 11/23/2016   Gout 05/17/2016   History of hepatitis C, s/p curative treatment 01/31/2016   Obesity (BMI 30.0-34.9) 07/24/2015   Hypothyroidism 12/13/2014   Hyperlipidemia, mixed 09/01/2014   Vitamin D  deficiency 09/01/2014   Bipolar affective disorder, depressed, severe, with psychotic behavior (HCC) 09/01/2014   Medication management 03/30/2014   Labile hypertension    Schizoaffective disorder, bipolar type (HCC) 09/12/2011    Past Surgical History:  Procedure Laterality Date   arm fracture     COLONOSCOPY  20 + years ago    at [facility] per pt, normal exam   ROTATOR CUFF REPAIR      Family History  Problem Relation Age of Onset   Hyperlipidemia Mother    Hypertension Mother    Colon polyps Mother    Colon cancer Mother 60   Cancer Father        Lung   Hyperlipidemia Brother    Esophageal cancer Neg Hx    Rectal cancer Neg Hx    Stomach cancer Neg Hx     Medications- reviewed and updated Outpatient Medications Prior to Visit  Medication Sig Dispense Refill   Ascorbic Acid (VITAMIN C PO) Take by mouth.     CALCIUM  PO Take by mouth.     Cyanocobalamin  (VITAMIN B-12 PO) Take by mouth.     levothyroxine  (SYNTHROID ) 50 MCG tablet Take 1 tablet (50 mcg total) by mouth daily before breakfast. 90 tablet 3   MAGNESIUM  PO Take by mouth.     Pyridoxine HCl (VITAMIN B6 PO) Take by mouth.     rosuvastatin  (CRESTOR ) 5 MG tablet TAKE 1 TABLET BY MOUTH DAILY ON AN EMPTY STOMACH WITH ONLY WATER FOR 30 MINUTES AND NO ANTACID MEDS, CALCIUM  OR MAGNESIUM  FOR 4 HOURS AND AVOID BIOTIN 90 tablet 0   VITAMIN D , CHOLECALCIFEROL, PO Take 5,000 Units by mouth 2 (two) times daily.      nadolol  (CORGARD ) 20 MG tablet Take 10 mg by mouth. (Patient not taking: Reported on 08/12/2024)     No facility-administered medications prior to visit.    Allergies  Allergen Reactions   Sulfa Antibiotics Rash    Social History    Socioeconomic History   Marital status: Divorced    Spouse name: Not on file   Number of children: Not on file   Years of education: Not on file   Highest education level: Not on file  Occupational History   Not on file  Tobacco Use   Smoking status: Never   Smokeless tobacco: Never  Vaping Use   Vaping status: Never Used  Substance and Sexual Activity   Alcohol use: No   Drug use: No   Sexual activity: Not Currently  Birth control/protection: None  Other Topics Concern   Not on file  Social History Narrative   Not on file   Social Drivers of Health   Financial Resource Strain: Low Risk  (07/05/2024)   Overall Financial Resource Strain (CARDIA)    Difficulty of Paying Living Expenses: Not hard at all  Food Insecurity: No Food Insecurity (07/05/2024)   Hunger Vital Sign    Worried About Running Out of Food in the Last Year: Never true    Ran Out of Food in the Last Year: Never true  Transportation Needs: No Transportation Needs (07/05/2024)   PRAPARE - Administrator, Civil Service (Medical): No    Lack of Transportation (Non-Medical): No  Physical Activity: Sufficiently Active (07/05/2024)   Exercise Vital Sign    Days of Exercise per Week: 6 days    Minutes of Exercise per Session: 40 min  Stress: No Stress Concern Present (07/05/2024)   [employer] of Occupational Health - Occupational Stress Questionnaire    Feeling of Stress: Not at all  Social Connections: Socially Isolated (07/05/2024)   Social Connection and Isolation Panel    Frequency of Communication with Friends and Family: Three times a week    Frequency of Social Gatherings with Friends and Family: Once a week    Attends Religious Services: Never    Database Administrator or Organizations: No    Attends Engineer, Structural: Never    Marital Status: Divorced           Objective:  Physical Exam: BP 125/83 (BP Location: Left Arm, Patient Position: Sitting, Cuff Size:  Large)   Pulse 80   Temp (!) 97 F (36.1 C) (Temporal)   Resp 18   Wt 119 lb 12.8 oz (54.3 kg)   SpO2 100%   BMI 20.56 kg/m   Body mass index is 20.56 kg/m. Wt Readings from Last 3 Encounters:  08/12/24 119 lb 12.8 oz (54.3 kg)  07/05/24 125 lb 6.4 oz (56.9 kg)  06/09/24 125 lb 12.8 oz (57.1 kg)   Physical Exam Constitutional:      General: She is not in acute distress.    Appearance: Normal appearance. She is not ill-appearing or toxic-appearing.  HENT:     Head: Normocephalic and atraumatic.     Right Ear: Hearing, tympanic membrane, ear canal and external ear normal. There is no impacted cerumen.     Left Ear: Hearing, tympanic membrane, ear canal and external ear normal. There is no impacted cerumen.     Nose: Nose normal. No congestion.     Mouth/Throat:     Lips: No lesions.     Mouth: Mucous membranes are moist.     Pharynx: Oropharynx is clear. No oropharyngeal exudate.  Eyes:     General: No scleral icterus.       Right eye: No discharge.        Left eye: No discharge.     Conjunctiva/sclera: Conjunctivae normal.     Pupils: Pupils are equal, round, and reactive to light.  Neck:     Thyroid : No thyroid  mass, thyromegaly or thyroid  tenderness.  Cardiovascular:     Rate and Rhythm: Normal rate and regular rhythm.     Pulses: Normal pulses.     Heart sounds: Normal heart sounds.  Pulmonary:     Effort: Pulmonary effort is normal. No respiratory distress.     Breath sounds: Normal breath sounds.  Abdominal:     General: Abdomen  is flat. Bowel sounds are normal.     Palpations: Abdomen is soft.  Musculoskeletal:        General: Normal range of motion.     Cervical back: Normal range of motion.     Right lower leg: No edema.     Left lower leg: No edema.  Lymphadenopathy:     Cervical: No cervical adenopathy.  Skin:    General: Skin is warm and dry.     Findings: No rash.  Neurological:     General: No focal deficit present.     Mental Status: She is  alert and oriented to person, place, and time. Mental status is at baseline.     Deep Tendon Reflexes:     Reflex Scores:      Patellar reflexes are 2+ on the right side and 2+ on the left side. Psychiatric:        Mood and Affect: Mood normal.        Behavior: Behavior normal.        Thought Content: Thought content normal.        Judgment: Judgment normal.         Prior labs:   Recent Results (from the past 2160 hours)  TSH + free T4     Status: None   Collection Time: 06/09/24  1:43 PM  Result Value Ref Range   TSH W/REFLEX TO FT4 1.07 0.40 - 4.50 mIU/L  APTT     Status: None   Collection Time: 08/12/24 11:30 AM  Result Value Ref Range   aPTT 31.6 25.4 - 36.8 SEC  PTH, intact (no Ca)     Status: None   Collection Time: 08/12/24 11:30 AM  Result Value Ref Range   PTH 16 16 - 77 pg/mL    Comment: . Interpretive Guide    Intact PTH           Calcium  ------------------    ----------           ------- Normal Parathyroid     Normal               Normal Hypoparathyroidism    Low or Low Normal    Low Hyperparathyroidism    Primary            Normal or High       High    Secondary          High                 Normal or Low    Tertiary           High                 High Non-Parathyroid     Hypercalcemia      Low or Low Normal    High .   Uric acid     Status: None   Collection Time: 08/12/24 11:30 AM  Result Value Ref Range   Uric Acid, Serum 5.6 2.4 - 7.0 mg/dL  Comprehensive metabolic panel with GFR     Status: Abnormal   Collection Time: 08/12/24 11:30 AM  Result Value Ref Range   Sodium 135 135 - 145 mEq/L   Potassium 3.6 3.5 - 5.1 mEq/L   Chloride 99 96 - 112 mEq/L   CO2 27 19 - 32 mEq/L    Comment: Elevated LDH levels may cause falsely increased CO2 results. If LDH is >2000 U/L, a positive bias of 12%  is possible.   Glucose, Bld 96 70 - 99 mg/dL   BUN 18 6 - 23 mg/dL   Creatinine, Ser 9.02 0.40 - 1.20 mg/dL   Total Bilirubin 0.8 0.2 - 1.2 mg/dL   Alkaline  Phosphatase 78 39 - 117 U/L   AST 27 0 - 37 U/L   ALT 17 0 - 35 U/L   Total Protein 8.0 6.0 - 8.3 g/dL   Albumin 5.1 3.5 - 5.2 g/dL   GFR 40.25 (L) >39.99 mL/min    Comment: Calculated using the CKD-EPI Creatinine Equation (2021)   Calcium  9.9 8.4 - 10.5 mg/dL  CBC with Differential/Platelet     Status: None   Collection Time: 08/12/24 11:30 AM  Result Value Ref Range   WBC 4.2 4.0 - 10.5 K/uL   RBC 4.46 3.87 - 5.11 Mil/uL   Hemoglobin 13.3 12.0 - 15.0 g/dL   HCT 60.8 63.9 - 53.9 %   MCV 87.5 78.0 - 100.0 fl   MCHC 34.1 30.0 - 36.0 g/dL   RDW 86.6 88.4 - 84.4 %   Platelets 227.0 150.0 - 400.0 K/uL   Neutrophils Relative % 60.2 43.0 - 77.0 %   Lymphocytes Relative 25.8 12.0 - 46.0 %   Monocytes Relative 9.1 3.0 - 12.0 %   Eosinophils Relative 3.2 0.0 - 5.0 %   Basophils Relative 1.7 0.0 - 3.0 %   Neutro Abs 2.6 1.4 - 7.7 K/uL   Lymphs Abs 1.1 0.7 - 4.0 K/uL   Monocytes Absolute 0.4 0.1 - 1.0 K/uL   Eosinophils Absolute 0.1 0.0 - 0.7 K/uL   Basophils Absolute 0.1 0.0 - 0.1 K/uL  Urinalysis, Routine w reflex microscopic     Status: Abnormal   Collection Time: 08/12/24 11:30 AM  Result Value Ref Range   Color, Urine YELLOW Yellow;Lt. Yellow;Straw;Dark Yellow;Amber;Green;Red;Brown   APPearance CLEAR Clear;Turbid;Slightly Cloudy;Cloudy   Specific Gravity, Urine <=1.005 (A) 1.000 - 1.030   pH 6.0 5.0 - 8.0   Total Protein, Urine NEGATIVE Negative   Urine Glucose NEGATIVE Negative   Ketones, ur NEGATIVE Negative   Bilirubin Urine NEGATIVE Negative   Hgb urine dipstick SMALL (A) Negative   Urobilinogen, UA 0.2 0.0 - 1.0   Leukocytes,Ua NEGATIVE Negative   Nitrite NEGATIVE Negative   WBC, UA 0-2/hpf 0-2/hpf   RBC / HPF 0-2/hpf 0-2/hpf   Squamous Epithelial / HPF Rare(0-4/hpf) Rare(0-4/hpf)  Microalbumin / creatinine urine ratio     Status: None   Collection Time: 08/12/24 11:30 AM  Result Value Ref Range   Microalb, Ur <0.7 mg/dL   Creatinine,U 63.2 mg/dL   Microalb Creat  Ratio Unable to calculate 0.0 - 30.0 mg/g    Comment: Unable to Calculate due to Microalbumin Result of <0.7 mg/dL  Hemoglobin J8r     Status: None   Collection Time: 08/12/24 11:30 AM  Result Value Ref Range   Hgb A1c MFr Bld 5.4 4.6 - 6.5 %    Comment: Glycemic Control Guidelines for People with Diabetes:Non Diabetic:  <6%Goal of Therapy: <7%Additional Action Suggested:  >8%   Lipid panel     Status: None   Collection Time: 08/12/24 11:30 AM  Result Value Ref Range   Cholesterol 133 0 - 200 mg/dL    Comment: ATP III Classification       Desirable:  < 200 mg/dL               Borderline High:  200 - 239 mg/dL  High:  > = 240 mg/dL   Triglycerides 37.9 0.0 - 149.0 mg/dL    Comment: Normal:  <849 mg/dLBorderline High:  150 - 199 mg/dL   HDL 36.19 >60.99 mg/dL   VLDL 87.5 0.0 - 59.9 mg/dL   LDL Cholesterol 56 0 - 99 mg/dL   Total CHOL/HDL Ratio 2     Comment:                Men          Women1/2 Average Risk     3.4          3.3Average Risk          5.0          4.42X Average Risk          9.6          7.13X Average Risk          15.0          11.0                       NonHDL 68.74     Comment: NOTE:  Non-HDL goal should be 30 mg/dL higher than patient's LDL goal (i.e. LDL goal of < 70 mg/dL, would have non-HDL goal of < 100 mg/dL)  Protime-INR     Status: Abnormal   Collection Time: 08/12/24 11:30 AM  Result Value Ref Range   INR 1.2 (H) 0.8 - 1.0 ratio   Prothrombin Time 12.5 9.6 - 13.1 sec  Insulin , random     Status: None   Collection Time: 08/12/24 11:30 AM  Result Value Ref Range   Insulin  4.9 uIU/mL    Comment:       Reference Range  < or = 18.4 .       Risk:       Optimal          < or = 18.4       Moderate         NA       High             >18.4 .       Adult cardiovascular event risk category       cut points (optimal, moderate, high)       are based on Insulin  Reference Interval       studies performed at [facility]       in 2022. .   C-peptide      Status: None   Collection Time: 08/12/24 11:30 AM  Result Value Ref Range   C-Peptide 1.54 0.80 - 3.85 ng/mL  Apo A1 + B + Ratio     Status: None   Collection Time: 08/12/24 11:30 AM  Result Value Ref Range   Apolipoprotein A-1 156 116 - 209 mg/dL   Apolipoprotein B 59 <09 mg/dL    Comment:                          Desirable               < 90                          Borderline High     90 -  99  High               100 - 130                          Very High               >130      --------------------------------------------------           ASCVD RISK              THERAPEUTIC TARGET            CATEGORY                  APO B (mg/dL)         Very High Risk        <80 (if extreme risk <70)         High Risk             <90         Moderate Risk         <90    Apolipo. B/A-1 Ratio 0.4 0.0 - 0.6 ratio    Comment:                              Apolipoprotein B/A-1 Ratio                                            Female  Female                                   Avg.Risk  0.7   0.6                                2X Avg.Risk  0.9   0.9                                3X Avg.Risk  1.0   1.0   CRP High sensitivity     Status: None   Collection Time: 08/12/24 11:30 AM  Result Value Ref Range   CRP, High Sensitivity 0.450 0.000 - 5.000 mg/L    Comment: Note:  An elevated hs-CRP (>5 mg/L) should be repeated after 2 weeks to rule out recent infection or trauma.  TSH Rfx on Abnormal to Free T4     Status: None   Collection Time: 08/12/24 11:30 AM  Result Value Ref Range   TSH 1.450 0.450 - 4.500 uIU/mL    Lab Results  Component Value Date   CHOL 133 08/12/2024   CHOL 138 02/12/2024   CHOL 80 08/19/2023   Lab Results  Component Value Date   HDL 63.80 08/12/2024   HDL 48.00 02/12/2024   HDL 34 (L) 08/19/2023   Lab Results  Component Value Date   LDLCALC 56 08/12/2024   LDLCALC 69 02/12/2024   LDLCALC 28 08/19/2023   Lab Results  Component Value Date    TRIG 62.0 08/12/2024   TRIG 106.0 02/12/2024   TRIG 93 08/19/2023   Lab Results  Component Value Date   CHOLHDL 2 08/12/2024  CHOLHDL 3 02/12/2024   CHOLHDL 2.4 08/19/2023   No results found for: LDLDIRECT  Last metabolic panel Lab Results  Component Value Date   GLUCOSE 96 08/12/2024   NA 135 08/12/2024   K 3.6 08/12/2024   CL 99 08/12/2024   CO2 27 08/12/2024   BUN 18 08/12/2024   CREATININE 0.97 08/12/2024   EGFR 65 08/19/2023   CALCIUM  9.9 08/12/2024   PHOS 3.4 02/04/2016   PROT 8.0 08/12/2024   ALBUMIN 5.1 08/12/2024   BILITOT 0.8 08/12/2024   ALKPHOS 78 08/12/2024   AST 27 08/12/2024   ALT 17 08/12/2024   ANIONGAP 7 01/28/2017    Lab Results  Component Value Date   HGBA1C 5.4 08/12/2024    Last CBC Lab Results  Component Value Date   WBC 4.2 08/12/2024   HGB 13.3 08/12/2024   HCT 39.1 08/12/2024   MCV 87.5 08/12/2024   MCH 29.7 08/19/2023   RDW 13.3 08/12/2024   PLT 227.0 08/12/2024    Lab Results  Component Value Date   TSH 1.450 08/12/2024      Last vitamin D  Lab Results  Component Value Date   VD25OH 87 12/04/2022    Lab Results  Component Value Date   BILIRUBINUR NEGATIVE 08/12/2024   PROTEINUR NEGATIVE 12/04/2022   UROBILINOGEN 0.2 08/12/2024   LEUKOCYTESUR NEGATIVE 08/12/2024    Lab Results  Component Value Date   MICROALBUR <0.7 08/12/2024   MICROALBUR <0.7 02/12/2024     At today's visit, we discussed treatment options, associated risk and benefits, and engage in counseling as needed.  Additionally the following were reviewed: Past medical records, past medical and surgical history, family and social background, as well as relevant laboratory results, imaging findings, and specialty notes, where applicable.  This message was generated using dictation software, and as a result, it may contain unintentional typos or errors.  Nevertheless, extensive effort was made to accurately convey at the pertinent aspects of the  patient visit.    There may have been are other unrelated non-urgent complaints, but due to the busy schedule and the amount of time already spent with her, time does not permit to address these issues at today's visit. Another appointment may have or has been requested to review these additional issues.     Arvella Hummer, MD, MS

## 2024-08-17 LAB — C-PEPTIDE: C-Peptide: 1.54 ng/mL (ref 0.80–3.85)

## 2024-08-17 LAB — VITAMIN D 1,25 DIHYDROXY
Vitamin D 1, 25 (OH)2 Total: 60 pg/mL (ref 18–72)
Vitamin D2 1, 25 (OH)2: <8 pg/mL
Vitamin D3 1, 25 (OH)2: 60 pg/mL

## 2024-08-17 LAB — AFP TUMOR MARKER: AFP-Tumor Marker: 8.6 ng/mL — ABNORMAL HIGH

## 2024-08-17 LAB — PARATHYROID HORMONE, INTACT (NO CA): PTH: 16 pg/mL (ref 16–77)

## 2024-08-17 LAB — INSULIN, RANDOM: Insulin: 4.9 u[IU]/mL

## 2024-08-20 ENCOUNTER — Ambulatory Visit: Payer: Self-pay | Admitting: Family Medicine

## 2024-08-20 NOTE — Telephone Encounter (Signed)
 Noted

## 2024-08-20 NOTE — Telephone Encounter (Signed)
 Copied from CRM #[id number]. Topic: Clinical - Lab/Test Results >> Aug 20, 2024  4:29 PM Rebekah Pace wrote: Reason for CRM: Patient called in regarding lab results, relay results. Patient stated she understood and has no further questions

## 2024-08-30 ENCOUNTER — Ambulatory Visit
Admission: RE | Admit: 2024-08-30 | Discharge: 2024-08-30 | Disposition: A | Source: Ambulatory Visit | Attending: Nurse Practitioner

## 2024-08-30 DIAGNOSIS — K7402 Hepatic fibrosis, advanced fibrosis: Secondary | ICD-10-CM

## 2024-08-30 DIAGNOSIS — K8020 Calculus of gallbladder without cholecystitis without obstruction: Secondary | ICD-10-CM | POA: Diagnosis not present

## 2024-08-30 DIAGNOSIS — K7581 Nonalcoholic steatohepatitis (NASH): Secondary | ICD-10-CM

## 2024-08-31 DIAGNOSIS — D1801 Hemangioma of skin and subcutaneous tissue: Secondary | ICD-10-CM | POA: Diagnosis not present

## 2024-08-31 DIAGNOSIS — L814 Other melanin hyperpigmentation: Secondary | ICD-10-CM | POA: Diagnosis not present

## 2024-08-31 DIAGNOSIS — L570 Actinic keratosis: Secondary | ICD-10-CM | POA: Diagnosis not present

## 2024-08-31 DIAGNOSIS — L821 Other seborrheic keratosis: Secondary | ICD-10-CM | POA: Diagnosis not present

## 2024-09-11 ENCOUNTER — Other Ambulatory Visit: Payer: Self-pay | Admitting: Family Medicine

## 2024-09-11 DIAGNOSIS — E782 Mixed hyperlipidemia: Secondary | ICD-10-CM

## 2024-09-12 ENCOUNTER — Other Ambulatory Visit: Payer: Self-pay | Admitting: Family Medicine

## 2024-09-12 DIAGNOSIS — E782 Mixed hyperlipidemia: Secondary | ICD-10-CM

## 2024-09-27 ENCOUNTER — Inpatient Hospital Stay
Admission: RE | Admit: 2024-09-27 | Discharge: 2024-09-27 | Disposition: A | Source: Ambulatory Visit | Attending: Family Medicine

## 2024-09-27 DIAGNOSIS — Z1231 Encounter for screening mammogram for malignant neoplasm of breast: Secondary | ICD-10-CM

## 2024-10-04 ENCOUNTER — Other Ambulatory Visit

## 2024-10-11 ENCOUNTER — Ambulatory Visit: Admitting: "Endocrinology

## 2025-02-10 ENCOUNTER — Ambulatory Visit: Admitting: Family Medicine

## 2025-07-11 ENCOUNTER — Ambulatory Visit
